# Patient Record
Sex: Male | Born: 1960 | Race: Black or African American | Hispanic: No | Marital: Single
Health system: Midwestern US, Community
[De-identification: ages and names within clinical notes are randomized; demographics above are authoritative.]

## PROBLEM LIST (undated history)

## (undated) DIAGNOSIS — M1990 Unspecified osteoarthritis, unspecified site: Secondary | ICD-10-CM

## (undated) DIAGNOSIS — T7840XA Allergy, unspecified, initial encounter: Secondary | ICD-10-CM

## (undated) DIAGNOSIS — K219 Gastro-esophageal reflux disease without esophagitis: Secondary | ICD-10-CM

## (undated) DIAGNOSIS — I10 Essential (primary) hypertension: Secondary | ICD-10-CM

## (undated) DIAGNOSIS — R1030 Lower abdominal pain, unspecified: Secondary | ICD-10-CM

## (undated) DIAGNOSIS — B1920 Unspecified viral hepatitis C without hepatic coma: Secondary | ICD-10-CM

## (undated) DIAGNOSIS — G8929 Other chronic pain: Secondary | ICD-10-CM

## (undated) DIAGNOSIS — R1011 Right upper quadrant pain: Secondary | ICD-10-CM

## (undated) HISTORY — PX: COLONOSCOPY: SHX174

## (undated) HISTORY — DX: Gastro-esophageal reflux disease without esophagitis: K21.9

## (undated) HISTORY — PX: TESTICLE SURGERY: SHX794

## (undated) HISTORY — DX: Allergy, unspecified, initial encounter: T78.40XA

## (undated) HISTORY — PX: TONSILLECTOMY: SUR1361

## (undated) HISTORY — DX: Essential (primary) hypertension: I10

## (undated) HISTORY — PX: SHOULDER SURGERY: SHX246

## (undated) MED ORDER — CLINDAMYCIN 300 MG CAP
300 mg | ORAL_CAPSULE | Freq: Three times a day (TID) | ORAL | Status: AC
Start: ? — End: 2012-10-28

## (undated) MED ORDER — LISINOPRIL 20 MG TAB: 20 mg | ORAL_TABLET | Freq: Every day | ORAL | Status: AC

## (undated) MED ORDER — DOCUSATE SODIUM 100 MG CAP
100 mg | ORAL_CAPSULE | Freq: Two times a day (BID) | ORAL | Status: AC
Start: ? — End: 2012-08-25

## (undated) MED ORDER — ACETAMINOPHEN 325 MG TABLET: 325 mg | ORAL_TABLET | ORAL | Status: AC | PRN

## (undated) MED ORDER — OXYCODONE 5 MG TAB: 5 mg | ORAL_TABLET | ORAL | Status: AC | PRN

## (undated) MED ORDER — OXYCODONE-ACETAMINOPHEN 5 MG-325 MG TAB: 5-325 mg | ORAL_TABLET | ORAL | Status: AC | PRN

## (undated) MED ORDER — LANSOPRAZOLE 30 MG CAP, DELAYED RELEASE: 30 mg | ORAL_CAPSULE | Freq: Every day | ORAL | Status: AC

## (undated) MED ORDER — LINEZOLID 600 MG TAB
600 mg | ORAL_TABLET | Freq: Two times a day (BID) | ORAL | Status: AC
Start: ? — End: 2012-06-24

---

## 2012-05-26 LAB — CBC WITH AUTOMATED DIFF
ABS. BASOPHILS: 0 10*3/uL (ref 0.0–0.2)
ABS. EOSINOPHILS: 0.1 10*3/uL (ref 0.0–0.7)
ABS. LYMPHOCYTES: 1.7 10*3/uL (ref 1.2–3.4)
ABS. MONOCYTES: 0.7 10*3/uL — ABNORMAL LOW (ref 1.1–3.2)
ABS. NEUTROPHILS: 5.6 10*3/uL (ref 1.4–6.5)
BASOPHILS: 0 % (ref 0–2)
EOSINOPHILS: 1 % (ref 0–5)
HCT: 39.5 % (ref 36.8–45.2)
HGB: 13.3 g/dL (ref 12.8–15.0)
IMMATURE GRANULOCYTES: 0.1 % (ref 0.0–5.0)
LYMPHOCYTES: 21 % (ref 16–40)
MCH: 32.3 PG — ABNORMAL HIGH (ref 27–31)
MCHC: 33.7 g/dL (ref 32–36)
MCV: 95.9 FL (ref 81–99)
MONOCYTES: 8 % (ref 0–12)
MPV: 12.2 FL — ABNORMAL HIGH (ref 7.4–10.4)
NEUTROPHILS: 70 % (ref 40–70)
PLATELET: 122 10*3/uL — ABNORMAL LOW (ref 140–450)
RBC: 4.12 M/uL (ref 4.0–5.2)
RDW: 13.8 % (ref 11.5–14.5)
WBC: 8.1 10*3/uL (ref 4.8–10.8)

## 2012-05-26 LAB — METABOLIC PANEL, COMPREHENSIVE
A-G Ratio: 0.5 — ABNORMAL LOW (ref 1.0–3.1)
ALT (SGPT): 115 U/L — ABNORMAL HIGH (ref 12.0–78.0)
AST (SGOT): 153 U/L — ABNORMAL HIGH (ref 15–37)
Albumin: 2.8 g/dL — ABNORMAL LOW (ref 3.40–5.00)
Alk. phosphatase: 143 U/L — ABNORMAL HIGH (ref 50–136)
Anion gap: 15 mmol/L (ref 10–17)
BUN/Creatinine ratio: 15 (ref 6.0–20.0)
BUN: 17 MG/DL (ref 7–18)
Bilirubin, total: 1.2 MG/DL — ABNORMAL HIGH (ref 0.20–1.00)
CO2: 26 mmol/L (ref 21–32)
Calcium: 9.1 MG/DL (ref 8.5–10.1)
Chloride: 104 mmol/L (ref 98–107)
Creatinine: 1.1 MG/DL (ref 0.6–1.3)
GFR est AA: >60 mL/min/{1.73_m2} (ref >60)
GFR est non-AA: >60 mL/min/{1.73_m2} (ref >60)
Globulin: 5.8 g/dL
Glucose: 123 mg/dL — ABNORMAL HIGH (ref 74–106)
Potassium: 4.6 mmol/L (ref 3.50–5.10)
Protein, total: 8.6 g/dL — ABNORMAL HIGH (ref 6.40–8.20)
Sodium: 140 mmol/L (ref 136–145)

## 2012-05-26 LAB — DRUG SCREEN, URINE
AMPHETAMINES: NEGATIVE
BARBITURATES: NEGATIVE
BENZODIAZEPINES: NEGATIVE
COCAINE: POSITIVE — AB
METHADONE: NEGATIVE
OPIATES: POSITIVE — AB
PCP(PHENCYCLIDINE): NEGATIVE
THC (TH-CANNABINOL): NEGATIVE
TRICYCLICS: NEGATIVE

## 2012-05-26 LAB — SED RATE, AUTOMATED: Sed rate, automated: 50 mm/hr

## 2012-05-26 LAB — GLUCOSE, POC: Glucose (POC): 149 mg/dL — ABNORMAL HIGH (ref 74–106)

## 2012-05-26 MED ADMIN — oxyCODONE IR (ROXICODONE) tablet 5 mg: ORAL | @ 23:00:00 | NDC 00406055223

## 2012-05-26 MED ADMIN — ceFAZolin (ANCEF) 1 g in 0.9% sodium chloride (MBP/ADV) 50 mL MBP: INTRAVENOUS | NDC 63323023710

## 2012-05-26 MED ADMIN — ketorolac (TORADOL) injection 30 mg: INTRAVENOUS | @ 23:00:00 | NDC 00409379501

## 2012-05-26 MED ADMIN — HYDROcodone-acetaminophen (NORCO) 5-325 mg per tablet 1 Tab: ORAL | @ 22:00:00 | NDC 63739053210

## 2012-05-26 MED ADMIN — sodium chloride (NS) flush 5-10 mL: INTRAVENOUS | @ 20:00:00 | NDC 87701099893

## 2012-05-26 NOTE — ED Provider Notes (Signed)
Patient is a 52 y.o. male presenting with back pain. The history is provided by the patient and medical records.   Back Pain   This is a chronic problem. Episode onset: worsened today while running from the police. The pain is associated with no known injury. The pain is present in the lower back. The quality of the pain is described as sharp. The pain radiates to the right thigh. The pain is at a severity of 10/10. The pain is severe. Pertinent negatives include no chest pain, no fever, no headaches, no abdominal pain, no dysuria and no weakness.    Pt was brought in by the police as he complained of back pain after he was running from them after he was allegedly caught stealing. Pt states that he "can't walk" despite multiple witnesses of him running. He ran out of percocet and is using IV heroin. He did not take his meds today    Past Medical History   Diagnosis Date   ??? Hypertension    ??? Diabetes    ??? Other ill-defined conditions      osteomyelitis        History reviewed. No pertinent past surgical history.      Family History   Problem Relation Age of Onset   ??? Diabetes Other    ??? Hypertension Other         History     Social History   ??? Marital Status: SINGLE     Spouse Name: N/A     Number of Children: N/A   ??? Years of Education: N/A     Occupational History   ??? Not on file.     Social History Main Topics   ??? Smoking status: Current Every Day Smoker   ??? Smokeless tobacco: Never Used   ??? Alcohol Use: Yes   ??? Drug Use: Yes     Special: Cocaine, Heroin      Comment: IVDA   ??? Sexually Active: No     Other Topics Concern   ??? Not on file     Social History Narrative   ??? No narrative on file                  ALLERGIES: Review of patient's allergies indicates not on file.      Review of Systems   Constitutional: Negative for fever, diaphoresis and appetite change (good appetite - pt requested food).   HENT: Negative.  Negative for congestion, sore throat and rhinorrhea.    Respiratory: Negative.  Negative for cough,  chest tightness and shortness of breath.    Cardiovascular: Negative.  Negative for chest pain and leg swelling.   Gastrointestinal: Positive for vomiting (once this AM). Negative for nausea, abdominal pain and diarrhea.   Genitourinary: Negative.  Negative for dysuria and difficulty urinating.   Musculoskeletal: Positive for back pain. Negative for joint swelling.   Skin: Negative.  Negative for rash and wound.   Allergic/Immunologic: Positive for immunocompromised state (hep C). Negative for food allergies.   Neurological: Negative.  Negative for weakness and headaches.       Filed Vitals:    05/26/12 1500 05/26/12 1830   BP: 160/87 147/84   Pulse: 67 71   Temp: 98 ??F (36.7 ??C) 98.3 ??F (36.8 ??C)   Resp: 20 20   Height: 6' (1.829 m)    Weight: 90.719 kg (200 lb)    SpO2: 99% 100%  Physical Exam   Nursing note and vitals reviewed.  Constitutional: He is oriented to person, place, and time. He appears well-developed and well-nourished. No distress.   HENT:   Head: Normocephalic and atraumatic.   Mouth/Throat: Oropharynx is clear and moist.   Eyes: Conjunctivae are normal. Pupils are equal, round, and reactive to light.   Neck: Normal range of motion. Neck supple.   Cardiovascular: Normal rate, regular rhythm and normal heart sounds.  Exam reveals no gallop and no friction rub.    No murmur heard.  Pulmonary/Chest: Effort normal and breath sounds normal. No respiratory distress. He has no wheezes. He has no rales. He exhibits no tenderness.   Abdominal: Soft. He exhibits no distension and no mass. There is no tenderness. There is no rebound and no guarding.   + lower back tenderness, diffuse mostly to the Left side of the spine . No swelling   Musculoskeletal: Normal range of motion. He exhibits no edema and no tenderness.   Neurological: He is alert and oriented to person, place, and time. He has normal strength. No cranial nerve deficit.   Power 5/5 lower extremities.  No decreased sensation  Patellar  reflexes +1 bilaterally (symmetrical)  Dorsiflexion of big toe intact bilaterally     Skin: Skin is warm and dry. No rash noted.        MDM     Differential Diagnosis; Clinical Impression; Plan:     Records reviewed from [facility]: Pt has had multiple admissions to [facility] and [facility] for osteomyeilits L4-5 and MSSA bacteremia, who would frequently leave AMA, elope or be  Administratively DCed due to his behaviors. He was finally DCed from [facility] on Zyvox (which they gave him a full 6 week supply of)  Last Bld Cx is 1/30 which shows S Aureus x2, susceptible to cefazolin among other ABx. An MRI shows improving lucency and increased segment over L4-5  (osteo and a possible paraspinal abscess). Last ESR was 26. Last few Bld Cx were negative.  (a copy of all these records is in the paper chart)  Pt states that he doesn't know where the meds are now. He did not follow up with [facility] as he was supposed to last week. The patient needs IV Abx and to have an arrangement for outpatient management. He has no acute neurological deficit that necessitates an emergent MRI  Case was discussed with Dr Dwyane Dee at 18:40 PM and he advises to start cefazolin 1g IV q8h.     19:05 Pm Dr Doneta Public was contacted - pt for admission    Labs Reviewed  CBC WITH AUTOMATED DIFF - Abnormal; Notable for the following:      MCH                           32.3 (*)               PLATELET                      122 (*)                MPV                           12.2 (*)               ABS. MONOCYTES  0.7 (*)             All other components within normal limits  METABOLIC PANEL, COMPREHENSIVE - Abnormal; Notable for the following:      Glucose                       123 (*)                Bilirubin, total              1.2 (*)                ALT                           115 (*)                AST                           153 (*)                Alk. phosphatase              143 (*)                Protein, total                8.6 (*)                 Albumin                       2.8 (*)                A-G Ratio                     0.5 (*)             All other components within normal limits  DRUG SCREEN, URINE - Abnormal; Notable for the following:      COCAINE                       POSITIVE (*)               OPIATES                       POSITIVE (*)            All other components within normal limits  GLUCOSE, POC - Abnormal; Notable for the following:      Glucose (POC)                 149 (*)             All other components within normal limits  CULTURE, BLOOD  CULTURE, BLOOD  CULTURE, BLOOD, PAIRED  SED RATE, AUTOMATED  C REACTIVE PROTEIN, QT  ETHYL ALCOHOL  ETHYL ALCOHOL          Procedures

## 2012-05-26 NOTE — H&P (Addendum)
History & Physical    Primary Care Provider: None  Source of Information:     History of Presenting Illness:     Walter Cox is a 52 y.o. 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 Patient treated at [facility] and at [facility] for the same complaints. He was D/C on Zyvox which was not filled. Today patient was placed under arrest for stealing. During the arrest patient C/O back pain and admitted to not getting his antibiotics filled. Patient also states that he is non-compliant with all his medications.Denies fever and chills, nausea or vomiting. 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     Review of Systems:  Further 10 point review of systems is benign.  A comprehensive review of systems was negative except for that written in the History of Present Illness.     Past Medical History   Diagnosis Date   ??? Hypertension    ??? Diabetes    ??? Other ill-defined conditions      osteomyelitis      History reviewed. No pertinent past surgical history.  Prior to Admission medications    Not on File     Not on File   Family History   Problem Relation Age of Onset   ??? Diabetes Other    ??? Hypertension Other         SOCIAL HISTORY:  History     Social History   ??? Marital Status: SINGLE     Spouse Name: N/A     Number of Children: N/A   ??? Years of Education: N/A     Social History Main Topics   ??? Smoking status: Current Every Day Smoker   ??? Smokeless tobacco: Never Used   ??? Alcohol Use: Yes   ??? Drug Use: Yes     Special: Cocaine, Heroin      Comment: IVDA   ??? Sexually Active: No     Other Topics Concern   ??? Not on file     Social History Narrative   ??? No narrative on file         Objective:       Physical Exam:     BP 147/84   Pulse 71   Temp(Src) 98.3 ??F (36.8 ??C)   Resp 20   Ht 6' (1.829 m)   Wt 90.719 kg (200 lb)   BMI 27.12 kg/m2   SpO2 100%   O2 Device: Room air    General:  Alert, cooperative, no distress, appears stated age.   Head:  Normocephalic, without obvious abnormality,  atraumatic.   Eyes:  Conjunctivae/corneas clear. PERRL, EOMs intact. Fundi benign   Ears:  Normal TMs and external ear canals both ears.   Nose: Nares normal. Septum midline. Mucosa normal. No drainage or sinus tenderness.   Throat: Lips, mucosa, and tongue normal. Teeth and gums normal.   Neck: Supple, symmetrical, trachea midline, no adenopathy, thyroid: no enlargement/tenderness/nodules, no carotid bruit and no JVD.   Back:   Symmetric, no curvature. ROM normal. No CVA tenderness. tenderness along Lumbar spine   Lungs:   Clear to auscultation bilaterally.   Chest wall:  No tenderness or deformity.   Heart:  Regular rate and rhythm, S1, S2 normal, no murmur, click, rub or gallop.   Abdomen:   Soft, non-tender. Bowel sounds normal. No masses,  No organomegaly.   Genitalia:     Rectal:     Extremities: Extremities normal, atraumatic, no  cyanosis or edema. Old skin graft on left forearm   Pulses: 2+ and symmetric all extremities.   Skin: Skin color, texture, turgor normal. No rashes or lesions   Lymph nodes: Cervical, supraclavicular, and axillary nodes normal.   Neurologic: CNII-XII intact. Normal strength, sensation and reflexes throughout.   Data Review:     24 Hour Results:  Recent Results (from the past 24 hour(s))   GLUCOSE, POC    Collection Time     05/26/12  3:46 PM       Result Value Range    Glucose (POC) 149 (*) 74 - 106 mg/dL    Performed by MAYS LEWIS     CBC WITH AUTOMATED DIFF    Collection Time     05/26/12  4:22 PM       Result Value Range    WBC 8.1  4.8 - 10.8 K/uL    RBC 4.12  4.0 - 5.2 M/uL    HGB 13.3  12.8 - 15.0 g/dL    HCT 45.4  09.8 - 11.9 %    MCV 95.9  81 - 99 FL    MCH 32.3 (*) 27 - 31 PG    MCHC 33.7  32 - 36 g/dL    RDW 14.7  82.9 - 56.2 %    PLATELET 122 (*) 140 - 450 K/uL    MPV 12.2 (*) 7.4 - 10.4 FL    NEUTROPHILS 70  40 - 70 %    LYMPHOCYTES 21  16 - 40 %    MONOCYTES 8  0 - 12 %    EOSINOPHILS 1  0 - 5 %    BASOPHILS 0  0 - 2 %    ABS. NEUTROPHILS 5.6  1.4 - 6.5 K/UL    ABS.  LYMPHOCYTES 1.7  1.2 - 3.4 K/UL    ABS. MONOCYTES 0.7 (*) 1.1 - 3.2 K/UL    ABS. EOSINOPHILS 0.1  0.0 - 0.7 K/UL    ABS. BASOPHILS 0.0  0.0 - 0.2 K/UL    DF AUTOMATED      IMMATURE GRANULOCYTES 0.1  0.0 - 5.0 %   METABOLIC PANEL, COMPREHENSIVE    Collection Time     05/26/12  4:22 PM       Result Value Range    Sodium 140  136 - 145 mmol/L    Potassium 4.6  3.50 - 5.10 mmol/L    Chloride 104  98 - 107 mmol/L    CO2 26  21 - 32 mmol/L    Anion gap 15  10 - 17 mmol/L    Glucose 123 (*) 74 - 106 mg/dL    BUN 17  7 - 18 MG/DL    Creatinine 1.30  0.6 - 1.3 MG/DL    BUN/Creatinine ratio 15  6.0 - 20.0      GFR est AA >60  >60 ml/min/1.78m2    GFR est non-AA >60  >60 ml/min/1.42m2    Calcium 9.1  8.5 - 10.1 MG/DL    Bilirubin, total 1.2 (*) 0.20 - 1.00 MG/DL    ALT 865 (*) 78.4 - 69.6 U/L    AST 153 (*) 15 - 37 U/L    Alk. phosphatase 143 (*) 50 - 136 U/L    Protein, total 8.6 (*) 6.40 - 8.20 g/dL    Albumin 2.8 (*) 2.95 - 5.00 g/dL    Globulin 5.8      A-G Ratio 0.5 (*) 1.0 - 3.1  SED RATE, AUTOMATED    Collection Time     05/26/12  4:22 PM       Result Value Range    Sed rate, automated 50     ETHYL ALCOHOL    Collection Time     05/26/12  4:22 PM       Result Value Range    ALCOHOL(ETHYL),SERUM <3  <3.0 MG/DL   DRUG SCREEN, URINE    Collection Time     05/26/12  7:00 PM       Result Value Range    AMPHETAMINE NEGATIVE       BARBITURATES NEGATIVE   NEGATIVE    BENZODIAZEPINE NEGATIVE       COCAINE POSITIVE (*) NEGATIVE    METHADONE NEGATIVE   NEGATIVE    OPIATES POSITIVE (*) NEGATIVE    PCP(PHENCYCLIDINE) NEGATIVE   NEGATIVE    THC (TH-CANNABINOL) NEGATIVE   NEGATIVE    TRICYCLICS NEGATIVE   NEGATIVE    HDSCOM (NOTE)           Chest x-ray: was negative for infiltrate, effusion, pneumothorax, or wide mediastinum       Assessment:     1. Osteomyelitis    2. Back pain    3. Polysubstance abuse           Plan:     1.Plan: Admit Patient to Dr. Jerline Pain:    See orders.       Signed By: Marcha Solders, MD     May 26, 2012

## 2012-05-26 NOTE — ED Notes (Signed)
Pt medicated for pain reports little relief lab results reviewed by physician awaiting further orders vital signs stable no noted distress

## 2012-05-26 NOTE — Other (Signed)
TRANSFER - IN REPORT:    Verbal report received from [facility] RN(name) on [employer]  being received from [facility]  (unit) for routine progression of care      Report consisted of patient???s Situation, Background, Assessment and   Recommendations(SBAR).     Information from the following report(s) SBAR, Kardex, Intake/Output and Recent Results was reviewed with the receiving nurse.    Opportunity for questions and clarification was provided.      Assessment completed upon patient???s arrival to unit and care assumed.

## 2012-05-26 NOTE — ED Notes (Signed)
Pt states having chronic back pain and being out of his percocet's for about a week pt denies any new trama or injury to back pt sleeping no noted distress awaiting physician evaluation

## 2012-05-26 NOTE — Other (Signed)
NOT Recived from ER written in ER

## 2012-05-26 NOTE — Other (Signed)
TRANSFER - OUT REPORT:    Verbal report given to Angela RN(name) on [employer]  being transferred to [facility] (unit) for routine progression of care       Report consisted of patient???s Situation, Background, Assessment and   Recommendations(SBAR).     Information from the following report(s) SBAR, ED Summary, Intake/Output, MAR, Recent Results and Med Rec Status was reviewed with the receiving nurse.    Opportunity for questions and clarification was provided.

## 2012-05-27 LAB — ETHYL ALCOHOL: ALCOHOL(ETHYL),SERUM: <3 MG/DL (ref <3.0)

## 2012-05-27 LAB — METABOLIC PANEL, COMPREHENSIVE
A-G Ratio: 0.4 — ABNORMAL LOW (ref 1.0–3.1)
ALT (SGPT): 97 U/L — ABNORMAL HIGH (ref 12.0–78.0)
AST (SGOT): 120 U/L — ABNORMAL HIGH (ref 15–37)
Albumin: 2.4 g/dL — ABNORMAL LOW (ref 3.40–5.00)
Alk. phosphatase: 153 U/L — ABNORMAL HIGH (ref 50–136)
Anion gap: 14 mmol/L (ref 10–17)
BUN/Creatinine ratio: 14 (ref 6.0–20.0)
BUN: 18 MG/DL (ref 7–18)
Bilirubin, total: 0.8 MG/DL (ref 0.20–1.00)
CO2: 26 mmol/L (ref 21–32)
Calcium: 8.2 MG/DL — ABNORMAL LOW (ref 8.5–10.1)
Chloride: 105 mmol/L (ref 98–107)
Creatinine: 1.3 MG/DL (ref 0.6–1.3)
GFR est AA: >60 mL/min/{1.73_m2} (ref >60)
GFR est non-AA: >60 mL/min/{1.73_m2} (ref >60)
Globulin: 5.4 g/dL
Glucose: 140 mg/dL — ABNORMAL HIGH (ref 74–106)
Potassium: 4 mmol/L (ref 3.50–5.10)
Protein, total: 7.8 g/dL (ref 6.40–8.20)
Sodium: 141 mmol/L (ref 136–145)

## 2012-05-27 LAB — CBC WITH AUTOMATED DIFF
ABS. BASOPHILS: 0 10*3/uL (ref 0.0–0.2)
ABS. EOSINOPHILS: 0.4 10*3/uL (ref 0.0–0.7)
ABS. LYMPHOCYTES: 2.8 10*3/uL (ref 1.2–3.4)
ABS. MONOCYTES: 0.6 10*3/uL — ABNORMAL LOW (ref 1.1–3.2)
ABS. NEUTROPHILS: 2.2 10*3/uL (ref 1.4–6.5)
BASOPHILS: 1 % (ref 0–2)
EOSINOPHILS: 7 % — ABNORMAL HIGH (ref 0–5)
HCT: 37.9 % (ref 36.8–45.2)
HGB: 12.8 g/dL (ref 12.8–15.0)
IMMATURE GRANULOCYTES: 0 % (ref 0.0–5.0)
LYMPHOCYTES: 46 % — ABNORMAL HIGH (ref 16–40)
MCH: 32.4 PG — ABNORMAL HIGH (ref 27–31)
MCHC: 33.8 g/dL (ref 32–36)
MCV: 95.9 FL (ref 81–99)
MONOCYTES: 10 % (ref 0–12)
MPV: 12.9 FL — ABNORMAL HIGH (ref 7.4–10.4)
NEUTROPHILS: 36 % — ABNORMAL LOW (ref 40–70)
PLATELET: 128 10*3/uL — ABNORMAL LOW (ref 140–450)
RBC: 3.95 M/uL — ABNORMAL LOW (ref 4.0–5.2)
RDW: 13.6 % (ref 11.5–14.5)
WBC: 6 10*3/uL (ref 4.8–10.8)

## 2012-05-27 MED ADMIN — sodium chloride (NS) flush 5-10 mL: INTRAVENOUS | @ 02:00:00 | NDC 87701099893

## 2012-05-27 MED ADMIN — heparin (porcine) injection 5,000 Units: SUBCUTANEOUS | @ 09:00:00 | NDC 25021040201

## 2012-05-27 MED ADMIN — oxyCODONE IR (ROXICODONE) tablet 5 mg: ORAL | @ 14:00:00 | NDC 00406055223

## 2012-05-27 MED ADMIN — dicyclomine (BENTYL) capsule 10 mg: ORAL | @ 03:00:00 | NDC 51079011801

## 2012-05-27 MED ADMIN — sodium chloride (NS) flush 5-10 mL: INTRAVENOUS | @ 03:00:00 | NDC 87701099893

## 2012-05-27 MED ADMIN — famotidine (PF) (PEPCID) 20 mg in sodium chloride 0.9 % 10 mL injection: INTRAVENOUS | @ 03:00:00 | NDC 00409488810

## 2012-05-27 MED ADMIN — sodium chloride (NS) flush 5-10 mL: INTRAVENOUS | @ 09:00:00 | NDC 87701099893

## 2012-05-27 MED ADMIN — ceFAZolin (ANCEF) 1,000 mg in sterile water (preservative free) injection: INTRAVENOUS | @ 10:00:00 | NDC 00409488710

## 2012-05-27 MED ADMIN — ceFAZolin (ANCEF) 1,000 mg in sterile water (preservative free) injection: INTRAVENOUS | @ 02:00:00 | NDC 00409488710

## 2012-05-27 MED ADMIN — heparin (porcine) injection 5,000 Units: SUBCUTANEOUS | @ 03:00:00 | NDC 25021040201

## 2012-05-27 MED ADMIN — ceFAZolin (ANCEF) 1,000 mg in sterile water (preservative free) injection: INTRAVENOUS | @ 08:00:00 | NDC 60505074905

## 2012-05-27 NOTE — Discharge Summary (Signed)
ATTENDING PHYSICIAN: Gustavus Messing, MD    BRIEF MEDICAL HISTORY: Apparently the patient is a 52 year old African  American male with medical history significant for osteomyelitis of lumbar  spine, hypertension, degenerative joint disease, diabetes mellitus, chronic  hepatitis C, IV drug abuse, homeless. He was recently treated at [facility] and at [facility] for the same complaints. He  was discharged on Zyvox which he never filled. The patient was placed on  arrest for stealing. During the arrest, the patient complained of back pain  and admitted to not getting his antibiotics filled. He also reported that  he is noncompliant with his medications. He denied any fever, no chills. No  nausea, no vomiting. He is able to ambulate. No urinary or fecal  incontinence. He admits to continued drug use, that is IVDA. He was  admitted for pain management overnight with IV antibiotics.    PAST MEDICAL HISTORY  1. Osteomyelitis, lumbar spine.  2. Chronic continuous IV drug abuse.  3. Chronic hepatitis C.  4. Hypertension.  5. Diabetes mellitus.  6. Medical noncompliance.  7. Back pain.  8. Chronic degenerative disk disease.    SURGICAL HISTORY: He does not recall.    SOCIAL HISTORY: Smokes about 1 pack of cigarettes a day. He is an IV drug  abuser. Single, unemployed and homeless.    MEDICATIONS ON ADMISSION: He is supposed to be on Zyvox 600 mg p.o. b.i.d.,  but he has not been taking the medication.    ALLERGIES: NO KNOWN DRUG ALLERGIES.    REVIEW OF SYSTEMS  HEENT: He denies any dizziness, no lightheadedness, no headaches, no visual  or hearing impairment.  NECK: No neck pain or neck stiffness.  CARDIOPULMONARY: No chest pain, no shortness of breath, no cough, no  hemoptysis. GASTROINTESTINAL: No nausea, vomiting, diarrhea.  GENITOURINARY: No dysuria, no hematuria.  MUSCULOSKELETAL: Significant for lumbar back pain.  NEUROPSYCHIATRIC: No mood swings, no psychosis. No history of CVA or  seizures.   All other systems reviewed and are negative.    PHYSICAL EXAMINATION  GENERAL: Reveals a well-developed, well-nourished African American male. He  is alert and oriented x3.  VITAL SIGNS: Temperature 98.7, heart rate is 72, respirations 20, blood  pressure 115/87, oxygen saturation is 96% on room air.  HEENT: Head normocephalic, atraumatic. Pupils equal, round, and reactive to  light and accommodation. Extraocular movements intact. Sclerae anicteric.  Conjunctivae clear. Nostrils and ears without any discharge. Dentition is  fair. Oropharynx with no lesions.  EXTREMITIES: Supple. No JVD. No carotid bruits. Trachea midline. No  thyromegaly.  CHEST: With no deformities.  LUNGS: Clear to auscultation bilaterally.  HEART: Normal S1, S2.  ABDOMEN: Positive for bowel sounds, soft, nontender, nondistended. No  organomegaly. EXTREMITIES: No clubbing, no cyanosis, no ecchymosis. Pulses  are 2+.  NEUROLOGIC: The patient is awake, alert, oriented x3, with no focal  neurologic deficits.    LABORATORY AND IMAGING DATA: WBC 6.0, hemoglobin 12.8, hematocrit 37.9,  platelet count is 128. Sodium 141, potassium 4.0, chloride 105, CO2 of 26,  anion gap 14, glucose 140, BUN 18, creatinine 1.3. Urine toxicology screen  positive for opiates. Blood cultures: No growth times 12 hours.    HOSPITAL COURSE  1. Lumbar spine osteomyelitis/back pain. The patient presented via the  emergency department with a complaint of back pain. Apparently he was  diagnosed at [facility] with lumbar spine osteomyelitis. Review  of his MRI from [facility] did confirm this diagnosis. He was  discharged home on Zyvox which he never filled. When he was arrested today  by police secondary to stealing, he started complaining of low back pain  for which he was brought to the emergency department and subsequently  admitted. He was started on cefazolin, pain medication, IV hydration. It is  worth noting that he is able to ambulate independently.  No fecal or urinary  incontinence. No fever, no chills, no leukocytosis. His blood cultures for  the last 12 hours have been negative. Since he is being [facility] the  [facility], he can resume his antibiotics while  incarcerated.  2. Hypertension, suboptimally controlled. The patient restarted on his  blood pressure medications.  3. Diabetes mellitus type 2. Controlled on regular insulin sliding scale,  1800-calorie ADA diet. Accu-Cheks were q.a.c. and at bedtime. Follow  hemoglobin A1c outpatient.  4. Polysubstance abuse with heroin and cocaine. The patient with no  withdrawal symptoms. The patient counseled.  5. Ongoing tobacco use. The patient started on Nicoderm 21 mg patch daily  for smoking cessation.  6. CODE STATUS: FULL CODE.  7. GI prophylaxis was with proton pump inhibitor.  8. DVT prophylaxis: Heparin 5000 units subcutaneously q.8 hours.    DISCHARGE DIAGNOSES  1. Lumbar spine osteomyelitis.  2. Chronic lumbar pain.  3. Degenerative disk disease.  4. Diabetes mellitus type 2.  5. Hypertension.  6. Polysubstance abuse with heroin and cocaine.  7. Medical noncompliance.  8. Ongoing tobacco use.  All of the above diagnoses were present on admission.    DISCHARGE MEDICATIONS  1. Zyvox 600 mg p.o. b.i.d. x4 weeks.  2. OxyContin 10 mg p.o. b.i.d.  3. Oxycodone 5 mg p.o. q.6 h. p.r.n. pain.  4. Regular insulin sliding scale.  5. Metformin 500 mg p.o. b.i.d.  6. Lisinopril 10 mg p.o. daily.    DIET: An 1800-calorie ADA diet.    ACTIVITY: As tolerated.    DISPOSITION: The patient discharged to [facility].        Electronically viewed and signed by Marguarite Arbour Kaulana Brindle, PA-C on 05/29/2012  04:24 P.        Dictated by: Adela Glimpse Reia Viernes]  Job#: [[id number]]  DD: [05/27/2012 09:22 A]  DT: [05/27/2012 09:58 A]  Transcribed by: [wmx]    cc:            Yvonne Kendall                 Myrtie Neither, MD

## 2012-05-27 NOTE — Other (Signed)
Bedside and Verbal shift change report given to Alade (oncoming nurse) by Mariea Clonts, RN   (offgoing nurse).  Report given with SBAR, Kardex and MAR.

## 2012-05-27 NOTE — Progress Notes (Signed)
Admitted to rm 254, arrived via stretcher, under police custody, left hand-cuffed. A/Ox3, pt said he is on methadone program @ [facility], 70mg  daily, the last time he took it was last Friday. 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 IV Cefazolin initially given in ED @ 1741. 1 police officer in the room.

## 2012-05-27 NOTE — Progress Notes (Signed)
Received patient at 0700, AOx3. Received pain medication with good effect. On 1:1 constant observation with the police. Discharged to correctional facility at 09:20. Patient ambulated off the floor in hand cuffs with a shuffling gait. Personal effects given to patient. Discharge instructions given.

## 2012-05-27 NOTE — Progress Notes (Signed)
DISCHARGE NOTE: SW aware that patient was escorted off the unit this morning by [facility].         Blanche East, LCSW-C

## 2012-05-27 NOTE — Progress Notes (Signed)
Hospitalist Progress Note               Daily Progress Note: 05/27/2012      Subjective:   Walter Cox is a 52 y.o. 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 Patient treated at [facility] and at [facility] for the same complaints. He was D/C on Zyvox which was not filled. Today patient was placed under arrest for stealing. During the arrest patient C/O back pain and admitted to not getting his antibiotics filled. Patient also states that he is non-compliant with all his medications.Denies fever and chills, nausea or vomiting. 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       Acute complaints: Still with c/o Back pain  No overnight events. Nursing notes reviewed.  Denies any nausea, vomiting, constipation, diarrhea.  Denies any chest pain, palpitations, headache, or SOB.    Review of Systems:   A comprehensive review of systems was negative except for that written in the HPI.    Objective:   Physical Exam:     BP 150/87   Pulse 72   Temp(Src) 98.7 ??F (37.1 ??C)   Resp 20   Ht 6' (1.829 m)   Wt 90.719 kg (200 lb)   BMI 27.12 kg/m2   SpO2 96%   O2 Device: Room air    Temp (24hrs), Avg:98.4 ??F (36.9 ??C), Min:98 ??F (36.7 ??C), Max:98.9 ??F (37.2 ??C)        04/13 1900 - 04/15 0659  In: 700 [P.O.:600; I.V.:100]  Out: -     General:   HEENT:  No acute distress. Appears stated age.  NC/AT. Neck supple. Trachea midline. PEERLA, EOMI, no icterus.   Lungs:    Clear to auscultation bilaterally. No wheezing/rales/rhonchi. No accessory muscle use. No tachypnea.   Chest wall:   No tenderness or deformity.   Heart:  Regular rate and rhythm, S1, S2 normal, no murmurs, clicks, rubs or gallops. No JVD. No carotid bruits.    Abdomen:   Soft, non-tender/non-distended. Bowel sounds normal. No masses,  No organomegaly. No guarding or rigidity   Extremities: Extremities normal, atraumatic, no cyanosis or edema.   Pulses: 2+ and symmetric all extremities.   Skin: Skin  color, texture, turgor normal. No rashes or lesions   Neurologic:    Other: CNII-XII intact. No focal motor or sensory deficits appreciated. AAOx3.       Data Review:       Recent Days:  Recent Labs      05/27/12   0500  05/26/12   1622   WBC  6.0  8.1   HGB  12.8  13.3   HCT  37.9  39.5   PLT  128*  122*     Recent Labs      05/27/12   0500  05/26/12   1622   NA  141  140   K  4.0  4.6   CL  105  104   CO2  26  26   GLU  140*  123*   BUN  18  17   CREA  1.30  1.10   CA  8.2*  9.1   ALB  2.4*  2.8*   TBILI  0.8  1.2*   SGOT  120*  153*   ALT  97*  115*     No results found for this basename: PH, PCO2, PO2, HCO3, FIO2,  in the last 72 hours  24 Hour Results:  Recent Results (from the past 24 hour(s))   GLUCOSE, POC    Collection Time     05/26/12  3:46 PM       Result Value Range    Glucose (POC) 149 (*) 74 - 106 mg/dL    Performed by MAYS LEWIS     CBC WITH AUTOMATED DIFF    Collection Time     05/26/12  4:22 PM       Result Value Range    WBC 8.1  4.8 - 10.8 K/uL    RBC 4.12  4.0 - 5.2 M/uL    HGB 13.3  12.8 - 15.0 g/dL    HCT 96.0  45.4 - 09.8 %    MCV 95.9  81 - 99 FL    MCH 32.3 (*) 27 - 31 PG    MCHC 33.7  32 - 36 g/dL    RDW 11.9  14.7 - 82.9 %    PLATELET 122 (*) 140 - 450 K/uL    MPV 12.2 (*) 7.4 - 10.4 FL    NEUTROPHILS 70  40 - 70 %    LYMPHOCYTES 21  16 - 40 %    MONOCYTES 8  0 - 12 %    EOSINOPHILS 1  0 - 5 %    BASOPHILS 0  0 - 2 %    ABS. NEUTROPHILS 5.6  1.4 - 6.5 K/UL    ABS. LYMPHOCYTES 1.7  1.2 - 3.4 K/UL    ABS. MONOCYTES 0.7 (*) 1.1 - 3.2 K/UL    ABS. EOSINOPHILS 0.1  0.0 - 0.7 K/UL    ABS. BASOPHILS 0.0  0.0 - 0.2 K/UL    DF AUTOMATED      IMMATURE GRANULOCYTES 0.1  0.0 - 5.0 %   METABOLIC PANEL, COMPREHENSIVE    Collection Time     05/26/12  4:22 PM       Result Value Range    Sodium 140  136 - 145 mmol/L    Potassium 4.6  3.50 - 5.10 mmol/L    Chloride 104  98 - 107 mmol/L    CO2 26  21 - 32 mmol/L    Anion gap 15  10 - 17 mmol/L    Glucose 123 (*) 74 - 106 mg/dL    BUN 17  7 - 18 MG/DL     Creatinine 5.62  0.6 - 1.3 MG/DL    BUN/Creatinine ratio 15  6.0 - 20.0      GFR est AA >60  >60 ml/min/1.64m2    GFR est non-AA >60  >60 ml/min/1.17m2    Calcium 9.1  8.5 - 10.1 MG/DL    Bilirubin, total 1.2 (*) 0.20 - 1.00 MG/DL    ALT 130 (*) 86.5 - 78.4 U/L    AST 153 (*) 15 - 37 U/L    Alk. phosphatase 143 (*) 50 - 136 U/L    Protein, total 8.6 (*) 6.40 - 8.20 g/dL    Albumin 2.8 (*) 6.96 - 5.00 g/dL    Globulin 5.8      A-G Ratio 0.5 (*) 1.0 - 3.1     SED RATE, AUTOMATED    Collection Time     05/26/12  4:22 PM       Result Value Range    Sed rate, automated 50     ETHYL ALCOHOL    Collection Time     05/26/12  4:22 PM  Result Value Range    ALCOHOL(ETHYL),SERUM <3  <3.0 MG/DL   DRUG SCREEN, URINE    Collection Time     05/26/12  7:00 PM       Result Value Range    AMPHETAMINE NEGATIVE       BARBITURATES NEGATIVE   NEGATIVE    BENZODIAZEPINE NEGATIVE       COCAINE POSITIVE (*) NEGATIVE    METHADONE NEGATIVE   NEGATIVE    OPIATES POSITIVE (*) NEGATIVE    PCP(PHENCYCLIDINE) NEGATIVE   NEGATIVE    THC (TH-CANNABINOL) NEGATIVE   NEGATIVE    TRICYCLICS NEGATIVE   NEGATIVE    HDSCOM (NOTE)     METABOLIC PANEL, COMPREHENSIVE    Collection Time     05/27/12  5:00 AM       Result Value Range    Sodium 141  136 - 145 mmol/L    Potassium 4.0  3.50 - 5.10 mmol/L    Chloride 105  98 - 107 mmol/L    CO2 26  21 - 32 mmol/L    Anion gap 14  10 - 17 mmol/L    Glucose 140 (*) 74 - 106 mg/dL    BUN 18  7 - 18 MG/DL    Creatinine 1.61  0.6 - 1.3 MG/DL    BUN/Creatinine ratio 14  6.0 - 20.0      GFR est AA >60  >60 ml/min/1.60m2    GFR est non-AA >60  >60 ml/min/1.46m2    Calcium 8.2 (*) 8.5 - 10.1 MG/DL    Bilirubin, total 0.8  0.20 - 1.00 MG/DL    ALT 97 (*) 09.6 - 04.5 U/L    AST 120 (*) 15 - 37 U/L    Alk. phosphatase 153 (*) 50 - 136 U/L    Protein, total 7.8  6.40 - 8.20 g/dL    Albumin 2.4 (*) 4.09 - 5.00 g/dL    Globulin 5.4      A-G Ratio 0.4 (*) 1.0 - 3.1     CBC WITH AUTOMATED DIFF    Collection Time     05/27/12   5:00 AM       Result Value Range    WBC 6.0  4.8 - 10.8 K/uL    RBC 3.95 (*) 4.0 - 5.2 M/uL    HGB 12.8  12.8 - 15.0 g/dL    HCT 81.1  91.4 - 78.2 %    MCV 95.9  81 - 99 FL    MCH 32.4 (*) 27 - 31 PG    MCHC 33.8  32 - 36 g/dL    RDW 95.6  21.3 - 08.6 %    PLATELET 128 (*) 140 - 450 K/uL    MPV 12.9 (*) 7.4 - 10.4 FL    NEUTROPHILS 36 (*) 40 - 70 %    LYMPHOCYTES 46 (*) 16 - 40 %    MONOCYTES 10  0 - 12 %    EOSINOPHILS 7 (*) 0 - 5 %    BASOPHILS 1  0 - 2 %    ABS. NEUTROPHILS 2.2  1.4 - 6.5 K/UL    ABS. LYMPHOCYTES 2.8  1.2 - 3.4 K/UL    ABS. MONOCYTES 0.6 (*) 1.1 - 3.2 K/UL    ABS. EOSINOPHILS 0.4  0.0 - 0.7 K/UL    ABS. BASOPHILS 0.0  0.0 - 0.2 K/UL    DF AUTOMATED      IMMATURE GRANULOCYTES 0.0  0.0 - 5.0 %  Problem List:  Problem List as of 05/27/2012 Date Reviewed: 05/27/2012        ICD-9-CM Class Noted - Resolved    DM (diabetes mellitus) (Chronic) 250.00  05/27/2012 - Present        HTN (hypertension) (Chronic) 401.9  05/27/2012 - Present        *Osteomyelitis 730.20  05/27/2012 - Present        Polysubstance abuse (Chronic) 305.90  05/27/2012 - Present              Medications reviewed  Current Facility-Administered Medications   Medication Dose Route Frequency   ??? sodium chloride (NS) flush 5-10 mL  5-10 mL IntraVENous Q8H   ??? sodium chloride (NS) flush 5-10 mL  5-10 mL IntraVENous PRN   ??? [COMPLETED] HYDROcodone-acetaminophen (NORCO) 5-325 mg per tablet 1 Tab  1 Tab Oral NOW   ??? oxyCODONE IR (ROXICODONE) tablet 5 mg  5 mg Oral Q4H PRN   ??? [COMPLETED] ketorolac (TORADOL) injection 30 mg  30 mg IntraVENous NOW   ??? [COMPLETED] ceFAZolin (ANCEF) 1 g in 0.9% sodium chloride (MBP/ADV) 50 mL MBP  1 g IntraVENous NOW   ??? sodium chloride (NS) flush 5-10 mL  5-10 mL IntraVENous Q8H   ??? sodium chloride (NS) flush 5-10 mL  5-10 mL IntraVENous PRN   ??? acetaminophen (TYLENOL) tablet 650 mg  650 mg Oral Q4H PRN   ??? oxyCODONE-acetaminophen (PERCOCET) 5-325 mg per tablet 1 Tab  1 Tab Oral Q4H PRN   ??? HYDROmorphone (PF)  (DILAUDID) injection 1 mg  1 mg IntraVENous Q4H PRN   ??? dicyclomine (BENTYL) capsule 10 mg  10 mg Oral Q6H PRN   ??? heparin (porcine) injection 5,000 Units  5,000 Units SubCUTAneous Q8H   ??? [COMPLETED] famotidine (PF) (PEPCID) 20 mg in sodium chloride 0.9 % 10 mL injection  20 mg IntraVENous NOW   ??? ceFAZolin (ANCEF) 1,000 mg in sterile water (preservative free) injection  1,000 mg IntraVENous Q8H       Care Plan discussed with: Patient/Family, Nurse and Case Manager in detail. Expressed understanding and is in agreement.    Lifestyle modifications including diet, exercise, medical compliance and importance of outpatient medical followup was discussed.  Patient has also been seen by the Nutrition team.      Assessment/Plan:   1. 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 Findings are likely post infectious in origin. Patient was d/c home on Zyvox which he never filled.Will resume Vancomycin per Pharm D dosing. Continue Abx.    2) HTN: Controlled    3) DM, Controlled    4)Medical Non Complaince, counseled    Dispo: Discharged to [facility]. Will have Abx at [facility].             All of the above diagnoses were present on admission.         Disposition:   Expected length of stay: 1 days.      Total time spent with patient: 30 minutes.    Anabel Lykins A Charmayne Odell, PA @TODAYDATE @ 8:45 AM

## 2012-05-28 LAB — C REACTIVE PROTEIN, QT: C-Reactive Protein, Qt: 0.2 mg/L (ref 0.0–4.9)

## 2012-05-31 LAB — CULTURE, BLOOD
Culture result:: NO GROWTH
Culture result:: NO GROWTH

## 2012-06-18 LAB — METABOLIC PANEL, COMPREHENSIVE
A-G Ratio: 0.6 — ABNORMAL LOW (ref 1.0–3.1)
ALT (SGPT): 95 U/L — ABNORMAL HIGH (ref 12.0–78.0)
AST (SGOT): 72 U/L — ABNORMAL HIGH (ref 15–37)
Albumin: 2.6 g/dL — ABNORMAL LOW (ref 3.40–5.00)
Alk. phosphatase: 131 U/L (ref 50–136)
Anion gap: 17 mmol/L (ref 10–17)
BUN/Creatinine ratio: 17 (ref 6.0–20.0)
BUN: 15 MG/DL (ref 7–18)
Bilirubin, total: 0.7 MG/DL (ref 0.20–1.00)
CO2: 22 mmol/L (ref 21–32)
Calcium: 7.2 MG/DL — ABNORMAL LOW (ref 8.5–10.1)
Chloride: 105 mmol/L (ref 98–107)
Creatinine: 0.9 MG/DL (ref 0.6–1.3)
GFR est AA: >60 mL/min/{1.73_m2} (ref >60)
GFR est non-AA: >60 mL/min/{1.73_m2} (ref >60)
Globulin: 4.4 g/dL
Glucose: 102 mg/dL (ref 74–106)
Potassium: 3.6 mmol/L (ref 3.50–5.10)
Protein, total: 7 g/dL (ref 6.40–8.20)
Sodium: 140 mmol/L (ref 136–145)

## 2012-06-18 LAB — CBC WITH AUTOMATED DIFF
ABS. BASOPHILS: 0.1 10*3/uL (ref 0.0–0.2)
ABS. EOSINOPHILS: 0.5 10*3/uL (ref 0.0–0.7)
ABS. LYMPHOCYTES: 2.7 10*3/uL (ref 1.2–3.4)
ABS. MONOCYTES: 0.7 10*3/uL — ABNORMAL LOW (ref 1.1–3.2)
ABS. NEUTROPHILS: 2.6 10*3/uL (ref 1.4–6.5)
BASOPHILS: 1 % (ref 0–2)
EOSINOPHILS: 8 % — ABNORMAL HIGH (ref 0–5)
HCT: 42.4 % (ref 36.8–45.2)
HGB: 14.5 g/dL (ref 12.8–15.0)
IMMATURE GRANULOCYTES: 0.3 % (ref 0.0–5.0)
LYMPHOCYTES: 40 % (ref 16–40)
MCH: 32.4 PG — ABNORMAL HIGH (ref 27–31)
MCHC: 34.2 g/dL (ref 32–36)
MCV: 94.6 FL (ref 81–99)
MONOCYTES: 11 % (ref 0–12)
MPV: 14.3 FL — ABNORMAL HIGH (ref 7.4–10.4)
NEUTROPHILS: 40 % (ref 40–70)
PLATELET: 110 10*3/uL — ABNORMAL LOW (ref 140–450)
RBC: 4.48 M/uL (ref 4.0–5.2)
RDW: 13.7 % (ref 11.5–14.5)
WBC: 6.5 10*3/uL (ref 4.8–10.8)

## 2012-06-18 LAB — PROTHROMBIN TIME + INR
INR: 1.1
Prothrombin time: 12 s (ref 9.8–12.0)
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
## 2012-10-17 LAB — METABOLIC PANEL, COMPREHENSIVE
A-G Ratio: 0.6 — ABNORMAL LOW (ref 1.0–3.1)
A-G Ratio: 0.7 — ABNORMAL LOW (ref 1.0–3.1)
ALT (SGPT): 148 U/L — ABNORMAL HIGH (ref 12.0–78.0)
ALT (SGPT): 134 U/L — ABNORMAL HIGH (ref 12.0–78.0)
AST (SGOT): 141 U/L — ABNORMAL HIGH (ref 15–37)
AST (SGOT): 129 U/L — ABNORMAL HIGH (ref 15–37)
Albumin: 3.3 g/dL — ABNORMAL LOW (ref 3.40–5.00)
Albumin: 3.1 g/dL — ABNORMAL LOW (ref 3.40–5.00)
Alk. phosphatase: 226 U/L — ABNORMAL HIGH (ref 46–116)
Alk. phosphatase: 216 U/L — ABNORMAL HIGH (ref 46–116)
Anion gap: 17 mmol/L (ref 10–17)
Anion gap: 15 mmol/L (ref 10–17)
BUN/Creatinine ratio: 13 (ref 6.0–20.0)
BUN/Creatinine ratio: 12 (ref 6.0–20.0)
BUN: 13 MG/DL (ref 7–18)
BUN: 12 MG/DL (ref 7–18)
Bilirubin, total: 1.2 MG/DL — ABNORMAL HIGH (ref 0.20–1.00)
Bilirubin, total: 0.7 MG/DL (ref 0.20–1.00)
CO2: 23 mmol/L (ref 21–32)
CO2: 25 mmol/L (ref 21–32)
Calcium: 8.7 MG/DL (ref 8.5–10.1)
Calcium: 8.7 MG/DL (ref 8.5–10.1)
Chloride: 101 mmol/L (ref 98–107)
Chloride: 105 mmol/L (ref 98–107)
Creatinine: 1 MG/DL (ref 0.6–1.3)
Creatinine: 1 MG/DL (ref 0.6–1.3)
GFR est AA: >60 mL/min/{1.73_m2} (ref >60)
GFR est AA: >60 mL/min/{1.73_m2} (ref >60)
GFR est non-AA: >60 mL/min/{1.73_m2} (ref >60)
GFR est non-AA: >60 mL/min/{1.73_m2} (ref >60)
Globulin: 5.1 g/dL
Globulin: 4.7 g/dL
Glucose: 362 mg/dL — ABNORMAL HIGH (ref 74–106)
Glucose: 312 mg/dL — ABNORMAL HIGH (ref 74–106)
Potassium: 4.1 mmol/L (ref 3.50–5.10)
Potassium: 4.2 mmol/L (ref 3.50–5.10)
Protein, total: 8.4 g/dL — ABNORMAL HIGH (ref 6.40–8.20)
Protein, total: 7.8 g/dL (ref 6.40–8.20)
Sodium: 137 mmol/L (ref 136–145)
Sodium: 141 mmol/L (ref 136–145)

## 2012-10-17 LAB — EKG, 12 LEAD, INITIAL
Atrial Rate: 87 {beats}/min
Calculated P Axis: 65 degrees
Calculated R Axis: -14 degrees
Calculated T Axis: 40 degrees
P-R Interval: 136 ms
Q-T Interval: 430 ms
QRS Duration: 142 ms
QTC Calculation (Bezet): 517 ms
Ventricular Rate: 87 {beats}/min

## 2012-10-17 LAB — GLUCOSE, POC
Glucose (POC): 221 mg/dL — ABNORMAL HIGH (ref 74–106)
Glucose (POC): 181 mg/dL — ABNORMAL HIGH (ref 74–106)

## 2012-10-17 LAB — CBC WITH AUTOMATED DIFF
ABS. BASOPHILS: 0.1 10*3/uL (ref 0.0–0.2)
ABS. BASOPHILS: 0 10*3/uL (ref 0.0–0.2)
ABS. EOSINOPHILS: 0.4 10*3/uL (ref 0.0–0.7)
ABS. EOSINOPHILS: 0.4 10*3/uL (ref 0.0–0.7)
ABS. IMM. GRANS.: 0 10*3/uL (ref 0.0–0.5)
ABS. LYMPHOCYTES: 3.8 10*3/uL — ABNORMAL HIGH (ref 1.2–3.4)
ABS. LYMPHOCYTES: 2.7 10*3/uL (ref 1.2–3.4)
ABS. MONOCYTES: 0.9 10*3/uL — ABNORMAL LOW (ref 1.1–3.2)
ABS. MONOCYTES: 1.2 10*3/uL (ref 1.1–3.2)
ABS. NEUTROPHILS: 4.8 10*3/uL (ref 1.4–6.5)
ABS. NEUTROPHILS: 3.9 10*3/uL (ref 1.4–6.5)
BASOPHILS: 1 % (ref 0–2)
BASOPHILS: 1 % (ref 0–2)
EOSINOPHILS: 4 % (ref 0–5)
EOSINOPHILS: 4 % (ref 0–5)
HCT: 45.7 % — ABNORMAL HIGH (ref 36.8–45.2)
HCT: 45.4 % — ABNORMAL HIGH (ref 36.8–45.2)
HGB: 16.4 g/dL — ABNORMAL HIGH (ref 12.8–15.0)
HGB: 15.8 g/dL — ABNORMAL HIGH (ref 12.8–15.0)
IMMATURE GRANULOCYTES: 0.1 % (ref 0.0–5.0)
IMMATURE GRANULOCYTES: 0.2 % (ref 0.0–5.0)
LYMPHOCYTES: 38 % (ref 16–40)
LYMPHOCYTES: 33 % (ref 16–40)
MCH: 32.5 PG — ABNORMAL HIGH (ref 27–31)
MCH: 31.9 PG — ABNORMAL HIGH (ref 27–31)
MCHC: 35.9 g/dL (ref 32–36)
MCHC: 34.8 g/dL (ref 32–36)
MCV: 90.5 FL (ref 81–99)
MCV: 91.5 FL (ref 81–99)
MONOCYTES: 9 % (ref 0–12)
MONOCYTES: 14 % — ABNORMAL HIGH (ref 0–12)
MPV: 13.7 FL — ABNORMAL HIGH (ref 7.4–10.4)
NEUTROPHILS: 48 % (ref 40–70)
NEUTROPHILS: 48 % (ref 40–70)
NRBC: 0 PER 100 WBC
PLATELET: 70 10*3/uL — ABNORMAL LOW (ref 140–450)
PLATELET: 81 10*3/uL — ABNORMAL LOW (ref 140–450)
RBC: 5.05 M/uL (ref 4.0–5.2)
RBC: 4.96 M/uL (ref 4.0–5.2)
RDW: 12.5 % (ref 11.5–14.5)
RDW: 12.5 % (ref 11.5–14.5)
WBC: 10 10*3/uL (ref 4.8–10.8)
WBC: 8.2 10*3/uL (ref 4.8–10.0)

## 2012-10-17 LAB — LIPASE: Lipase: 178 U/L (ref 65–230)

## 2012-10-17 MED ADMIN — lidocaine (XYLOCAINE) 20 mg/mL (2 %) injection: SUBCUTANEOUS | @ 19:00:00 | NDC 00409427701

## 2012-10-17 MED ADMIN — sodium chloride (NS) flush 5-10 mL: INTRAVENOUS | @ 10:00:00 | NDC 87701099893

## 2012-10-17 MED ADMIN — vancomycin (VANCOCIN) 1,250 mg in dextrose 5% 250 mL IVPB: INTRAVENOUS | @ 19:00:00 | NDC 00409792202

## 2012-10-17 MED ADMIN — electrolyte-A infusion 1,000 mL: INTRAVENOUS | @ 07:00:00 | NDC 00338022104

## 2012-10-17 MED ADMIN — lactated ringers infusion: INTRAVENOUS | @ 22:00:00 | NDC 00409795309

## 2012-10-17 MED ADMIN — propofol (DIPRIVAN) 10 mg/mL injection: INTRAVENOUS | @ 19:00:00 | NDC 00409469930

## 2012-10-17 MED ADMIN — hydromorphone (PF) (DILAUDID) injection: INTRAVENOUS | @ 20:00:00 | NDC 00409131230

## 2012-10-17 MED ADMIN — lactated ringers infusion: INTRAVENOUS | @ 17:00:00 | NDC 00409795309

## 2012-10-17 MED ADMIN — fentanyl citrate (PF) injection: INTRAVENOUS | @ 19:00:00 | NDC 00409909332

## 2012-10-17 MED ADMIN — lorazepam (ATIVAN) injection 1 mg: INTRAVENOUS | @ 21:00:00 | NDC 00641604401

## 2012-10-17 MED ADMIN — lactated ringers infusion: INTRAVENOUS | @ 15:00:00 | NDC 00409795309

## 2012-10-17 MED ADMIN — sodium chloride (NS) flush 5-10 mL: INTRAVENOUS | @ 17:00:00 | NDC 87701099893

## 2012-10-17 MED ADMIN — vancomycin (VANCOCIN) 1,000 mg in 0.9% sodium chloride 250 mL IVPB: INTRAVENOUS | @ 07:00:00 | NDC 00409653501

## 2012-10-17 MED ADMIN — fentanyl citrate (PF) injection: INTRAVENOUS | @ 20:00:00 | NDC 00409909332

## 2012-10-17 MED ADMIN — oxycodone-acetaminophen (PERCOCET) 5-325 mg per tablet 1 tablet: ORAL | @ 07:00:00 | NDC 68084035511

## 2012-10-17 MED ADMIN — midazolam (VERSED) injection: INTRAVENOUS | @ 19:00:00 | NDC 63323041112

## 2012-10-17 MED ADMIN — ioversol (OPTIRAY) 320 mg iodine/mL contrast injection 50-100 mL: INTRAVENOUS | @ 06:00:00 | NDC 00019132311

## 2012-10-17 MED ADMIN — morphine injection 4 mg: INTRAVENOUS | @ 05:00:00 | NDC 00409189101

## 2012-10-17 MED ADMIN — insulin aspart (NOVOLOG) injection: SUBCUTANEOUS | @ 22:00:00 | NDC 09999750101

## 2012-10-17 MED ADMIN — ondansetron (ZOFRAN) injection 4 mg: INTRAVENOUS | @ 05:00:00 | NDC 00409475503

## 2012-10-17 MED ADMIN — insulin aspart (NOVOLOG) injection 4 Units: SUBCUTANEOUS | @ 17:00:00 | NDC 09999750101

## 2012-10-17 MED ADMIN — diphenhydrAMINE (BENADRYL) injection 12.5 mg: INTRAVENOUS | @ 20:00:00 | NDC 63323066401

## 2012-10-17 NOTE — Other (Signed)
TRANSFER - OUT REPORT:    Verbal report given to Tawanna Cooler, RN on Gildo Crisco  being transferred to [facility] for routine progression of care       Report consisted of patient???s Situation, Background, Assessment and   Recommendations(SBAR).     Information from the following report(s) SBAR, OR Summary, Intake/Output and MAR was reviewed with the receiving nurse.    Opportunity for questions and clarification was provided.      Patient transported with:   Registered Nurse

## 2012-10-17 NOTE — Progress Notes (Signed)
Patient resting. No c/o pain or SOB. Dressing to groin with scrotal support. Will monitor.
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
## 2012-10-17 NOTE — Progress Notes (Signed)
PT IS A NEW ADMISSION FROM THE ER. HAS BEEN NPO SINCE ARRIVAL TO UNIT. S/W DR. Alena Bills, SURGERY AROUND 1300. OFFICERS ORDERED FOR 12PM.

## 2012-10-17 NOTE — Other (Signed)
TRANSFER - OUT REPORT:    Verbal report given to Janet,RN on Alvia Tory  being transferred to [facility] for routine progression of care       Report consisted of patient???s Situation, Background, Assessment and   Recommendations(SBAR).     Information from the following report(s) SBAR, Kardex, ED Summary and MAR was reviewed with the receiving nurse.    Opportunity for questions and clarification was provided.

## 2012-10-17 NOTE — Anesthesia Post-Procedure Evaluation (Signed)
Post-Anesthesia Evaluation and Assessment    Patient: Walter Cox MRN: [id number]  SSN: [id number]    Date of Birth: 26-Oct-1960  Age: 52 y.o.  Sex: male       Cardiovascular Function/Vital Signs  Visit Vitals   Item Reading   ??? BP 137/79   ??? Pulse 76   ??? Temp 36.5 ??C (97.7 ??F)   ??? Resp 20   ??? Ht 5\' 7"  (1.702 m)   ??? Wt 84.5 kg (186 lb 4.6 oz)   ??? BMI 29.17 kg/m2   ??? SpO2 99%       Patient is status post general anesthesia for Procedure(s):  SCROTAL INCISION AND DRAINAGE ABSCESS.    Nausea/Vomiting: None    Postoperative hydration reviewed and adequate.    Pain:  Pain Scale 1: Numeric (0 - 10) (10/17/12 1136)  Pain Intensity 1: 10 (10/17/12 1136)   Managed    Neurological Status:   Neuro (WDL): Within Defined Limits (10/17/12 1206)   At baseline    Mental Status and Level of Consciousness: Sedated    Pulmonary Status:   O2 Device: Nasal cannula (10/17/12 1559)   Adequate oxygenation and airway patent    Complications related to anesthesia: None    Post-anesthesia assessment completed. No concerns    Signed By: Lajean Manes, MD     October 17, 2012

## 2012-10-17 NOTE — Progress Notes (Signed)
Consult for Vancomycin Dosing by Pharmacy by Dr. Alena Bills  Indication: Abscess  Target Level: 10-15   Day of Therapy: 1   Previous Regimen 1000 mg x 1 dose on 10/17/2012 @ 3:17am   Last Level None   Other Current Antibiotics None   Significant Cultures None   Serum Creatinine Lab Results   Component Value Date/Time    Creatinine 1.00 10/17/2012  5:15 AM       Creatinine Clearance Estimated Creatinine Clearance: 92.9 ml/min (based on Cr of 1).   BUN Lab Results   Component Value Date/Time    BUN 12 10/17/2012  5:15 AM       WBC Lab Results   Component Value Date/Time    WBC 8.2 10/17/2012  5:15 AM       H/H Lab Results   Component Value Date/Time    HGB 15.8 10/17/2012  5:15 AM      Platelets Lab Results   Component Value Date/Time    PLATELET 81 10/17/2012  5:15 AM      Temp Temp: 98 ??F (36.7 ??C) ()     Dose administration notes:   Doses given appropriately as scheduled  Plan for level: Not now  Adjustments: None  Plan:  Continue to monitor

## 2012-10-17 NOTE — Progress Notes (Signed)
TRANSFER - IN REPORT:    Verbal report received from  [facility] (name) on Walter Cox  being received from  ER (unit) for routine progression of care      Report consisted of patient???s Situation, Background, Assessment and   Recommendations(SBAR).     Information from the following report(s) SBAR and ED Summary was reviewed with the receiving nurse.    Opportunity for questions and clarification was provided.      Assessment completed upon patient???s arrival to unit and care assumed.

## 2012-10-17 NOTE — Progress Notes (Signed)
NUTRITION note       Nutrition screening referral was triggered based on results obtained during nursing admission assessment.  The patient's chart was reviewed and nutrition assessment is not indicated at this time.  Plan to see patient for rescreen as indicated.  Thanks.       Leah W Gecheo RD, LDN

## 2012-10-17 NOTE — Anesthesia Pre-Procedure Evaluation (Addendum)
Anesthetic History   No history of anesthetic complications           Review of Systems / Medical History  Patient summary reviewed, nursing notes reviewed and pertinent labs reviewed    Pulmonary  Within defined limits               Neuro/Psych   Within defined limits           Cardiovascular  Within defined limits                   GI/Hepatic/Renal       Hepatitis: type C           Endo/Other    Diabetes         Other Findings   Comments: Scrotal abscess         Physical Exam    Airway             Cardiovascular  Regular rate and rhythm,  S1 and S2 normal,  no murmur, click, rub, or gallop  Rhythm: regular  Rate: normal         Dental         Pulmonary  Breath sounds clear to auscultation               Abdominal  Abdominal exam normal       Other Findings            Anesthetic Plan    ASA: 3  Anesthesia type: general          Induction: Inhalational and Intravenous  Anesthetic plan and risks discussed with: Patient

## 2012-10-17 NOTE — Brief Op Note (Signed)
BRIEF OPERATIVE NOTE    Date of Procedure: 10/17/2012   Preoperative Diagnosis: scrotal abscess  Postoperative Diagnosis: scrotal abscess    Procedure(s):  SCROTAL INCISION AND DRAINAGE ABSCESS  Surgeon(s) and Role:     * Ulyess Blossom, MD - Primary  Anesthesia: General   Estimated Blood Loss: minimal  Specimens: * No specimens in log *   Findingssame  Complicationone  Implants: * No implants in log *

## 2012-10-17 NOTE — Other (Signed)
Patient to OR.

## 2012-10-17 NOTE — Progress Notes (Signed)
Patient resting. NPO for OR. No needs. Patient scheduled for 1100 hrs. Will monitor.

## 2012-10-17 NOTE — Other (Signed)
Patient found scratching scrotal area. Patient reports no itching. Will monitor NC at 2lpm. Will monitor.

## 2012-10-17 NOTE — Other (Signed)
Bedside and Verbal shift change report given to APRIL (oncoming nurse) by TODD (offgoing nurse). Report included the following information SBAR, Kardex, MAR and Recent Results.

## 2012-10-18 LAB — METABOLIC PANEL, COMPREHENSIVE
A-G Ratio: 0.7 — ABNORMAL LOW (ref 1.0–3.1)
ALT (SGPT): 133 U/L — ABNORMAL HIGH (ref 12.0–78.0)
AST (SGOT): 157 U/L — ABNORMAL HIGH (ref 15–37)
Albumin: 2.9 g/dL — ABNORMAL LOW (ref 3.40–5.00)
Alk. phosphatase: 152 U/L — ABNORMAL HIGH (ref 46–116)
Anion gap: 14 mmol/L (ref 10–17)
BUN/Creatinine ratio: 12 (ref 6.0–20.0)
BUN: 12 MG/DL (ref 7–18)
Bilirubin, total: 1.4 MG/DL — ABNORMAL HIGH (ref 0.20–1.00)
CO2: 26 mmol/L (ref 21–32)
Calcium: 8.7 MG/DL (ref 8.5–10.1)
Chloride: 102 mmol/L (ref 98–107)
Creatinine: 1 MG/DL (ref 0.6–1.3)
GFR est AA: >60 mL/min/{1.73_m2} (ref >60)
GFR est non-AA: >60 mL/min/{1.73_m2} (ref >60)
Globulin: 4.3 g/dL
Glucose: 247 mg/dL — ABNORMAL HIGH (ref 74–106)
Potassium: 4.2 mmol/L (ref 3.50–5.10)
Protein, total: 7.2 g/dL (ref 6.40–8.20)
Sodium: 138 mmol/L (ref 136–145)

## 2012-10-18 LAB — GLUCOSE, POC
Glucose (POC): 314 mg/dL — ABNORMAL HIGH (ref 74–106)
Glucose (POC): 179 mg/dL — ABNORMAL HIGH (ref 74–106)
Glucose (POC): 264 mg/dL — ABNORMAL HIGH (ref 74–106)

## 2012-10-18 MED ADMIN — lisinopril (PRINIVIL, ZESTRIL) tablet 20 mg: ORAL | @ 13:00:00 | NDC 68084019711

## 2012-10-18 MED ADMIN — oxycodone-acetaminophen (PERCOCET) 5-325 mg per tablet 1 tablet: ORAL | @ 17:00:00 | NDC 68084035511

## 2012-10-18 MED ADMIN — insulin aspart (NOVOLOG) injection: SUBCUTANEOUS | @ 16:00:00 | NDC 09999750101

## 2012-10-18 MED ADMIN — insulin aspart (NOVOLOG) injection: SUBCUTANEOUS | @ 04:00:00 | NDC 09999750101

## 2012-10-18 MED ADMIN — lansoprazole (PREVACID) capsule 30 mg: ORAL | @ 13:00:00 | NDC 68084046811

## 2012-10-18 MED ADMIN — vancomycin (VANCOCIN) 1,250 mg in dextrose 5% 250 mL IVPB: INTRAVENOUS | @ 04:00:00 | NDC 00409792202

## 2012-10-18 MED ADMIN — diphenhydrAMINE (BENADRYL) capsule 25 mg: ORAL | @ 04:00:00 | NDC 00904530661

## 2012-10-18 MED ADMIN — vancomycin (VANCOCIN) 1,250 mg in dextrose 5% 250 mL IVPB: INTRAVENOUS | @ 13:00:00 | NDC 00409792202

## 2012-10-18 MED ADMIN — diphenhydrAMINE (BENADRYL) injection 12.5 mg: INTRAVENOUS | @ 04:00:00 | NDC 63323066401

## 2012-10-18 NOTE — Op Note (Signed)
.  dictated

## 2012-10-18 NOTE — Progress Notes (Signed)
Consult for Vancomycin Dosing by Pharmacy by Dr. Alena Bills   Indication: Abscess   Target Level: 10-15    Day of Therapy: 2  Previous Regimen  1250 mg IV Q12Hr   Last Level  None   Other Current Antibiotics  None   Significant Cultures  In progress   Serum Creatinine Lab Results   Component Value Date/Time    Creatinine 1.00 10/18/2012  4:00 AM       Creatinine Clearance Estimated Creatinine Clearance: 92.9 ml/min (based on Cr of 1).   BUN Lab Results   Component Value Date/Time    BUN 12 10/18/2012  4:00 AM       WBC Lab Results   Component Value Date/Time    WBC 8.2 10/17/2012  5:15 AM       H/H Lab Results   Component Value Date/Time    HGB 15.8 10/17/2012  5:15 AM      Platelets Lab Results   Component Value Date/Time    PLATELET 81 10/17/2012  5:15 AM      Temp Temp: 98.2 ??F (36.8 ??C) ()     Dose administration notes:   Doses given appropriately as scheduled  Plan for level: Draw Trough Level 30 minutes before 2100 dose tonight on 10/17/12 at 2030  Adjustments: None  Plan:  Continue to monitor

## 2012-10-18 NOTE — Progress Notes (Signed)
Spiritual Care Assessment/Progress Notes    Yaxiel Minnie [id number]  [id number]    04-14-1960  52 y.o.  male    Patient Telephone Number: [phone] (home)   Religious Affiliation: No religion   Language: English   No emergency contact information on file.   There are no active problems to display for this patient.       Date: 10/18/2012       Level of Religious/Spiritual Activity:  []          Involved in faith tradition/spiritual practice    []          Not involved in faith tradition/spiritual practice  [x]          Spiritually oriented    []          Claims no spiritual orientation    []          seeking spiritual identity  []          Feels alienated from religious practice/tradition  []          Feels angry about religious practice/tradition  [x]          Spirituality/religious tradition IS a resource for coping at this time.  []          Not able to assess due to medical condition    Services Provided Today:  []          crisis intervention    []          reading Scriptures  []          spiritual assessment    []          prayer  [x]          empathic listening/emotional support  []          rites and rituals (cite in comments)  []          life review     []          religious support  []          theological development   []          advocacy  []          ethical dialog     [x]          blessing  []          bereavement support    []          support to family  []          anticipatory grief support   []          help with AMD  []          spiritual guidance    []          meditation      Spiritual Care Needs  [x]          Emotional Support  [x]          Spiritual/Religious Care  []          Loss/Adjustment  []          Advocacy/Referral /Ethics  []          No needs expressed at this time  []          Other: (note in comments)  Spiritual Care Plan  []          Follow up visits with pt/family  []          Provide materials  []          Schedule sacraments  []   Contact [facility]  [x]          Follow up as needed  []          Other:  (note in comments)     Comments:  Initial visit to Mr. Dieudonne who had surgery yesterday. He was sitting up in chair with Bible open on table in front of him. He was responsive to conversation and blessing was offered. He was smiling and grateful as I left rhe room.       Visited by: Lia Hopping. Sheryn Bison., [facility]  Pager # [phone]    Office # [phone]

## 2012-10-18 NOTE — Progress Notes (Deleted)
Patient observed  on rounds prior to obtaining bedside report from offgoing nurse.  Found patient unresponsive-questioned nurse as to if this is the patient's baseline and was told the patient was "refusing everything" and "like that all night".  Charge nurse assessed patient and stated the patient was alert during the shift.  A rapid response was called and the patient found to have a sbp in the 70's.  Medical interventions were ordered and carrried out as per rapid response team (see rapid response documentation).

## 2012-10-18 NOTE — Progress Notes (Signed)
Assumed patient care at 1930 hrs. Patient alert & oriented x3; mae. Beginning of shift assessment showed patient with surgical panty and gauze intact to scrotum. Patient observed later in shift scratching arms, legs, and surgical site. Benadryl given, and patient observed sleeping. Upon performing end of shift assessment, surgical underwear and dressing had been removed, along with bleeding on the sheets. It was determined that the patient had been scratching his genitals causing bleeding to that area. Vancomycin given as ordered. Vss; afebrile.

## 2012-10-18 NOTE — Progress Notes (Signed)
Assumed care for this patient admitted with c/o testicular pain and underwent a drainage of abscess. Receiving IV antibiotics.

## 2012-10-18 NOTE — Progress Notes (Signed)
Pt for discharge to [facility].  Discharge report called to Nurse Vicente Serene RN at [facility].  OIC, Sgt Anderson made aware of the discharge and the need for discharge officers and state van.

## 2012-10-18 NOTE — Discharge Summary (Signed)
.  dictated

## 2012-10-19 LAB — CULTURE, WOUND W GRAM STAIN

## 2012-10-19 NOTE — Op Note (Signed)
DATE OF PROCEDURE:    PREOPERATIVE DIAGNOSIS: Left side scrotal abscess.    POSTOPERATIVE DIAGNOSIS: Large scrotal abscess.    OPERATIVE PROCEDURE: Incision and drainage of the abscess and debridement.    SURGEON: Gilma Bessette K. Alena Bills, MD    ASSISTANT: Medical student and surgical tech.    ANESTHESIA: General.    INDICATIONS: He was admitted through the emergency room in the [facility] unit at  [facility] with large scrotal abscess and leukocytosis. He was  also dehydrated. He was on a number of medications and vancomycin was  started, he was hydrated. The next morning he was drained of the abscess.    DESCRIPTION OF PROCEDURE: After the patient was laid in dorsal recumbent  position, general anesthesia was then administered. The scrotum was prepped  with Betadine soap and solution and was properly draped. At the site of the  abscess, an incision was made longitudinally and was converted to a  cruciate incision and a moderate amount of pus was drained. It was cultured  and it was then irrigated and debrided of all the necrotic tissue in the  cavity, and after proper hemostasis was ascertained, the wound was packed  with iodoform gauze and dressings were applied.        Ulyess Blossom MD      Dictated by: Tiana Loft, M.D.]  Job#: [[id number]]  DD: [10/18/2012 01:59 P]  DT: [10/19/2012 07:53 A]  Transcribed by: [[email or web address]]    cc:            Tiana Loft, M.D.

## 2012-10-19 NOTE — Op Note (Signed)
DATE OF PROCEDURE:    PREOPERATIVE DIAGNOSIS: Left side scrotal abscess.    POSTOPERATIVE DIAGNOSIS: Large scrotal abscess.    OPERATIVE PROCEDURE: Incision and drainage of the abscess and debridement.    SURGEON: Shandee Jergens K. Kealan Buchan, MD    ASSISTANT: Medical student and surgical tech.    ANESTHESIA: General.    INDICATIONS: He was admitted through the emergency room in the DOC unit at  [facility] with large scrotal abscess and leukocytosis. He was  also dehydrated. He was on a number of medications and vancomycin was  started, he was hydrated. The next morning he was drained of the abscess.    DESCRIPTION OF PROCEDURE: After the patient was laid in dorsal recumbent  position, general anesthesia was then administered. The scrotum was prepped  with Betadine soap and solution and was properly draped. At the site of the  abscess, an incision was made longitudinally and was converted to a  cruciate incision and a moderate amount of pus was drained. It was cultured  and it was then irrigated and debrided of all the necrotic tissue in the  cavity, and after proper hemostasis was ascertained, the wound was packed  with iodoform gauze and dressings were applied.        Ladarius Seubert K Daesean Lazarz MD      Dictated by: [Elexius Minar, M.D.]  Job#: [[id number]]  DD: [10/18/2012 01:59 P]  DT: [10/19/2012 07:53 A]  Transcribed by: [[email or web address]]    cc:            Shlomo Seres, M.D.

## 2012-10-19 NOTE — Discharge Summary (Signed)
HISTORY OF PRESENT ILLNESS: This gentleman was admitted with a large  scrotal abscess on the left side and he was started on vancomycin, was  hydrated, and I and D of the abscess was done, and then it was packed with  iodoform gauze and today the dressing has been changed and it looks good.  It is cleaned and it is repacked with iodoform gauze and the patient is  going to be discharged back to where he came from and he should be on all  the medications he was on from before and the dressing has to be changed  every day as instructed, and I will see him at [facility] for followup in a  week or 10 days.        Ulyess Blossom MD        Dictated by: Tiana Loft, M.D.]  Job#: [[id number]]  DD: [10/18/2012 01:56 P]  DT: [10/19/2012 05:39 A]  Transcribed by: [[email or web address]]    cc:            Tiana Loft, M.D.

## 2012-10-20 LAB — CULTURE, ANAEROBIC: Culture result:: NO GROWTH
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
## 2013-03-11 ENCOUNTER — Encounter

## 2013-03-24 ENCOUNTER — Encounter

## 2013-03-24 MED ORDER — SODIUM CHLORIDE 0.9 % IJ SYRG
Freq: Once | INTRAMUSCULAR | Status: AC
Start: 2013-03-24 — End: 2013-03-24

## 2013-03-24 MED ORDER — IOVERSOL 320 MG/ML IV SOLN
320 mg iodine/mL | Freq: Once | INTRAVENOUS | Status: AC
Start: 2013-03-24 — End: 2013-03-24
  Administered 2013-03-24: 17:00:00 via INTRAVENOUS

## 2013-03-24 MED ORDER — DIATRIZOATE MEGLUMINE & SODIUM 66 %-10 % ORAL SOLN
66-10 % | Freq: Once | ORAL | Status: AC
Start: 2013-03-24 — End: 2013-03-24
  Administered 2013-03-24: 17:00:00 via ORAL

## 2013-03-24 MED FILL — BD POSIFLUSH NORMAL SALINE 0.9 % INJECTION SYRINGE: INTRAMUSCULAR | Qty: 10

## 2013-03-24 MED FILL — OPTIRAY 320 MG IODINE/ML INTRAVENOUS SOLUTION: 320 mg iodine/mL | INTRAVENOUS | Qty: 100

## 2013-03-24 MED FILL — GASTROGRAFIN 66 %-10 % ORAL SOLUTION: 66-10 % | ORAL | Qty: 30

## 2013-11-30 ENCOUNTER — Ambulatory Visit: Payer: Self-pay | Admitting: Physician Assistant

## 2013-11-30 LAB — RAPID STREP-A WITH REFLX: Micro Text Report: NEGATIVE

## 2013-12-03 LAB — BETA STREP CULTURE(ARMC)

## 2014-04-25 ENCOUNTER — Ambulatory Visit: Payer: Self-pay | Admitting: Physician Assistant

## 2014-05-25 ENCOUNTER — Ambulatory Visit
Admit: 2014-05-25 | Disposition: A | Discharge status: Home / Self Care | Payer: Self-pay | Attending: Family Medicine | Admitting: Family Medicine

## 2014-05-25 LAB — RAPID INFLUENZA A&B ANTIGENS

## 2014-06-03 ENCOUNTER — Encounter: Payer: Self-pay | Admitting: Family Medicine

## 2014-06-03 DIAGNOSIS — I10 Essential (primary) hypertension: Secondary | ICD-10-CM | POA: Insufficient documentation

## 2014-06-03 DIAGNOSIS — Z833 Family history of diabetes mellitus: Secondary | ICD-10-CM | POA: Insufficient documentation

## 2014-06-03 DIAGNOSIS — E66811 Obesity, class 1: Secondary | ICD-10-CM | POA: Insufficient documentation

## 2014-06-03 DIAGNOSIS — Z8249 Family history of ischemic heart disease and other diseases of the circulatory system: Secondary | ICD-10-CM | POA: Insufficient documentation

## 2014-06-03 DIAGNOSIS — K219 Gastro-esophageal reflux disease without esophagitis: Secondary | ICD-10-CM | POA: Insufficient documentation

## 2014-06-03 DIAGNOSIS — J302 Other seasonal allergic rhinitis: Secondary | ICD-10-CM | POA: Insufficient documentation

## 2014-06-03 DIAGNOSIS — E669 Obesity, unspecified: Secondary | ICD-10-CM | POA: Insufficient documentation

## 2014-06-11 ENCOUNTER — Inpatient Hospital Stay: Admit: 2014-06-11 | Primary: Family Medicine

## 2014-06-11 LAB — CBC WITH AUTOMATED DIFF
ABS. BASOPHILS: 0 10*3/uL (ref 0.0–0.2)
ABS. EOSINOPHILS: 0.5 10*3/uL (ref 0.0–0.7)
ABS. LYMPHOCYTES: 2.1 10*3/uL (ref 1.2–3.4)
ABS. MONOCYTES: 0.6 10*3/uL — ABNORMAL LOW (ref 1.1–3.2)
ABS. NEUTROPHILS: 3.1 10*3/uL (ref 1.4–6.5)
BASOPHILS: 1 % (ref 0–2)
EOSINOPHILS: 8 % — ABNORMAL HIGH (ref 0–5)
HCT: 34.7 % — ABNORMAL LOW (ref 36.8–45.2)
HGB: 12.3 g/dL — ABNORMAL LOW (ref 12.8–15.0)
IMMATURE GRANULOCYTES: 0.3 % (ref 0.0–5.0)
LYMPHOCYTES: 33 % (ref 16–40)
MCH: 33.4 PG — ABNORMAL HIGH (ref 27–31)
MCHC: 35.4 g/dL (ref 32–36)
MCV: 94.3 FL (ref 81–99)
MONOCYTES: 10 % (ref 0–12)
NEUTROPHILS: 48 % (ref 40–70)
NRBC: 0 PER 100 WBC
PLATELET: 47 10*3/uL — ABNORMAL LOW (ref 140–450)
RBC: 3.68 M/uL — ABNORMAL LOW (ref 4.0–5.2)
RDW: 15.6 % — ABNORMAL HIGH (ref 11.5–14.5)
WBC: 6.4 10*3/uL (ref 4.8–10.8)

## 2014-06-11 LAB — PTT: aPTT: 37.9 s — ABNORMAL HIGH (ref 24.5–31.6)

## 2014-06-11 LAB — PROTHROMBIN TIME + INR
INR: 1.6
Prothrombin time: 17.1 s — ABNORMAL HIGH (ref 9.8–12.0)

## 2014-06-12 LAB — METABOLIC PANEL, COMPREHENSIVE
A-G Ratio: 0.3 — ABNORMAL LOW (ref 1.0–3.1)
ALT (SGPT): 81 U/L — ABNORMAL HIGH (ref 12.0–78.0)
AST (SGOT): 102 U/L — ABNORMAL HIGH (ref 15–37)
Albumin: 1.8 g/dL — ABNORMAL LOW (ref 3.40–5.00)
Alk. phosphatase: 460 U/L — ABNORMAL HIGH (ref 46–116)
Anion gap: 8 mmol/L — ABNORMAL LOW (ref 10–17)
BUN/Creatinine ratio: 10 (ref 6.0–20.0)
BUN: 10 MG/DL (ref 7–18)
Bilirubin, total: 1.7 MG/DL — ABNORMAL HIGH (ref 0.20–1.00)
CO2: 28 mmol/L (ref 21–32)
Calcium: 7.6 MG/DL — ABNORMAL LOW (ref 8.5–10.1)
Chloride: 106 mmol/L (ref 98–107)
Creatinine: 0.96 MG/DL (ref 0.6–1.3)
GFR est AA: >60 mL/min/{1.73_m2} (ref >60)
GFR est non-AA: >60 mL/min/{1.73_m2} (ref >60)
Globulin: 5.9 g/dL
Glucose: 127 mg/dL — ABNORMAL HIGH (ref 74–106)
Potassium: 4.4 mmol/L (ref 3.50–5.10)
Protein, total: 7.7 g/dL (ref 6.40–8.20)
Sodium: 138 mmol/L (ref 136–145)

## 2014-06-15 ENCOUNTER — Encounter

## 2014-06-15 ENCOUNTER — Inpatient Hospital Stay: Admit: 2014-06-15 | Payer: PRIVATE HEALTH INSURANCE | Attending: Family Medicine | Primary: Family Medicine

## 2014-06-15 DIAGNOSIS — R609 Edema, unspecified: Secondary | ICD-10-CM

## 2014-06-15 MED ORDER — SODIUM CHLORIDE 0.9 % IJ SYRG
Freq: Once | INTRAMUSCULAR | Status: AC
Start: 2014-06-15 — End: 2014-06-15
  Administered 2014-06-15: 18:00:00 via INTRAVENOUS

## 2014-06-15 MED ORDER — DIATRIZOATE MEGLUMINE & SODIUM 66 %-10 % ORAL SOLN
66-10 % | Freq: Once | ORAL | Status: AC
Start: 2014-06-15 — End: 2014-06-15
  Administered 2014-06-15: 18:00:00 via ORAL

## 2014-06-15 MED ORDER — IODIXANOL 320 MG/ML IV SOLN
320 mg iodine/mL | Freq: Once | INTRAVENOUS | Status: AC
Start: 2014-06-15 — End: 2014-06-15
  Administered 2014-06-15: 18:00:00 via INTRAVENOUS

## 2014-06-15 MED ORDER — IODIXANOL 320 MG/ML IV SOLN
320 mg iodine/mL | Freq: Once | INTRAVENOUS | Status: AC
Start: 2014-06-15 — End: 2014-06-16

## 2014-06-15 MED ORDER — IOPAMIDOL 61 % IV SOLN
300 mg iodine /mL (61 %) | Freq: Once | INTRAVENOUS | Status: DC
Start: 2014-06-15 — End: 2014-06-15

## 2014-06-15 MED FILL — BD POSIFLUSH NORMAL SALINE 0.9 % INJECTION SYRINGE: INTRAMUSCULAR | Qty: 10

## 2014-06-15 MED FILL — ISOVUE-300  61 % INTRAVENOUS SOLUTION: 300 mg iodine /mL (61 %) | INTRAVENOUS | Qty: 100

## 2014-06-15 MED FILL — VISIPAQUE 320 MG IODINE/ML INTRAVENOUS SOLUTION: 320 mg iodine/mL | INTRAVENOUS | Qty: 100

## 2014-06-15 MED FILL — GASTROGRAFIN 66 %-10 % ORAL SOLUTION: 66-10 % | ORAL | Qty: 30

## 2014-06-17 ENCOUNTER — Inpatient Hospital Stay: Admit: 2014-06-17 | Primary: Family Medicine

## 2014-06-17 ENCOUNTER — Encounter

## 2014-06-17 LAB — METABOLIC PANEL, COMPREHENSIVE
A-G Ratio: 0.3 — ABNORMAL LOW (ref 1.0–3.1)
ALT (SGPT): 88 U/L — ABNORMAL HIGH (ref 12.0–78.0)
AST (SGOT): 119 U/L — ABNORMAL HIGH (ref 15–37)
Albumin: 1.7 g/dL — ABNORMAL LOW (ref 3.40–5.00)
Alk. phosphatase: 538 U/L — ABNORMAL HIGH (ref 46–116)
Anion gap: 11 mmol/L (ref 10–17)
BUN/Creatinine ratio: 8 (ref 6.0–20.0)
BUN: 8 MG/DL (ref 7–18)
Bilirubin, total: 1.2 MG/DL — ABNORMAL HIGH (ref 0.20–1.00)
CO2: 24 mmol/L (ref 21–32)
Calcium: 7.3 MG/DL — ABNORMAL LOW (ref 8.5–10.1)
Chloride: 105 mmol/L (ref 98–107)
Creatinine: 0.99 MG/DL (ref 0.6–1.3)
GFR est AA: >60 mL/min/{1.73_m2} (ref >60)
GFR est non-AA: >60 mL/min/{1.73_m2} (ref >60)
Globulin: 6.3 g/dL
Glucose: 276 mg/dL — ABNORMAL HIGH (ref 74–106)
Potassium: 4.9 mmol/L (ref 3.50–5.10)
Protein, total: 8 g/dL (ref 6.40–8.20)
Sodium: 135 mmol/L — ABNORMAL LOW (ref 136–145)

## 2014-06-17 LAB — CBC WITH AUTOMATED DIFF
ABS. BASOPHILS: 0 10*3/uL (ref 0.0–0.2)
ABS. EOSINOPHILS: 0.4 10*3/uL (ref 0.0–0.7)
ABS. LYMPHOCYTES: 1.8 10*3/uL (ref 1.2–3.4)
ABS. MONOCYTES: 0.7 10*3/uL — ABNORMAL LOW (ref 1.1–3.2)
ABS. NEUTROPHILS: 2.7 10*3/uL (ref 1.4–6.5)
BASOPHILS: 1 % (ref 0–2)
EOSINOPHILS: 7 % — ABNORMAL HIGH (ref 0–5)
HCT: 37 % (ref 36.8–45.2)
HGB: 12.9 g/dL (ref 12.8–15.0)
IMMATURE GRANULOCYTES: 0.2 % (ref 0.0–5.0)
LYMPHOCYTES: 33 % (ref 16–40)
MCH: 33.3 PG — ABNORMAL HIGH (ref 27–31)
MCHC: 34.9 g/dL (ref 32–36)
MCV: 95.6 FL (ref 81–99)
MONOCYTES: 12 % (ref 0–12)
NEUTROPHILS: 47 % (ref 40–70)
NRBC: 0 PER 100 WBC
PLATELET: 46 10*3/uL — ABNORMAL LOW (ref 140–450)
RBC: 3.87 M/uL — ABNORMAL LOW (ref 4.0–5.2)
RDW: 15.6 % — ABNORMAL HIGH (ref 11.5–14.5)
WBC: 5.6 10*3/uL (ref 4.8–10.8)

## 2014-06-17 LAB — AMYLASE: Amylase: 94 U/L (ref 25–115)

## 2014-06-17 LAB — LIPASE: Lipase: 290 U/L — ABNORMAL HIGH (ref 65–230)

## 2014-06-18 ENCOUNTER — Inpatient Hospital Stay: Payer: PRIVATE HEALTH INSURANCE | Attending: Family Medicine | Primary: Family Medicine

## 2014-06-18 DIAGNOSIS — G8929 Other chronic pain: Secondary | ICD-10-CM

## 2014-06-18 NOTE — Progress Notes (Signed)
06/18/14    1100              [name] was for a MRI   Abdomen with and without contrast and MRCP.  Mr Walter Cox said he is claustrophobic and can't tolerate this exam without sedation.   Amario  Cephus Slatere Luca MR

## 2014-07-01 ENCOUNTER — Inpatient Hospital Stay: Admit: 2014-07-01 | Primary: Family Medicine

## 2014-07-01 LAB — METABOLIC PANEL, BASIC
Anion gap: 15 mmol/L (ref 10–17)
BUN/Creatinine ratio: 10 (ref 6.0–20.0)
BUN: 10 MG/DL (ref 7–18)
CO2: 24 mmol/L (ref 21–32)
Calcium: 7.5 MG/DL — ABNORMAL LOW (ref 8.5–10.1)
Chloride: 104 mmol/L (ref 98–107)
Creatinine: 1 MG/DL (ref 0.6–1.3)
GFR est AA: >60 mL/min/{1.73_m2} (ref >60)
GFR est non-AA: >60 mL/min/{1.73_m2} (ref >60)
Glucose: 288 mg/dL — ABNORMAL HIGH (ref 74–106)
Potassium: 4.7 mmol/L (ref 3.50–5.10)
Sodium: 138 mmol/L (ref 136–145)

## 2014-07-02 ENCOUNTER — Inpatient Hospital Stay: Payer: PRIVATE HEALTH INSURANCE

## 2014-07-02 ENCOUNTER — Observation Stay: Admit: 2014-07-02 | Payer: PRIVATE HEALTH INSURANCE | Primary: Family Medicine

## 2014-07-02 ENCOUNTER — Inpatient Hospital Stay: Payer: PRIVATE HEALTH INSURANCE | Attending: Family Medicine | Primary: Family Medicine

## 2014-07-02 MED ORDER — SODIUM CHLORIDE 0.9 % IJ SYRG
Freq: Once | INTRAMUSCULAR | Status: AC
Start: 2014-07-02 — End: 2014-07-02
  Administered 2014-07-02: 18:00:00 via INTRAVENOUS

## 2014-07-02 MED ORDER — LORAZEPAM 2 MG/ML IJ SOLN
2 mg/mL | Freq: Once | INTRAMUSCULAR | Status: AC
Start: 2014-07-02 — End: 2014-07-02
  Administered 2014-07-02: 16:00:00 via INTRAVENOUS

## 2014-07-02 MED ORDER — SENNOSIDES 8.6 MG TAB
8.6 mg | Freq: Every day | ORAL | Status: DC
Start: 2014-07-02 — End: 2014-07-02

## 2014-07-02 MED ORDER — GADOPENTETATE DIMEGLUMINE 10 MMOL/20 ML (469.01 MG/ML) IV
10 mmol/20 mL (469.01 mg/mL) | Freq: Once | INTRAVENOUS | Status: AC
Start: 2014-07-02 — End: 2014-07-02
  Administered 2014-07-02: 18:00:00 via INTRAVENOUS

## 2014-07-02 MED ORDER — PNEUMOCOCCAL 23-VALPS VACCINE 25 MCG/0.5 ML INJECTION
25 mcg/0.5 mL | INTRAMUSCULAR | Status: DC
Start: 2014-07-02 — End: 2014-07-02

## 2014-07-02 MED FILL — MAGNEVIST 10 MMOL/20 ML (469.01 MG/ML) INTRAVENOUS SOLUTION: 10 mmol/20 mL (469.01 mg/mL) | INTRAVENOUS | Qty: 20

## 2014-07-02 MED FILL — BD POSIFLUSH NORMAL SALINE 0.9 % INJECTION SYRINGE: INTRAMUSCULAR | Qty: 10

## 2014-07-02 MED FILL — LORAZEPAM 2 MG/ML IJ SOLN: 2 mg/mL | INTRAMUSCULAR | Qty: 1

## 2014-07-02 NOTE — Other (Signed)
Alert pending mri . Given prn ativan as ordered.

## 2014-07-02 NOTE — Progress Notes (Signed)
Admitting notified of patient arrival. Patient silent, but appears to have no respiratory distress. Ambulatory with a steady gate. Will monitor.
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
## 2014-07-02 NOTE — Progress Notes (Signed)
Problem: Pain - Acute  Goal: *Control of acute pain  Outcome: Progressing Towards Goal  No report of pain

## 2014-07-02 NOTE — Other (Signed)
Pt discharged back to [facility] via state van in no acute distress.

## 2014-10-31 ENCOUNTER — Inpatient Hospital Stay
Admit: 2014-10-31 | Discharge: 2014-11-02 | Payer: PRIVATE HEALTH INSURANCE | Source: Other Acute Inpatient Hospital | Attending: Nephrology | Admitting: Nephrology

## 2014-10-31 DIAGNOSIS — K861 Other chronic pancreatitis: Secondary | ICD-10-CM

## 2014-10-31 LAB — GLUCOSE, POC: Glucose (POC): 121 mg/dL — ABNORMAL HIGH (ref 74–106)

## 2014-10-31 MED ORDER — HYDROCODONE-ACETAMINOPHEN 5 MG-325 MG TAB
5-325 mg | ORAL | Status: DC | PRN
Start: 2014-10-31 — End: 2014-11-02
  Administered 2014-11-01 – 2014-11-02 (×3): via ORAL

## 2014-10-31 MED ORDER — LISINOPRIL 10 MG TAB
10 mg | Freq: Every day | ORAL | Status: DC
Start: 2014-10-31 — End: 2014-11-02
  Administered 2014-11-01 – 2014-11-02 (×2): via ORAL

## 2014-10-31 MED ORDER — HYDROMORPHONE (PF) 2 MG/ML IJ SOLN
2 mg/mL | Freq: Four times a day (QID) | INTRAMUSCULAR | Status: DC | PRN
Start: 2014-10-31 — End: 2014-10-31
  Administered 2014-10-31 (×2): via INTRAVENOUS

## 2014-10-31 MED ORDER — ENOXAPARIN 40 MG/0.4 ML SUB-Q SYRINGE
40 mg/0.4 mL | SUBCUTANEOUS | Status: DC
Start: 2014-10-31 — End: 2014-11-02
  Administered 2014-11-01 – 2014-11-02 (×2): via SUBCUTANEOUS

## 2014-10-31 MED ORDER — HYDROCHLOROTHIAZIDE 25 MG TAB
25 mg | Freq: Every day | ORAL | Status: DC
Start: 2014-10-31 — End: 2014-11-01

## 2014-10-31 MED ORDER — INSULIN DETEMIR 100 UNIT/ML INJECTION
100 unit/mL | Freq: Every evening | SUBCUTANEOUS | Status: DC
Start: 2014-10-31 — End: 2014-10-31

## 2014-10-31 MED ORDER — DIPHENHYDRAMINE 25 MG CAP
25 mg | ORAL | Status: DC | PRN
Start: 2014-10-31 — End: 2014-11-02

## 2014-10-31 MED ORDER — ACETAMINOPHEN 325 MG TABLET
325 mg | ORAL | Status: DC | PRN
Start: 2014-10-31 — End: 2014-11-02

## 2014-10-31 MED ORDER — HYDROMORPHONE (PF) 1 MG/ML IJ SOLN
1 mg/mL | INTRAMUSCULAR | Status: DC | PRN
Start: 2014-10-31 — End: 2014-11-02
  Administered 2014-11-01 – 2014-11-02 (×3): via INTRAVENOUS

## 2014-10-31 MED ORDER — DOCUSATE SODIUM 100 MG CAP
100 mg | Freq: Two times a day (BID) | ORAL | Status: DC
Start: 2014-10-31 — End: 2014-11-02
  Administered 2014-11-01 – 2014-11-02 (×4): via ORAL

## 2014-10-31 MED ORDER — SODIUM CHLORIDE 0.45 % IV
0.45 % | INTRAVENOUS | Status: DC
Start: 2014-10-31 — End: 2014-11-02
  Administered 2014-11-01 – 2014-11-02 (×4): via INTRAVENOUS

## 2014-10-31 MED ORDER — ONDANSETRON 4 MG TAB, RAPID DISSOLVE
4 mg | ORAL | Status: DC | PRN
Start: 2014-10-31 — End: 2014-11-02

## 2014-10-31 MED ORDER — ENOXAPARIN 40 MG/0.4 ML SUB-Q SYRINGE
40 mg/0.4 mL | SUBCUTANEOUS | Status: DC
Start: 2014-10-31 — End: 2014-10-31

## 2014-10-31 MED ORDER — INSULIN GLARGINE 100 UNIT/ML INJECTION
100 unit/mL | Freq: Every day | SUBCUTANEOUS | Status: DC
Start: 2014-10-31 — End: 2014-11-02
  Administered 2014-11-01 – 2014-11-02 (×2): via SUBCUTANEOUS

## 2014-10-31 MED FILL — HYDROMORPHONE (PF) 2 MG/ML IJ SOLN: 2 mg/mL | INTRAMUSCULAR | Qty: 1

## 2014-10-31 MED FILL — SODIUM CHLORIDE 0.45 % IV: 0.45 % | INTRAVENOUS | Qty: 1000

## 2014-10-31 NOTE — Other (Signed)
Pt was medicated for abd pain as per MAR.  He is tolerating his diet and continues to breath easily on RA.  I was called to the room when pt pointed ot his L foot at his toes which was bleeding. He states he does not know how that happened. The area was cleaned with NS between his toes and a gauze dsg applied.  He has orders for fluids an other meds.

## 2014-10-31 NOTE — Progress Notes (Signed)
Patient admitted to Room #286B at 0430 hrs. Alert & oriented X3; ambulatory without assistance. No c/o pain or discomfort upon admission. Vss; afebrile. No reports of n/v since arrival. Bedside report given to [place], [place].

## 2014-10-31 NOTE — H&P (Addendum)
[facility]  ADMISSION NOTE/H&P  Demetrius Charity      Name:  Walter Cox, Walter Cox  MR#:  [id number]  DOB:  1960/06/10  Account #:  [id number]  Date of Adm:  10/31/2014      DATE OF ADMISSION: 10/31/2014    REASON FOR  ADMISSION: Severe abdominal pain,  diarrhea.    HISTORY OF PRESENT ILLNESS: The patient is a 54-year-  old black male patient who has been known to have a history  of:  1. Liver cirrhosis.  2. Hepatitis C.  3. IV drug abuse.  4. Pancreatitis.  5. Insulin-dependent diabetes mellitus.    The patient has been complaining of abdominal pain on and  off for the last 4 or 5 weeks, associated with passing a large  amount of dark bloody stool. He was them taken to the  emergency room of [facility] where he was  evaluated and transferred to our hospital for further workup.  On specific questioning, he states that the pain has been  chronic, especially following food intake, he has bloating and  abdominal pain as well as diarrhea. He was noted to have a  history of chronic pancreatitis. He also had bloating and  distention of the abdomen. He was noted to have history of  liver cirrhosis for many years. The patient is an ex-drug  abuser at this time, has a history of hepatitis C that he knows  of. The patient also has a history of diabetes mellitus for  which he is on Levemir 58 units q. morning. The patient  denies any fever, sweating or chills. He denies any easy  bruising, bleeding or any hematemesis.    PAST MEDICAL HISTORY  1. History of recent cholecystectomy.  2. History of liver cirrhosis.  3. History of diabetes.  4. History of IV drug abuse.  5. History of hepatitis C with liver cirrhosis.    MEDICATIONS INCLUDE  1. Levemir 58 units q. a.m.  2. Lisinopril 20 mg daily.  3. Colace 100 mg b.i.d. daily.  4. Hydrochlorothiazide 25 mg daily.    ALLERGIES: NOT KNOWN.    SOCIAL HISTORY: Past history of intravenous drug abuse.  Past history of alcoholism.    FAMILY HISTORY: Unremarkable.     REVIEW OF SYSTEMS  HEENT: Unremarkable.  CARDIOVASCULAR: Unremarkable.  PULMONARY: Unremarkable.  GASTROINTESTINAL: See present illness.  GENITOURINARY: Unremarkable.    PHYSICAL EXAMINATION  GENERAL: Shows a middle-aged black male patient who  appears to be in moderate discomfort.  VITAL SIGNS: His blood pressure is 150/96, heart rate 82,  temperature 98.  HEENT: Shows no pallor of the conjunctivae.  NECK: Shows no JVD.  CHEST: Clear to auscultation and percussion.  HEART: Regular.  ABDOMEN: Full, bowel sounds are not increased. Abdomen  feels distended, tight, but there is no rebound tenderness.  EXTREMITIES: Shows 2+ edema.  NEUROLOGIC: Within normal limits.    LABORATORY DATA: So far includes, from [facility]: Urinalysis is completely within normal limits. Glucose  114, BUN 13, creatinine 0.9. Electrolytes were all within  normal limits. Lactic acid was 1.4. Bilirubin 2.7, direct  bilirubin 1.5, albumin is 1.9. Lipase is 60, amylase is 316,  ALT 84. AST is 143. Troponin less than 0.01.    CAT scan of the abdomen shows mild splenomegaly and  small liver consistent with cirrhosis. There is mild edema of  subcutaneous fat, as well as mesenteric fat. Minimal amount  of ascites in the pelvis, no localization, no  extra-  abdominal gas. Normal appendix. Small to moderate amount  of colonic stool and bowels are otherwise grossly  unremarkable.    IMPRESSION INCLUDES  1. Abdominal pain, probably from chronic pancreatitis.  2. No evidence of intestinal obstruction.  3. History of liver cirrhosis.  4. History of hepatitis C.  5. Mild hypertension.    PLAN OF TREATMENT: Medications will be resumed.  Amylase and lipase will be obtained daily. Pain management  be provided. Insulin will be resumed. Regular diet will be  given.        Georgette Shell, MD    MG / JB  D:  10/31/2014   18:33  T:  10/31/2014   20:01  Job #:  [id number]

## 2014-10-31 NOTE — Progress Notes (Signed)
Problem: Patient Education: Go to Patient Education Activity  Goal: Patient/Family Education  Outcome: Not Met  Pt will express knowledge if disease process and treatment plans.

## 2014-11-01 ENCOUNTER — Inpatient Hospital Stay: Admit: 2014-11-01 | Payer: PRIVATE HEALTH INSURANCE | Primary: Family Medicine

## 2014-11-01 LAB — CBC WITH AUTOMATED DIFF
ABS. BASOPHILS: 0 10*3/uL (ref 0.0–0.2)
ABS. EOSINOPHILS: 0.4 10*3/uL (ref 0.0–0.7)
ABS. LYMPHOCYTES: 2.6 10*3/uL (ref 1.2–3.4)
ABS. MONOCYTES: 0.9 10*3/uL — ABNORMAL LOW (ref 1.1–3.2)
ABS. NEUTROPHILS: 3.2 10*3/uL (ref 1.4–6.5)
BASOPHILS: 0 % (ref 0–2)
EOSINOPHILS: 5 % (ref 0–5)
HCT: 31.2 % — ABNORMAL LOW (ref 36.8–45.2)
HGB: 11 g/dL — ABNORMAL LOW (ref 12.8–15.0)
IMMATURE GRANULOCYTES: 0.3 % (ref 0.0–5.0)
LYMPHOCYTES: 36 % (ref 16–40)
MCH: 33.6 PG — ABNORMAL HIGH (ref 27–31)
MCHC: 35.3 g/dL (ref 32–36)
MCV: 95.4 FL (ref 81–99)
MONOCYTES: 13 % — ABNORMAL HIGH (ref 0–12)
NEUTROPHILS: 46 % (ref 40–70)
PLATELET: 57 10*3/uL — ABNORMAL LOW (ref 140–450)
RBC: 3.27 M/uL — ABNORMAL LOW (ref 4.0–5.2)
RDW: 15.7 % — ABNORMAL HIGH (ref 11.5–14.5)
WBC: 7.1 10*3/uL (ref 4.8–10.8)

## 2014-11-01 LAB — PROTHROMBIN TIME + INR
INR: 1.6
Prothrombin time: 17.6 s — ABNORMAL HIGH (ref 9.8–12.0)

## 2014-11-01 LAB — URINALYSIS W/ RFLX MICROSCOPIC
Bilirubin: NEGATIVE
Glucose: NEGATIVE mg/dL
Ketone: NEGATIVE mg/dL
Nitrites: NEGATIVE
Protein: NEGATIVE mg/dL
Specific gravity: 1.02 (ref 1.003–1.030)
Urobilinogen: 2 EU/dL — ABNORMAL HIGH (ref 0.2–1.0)
pH (UA): 5.5 (ref 5.0–7.0)

## 2014-11-01 LAB — METABOLIC PANEL, COMPREHENSIVE
A-G Ratio: 0.3 — ABNORMAL LOW (ref 1.0–3.1)
ALT (SGPT): 74 U/L (ref 12.0–78.0)
AST (SGOT): 95 U/L — ABNORMAL HIGH (ref 15–37)
Albumin: 1.5 g/dL — ABNORMAL LOW (ref 3.40–5.00)
Alk. phosphatase: 243 U/L — ABNORMAL HIGH (ref 46–116)
Anion gap: 9 mmol/L — ABNORMAL LOW (ref 10–17)
BUN/Creatinine ratio: 12 (ref 6.0–20.0)
BUN: 12 MG/DL (ref 7–18)
Bilirubin, total: 1.9 MG/DL — ABNORMAL HIGH (ref 0.20–1.00)
CO2: 28 mmol/L (ref 21–32)
Calcium: 7.4 MG/DL — ABNORMAL LOW (ref 8.5–10.1)
Chloride: 107 mmol/L (ref 98–107)
Creatinine: 1 MG/DL (ref 0.6–1.3)
GFR est AA: >60 mL/min/{1.73_m2} (ref >60)
GFR est non-AA: >60 mL/min/{1.73_m2} (ref >60)
Globulin: 4.6 g/dL
Glucose: 162 mg/dL — ABNORMAL HIGH (ref 74–106)
Potassium: 3.9 mmol/L (ref 3.50–5.10)
Protein, total: 6.1 g/dL — ABNORMAL LOW (ref 6.40–8.20)
Sodium: 140 mmol/L (ref 136–145)

## 2014-11-01 LAB — URINE MICROSCOPIC
Casts: NONE SEEN /lpf
Crystals, urine: NONE SEEN /LPF
Epithelial cells: NONE SEEN /hpf

## 2014-11-01 LAB — IRON PROFILE
Iron % saturation: 113 % — ABNORMAL HIGH (ref 20–50)
Iron: 129 ug/dL (ref 50–175)
TIBC: 114 ug/dL — ABNORMAL LOW (ref 250–450)

## 2014-11-01 LAB — GLUCOSE, POC
Glucose (POC): 159 mg/dL — ABNORMAL HIGH (ref 74–106)
Glucose (POC): 149 mg/dL — ABNORMAL HIGH (ref 74–106)

## 2014-11-01 LAB — PTT: aPTT: 42.6 s — ABNORMAL HIGH (ref 24.5–31.6)

## 2014-11-01 LAB — LIPASE: Lipase: 339 U/L — ABNORMAL HIGH (ref 65–230)

## 2014-11-01 LAB — AMYLASE: Amylase: 85 U/L (ref 25–115)

## 2014-11-01 MED ORDER — INSULIN LISPRO 100 UNIT/ML INJECTION
100 unit/mL | Freq: Three times a day (TID) | SUBCUTANEOUS | Status: DC
Start: 2014-11-01 — End: 2014-11-02
  Administered 2014-11-02 (×3): via SUBCUTANEOUS

## 2014-11-01 MED ORDER — HYDROCHLOROTHIAZIDE 12.5 MG CAP
12.5 mg | Freq: Every day | ORAL | Status: DC
Start: 2014-11-01 — End: 2014-11-02
  Administered 2014-11-01 – 2014-11-02 (×2): via ORAL

## 2014-11-01 MED FILL — INSULIN GLARGINE 100 UNIT/ML INJECTION: 100 unit/mL | SUBCUTANEOUS | Qty: 1

## 2014-11-01 MED FILL — DOCUSATE SODIUM 100 MG CAP: 100 mg | ORAL | Qty: 1

## 2014-11-01 MED FILL — LOVENOX 40 MG/0.4 ML SUBCUTANEOUS SYRINGE: 40 mg/0.4 mL | SUBCUTANEOUS | Qty: 0.4

## 2014-11-01 MED FILL — HYDROCODONE-ACETAMINOPHEN 5 MG-325 MG TAB: 5-325 mg | ORAL | Qty: 1

## 2014-11-01 MED FILL — LISINOPRIL 10 MG TAB: 10 mg | ORAL | Qty: 2

## 2014-11-01 MED FILL — HYDROMORPHONE (PF) 1 MG/ML IJ SOLN: 1 mg/mL | INTRAMUSCULAR | Qty: 2

## 2014-11-01 MED FILL — HYDROCHLOROTHIAZIDE 12.5 MG CAP: 12.5 mg | ORAL | Qty: 2

## 2014-11-01 MED FILL — SODIUM CHLORIDE 0.45 % IV: 0.45 % | INTRAVENOUS | Qty: 1000

## 2014-11-01 NOTE — Progress Notes (Signed)
Assumed patient care at 2100 hrs. Complained of abdominal pain X1 this shift. NORCO given as ordered was effective. No reports of N/V, or diarrhea. IVF's infusing. VSS; Afebrile. [facility] Officers ordered for 10:00am for XRAY.

## 2014-11-01 NOTE — Progress Notes (Signed)
Problem: Pain  Goal: *Control of Pain  Outcome: Progressing Towards Goal  Pain controlled with PRN pain medication.

## 2014-11-01 NOTE — Progress Notes (Signed)
Problem: Falls - Risk of  Goal: *Knowledge of fall prevention  Outcome: Progressing Towards Goal  Patient acknowledges and is able to participate in fall prevention plan.

## 2014-11-01 NOTE — Progress Notes (Signed)
Problem: Pancreatitis  Goal: *Absence of nausea/vomiting  Outcome: Progressing Towards Goal  Patient denies nausea and vomiting at this time.

## 2014-11-01 NOTE — Progress Notes (Signed)
Problem: Falls - Risk of  Goal: *Absence of falls  Outcome: Progressing Towards Goal  Patient will remain without falls during this admission.

## 2014-11-01 NOTE — Other (Signed)
Chart reviewed for medical necessity.  S.Joedy Eickhoff,RN,CRM

## 2014-11-02 LAB — GLUCOSE, POC
Glucose (POC): 168 mg/dL — ABNORMAL HIGH (ref 74–106)
Glucose (POC): 118 mg/dL — ABNORMAL HIGH (ref 74–106)
Glucose (POC): 104 mg/dL (ref 74–106)

## 2014-11-02 LAB — HEMOGLOBIN A1C WITH EAG: Hemoglobin A1c: 5.7 % (ref 4.4–6.4)

## 2014-11-02 LAB — AMYLASE: Amylase: 74 U/L (ref 25–115)

## 2014-11-02 LAB — LIPASE: Lipase: 183 U/L (ref 65–230)

## 2014-11-02 MED FILL — HYDROCHLOROTHIAZIDE 12.5 MG CAP: 12.5 mg | ORAL | Qty: 2

## 2014-11-02 MED FILL — INSULIN LISPRO 100 UNIT/ML INJECTION: 100 unit/mL | SUBCUTANEOUS | Qty: 5

## 2014-11-02 MED FILL — SODIUM CHLORIDE 0.45 % IV: 0.45 % | INTRAVENOUS | Qty: 1000

## 2014-11-02 MED FILL — INSULIN GLARGINE 100 UNIT/ML INJECTION: 100 unit/mL | SUBCUTANEOUS | Qty: 58

## 2014-11-02 MED FILL — DOCUSATE SODIUM 100 MG CAP: 100 mg | ORAL | Qty: 1

## 2014-11-02 MED FILL — HYDROMORPHONE (PF) 1 MG/ML IJ SOLN: 1 mg/mL | INTRAMUSCULAR | Qty: 2

## 2014-11-02 MED FILL — LISINOPRIL 10 MG TAB: 10 mg | ORAL | Qty: 2

## 2014-11-02 MED FILL — HYDROCODONE-ACETAMINOPHEN 5 MG-325 MG TAB: 5-325 mg | ORAL | Qty: 1

## 2014-11-02 MED FILL — LOVENOX 40 MG/0.4 ML SUBCUTANEOUS SYRINGE: 40 mg/0.4 mL | SUBCUTANEOUS | Qty: 0.4

## 2014-11-02 NOTE — Discharge Summary (Signed)
[facility]  TRANSFER SUMMARY    Name:  Walter, Cox  MR#:  [id number]  DOB:  1960/10/22  Account #:  [id number]  Date of Adm:  10/31/2014      DATE OF ADMISSION: 10/31/2014.    DATE OF DISCHARGE: 11/01/2014.    FINAL DIAGNOSES  1. Abdominal pain due to chronic pancreatitis.  2. History of hepatitis C.  3. History of insulin-dependent mellitus.  4. History of liver cirrhosis.  5. History of drug abuse.    CONDITION ON DISCHARGE: Good.    MEDICATIONS ON DISCHARGE INCLUDE  1. Resume Levemir 58 units q.a.m.  2. Lisinopril 20 mg daily.  3. Colace 100 mg daily.  4. HydroDIURIL 25 mg p.o. daily.  5. Regular insulin as per sliding scale.    REASON FOR ADMISSION: [name] is a 54-year-  old black male patient who is known to have the above  diagnoses. He began to complain of abdominal pain on and  off for the 4 or 5 weeks, associated with large amount of  dark stool. He was taken to the emergency room of [facility] for evaluation and then transferred to our  hospital. The patient's pain is nonspecific, it is chronic. It is  exacerbated by fluid intake. He has bloating, abdominal  pain, diarrhea. The patient has been known to have a history  of chronic pancreatitis, history of cholecystectomy in the  past, and history of liver cirrhosis as well as history of IV  drug abuse for many years. The patient is diabetic,  Levemir 58 units q.a.m. The patient denies any fever,  sweating, or chills.    PHYSICAL EXAMINATION  GENERAL: Shows a rather angry black male patient whose  complaint of pain is suspect to me.  VITAL SIGNS: Blood pressure 150/96, heart rate 82,  temperature 98, respirations 18.  HENT: Shows no pallor of the conjunctivae.  NECK: Shows no JVD.  CHEST: Clear to auscultation and percussion.  ABDOMEN: Full and kind of tense, but no direct or rebound  tenderness, and bowel sounds are heard.  EXTREMITIES: Show no edema.    LABORATORY DATA: Showed glucose 114, BUN 13,   creatinine 0.9, lactic acid is 1.4, direct bilirubin is 1.5, total  bilirubin 2.7, albumin 1.9, amylase was 85 on admission,  lipase 339.    Chest x-ray was unremarkable except for cardiomegaly and  left wall thickening. There was no congestion or infiltrate.  Urinalysis was unremarkable. INR 1.6, PTT was 42.    COURSE IN THE HOSPITAL: The patient continued to have  abdominal pain and requesting pain medications and the  pain usually manifests more whenever there is a healthcare  profession. I had a frank discussion with the patient and told  him that he may have genuine abdominal pain, but he  should not complicate it with drug addiction again. The  patient seemed to take this to heart, so I am discharging him  today to be followed on outpatient basis.    CONDITION ON DISCHARGE: Good.    PROGNOSIS: Good.    The patient will benefit from GI telemetry clinic in the future.        Georgette Shell, MD    MG / TB  D:  11/02/2014   11:04  T:  11/02/2014   11:33  Job #:  [id number]

## 2014-11-02 NOTE — Other (Signed)
REPORT GIVEN TO [facility] NURSE KIABEL TOOK REPORT PT. IS ALERT AND ORIENTED AND AMBULATORY X3 HE WILL GO BACK VIA VAN . PT IN STABLE CONDITION.

## 2014-11-02 NOTE — Other (Signed)
Verbal shift change report given to Joyce RN (oncoming nurse) by Anisse RN (offgoing nurse). Report included the following information SBAR and Kardex.

## 2014-11-02 NOTE — Progress Notes (Signed)
Patient slept through the night without incident. Medicated for complaint of pain. IVF infusing. Patient in bed resting quietly. Continue plan of care.

## 2014-11-02 NOTE — Progress Notes (Signed)
Visited the patient and offered spiritual support and assistance. Patient said he was doing OK. Patient expressed gratefulness for the visit.    Rev. Joanie Coddington [facility]  Page: [phone]. Call: [phone]

## 2014-11-02 NOTE — Progress Notes (Signed)
Assumed care from 0715,alert and oriented x 3,no complaint of pain @ this time,stable,showing tv,will monitor.

## 2014-11-02 NOTE — Progress Notes (Signed)
Pt discharged @ this time to [facility] with escorts in stable condition.IV access taken out and armband removed and shredded per protocolReport was called in to receiving facility.Pt left with all personal belongings.

## 2014-11-25 ENCOUNTER — Other Ambulatory Visit: Payer: Self-pay | Admitting: Family Medicine

## 2014-11-25 MED ORDER — OMEPRAZOLE 20 MG PO CPDR: 20.0000 mg | DELAYED_RELEASE_CAPSULE | Freq: Every day | ORAL | Status: DC

## 2014-12-24 ENCOUNTER — Ambulatory Visit (INDEPENDENT_AMBULATORY_CARE_PROVIDER_SITE_OTHER): Payer: 59 | Admitting: Family Medicine

## 2014-12-24 ENCOUNTER — Encounter: Payer: Self-pay | Admitting: Family Medicine

## 2014-12-24 VITALS — BP 130/92 | HR 72 | Ht 71.0 in | Wt 250.4 lb

## 2014-12-24 DIAGNOSIS — R7303 Prediabetes: Secondary | ICD-10-CM | POA: Diagnosis not present

## 2014-12-24 DIAGNOSIS — I10 Essential (primary) hypertension: Secondary | ICD-10-CM | POA: Diagnosis not present

## 2014-12-24 DIAGNOSIS — K219 Gastro-esophageal reflux disease without esophagitis: Secondary | ICD-10-CM | POA: Diagnosis not present

## 2014-12-24 DIAGNOSIS — M7711 Lateral epicondylitis, right elbow: Secondary | ICD-10-CM

## 2014-12-24 DIAGNOSIS — E559 Vitamin D deficiency, unspecified: Secondary | ICD-10-CM | POA: Diagnosis not present

## 2014-12-24 DIAGNOSIS — J302 Other seasonal allergic rhinitis: Secondary | ICD-10-CM

## 2014-12-24 DIAGNOSIS — Z23 Encounter for immunization: Secondary | ICD-10-CM | POA: Diagnosis not present

## 2014-12-24 DIAGNOSIS — E669 Obesity, unspecified: Secondary | ICD-10-CM | POA: Diagnosis not present

## 2014-12-24 MED ORDER — LISINOPRIL 40 MG PO TABS: 40.0000 mg | ORAL_TABLET | Freq: Every day | ORAL | Status: DC

## 2014-12-24 MED ORDER — PANTOPRAZOLE SODIUM 40 MG PO TBEC: 40.0000 mg | DELAYED_RELEASE_TABLET | Freq: Every day | ORAL | Status: DC

## 2014-12-24 MED ORDER — NAPROXEN 500 MG PO TABS: 500.0000 mg | ORAL_TABLET | Freq: Two times a day (BID) | ORAL | Status: DC

## 2014-12-27 ENCOUNTER — Encounter: Payer: Self-pay | Admitting: Family Medicine

## 2014-12-27 NOTE — Progress Notes (Signed)
Date:  12/24/2014   Name:  Tyler Glover   DOB:  02/20/1960   MRN:  [id number]  PCP:  No primary care provider on file.    Chief Complaint: Hypertension and Arm Pain   History of Present Illness:  This is a 54 y.o. male with R elbow pain past two months, worse with reptitive motion using computer mouse at work. Recent blood work showed prediabetes and vit D def, exercising more and taking vit D supplement. Needs refill lisinopril/Protonix, wants to postpone GI eval until after school over.   Review of Systems:  Review of Systems  Constitutional: Negative for fever.  Respiratory: Negative for shortness of breath.   Cardiovascular: Negative for chest pain and leg swelling.  Gastrointestinal: Negative for abdominal pain.  Endocrine: Negative for polyuria.  Genitourinary: Negative for difficulty urinating.    Patient Active Problem List   Diagnosis Date Noted  . Vitamin D deficiency 12/24/2014  . Prediabetes 12/24/2014  . Allergic rhinitis, seasonal 06/03/2014  . Acid reflux 06/03/2014  . Essential (primary) hypertension 06/03/2014  . Family history of diabetes mellitus 06/03/2014  . Family history of cardiac disorder 06/03/2014  . Obesity (BMI 30.0-34.9) 06/03/2014    Prior to Admission medications   Medication Sig Start Date End Date Taking? Authorizing Provider  fluticasone (FLONASE) 50 MCG/ACT nasal spray Place 2 sprays into both nostrils daily. 05/13/14  Yes Historical Provider, MD  lisinopril (PRINIVIL,ZESTRIL) 40 MG tablet Take 1 tablet (40 mg total) by mouth daily. 12/24/14  Yes Adline Potter, MD  pantoprazole (PROTONIX) 40 MG tablet Take 1 tablet (40 mg total) by mouth daily. 12/24/14  Yes Adline Potter, MD  naproxen (NAPROSYN) 500 MG tablet Take 1 tablet (500 mg total) by mouth 2 (two) times daily with a meal. 12/24/14   Adline Potter, MD    Allergies  Allergen Reactions  . Penicillins     Told as child not to do injectable, but has taken Amoxicillin since     Past Surgical History  Procedure Laterality Date  . Shoulder surgery      Social History  Substance Use Topics  . Smoking status: Never Smoker   . Smokeless tobacco: Not on file  . Alcohol Use: 0.0 oz/week    0 Standard drinks or equivalent per week    No family history on file.  Medication list has been reviewed and updated.  Physical Examination: BP 130/92 mmHg  Pulse 72  Ht 5\' 11"  (1.803 m)  Wt 250 lb 6.4 oz (113.581 kg)  BMI 34.94 kg/m2  Physical Exam  Constitutional: He appears well-developed and well-nourished.  Cardiovascular: Normal rate, regular rhythm and normal heart sounds.   Pulmonary/Chest: Effort normal and breath sounds normal.  Musculoskeletal: He exhibits no edema.  Tender over R lateral epicondyle  Neurological: He is alert. Coordination normal.  Skin: Skin is warm and dry.  Psychiatric: He has a normal mood and affect. His behavior is normal.  Nursing note and vitals reviewed.   Assessment and Plan:  1. Right lateral epicondylitis NSAID trial, consider cortisone injection if ineffective, ask work about ergonomic changes - naproxen (NAPROSYN) 500 MG tablet; Take 1 tablet (500 mg total) by mouth 2 (two) times daily with a meal.  Dispense: 30 tablet; Refill: 0  2. Essential (primary) hypertension Well controlled, cont lisinopril - lisinopril (PRINIVIL,ZESTRIL) 40 MG tablet; Take 1 tablet (40 mg total) by mouth daily.  Dispense: 90 tablet; Refill: 3  3. Obesity (BMI 30.0-34.9) Continue exercise/weight loss  4. Prediabetes  Unclear control, check a1c - HgB A1c  5. Vitamin D deficiency On supplementation - Vitamin D (25 hydroxy)  6. Gastroesophageal reflux disease, esophagitis presence not specified Sxs well controlled, has failed d/c in past, for GI eval for EGD/colonoscopy this summer - pantoprazole (PROTONIX) 40 MG tablet; Take 1 tablet (40 mg total) by mouth daily.  Dispense: 90 tablet; Refill: 3  7. Allergic rhinitis, seasonal Cont  Flonase  8. Need for tetanus booster - Tdap vaccine greater than or equal to 7yo IM  9. Flu vaccine need - Flu Vaccine QUAD 36+ mos PF IM (Fluarix & Fluzone Quad PF)  Return in about 6 months (around 06/23/2015).  Satira Anis. [facility]  12/27/2014

## 2014-12-27 NOTE — Addendum Note (Signed)
Addended by: Adline Potter on: 12/27/2014 12:15 PM   Modules accepted: Miquel Dunn

## 2015-07-06 ENCOUNTER — Ambulatory Visit: Payer: 59 | Admitting: Family Medicine

## 2015-07-07 ENCOUNTER — Ambulatory Visit (INDEPENDENT_AMBULATORY_CARE_PROVIDER_SITE_OTHER): Payer: 59 | Admitting: Family Medicine

## 2015-07-07 ENCOUNTER — Encounter: Payer: Self-pay | Admitting: Family Medicine

## 2015-07-07 VITALS — BP 128/78 | HR 75 | Resp 16 | Ht 71.0 in | Wt 233.8 lb

## 2015-07-07 DIAGNOSIS — E559 Vitamin D deficiency, unspecified: Secondary | ICD-10-CM

## 2015-07-07 DIAGNOSIS — Z1211 Encounter for screening for malignant neoplasm of colon: Secondary | ICD-10-CM

## 2015-07-07 DIAGNOSIS — M7711 Lateral epicondylitis, right elbow: Secondary | ICD-10-CM

## 2015-07-07 DIAGNOSIS — I10 Essential (primary) hypertension: Secondary | ICD-10-CM

## 2015-07-07 DIAGNOSIS — R7303 Prediabetes: Secondary | ICD-10-CM

## 2015-07-07 DIAGNOSIS — E669 Obesity, unspecified: Secondary | ICD-10-CM | POA: Diagnosis not present

## 2015-07-07 DIAGNOSIS — K219 Gastro-esophageal reflux disease without esophagitis: Secondary | ICD-10-CM | POA: Diagnosis not present

## 2015-07-07 DIAGNOSIS — J302 Other seasonal allergic rhinitis: Secondary | ICD-10-CM

## 2015-07-07 MED ORDER — FLUTICASONE PROPIONATE 50 MCG/ACT NA SUSP: 2.0000 | Freq: Every day | NASAL | Status: DC

## 2015-07-07 MED ORDER — LISINOPRIL 40 MG PO TABS: 40.0000 mg | ORAL_TABLET | Freq: Every day | ORAL | Status: DC

## 2015-07-07 MED ORDER — PANTOPRAZOLE SODIUM 40 MG PO TBEC: 40.0000 mg | DELAYED_RELEASE_TABLET | Freq: Every day | ORAL | Status: DC

## 2015-07-07 MED ORDER — PSEUDOEPHEDRINE HCL ER 120 MG PO TB12: 120.0000 mg | ORAL_TABLET | Freq: Two times a day (BID) | ORAL | Status: DC | PRN

## 2015-07-07 NOTE — Progress Notes (Signed)
Date:  07/07/2015   Name:  Tyler Glover   DOB:  May 29, 1960   MRN:  [id number]  PCP:  No primary care provider on file.    Chief Complaint: Hypertension   History of Present Illness:  This is a 55 y.o. male seen in six month f/u. R lat epicondylitis improved with Naprosyn but starting to flare again. Has lost 17# with exercise/weight loss. Never got blood work done last visit. Taking vit D supplement. AR sxs up and down, using loratidine and Flonase daily and [employer] brand Sudafed 12h prn. GERD sxs improved, wants to try PPI taper. Needs re-referral to GI for screening colonoscopy and possible EGD.  Review of Systems:  Review of Systems  Constitutional: Negative for fever and fatigue.  Respiratory: Negative for cough and shortness of breath.   Cardiovascular: Negative for chest pain and leg swelling.  Endocrine: Negative for polyuria.  Genitourinary: Negative for difficulty urinating.  Neurological: Negative for syncope and light-headedness.    Patient Active Problem List   Diagnosis Date Noted  . Vitamin D deficiency 12/24/2014  . Prediabetes 12/24/2014  . Allergic rhinitis, seasonal 06/03/2014  . Acid reflux 06/03/2014  . Essential (primary) hypertension 06/03/2014  . Family history of diabetes mellitus 06/03/2014  . Family history of cardiac disorder 06/03/2014  . Obesity (BMI 30.0-34.9) 06/03/2014    Prior to Admission medications   Medication Sig Start Date End Date Taking? Authorizing Provider  fluticasone (FLONASE) 50 MCG/ACT nasal spray Place 2 sprays into both nostrils daily. 07/07/15  Yes Adline Potter, MD  lisinopril (PRINIVIL,ZESTRIL) 40 MG tablet Take 1 tablet (40 mg total) by mouth daily. 07/07/15  Yes Adline Potter, MD  loratadine (ALLERGY) 10 MG tablet Take 10 mg by mouth daily.   Yes Historical Provider, MD  naproxen (NAPROSYN) 500 MG tablet Take 1 tablet (500 mg total) by mouth 2 (two) times daily with a meal. 12/24/14  Yes Adline Potter, MD   pantoprazole (PROTONIX) 40 MG tablet Take 1 tablet (40 mg total) by mouth daily. 07/07/15  Yes Adline Potter, MD  pseudoephedrine (SUDAFED) 120 MG 12 hr tablet Take 1 tablet (120 mg total) by mouth every 12 (twelve) hours as needed for congestion. 07/07/15   Adline Potter, MD    Allergies  Allergen Reactions  . Penicillins     Told as child not to do injectable, but has taken Amoxicillin since    Past Surgical History  Procedure Laterality Date  . Shoulder surgery      Social History  Substance Use Topics  . Smoking status: Never Smoker   . Smokeless tobacco: Never Used  . Alcohol Use: 0.0 oz/week    0 Standard drinks or equivalent per week    Family History  Problem Relation Age of Onset  . Family history unknown: Yes    Medication list has been reviewed and updated.  Physical Examination: BP 128/78 mmHg  Pulse 75  Resp 16  Ht 5\' 11"  (1.803 m)  Wt 233 lb 12.8 oz (106.051 kg)  BMI 32.62 kg/m2  SpO2 100%  Physical Exam  Constitutional: He appears well-developed and well-nourished.  Cardiovascular: Normal rate, regular rhythm and normal heart sounds.   Pulmonary/Chest: Effort normal and breath sounds normal.  Musculoskeletal: He exhibits no edema.  Neurological: He is alert.  Skin: Skin is warm and dry.  Psychiatric: He has a normal mood and affect. His behavior is normal.  Nursing note and vitals reviewed.   Assessment and Plan:  1. Right lateral epicondylitis  Flaring again, recommend another trial Naprosyn but avoid LT use, consider ortho referral if sxs persist  2. Essential (primary) hypertension Well controlled on lisinopril, refill  3. Allergic rhinitis, seasonal Marginal control on loratadine/Flonase with Sudafed 12h prn, advised against LT Sudafed use  4. Gastroesophageal reflux disease, esophagitis presence not specified Discussed tapering off PPI over next month with likely rebound sxs  5. Vitamin D deficiency On supplement - Vitamin D (25  hydroxy)  6. Prediabetes Likely improved with weight loss - HgB A1c  7. Obesity (BMI 30.0-34.9) Encouraged continued exercise/weight loss  8. Colon cancer screening Consider EGD as well given PPI-dependent GERD - Ambulatory referral to Gastroenterology   Return in about 6 months (around 01/07/2016).  Satira Anis. [facility]  07/07/2015

## 2015-07-08 ENCOUNTER — Other Ambulatory Visit: Payer: Self-pay | Admitting: Family Medicine

## 2015-07-08 LAB — VITAMIN D 25 HYDROXY (VIT D DEFICIENCY, FRACTURES): Vit D, 25-Hydroxy: 30.1 ng/mL (ref 30.0–100.0)

## 2015-07-08 LAB — HEMOGLOBIN A1C
ESTIMATED AVERAGE GLUCOSE: 120 mg/dL
HEMOGLOBIN A1C: 5.8 % — AB (ref 4.8–5.6)

## 2015-07-08 MED ORDER — VITAMIN D 50 MCG (2000 UT) PO CAPS: 1.0000 | ORAL_CAPSULE | Freq: Every day | ORAL | Status: AC

## 2015-08-03 ENCOUNTER — Ambulatory Visit: Payer: PRIVATE HEALTH INSURANCE | Primary: Family Medicine

## 2015-08-04 ENCOUNTER — Other Ambulatory Visit
Admission: RE | Admit: 2015-08-04 | Discharge: 2015-08-04 | Disposition: A | Discharge status: Home / Self Care | Payer: 59 | Source: Ambulatory Visit | Attending: Family Medicine | Admitting: Family Medicine

## 2015-08-04 DIAGNOSIS — Z13220 Encounter for screening for lipoid disorders: Secondary | ICD-10-CM | POA: Diagnosis not present

## 2015-08-04 LAB — LIPID PANEL
CHOL/HDL RATIO: 4.7 ratio
CHOLESTEROL: 208 mg/dL — AB (ref 0–200)
HDL: 44 mg/dL (ref >40)
LDL CALC: 134 mg/dL — AB (ref 0–99)
Triglycerides: 148 mg/dL (ref <150)
VLDL: 30 mg/dL (ref 0–40)

## 2015-08-05 ENCOUNTER — Other Ambulatory Visit: Payer: Self-pay

## 2015-08-12 ENCOUNTER — Telehealth: Payer: Self-pay

## 2015-08-12 ENCOUNTER — Other Ambulatory Visit: Payer: Self-pay

## 2015-08-12 NOTE — Telephone Encounter (Signed)
Gastroenterology Pre-Procedure Review  Request Date: 09/09/15 Requesting Physician: Dr. Vicente Masson  PATIENT REVIEW QUESTIONS: The patient responded to the following health history questions as indicated:    1. Are you having any GI issues? no 2. Do you have a personal history of Polyps? no 3. Do you have a family history of Colon Cancer or Polyps? no 4. Diabetes Mellitus? no 5. Joint replacements in the past 12 months?no 6. Major health problems in the past 3 months?no 7. Any artificial heart valves, MVP, or defibrillator?no    MEDICATIONS & ALLERGIES:    Patient reports the following regarding taking any anticoagulation/antiplatelet therapy:   Plavix, Coumadin, Eliquis, Xarelto, Lovenox, Pradaxa, Brilinta, or Effient? no Aspirin? no  Patient confirms/reports the following medications:  Current Outpatient Prescriptions  Medication Sig Dispense Refill  . Cholecalciferol (VITAMIN D) 2000 units CAPS Take 1 capsule (2,000 Units total) by mouth daily. 30 capsule   . fluticasone (FLONASE) 50 MCG/ACT nasal spray Place 2 sprays into both nostrils daily. 16 g 2  . lisinopril (PRINIVIL,ZESTRIL) 40 MG tablet Take 1 tablet (40 mg total) by mouth daily. 90 tablet 3  . loratadine (ALLERGY) 10 MG tablet Take 10 mg by mouth daily.    . naproxen (NAPROSYN) 500 MG tablet Take 1 tablet (500 mg total) by mouth 2 (two) times daily with a meal. 30 tablet 0  . pantoprazole (PROTONIX) 40 MG tablet Take 1 tablet (40 mg total) by mouth daily. 90 tablet 3  . pseudoephedrine (SUDAFED) 120 MG 12 hr tablet Take 1 tablet (120 mg total) by mouth every 12 (twelve) hours as needed for congestion. 30 tablet 2   No current facility-administered medications for this visit.    Patient confirms/reports the following allergies:  Allergies  Allergen Reactions  . Penicillins     Told as child not to do injectable, but has taken Amoxicillin since    No orders of the defined types were placed in this encounter.     AUTHORIZATION INFORMATION Primary Insurance: 1D#: Group #:  Secondary Insurance: 1D#: Group #:  SCHEDULE INFORMATION: Date: 09/09/15 Time: Location: [facility]

## 2015-09-09 ENCOUNTER — Encounter: Admission: RE | Payer: Self-pay | Source: Ambulatory Visit

## 2015-09-09 ENCOUNTER — Ambulatory Visit: Admission: RE | Admit: 2015-09-09 | Payer: 59 | Source: Ambulatory Visit | Admitting: Gastroenterology

## 2015-09-09 SURGERY — COLONOSCOPY WITH PROPOFOL
Anesthesia: Choice

## 2016-01-26 ENCOUNTER — Encounter: Payer: Self-pay | Admitting: Family Medicine

## 2016-01-26 ENCOUNTER — Other Ambulatory Visit: Payer: Self-pay | Admitting: Family Medicine

## 2016-01-26 ENCOUNTER — Ambulatory Visit (INDEPENDENT_AMBULATORY_CARE_PROVIDER_SITE_OTHER): Payer: 59 | Admitting: Family Medicine

## 2016-01-26 VITALS — BP 124/90 | HR 72 | Ht 71.0 in | Wt 217.0 lb

## 2016-01-26 DIAGNOSIS — Z0001 Encounter for general adult medical examination with abnormal findings: Secondary | ICD-10-CM | POA: Diagnosis not present

## 2016-01-26 DIAGNOSIS — K219 Gastro-esophageal reflux disease without esophagitis: Secondary | ICD-10-CM | POA: Diagnosis not present

## 2016-01-26 DIAGNOSIS — Z1159 Encounter for screening for other viral diseases: Secondary | ICD-10-CM

## 2016-01-26 DIAGNOSIS — R351 Nocturia: Secondary | ICD-10-CM | POA: Diagnosis not present

## 2016-01-26 DIAGNOSIS — E7800 Pure hypercholesterolemia, unspecified: Secondary | ICD-10-CM | POA: Diagnosis not present

## 2016-01-26 DIAGNOSIS — Z0000 Encounter for general adult medical examination without abnormal findings: Secondary | ICD-10-CM

## 2016-01-26 DIAGNOSIS — Z114 Encounter for screening for human immunodeficiency virus [HIV]: Secondary | ICD-10-CM

## 2016-01-26 DIAGNOSIS — Z23 Encounter for immunization: Secondary | ICD-10-CM

## 2016-01-26 DIAGNOSIS — Z1211 Encounter for screening for malignant neoplasm of colon: Secondary | ICD-10-CM

## 2016-01-26 DIAGNOSIS — I10 Essential (primary) hypertension: Secondary | ICD-10-CM | POA: Diagnosis not present

## 2016-01-26 LAB — HEMOCCULT GUIAC POC 1CARD (OFFICE): FECAL OCCULT BLD: NEGATIVE

## 2016-01-26 NOTE — Progress Notes (Signed)
Name: Tyler Glover   MRN: [id number]    DOB: 10/10/1960   Date:01/26/2016       Progress Note  Subjective  Chief Complaint  Chief Complaint  Patient presents with  . Annual Exam    flu shot  . Hypertension  . Gastroesophageal Reflux  . Allergic Rhinitis     Hypertension  This is a chronic problem. The current episode started more than 1 year ago. The problem has been waxing and waning since onset. The problem is controlled. Pertinent negatives include no anxiety, blurred vision, chest pain, headaches, malaise/fatigue, neck pain, orthopnea, palpitations, peripheral edema, PND, shortness of breath or sweats. There are no associated agents to hypertension. There are no known risk factors for coronary artery disease. The current treatment provides mild improvement. There are no compliance problems.  There is no history of angina, kidney disease, CAD/MI, CVA, heart failure, left ventricular hypertrophy, PVD, renovascular disease or retinopathy. There is no history of chronic renal disease or a hypertension causing med.  Gastroesophageal Reflux  He reports no abdominal pain, no belching, no chest pain, no choking, no coughing, no dysphagia, no early satiety, no globus sensation, no heartburn, no hoarse voice, no nausea, no sore throat, no stridor, no tooth decay, no water brash or no wheezing. This is a recurrent problem. The current episode started yesterday. The problem has been gradually improving. The symptoms are aggravated by certain foods. Pertinent negatives include no anemia, fatigue, melena, muscle weakness, orthopnea or weight loss. There are no known risk factors. He has tried a PPI for the symptoms.    No problem-specific Assessment & Plan notes found for this encounter.   Past Medical History:  Diagnosis Date  . Allergy   . GERD (gastroesophageal reflux disease)   . Hypertension     Past Surgical History:  Procedure Laterality Date  . SHOULDER SURGERY      Family  History  Problem Relation Age of Onset  . Family history unknown: Yes    Social History   Social History  . Marital status: Married    Spouse name: N/A  . Number of children: N/A  . Years of education: N/A   Occupational History  . Not on file.   Social History Main Topics  . Smoking status: Never Smoker  . Smokeless tobacco: Never Used  . Alcohol use 0.0 oz/week  . Drug use: No  . Sexual activity: Not on file   Other Topics Concern  . Not on file   Social History Narrative  . No narrative on file    Allergies  Allergen Reactions  . Penicillins     Told as child not to do injectable, but has taken Amoxicillin since     Review of Systems  Constitutional: Negative for chills, fatigue, fever, malaise/fatigue and weight loss.  HENT: Negative for ear discharge, ear pain, hoarse voice and sore throat.   Eyes: Negative for blurred vision.  Respiratory: Negative for cough, sputum production, choking, shortness of breath and wheezing.   Cardiovascular: Negative for chest pain, palpitations, orthopnea, leg swelling and PND.  Gastrointestinal: Negative for abdominal pain, blood in stool, constipation, diarrhea, dysphagia, heartburn, melena and nausea.  Genitourinary: Negative for dysuria, flank pain, frequency, hematuria and urgency.       Hesitancy / same stream/ nocturia  Musculoskeletal: Negative for back pain, joint pain, myalgias, muscle weakness and neck pain.  Skin: Negative for rash.  Neurological: Negative for dizziness, tingling, sensory change, focal weakness and headaches.  Endo/Heme/Allergies:  Negative for environmental allergies and polydipsia. Does not bruise/bleed easily.  Psychiatric/Behavioral: Negative for depression and suicidal ideas. The patient is not nervous/anxious and does not have insomnia.      Objective  Vitals:   01/26/16 0937  BP: 124/90  Pulse: 72  Weight: 217 lb (98.4 kg)  Height: 5\' 11"  (1.803 m)    Physical Exam  Constitutional:  He is oriented to person, place, and time and well-developed, well-nourished, and in no distress. Vital signs are normal.  HENT:  Head: Normocephalic and atraumatic.  Right Ear: Hearing, tympanic membrane, external ear and ear canal normal.  Left Ear: Hearing, tympanic membrane, external ear and ear canal normal.  Nose: Nose normal.  Mouth/Throat: Uvula is midline, oropharynx is clear and moist and mucous membranes are normal.  Eyes: Conjunctivae, EOM and lids are normal. Pupils are equal, round, and reactive to light. Right eye exhibits no discharge. Left eye exhibits no discharge. No scleral icterus.  Fundoscopic exam:      The right eye shows no arteriolar narrowing and no AV nicking.       The left eye shows no arteriolar narrowing and no AV nicking.  Neck: Trachea normal and normal range of motion. Neck supple. Normal carotid pulses, no hepatojugular reflux and no JVD present. No spinous process tenderness and no muscular tenderness present. Carotid bruit is not present. No tracheal deviation present. No thyroid mass and no thyromegaly present.  Cardiovascular: Normal rate, regular rhythm, S1 normal, S2 normal, normal heart sounds, intact distal pulses and normal pulses.  PMI is not displaced.  Exam reveals no gallop, no S3, no S4, no distant heart sounds, no friction rub and no decreased pulses.   No murmur heard. Pulmonary/Chest: Effort normal and breath sounds normal. No respiratory distress. He has no wheezes. He has no rales. Right breast exhibits no mass. Left breast exhibits no mass.  Abdominal: Soft. Normal aorta and bowel sounds are normal. He exhibits no mass. There is no hepatosplenomegaly. There is no tenderness. There is no rebound, no guarding and no CVA tenderness.  Genitourinary: Rectum normal, prostate normal and testes/scrotum normal. Rectal exam shows guaiac negative stool.  Musculoskeletal: Normal range of motion. He exhibits no edema or tenderness.  Lymphadenopathy:        Head (right side): No submental and no submandibular adenopathy present.       Head (left side): No submental and no submandibular adenopathy present.    He has no cervical adenopathy.    He has no axillary adenopathy.  Neurological: He is alert and oriented to person, place, and time. He has normal motor skills, normal sensation, normal strength, normal reflexes and intact cranial nerves. No cranial nerve deficit.  Skin: Skin is warm, dry and intact. No rash noted. No pallor.  Psychiatric: Mood and affect normal.  Nursing note and vitals reviewed.     Assessment & Plan  Problem List Items Addressed This Visit      Cardiovascular and Mediastinum   Essential (primary) hypertension   Relevant Orders   Renal Function Panel     Digestive   Acid reflux     Other   Pure hypercholesterolemia   Relevant Orders   Lipid Profile    Other Visit Diagnoses    Annual physical exam    -  Primary   Nocturia       Relevant Orders   PSA   Colon cancer screening       Relevant Orders   POCT Occult  Blood Stool   Ambulatory referral to Gastroenterology   Immunization due       Relevant Orders   Flu Vaccine QUAD 36+ mos PF IM (Fluarix & Fluzone Quad PF) (Completed)   Need for hepatitis C screening test       Screening for HIV (human immunodeficiency virus)            Dr. [facility]  01/26/16

## 2016-01-26 NOTE — Patient Instructions (Signed)

## 2016-01-27 LAB — RENAL FUNCTION PANEL
Albumin: 5 g/dL (ref 3.5–5.5)
BUN / CREAT RATIO: 16 (ref 9–20)
BUN: 16 mg/dL (ref 6–24)
CHLORIDE: 100 mmol/L (ref 96–106)
CO2: 24 mmol/L (ref 18–29)
Calcium: 9.5 mg/dL (ref 8.7–10.2)
Creatinine, Ser: 0.98 mg/dL (ref 0.76–1.27)
GFR, EST AFRICAN AMERICAN: 101 mL/min/{1.73_m2} (ref >59)
GFR, EST NON AFRICAN AMERICAN: 88 mL/min/{1.73_m2} (ref >59)
GLUCOSE: 80 mg/dL (ref 65–99)
POTASSIUM: 4.3 mmol/L (ref 3.5–5.2)
Phosphorus: 2.9 mg/dL (ref 2.5–4.5)
SODIUM: 141 mmol/L (ref 134–144)

## 2016-01-27 LAB — LIPID PANEL
CHOL/HDL RATIO: 3.9 ratio (ref 0.0–5.0)
Cholesterol, Total: 209 mg/dL — ABNORMAL HIGH (ref 100–199)
HDL: 53 mg/dL (ref >39)
LDL Calculated: 133 mg/dL — ABNORMAL HIGH (ref 0–99)
Triglycerides: 113 mg/dL (ref 0–149)
VLDL Cholesterol Cal: 23 mg/dL (ref 5–40)

## 2016-01-27 LAB — PSA: PROSTATE SPECIFIC AG, SERUM: 1 ng/mL (ref 0.0–4.0)

## 2016-05-18 ENCOUNTER — Other Ambulatory Visit: Payer: Self-pay | Admitting: Family Medicine

## 2016-08-03 ENCOUNTER — Other Ambulatory Visit: Payer: Self-pay

## 2016-08-03 ENCOUNTER — Other Ambulatory Visit: Payer: Self-pay | Admitting: Family Medicine

## 2016-08-03 DIAGNOSIS — I10 Essential (primary) hypertension: Secondary | ICD-10-CM

## 2016-08-03 MED ORDER — LISINOPRIL 40 MG PO TABS: 40.0000 mg | ORAL_TABLET | Freq: Every day | ORAL | 0 refills | Status: DC

## 2016-08-13 ENCOUNTER — Ambulatory Visit (INDEPENDENT_AMBULATORY_CARE_PROVIDER_SITE_OTHER): Payer: 59 | Admitting: Family Medicine

## 2016-08-13 ENCOUNTER — Encounter: Payer: Self-pay | Admitting: Family Medicine

## 2016-08-13 VITALS — BP 110/78 | HR 68 | Ht 71.0 in | Wt 216.0 lb

## 2016-08-13 DIAGNOSIS — Z1211 Encounter for screening for malignant neoplasm of colon: Secondary | ICD-10-CM

## 2016-08-13 DIAGNOSIS — K219 Gastro-esophageal reflux disease without esophagitis: Secondary | ICD-10-CM | POA: Diagnosis not present

## 2016-08-13 DIAGNOSIS — I10 Essential (primary) hypertension: Secondary | ICD-10-CM

## 2016-08-13 MED ORDER — PANTOPRAZOLE SODIUM 40 MG PO TBEC: 40.0000 mg | DELAYED_RELEASE_TABLET | Freq: Every day | ORAL | 3 refills | Status: DC

## 2016-08-13 MED ORDER — LORATADINE 10 MG PO TABS: 10.0000 mg | ORAL_TABLET | Freq: Every day | ORAL | 5 refills | Status: DC

## 2016-08-13 MED ORDER — LISINOPRIL 40 MG PO TABS: 40.0000 mg | ORAL_TABLET | Freq: Every day | ORAL | 3 refills | Status: DC

## 2016-08-13 MED ORDER — FLUTICASONE PROPIONATE 50 MCG/ACT NA SUSP: NASAL | 11 refills | Status: DC

## 2016-08-13 NOTE — Progress Notes (Signed)
Name: Tyler Glover   MRN: [id number]    DOB: [birth year]   Date:08/13/2016       Progress Note  Subjective  Chief Complaint  Chief Complaint  Patient presents with  . Gastroesophageal Reflux  . Allergic Rhinitis   . Hypertension    Gastroesophageal Reflux  He complains of heartburn. He reports no abdominal pain, no belching, no chest pain, no choking, no coughing, no dysphagia, no early satiety, no hoarse voice, no nausea, no sore throat, no stridor, no tooth decay or no wheezing. This is a recurrent problem. The problem occurs occasionally. The heartburn duration is less than a minute. The heartburn is located in the substernum. The symptoms are aggravated by certain foods. Pertinent negatives include no anemia, fatigue, melena, muscle weakness, orthopnea or weight loss. He has tried a PPI for the symptoms. The treatment provided mild relief.  Hypertension  This is a chronic problem. The current episode started more than 1 year ago. The problem has been gradually improving since onset. The problem is controlled. Pertinent negatives include no anxiety, blurred vision, chest pain, headaches, malaise/fatigue, neck pain, orthopnea, palpitations, peripheral edema, PND, shortness of breath or sweats. Past treatments include ACE inhibitors. The current treatment provides moderate improvement. There is no history of angina, kidney disease, CAD/MI, CVA, heart failure, left ventricular hypertrophy, PVD or retinopathy. There is no history of chronic renal disease, a hypertension causing med or renovascular disease.    No problem-specific Assessment & Plan notes found for this encounter.   Past Medical History:  Diagnosis Date  . Allergy   . GERD (gastroesophageal reflux disease)   . Hypertension     Past Surgical History:  Procedure Laterality Date  . SHOULDER SURGERY      Family History  Problem Relation Age of Onset  . Family history unknown: Yes    Social History   Social History   . Marital status: Married    Spouse name: N/A  . Number of children: N/A  . Years of education: N/A   Occupational History  . Not on file.   Social History Main Topics  . Smoking status: Never Smoker  . Smokeless tobacco: Never Used  . Alcohol use 0.0 oz/week  . Drug use: No  . Sexual activity: Not on file   Other Topics Concern  . Not on file   Social History Narrative  . No narrative on file    Allergies  Allergen Reactions  . Penicillins     Told as child not to do injectable, but has taken Amoxicillin since    Outpatient Medications Prior to Visit  Medication Sig Dispense Refill  . b complex vitamins tablet Take 1 tablet by mouth daily.    . Cholecalciferol (VITAMIN D) 2000 units CAPS Take 1 capsule (2,000 Units total) by mouth daily. 30 capsule   . naproxen (NAPROSYN) 500 MG tablet Take 1 tablet (500 mg total) by mouth 2 (two) times daily with a meal. (Patient taking differently: Take 500 mg by mouth 2 (two) times daily with a meal. otc) 30 tablet 0  . fluticasone (FLONASE) 50 MCG/ACT nasal spray SPRAY ONCE IN EACH NOSTRIL DAILY 16 g 11  . lisinopril (PRINIVIL,ZESTRIL) 40 MG tablet TAKE 1 TABLET BY MOUTH DAILY 30 tablet 0  . loratadine (ALLERGY) 10 MG tablet Take 10 mg by mouth daily.    . pantoprazole (PROTONIX) 40 MG tablet Take 1 tablet (40 mg total) by mouth daily. 90 tablet 3  . pseudoephedrine (SUDAFED) 120  MG 12 hr tablet Take 1 tablet (120 mg total) by mouth every 12 (twelve) hours as needed for congestion. 30 tablet 2   No facility-administered medications prior to visit.     Review of Systems  Constitutional: Negative for chills, fatigue, fever, malaise/fatigue and weight loss.  HENT: Negative for ear discharge, ear pain, hoarse voice and sore throat.   Eyes: Negative for blurred vision.  Respiratory: Negative for cough, sputum production, choking, shortness of breath and wheezing.   Cardiovascular: Negative for chest pain, palpitations, orthopnea, leg  swelling and PND.  Gastrointestinal: Positive for heartburn. Negative for abdominal pain, blood in stool, constipation, diarrhea, dysphagia, melena and nausea.  Genitourinary: Negative for dysuria, frequency, hematuria and urgency.  Musculoskeletal: Negative for back pain, joint pain, myalgias, muscle weakness and neck pain.  Skin: Negative for rash.  Neurological: Negative for dizziness, tingling, sensory change, focal weakness and headaches.  Endo/Heme/Allergies: Negative for environmental allergies and polydipsia. Does not bruise/bleed easily.  Psychiatric/Behavioral: Negative for depression and suicidal ideas. The patient is not nervous/anxious and does not have insomnia.      Objective  Vitals:   08/13/16 1047  BP: 110/78  Pulse: 68  Weight: 216 lb (98 kg)  Height: 5\' 11"  (1.803 m)    Physical Exam  Constitutional: He is oriented to person, place, and time and well-developed, well-nourished, and in no distress.  HENT:  Head: Normocephalic.  Right Ear: External ear normal.  Left Ear: External ear normal.  Nose: Nose normal.  Mouth/Throat: Oropharynx is clear and moist.  Eyes: Conjunctivae and EOM are normal. Pupils are equal, round, and reactive to light. Right eye exhibits no discharge. Left eye exhibits no discharge. No scleral icterus.  Neck: Normal range of motion. Neck supple. No JVD present. No tracheal deviation present. No thyromegaly present.  Cardiovascular: Normal rate, regular rhythm, normal heart sounds and intact distal pulses.  Exam reveals no gallop and no friction rub.   No murmur heard. Pulmonary/Chest: Breath sounds normal. No respiratory distress. He has no wheezes. He has no rales.  Abdominal: Soft. Bowel sounds are normal. He exhibits no mass. There is no hepatosplenomegaly. There is no tenderness. There is no rebound, no guarding and no CVA tenderness.  Musculoskeletal: Normal range of motion. He exhibits no edema or tenderness.  Lymphadenopathy:    He  has no cervical adenopathy.  Neurological: He is alert and oriented to person, place, and time. He has normal sensation, normal strength, normal reflexes and intact cranial nerves. No cranial nerve deficit.  Skin: Skin is warm. No rash noted.  Psychiatric: Mood and affect normal.  Nursing note and vitals reviewed.     Assessment & Plan  Problem List Items Addressed This Visit      Cardiovascular and Mediastinum   Essential (primary) hypertension   Relevant Medications   lisinopril (PRINIVIL,ZESTRIL) 40 MG tablet   Other Relevant Orders   Lipid Profile   Renal Function Panel     Digestive   Acid reflux   Relevant Medications   pantoprazole (PROTONIX) 40 MG tablet   Other Relevant Orders   Ambulatory referral to Gastroenterology    Other Visit Diagnoses    Colon cancer screening    -  Primary   Relevant Orders   Ambulatory referral to Gastroenterology      Meds ordered this encounter  Medications  . lisinopril (PRINIVIL,ZESTRIL) 40 MG tablet    Sig: Take 1 tablet (40 mg total) by mouth daily.    Dispense:  90  tablet    Refill:  3    Needs to be seen  . pantoprazole (PROTONIX) 40 MG tablet    Sig: Take 1 tablet (40 mg total) by mouth daily.    Dispense:  90 tablet    Refill:  3  . fluticasone (FLONASE) 50 MCG/ACT nasal spray    Sig: SPRAY ONCE IN EACH NOSTRIL DAILY    Dispense:  16 g    Refill:  11  . loratadine (ALLERGY) 10 MG tablet    Sig: Take 1 tablet (10 mg total) by mouth daily.    Dispense:  30 tablet    Refill:  5      Dr. Otilio Miu [facility]  08/13/16

## 2016-08-14 ENCOUNTER — Other Ambulatory Visit: Payer: Self-pay | Admitting: Family Medicine

## 2016-08-14 LAB — LIPID PANEL
CHOL/HDL RATIO: 3.8 ratio (ref 0.0–5.0)
CHOLESTEROL TOTAL: 201 mg/dL — AB (ref 100–199)
HDL: 53 mg/dL (ref >39)
LDL Calculated: 126 mg/dL — ABNORMAL HIGH (ref 0–99)
Triglycerides: 108 mg/dL (ref 0–149)
VLDL Cholesterol Cal: 22 mg/dL (ref 5–40)

## 2016-08-14 LAB — RENAL FUNCTION PANEL
ALBUMIN: 4.7 g/dL (ref 3.5–5.5)
BUN/Creatinine Ratio: 17 (ref 9–20)
BUN: 15 mg/dL (ref 6–24)
CALCIUM: 9.3 mg/dL (ref 8.7–10.2)
CO2: 24 mmol/L (ref 20–29)
Chloride: 103 mmol/L (ref 96–106)
Creatinine, Ser: 0.9 mg/dL (ref 0.76–1.27)
GFR calc Af Amer: 112 mL/min/{1.73_m2} (ref >59)
GFR calc non Af Amer: 97 mL/min/{1.73_m2} (ref >59)
Glucose: 85 mg/dL (ref 65–99)
PHOSPHORUS: 2.8 mg/dL (ref 2.5–4.5)
Potassium: 4.6 mmol/L (ref 3.5–5.2)
SODIUM: 143 mmol/L (ref 134–144)

## 2016-11-26 ENCOUNTER — Ambulatory Visit (INDEPENDENT_AMBULATORY_CARE_PROVIDER_SITE_OTHER): Payer: 59

## 2016-11-26 DIAGNOSIS — Z23 Encounter for immunization: Secondary | ICD-10-CM | POA: Diagnosis not present

## 2017-01-28 ENCOUNTER — Encounter: Payer: 59 | Admitting: Family Medicine

## 2017-02-07 ENCOUNTER — Ambulatory Visit: Payer: 59 | Admitting: Family Medicine

## 2017-05-16 ENCOUNTER — Other Ambulatory Visit: Payer: Self-pay | Admitting: Family Medicine

## 2017-08-19 ENCOUNTER — Other Ambulatory Visit: Payer: Self-pay | Admitting: Family Medicine

## 2017-08-19 DIAGNOSIS — I10 Essential (primary) hypertension: Secondary | ICD-10-CM

## 2017-08-22 ENCOUNTER — Encounter: Payer: Self-pay | Admitting: Family Medicine

## 2017-08-22 ENCOUNTER — Ambulatory Visit (INDEPENDENT_AMBULATORY_CARE_PROVIDER_SITE_OTHER): Payer: 59 | Admitting: Family Medicine

## 2017-08-22 VITALS — BP 119/81 | HR 78 | Resp 16 | Ht 71.0 in | Wt 230.0 lb

## 2017-08-22 DIAGNOSIS — Z1159 Encounter for screening for other viral diseases: Secondary | ICD-10-CM

## 2017-08-22 DIAGNOSIS — J301 Allergic rhinitis due to pollen: Secondary | ICD-10-CM

## 2017-08-22 DIAGNOSIS — I10 Essential (primary) hypertension: Secondary | ICD-10-CM | POA: Diagnosis not present

## 2017-08-22 DIAGNOSIS — K219 Gastro-esophageal reflux disease without esophagitis: Secondary | ICD-10-CM | POA: Diagnosis not present

## 2017-08-22 DIAGNOSIS — E7800 Pure hypercholesterolemia, unspecified: Secondary | ICD-10-CM

## 2017-08-22 DIAGNOSIS — R7303 Prediabetes: Secondary | ICD-10-CM

## 2017-08-22 DIAGNOSIS — Z114 Encounter for screening for human immunodeficiency virus [HIV]: Secondary | ICD-10-CM

## 2017-08-22 DIAGNOSIS — Z1211 Encounter for screening for malignant neoplasm of colon: Secondary | ICD-10-CM

## 2017-08-22 MED ORDER — LORATADINE 10 MG PO TABS: 10.0000 mg | ORAL_TABLET | Freq: Every day | ORAL | 3 refills | Status: DC

## 2017-08-22 MED ORDER — PANTOPRAZOLE SODIUM 40 MG PO TBEC: 40.0000 mg | DELAYED_RELEASE_TABLET | Freq: Every day | ORAL | 3 refills | Status: DC

## 2017-08-22 MED ORDER — LISINOPRIL 40 MG PO TABS: 40.0000 mg | ORAL_TABLET | Freq: Every day | ORAL | 1 refills | Status: DC

## 2017-08-22 MED ORDER — FLUTICASONE PROPIONATE 50 MCG/ACT NA SUSP: NASAL | 11 refills | Status: DC

## 2017-08-22 NOTE — Assessment & Plan Note (Addendum)
Chronic Controlled will decrease Prinivil to 20 MG daily. Check renal panel.

## 2017-08-22 NOTE — Assessment & Plan Note (Signed)
Chronic Stable Continue pantoprozole 40 mg daily.

## 2017-08-22 NOTE — Assessment & Plan Note (Signed)
Controlled. Will continue loratadine 10 mg and fluticasone daily prn seasonal allergies.

## 2017-08-22 NOTE — Progress Notes (Signed)
Name: Tyler Glover   MRN: [id number]    DOB: 19-Jul-1960   Date:08/22/2017       Progress Note  Subjective  Chief Complaint  Chief Complaint  Patient presents with  . Hypertension    Hypertension  This is a chronic problem. The current episode started more than 1 year ago. The problem is unchanged. The problem is controlled. Pertinent negatives include no anxiety, blurred vision, chest pain, headaches, malaise/fatigue, neck pain, orthopnea, palpitations, peripheral edema, PND, shortness of breath or sweats. There are no associated agents to hypertension. There are no known risk factors for coronary artery disease. Past treatments include ACE inhibitors.  Hyperlipidemia  This is a chronic problem. The problem is controlled. Exacerbating diseases include obesity. He has no history of hypothyroidism. Pertinent negatives include no chest pain, focal sensory loss, focal weakness, myalgias or shortness of breath. Current antihyperlipidemic treatment includes diet change.  Diabetes  He presents for his follow-up (prediabetes) diabetic visit. Diabetes type: prediabetes. His disease course has been stable. Pertinent negatives for hypoglycemia include no dizziness, headaches, nervousness/anxiousness or sweats. Pertinent negatives for diabetes include no blurred vision, no chest pain, no polydipsia, no polyphagia, no polyuria and no weight loss. Risk factors for coronary artery disease include dyslipidemia, hypertension and male sex. Current diabetic treatment includes diet.    Essential (primary) hypertension Chronic Controlled will decrease Prinivil to 20 MG daily. Check renal panel.  Acid reflux Chronic Stable Continue pantoprozole 40 mg daily.  Allergic rhinitis, seasonal Controlled. Will continue loratadine 10 mg and fluticasone daily prn seasonal allergies.  Prediabetes Patient notes previous previous hyperglycemia episodes. Will check A1C.  Pure hypercholesterolemia With history  hyperlipidemia/ and prediabetes will check lipid panel.   Past Medical History:  Diagnosis Date  . Allergy   . GERD (gastroesophageal reflux disease)   . Hypertension     Past Surgical History:  Procedure Laterality Date  . SHOULDER SURGERY      Family History  Family history unknown: Yes    Social History   Socioeconomic History  . Marital status: Married    Spouse name: Not on file  . Number of children: Not on file  . Years of education: Not on file  . Highest education level: Not on file  Occupational History  . Not on file  Social Needs  . Financial resource strain: Not on file  . Food insecurity:    Worry: Not on file    Inability: Not on file  . Transportation needs:    Medical: Not on file    Non-medical: Not on file  Tobacco Use  . Smoking status: Never Smoker  . Smokeless tobacco: Never Used  Substance and Sexual Activity  . Alcohol use: Yes    Alcohol/week: 0.0 oz  . Drug use: No  . Sexual activity: Not on file  Lifestyle  . Physical activity:    Days per week: Not on file    Minutes per session: Not on file  . Stress: Not on file  Relationships  . Social connections:    Talks on phone: Not on file    Gets together: Not on file    Attends religious service: Not on file    Active member of club or organization: Not on file    Attends meetings of clubs or organizations: Not on file    Relationship status: Not on file  . Intimate partner violence:    Fear of current or ex partner: Not on file    Emotionally  abused: Not on file    Physically abused: Not on file    Forced sexual activity: Not on file  Other Topics Concern  . Not on file  Social History Narrative  . Not on file    Allergies  Allergen Reactions  . Penicillins     Told as child not to do injectable, but has taken Amoxicillin since    Outpatient Medications Prior to Visit  Medication Sig Dispense Refill  . b complex vitamins tablet Take 1 tablet by mouth daily.    .  Cholecalciferol (VITAMIN D) 2000 units CAPS Take 1 capsule (2,000 Units total) by mouth daily. 30 capsule   . naproxen (NAPROSYN) 500 MG tablet Take 1 tablet (500 mg total) by mouth 2 (two) times daily with a meal. (Patient taking differently: Take 500 mg by mouth 2 (two) times daily with a meal. otc) 30 tablet 0  . fluticasone (FLONASE) 50 MCG/ACT nasal spray SPRAY ONCE IN EACH NOSTRIL DAILY 16 g 11  . lisinopril (PRINIVIL,ZESTRIL) 40 MG tablet TAKE 1 TABLET BY MOUTH DAILY 15 tablet 0  . loratadine (CLARITIN) 10 MG tablet TAKE 1 TABLET BY MOUTH ONCE DAILY 90 tablet 3  . pantoprazole (PROTONIX) 40 MG tablet Take 1 tablet (40 mg total) by mouth daily. 90 tablet 3  . WAL-PHED 12 HOUR 120 MG 12 hr tablet TAKE 1 TABLET BY MOUTH EVERY 12 HOURS AS NEEDED FOR CONGESTION 30 tablet 5   No facility-administered medications prior to visit.     Review of Systems  Constitutional: Negative for chills, fever, malaise/fatigue and weight loss.  HENT: Negative for ear discharge, ear pain and sore throat.   Eyes: Negative for blurred vision.  Respiratory: Negative for cough, sputum production, shortness of breath and wheezing.   Cardiovascular: Negative for chest pain, palpitations, orthopnea, leg swelling and PND.  Gastrointestinal: Negative for abdominal pain, blood in stool, constipation, diarrhea, heartburn, melena and nausea.  Genitourinary: Negative for dysuria, frequency, hematuria and urgency.  Musculoskeletal: Negative for back pain, joint pain, myalgias and neck pain.  Skin: Negative for rash.  Neurological: Negative for dizziness, tingling, sensory change, focal weakness and headaches.  Endo/Heme/Allergies: Negative for environmental allergies, polydipsia and polyphagia. Does not bruise/bleed easily.  Psychiatric/Behavioral: Negative for depression and suicidal ideas. The patient is not nervous/anxious and does not have insomnia.      Objective  Vitals:   08/22/17 0913  BP: 119/81  Pulse: 78   Resp: 16  SpO2: 98%  Weight: 230 lb (104.3 kg)  Height: 5\' 11"  (1.803 m)    Physical Exam  Constitutional: He is oriented to person, place, and time.  HENT:  Head: Normocephalic.  Right Ear: External ear normal.  Left Ear: External ear normal.  Nose: Nose normal.  Mouth/Throat: Oropharynx is clear and moist.  Eyes: Pupils are equal, round, and reactive to light. Conjunctivae and EOM are normal. Right eye exhibits no discharge. Left eye exhibits no discharge. No scleral icterus.  Neck: Normal range of motion. Neck supple. No JVD present. No tracheal deviation present. No thyromegaly present.  Cardiovascular: Normal rate, regular rhythm, normal heart sounds and intact distal pulses. Exam reveals no gallop and no friction rub.  No murmur heard. Pulmonary/Chest: Breath sounds normal. No respiratory distress. He has no wheezes. He has no rales.  Abdominal: Soft. Bowel sounds are normal. He exhibits no mass. There is no hepatosplenomegaly. There is no tenderness. There is no rebound, no guarding and no CVA tenderness.  Genitourinary: Rectum normal and  prostate normal. Rectal exam shows guaiac negative stool.  Musculoskeletal: Normal range of motion. He exhibits no edema or tenderness.  Lymphadenopathy:    He has no cervical adenopathy.  Neurological: He is alert and oriented to person, place, and time. He has normal strength and normal reflexes. No cranial nerve deficit.  Skin: Skin is warm. No rash noted.  Nursing note and vitals reviewed.     Assessment & Plan  Problem List Items Addressed This Visit      Cardiovascular and Mediastinum   Essential (primary) hypertension - Primary    Chronic Controlled will decrease Prinivil to 20 MG daily. Check renal panel.      Relevant Medications   lisinopril (PRINIVIL,ZESTRIL) 40 MG tablet   Other Relevant Orders   Renal Function Panel     Respiratory   Allergic rhinitis, seasonal    Controlled. Will continue loratadine 10 mg and  fluticasone daily prn seasonal allergies.      Relevant Medications   loratadine (CLARITIN) 10 MG tablet   fluticasone (FLONASE) 50 MCG/ACT nasal spray     Digestive   Acid reflux    Chronic Stable Continue pantoprozole 40 mg daily.      Relevant Medications   pantoprazole (PROTONIX) 40 MG tablet     Other   Prediabetes    Patient notes previous previous hyperglycemia episodes. Will check A1C.      Relevant Orders   Hemoglobin A1c   Lipid panel   Pure hypercholesterolemia    With history hyperlipidemia/ and prediabetes will check lipid panel.      Relevant Medications   lisinopril (PRINIVIL,ZESTRIL) 40 MG tablet   Other Relevant Orders   Lipid panel    Other Visit Diagnoses    Need for hepatitis C screening test       Screen as determined by criterea.   Relevant Orders   Hepatitis C antibody   Encounter for screening for HIV       Patient agrees to sceen for HIV   Relevant Orders   HIV antibody   Colon cancer screening       Patient desires screen per criterea. Referral to Dr Allen Norris.   Relevant Orders   Ambulatory referral to Gastroenterology      Meds ordered this encounter  Medications  . lisinopril (PRINIVIL,ZESTRIL) 40 MG tablet    Sig: Take 1 tablet (40 mg total) by mouth daily.    Dispense:  90 tablet    Refill:  1    sched appt  . pantoprazole (PROTONIX) 40 MG tablet    Sig: Take 1 tablet (40 mg total) by mouth daily.    Dispense:  90 tablet    Refill:  3  . loratadine (CLARITIN) 10 MG tablet    Sig: Take 1 tablet (10 mg total) by mouth daily.    Dispense:  90 tablet    Refill:  3  . fluticasone (FLONASE) 50 MCG/ACT nasal spray    Sig: SPRAY ONCE IN EACH NOSTRIL DAILY    Dispense:  16 g    Refill:  11      Dr. Deanna Jones [facility]  08/22/17

## 2017-08-22 NOTE — Assessment & Plan Note (Signed)
Patient notes previous previous hyperglycemia episodes. Will check A1C.

## 2017-08-22 NOTE — Assessment & Plan Note (Signed)
With history hyperlipidemia/ and prediabetes will check lipid panel.

## 2017-08-23 LAB — RENAL FUNCTION PANEL
Albumin: 4.8 g/dL (ref 3.5–5.5)
BUN/Creatinine Ratio: 17 (ref 9–20)
BUN: 18 mg/dL (ref 6–24)
CALCIUM: 9.4 mg/dL (ref 8.7–10.2)
CHLORIDE: 105 mmol/L (ref 96–106)
CO2: 21 mmol/L (ref 20–29)
CREATININE: 1.06 mg/dL (ref 0.76–1.27)
GFR calc Af Amer: 92 mL/min/{1.73_m2} (ref >59)
GFR calc non Af Amer: 79 mL/min/{1.73_m2} (ref >59)
Glucose: 76 mg/dL (ref 65–99)
PHOSPHORUS: 2.9 mg/dL (ref 2.5–4.5)
Potassium: 4.3 mmol/L (ref 3.5–5.2)
Sodium: 145 mmol/L — ABNORMAL HIGH (ref 134–144)

## 2017-08-23 LAB — LIPID PANEL
Chol/HDL Ratio: 4.6 ratio (ref 0.0–5.0)
Cholesterol, Total: 250 mg/dL — ABNORMAL HIGH (ref 100–199)
HDL: 54 mg/dL (ref >39)
LDL Calculated: 171 mg/dL — ABNORMAL HIGH (ref 0–99)
Triglycerides: 123 mg/dL (ref 0–149)
VLDL Cholesterol Cal: 25 mg/dL (ref 5–40)

## 2017-08-23 LAB — HIV ANTIBODY (ROUTINE TESTING W REFLEX): HIV SCREEN 4TH GENERATION: NONREACTIVE

## 2017-08-23 LAB — HEPATITIS C ANTIBODY: Hep C Virus Ab: <0.1 s/co ratio (ref 0.0–0.9)

## 2017-08-23 LAB — HEMOGLOBIN A1C
ESTIMATED AVERAGE GLUCOSE: 117 mg/dL
HEMOGLOBIN A1C: 5.7 % — AB (ref 4.8–5.6)

## 2017-09-09 ENCOUNTER — Encounter: Payer: Self-pay | Admitting: *Deleted

## 2017-10-07 ENCOUNTER — Other Ambulatory Visit: Payer: Self-pay | Admitting: Family Medicine

## 2017-12-12 ENCOUNTER — Other Ambulatory Visit: Payer: Self-pay | Admitting: Family Medicine

## 2018-02-03 ENCOUNTER — Ambulatory Visit (INDEPENDENT_AMBULATORY_CARE_PROVIDER_SITE_OTHER): Payer: 59

## 2018-02-03 DIAGNOSIS — Z23 Encounter for immunization: Secondary | ICD-10-CM

## 2018-02-06 ENCOUNTER — Ambulatory Visit (INDEPENDENT_AMBULATORY_CARE_PROVIDER_SITE_OTHER): Payer: 59 | Admitting: Family Medicine

## 2018-02-06 ENCOUNTER — Encounter: Payer: Self-pay | Admitting: Family Medicine

## 2018-02-06 VITALS — BP 128/82 | HR 102 | Resp 16 | Ht 71.0 in | Wt 245.0 lb

## 2018-02-06 DIAGNOSIS — N529 Male erectile dysfunction, unspecified: Secondary | ICD-10-CM

## 2018-02-06 DIAGNOSIS — J301 Allergic rhinitis due to pollen: Secondary | ICD-10-CM | POA: Diagnosis not present

## 2018-02-06 MED ORDER — PSEUDOEPHEDRINE HCL ER 120 MG PO TB12: ORAL_TABLET | ORAL | 2 refills | Status: DC

## 2018-02-06 MED ORDER — FLUTICASONE PROPIONATE 50 MCG/ACT NA SUSP: NASAL | 11 refills | Status: DC

## 2018-02-06 MED ORDER — OLOPATADINE HCL 0.6 % NA SOLN: 2.0000 | Freq: Two times a day (BID) | NASAL | 1 refills | Status: DC

## 2018-02-06 MED ORDER — TADALAFIL 20 MG PO TABS: 10.0000 mg | ORAL_TABLET | ORAL | 11 refills | Status: DC | PRN

## 2018-02-06 MED ORDER — LORATADINE 10 MG PO TABS: 10.0000 mg | ORAL_TABLET | Freq: Every day | ORAL | 3 refills | Status: DC

## 2018-02-06 NOTE — Patient Instructions (Signed)

## 2018-02-06 NOTE — Progress Notes (Signed)
cialis   Date:  02/06/2018   Name:  Tyler Glover   DOB:  25-Jan-1961   MRN:  [id number]   Chief Complaint: Allergies  URI   This is a chronic problem. The current episode started more than 1 year ago. The problem has been waxing and waning. There has been no fever. Associated symptoms include congestion, rhinorrhea and sneezing. Pertinent negatives include no abdominal pain, chest pain, coughing, diarrhea, dysuria, ear pain, headaches, joint pain, joint swelling, nausea, neck pain, plugged ear sensation, rash, sinus pain, sore throat, swollen glands, vomiting or wheezing. He has tried antihistamine and decongestant (nasal steroid) for the symptoms. The treatment provided mild relief.    Review of Systems  Constitutional: Negative for chills and fever.  HENT: Positive for congestion, rhinorrhea and sneezing. Negative for drooling, ear discharge, ear pain, sinus pain and sore throat.   Respiratory: Negative for cough, shortness of breath and wheezing.   Cardiovascular: Negative for chest pain, palpitations and leg swelling.  Gastrointestinal: Negative for abdominal pain, blood in stool, constipation, diarrhea, nausea and vomiting.  Endocrine: Negative for polydipsia.  Genitourinary: Negative for dysuria, frequency, hematuria and urgency.  Musculoskeletal: Negative for back pain, joint pain, myalgias and neck pain.  Skin: Negative for rash.  Allergic/Immunologic: Negative for environmental allergies.  Neurological: Negative for dizziness and headaches.  Hematological: Does not bruise/bleed easily.  Psychiatric/Behavioral: Negative for suicidal ideas. The patient is not nervous/anxious.     Patient Active Problem List   Diagnosis Date Noted  . Pure hypercholesterolemia 01/26/2016  . Right lateral epicondylitis 07/07/2015  . Vitamin D deficiency 12/24/2014  . Prediabetes 12/24/2014  . Allergic rhinitis, seasonal 06/03/2014  . Acid reflux 06/03/2014  . Essential (primary)  hypertension 06/03/2014  . Family history of diabetes mellitus 06/03/2014  . Family history of cardiac disorder 06/03/2014  . Obesity (BMI 30.0-34.9) 06/03/2014    Allergies  Allergen Reactions  . Penicillins     Told as child not to do injectable, but has taken Amoxicillin since Unknown    Past Surgical History:  Procedure Laterality Date  . SHOULDER SURGERY      Social History   Tobacco Use  . Smoking status: Never Smoker  . Smokeless tobacco: Never Used  Substance Use Topics  . Alcohol use: Yes    Alcohol/week: 0.0 standard drinks  . Drug use: No     Medication list has been reviewed and updated.  Current Meds  Medication Sig  . b complex vitamins tablet Take 1 tablet by mouth daily.  . Cholecalciferol (VITAMIN D) 2000 units CAPS Take 1 capsule (2,000 Units total) by mouth daily.  . fluticasone (FLONASE) 50 MCG/ACT nasal spray SPRAY ONCE IN EACH NOSTRIL DAILY  . lisinopril (PRINIVIL,ZESTRIL) 40 MG tablet Take 1 tablet (40 mg total) by mouth daily.  Marland Kitchen loratadine (CLARITIN) 10 MG tablet Take 1 tablet (10 mg total) by mouth daily.  . naproxen (NAPROSYN) 500 MG tablet Take 1 tablet (500 mg total) by mouth 2 (two) times daily with a meal. (Patient taking differently: Take 500 mg by mouth 2 (two) times daily with a meal. otc)  . pantoprazole (PROTONIX) 40 MG tablet Take 1 tablet (40 mg total) by mouth daily.  . WAL-PHED 12 HOUR 120 MG 12 hr tablet TAKE 1 TABLET BY MOUTH EVERY 12 HOURS AS NEEDED FOR CONGESTION    PHQ 2/9 Scores 08/22/2017 07/07/2015  PHQ - 2 Score 0 0    Physical Exam Vitals signs and nursing note reviewed.  HENT:  Head: Normocephalic.     Right Ear: Tympanic membrane and external ear normal.     Left Ear: Tympanic membrane and external ear normal.     Nose: Nose normal. No nasal tenderness or mucosal edema.     Mouth/Throat:     Mouth: Mucous membranes are moist.     Pharynx: Oropharynx is clear. No pharyngeal swelling.  Eyes:     General: No  scleral icterus.       Right eye: No discharge.        Left eye: No discharge.     Conjunctiva/sclera: Conjunctivae normal.     Pupils: Pupils are equal, round, and reactive to light.  Neck:     Musculoskeletal: Normal range of motion and neck supple.     Thyroid: No thyromegaly.     Vascular: No JVD.     Trachea: No tracheal deviation.  Cardiovascular:     Rate and Rhythm: Normal rate and regular rhythm.     Heart sounds: Normal heart sounds. No murmur. No friction rub. No gallop.   Pulmonary:     Effort: No respiratory distress.     Breath sounds: Normal breath sounds. No decreased breath sounds, wheezing, rhonchi or rales.  Abdominal:     General: Bowel sounds are normal.     Palpations: Abdomen is soft. There is no mass.     Tenderness: There is no abdominal tenderness. There is no guarding or rebound.  Musculoskeletal: Normal range of motion.        General: No tenderness.  Lymphadenopathy:     Cervical: No cervical adenopathy.  Skin:    General: Skin is warm.     Findings: No rash.  Neurological:     Mental Status: He is alert and oriented to person, place, and time.     Cranial Nerves: No cranial nerve deficit.     Deep Tendon Reflexes: Reflexes are normal and symmetric.     BP 128/82   Pulse (!) 102   Resp 16   Ht 5\' 11"  (1.803 m)   Wt 245 lb (111.1 kg)   BMI 34.17 kg/m   Assessment and Plan: 1. Seasonal allergic rhinitis due to pollen Patient complaint of persistence of allergy symptoms despite Claritin and Sudafed and Flonase.  Primary symptom is rhinorrhea and nasal stuffiness therefore we will try some Pataday 1 to 2 sprays in each nostril twice a day. - loratadine (CLARITIN) 10 MG tablet; Take 1 tablet (10 mg total) by mouth daily.  Dispense: 90 tablet; Refill: 3 - fluticasone (FLONASE) 50 MCG/ACT nasal spray; SPRAY ONCE IN EACH NOSTRIL DAILY  Dispense: 16 g; Refill: 11 - pseudoephedrine (WAL-PHED 12 HOUR) 120 MG 12 hr tablet; TAKE 1 TABLET BY MOUTH EVERY  12 HOURS AS NEEDED FOR CONGESTION  Dispense: 20 tablet; Refill: 2 - Olopatadine HCl 0.6 % SOLN; Place 2 sprays into the nose 2 (two) times daily.  Dispense: 1 Bottle; Refill: 1  2. Vasculogenic erectile dysfunction, unspecified vasculogenic erectile dysfunction type Patient also is concerned about some erectile dysfunction symptoms we will check his testosterone free and total and him a trial of Cialis see if this should help. - Testosterone,Free and Total - tadalafil (ADCIRCA/CIALIS) 20 MG tablet; Take 0.5-1 tablets (10-20 mg total) by mouth every other day as needed for erectile dysfunction.  Dispense: 10 tablet; Refill: 11

## 2018-02-07 LAB — TESTOSTERONE,FREE AND TOTAL
Testosterone, Free: 10 pg/mL (ref 7.2–24.0)
Testosterone: 252 ng/dL — ABNORMAL LOW (ref 264–916)

## 2018-02-17 ENCOUNTER — Other Ambulatory Visit: Payer: Self-pay

## 2018-02-17 DIAGNOSIS — R7989 Other specified abnormal findings of blood chemistry: Secondary | ICD-10-CM

## 2018-02-17 MED ORDER — TESTOSTERONE 20.25 MG/ACT (1.62%) TD GEL: 1.0000 | Freq: Every day | TRANSDERMAL | 0 refills | Status: DC

## 2018-03-02 ENCOUNTER — Other Ambulatory Visit: Payer: Self-pay | Admitting: Family Medicine

## 2018-03-02 DIAGNOSIS — I10 Essential (primary) hypertension: Secondary | ICD-10-CM

## 2018-03-03 ENCOUNTER — Ambulatory Visit (INDEPENDENT_AMBULATORY_CARE_PROVIDER_SITE_OTHER): Payer: 59 | Admitting: Family Medicine

## 2018-03-03 ENCOUNTER — Ambulatory Visit
Admission: RE | Admit: 2018-03-03 | Discharge: 2018-03-03 | Disposition: A | Discharge status: Home / Self Care | Payer: 59 | Source: Ambulatory Visit | Attending: Family Medicine | Admitting: Family Medicine

## 2018-03-03 ENCOUNTER — Encounter: Payer: Self-pay | Admitting: Family Medicine

## 2018-03-03 ENCOUNTER — Ambulatory Visit
Admission: RE | Admit: 2018-03-03 | Discharge: 2018-03-03 | Disposition: A | Discharge status: Home / Self Care | Payer: 59 | Attending: Family Medicine | Admitting: Family Medicine

## 2018-03-03 VITALS — BP 130/80 | HR 68 | Ht 71.0 in | Wt 244.0 lb

## 2018-03-03 DIAGNOSIS — M7742 Metatarsalgia, left foot: Secondary | ICD-10-CM | POA: Insufficient documentation

## 2018-03-03 MED ORDER — MELOXICAM 15 MG PO TABS: 15.0000 mg | ORAL_TABLET | Freq: Every day | ORAL | 1 refills | Status: DC

## 2018-03-03 NOTE — Patient Instructions (Signed)
Turf Toe    Turf toe is an injury that affects the joint at the base of the big toe. It occurs when the toe is bent upward by force and extended beyond its normal limits (hyperextension). The joint of the big toe is surrounded by tissues (ligaments and tendons) that help to keep it in place. If any of these tissues are damaged, turf toe may result.  Turf toe is a common sports injury. It can be mild if the tissue was stretched. It may be more severe if the tissue was partially or completely torn. Early treatment usually results in good recovery. In some cases, a person may continue to have some pain, joint stiffness, and reduced ability to push off from the affected foot.  What are the causes?  This injury is caused by extreme upward bending of the big toe joint. It can occur when:  · Your toe is pressed flat to the ground and your heel is raised while you push off forcefully with the front of your foot. For example, this could happen when you begin a sprint.  · You push off on the ball of your foot repeatedly while running or jumping, especially on hard surfaces such as a basketball court.  · You jam your toe from a force pushing into the toe.  What increases the risk?  This injury is more likely to occur in people:  · Who wear flexible shoes that do not offer good support while running or jumping.  · Who participate in activities or sports that involve running and jumping on turf or hard surfaces, such as:  ? Soccer.  ? Football.  ? Basketball.  ? Volleyball.  ? Gymnastics.  ? Dancing.  ? Wrestling.  What are the signs or symptoms?  Symptoms of this condition include:  · Pain at the base of the toe.  · Swelling at the base of the toe.  · Stiffness.  · Limited movement of the toe.  · Bruising.  If turf toe is the result of a direct injury, symptoms may appear suddenly. If the condition is due to repetitive movements, such as running and jumping, the symptoms may develop gradually and worsen over time.  How is this  diagnosed?  This condition may be diagnosed based on your symptoms, your medical history, and a physical exam of your foot. During the exam, the health care provider will check the range of motion of your toe by moving it up and down and from side to side. You may also have tests to confirm the diagnosis, such as:  · X-rays to rule out bone problems, such as a fracture, a chip, or bones that are out of alignment.  · MRI to view the soft tissue and cartilage in your toe. This will help to determine how severe your injury is.  How is this treated?  Treatment for this condition depends on the severity of the injury. Treatment may include:  · Rest, ice, compression, and elevation. This is often called the RICE strategy. It may be recommended if the injury is mild. Your health care provider may also restrict movement of your toe by taping it to the smaller toes.  · Wearing a walking boot or a cast. For more serious turf toe, you may need to wear a walking boot for about one week. This will keep your toe from moving (immobilization). If you have severe turf toe, you may need to wear a cast or a walking boot for   several weeks.  · Over-the-counter medicines to relieve pain.  · Physical therapy. Stretching and strengthening exercises can help to reduce or prevent joint stiffness in your toe.  · Surgery. In rare cases, surgery may be needed for a severe injury if pain does not go away.  Follow these instructions at home:  Managing pain, stiffness, and swelling  · If directed, apply ice to the injured area:  ? Put ice in a plastic bag.  ? Place a towel between your skin and the bag.  ? Leave the ice on for 20 minutes, 2-3 times per day.  · Move your toes often to avoid stiffness and to lessen swelling.  · Wear an elastic compression bandage to help prevent or lessen swelling.  · Raise (elevate) your foot above the level of your heart while you are sitting or lying down.  · If you have a walking boot, wear it as told by your  health care provider.  · Do not use the injured foot to support (bear) your body weight until your health care provider says that you can. Use crutches as told by your health care provider.  Medicines  · Take over-the-counter and prescription medicines only as told by your health care provider.  · Do not drive or operate heavy machinery while taking prescription pain medicine.  If you have a cast:  · Do not put pressure on any part of the cast until it is fully hardened. This may take several hours.  · Do not stick anything inside the cast to scratch your skin. Doing that increases your risk of infection.  · Check the skin around the cast every day. Report any concerns to your health care provider. You may put lotion on dry skin around the edges of the cast. Do not apply lotion to the skin underneath the cast.  · Do not let your cast get wet if it is not waterproof.  · Keep the cast clean.  General instructions  · If your cast is not waterproof, cover it with a watertight plastic bag when you take a bath or a shower.  · Return to your normal activities as told by your health care provider. Ask your health care provider what activities are safe for you.  · Switch to less-flexible, more supportive footwear as told by your health care provider. Rigid shoe inserts (orthotics) can also reduce stress on your toes and improve stability.  · Keep all follow-up visits as told by your health care provider. This is important.  Contact a health care provider if:  · You have new bruising or swelling in your toe.  · The pain in your toe gets worse.  · Your pain medicine is not helping.  Get help right away if:  · Your cast or walking boot becomes loose or damaged.  · Your pain becomes severe.  · Your toe becomes numb or changes color.  · Your toe joint feels unstable or is unable to bear any weight.  This information is not intended to replace advice given to you by your health care provider. Make sure you discuss any questions  you have with your health care provider.  Document Released: 07/21/2001 Document Revised: 10/02/2015 Document Reviewed: 08/11/2014  Elsevier Interactive Patient Education © 2019 [employer]

## 2018-03-03 NOTE — Progress Notes (Signed)
Date:  03/03/2018   Name:  Tyler Glover   DOB:  02/24/1960   MRN:  [id number]   Chief Complaint: Foot Pain (pain on bottom of L) foot- in the "pad"of the foot. Hurts when he steps down/ applies pressure. Is better when wearing tennis shoes. Started about 3 weeks ago.)  Foot Pain  This is a new problem. The current episode started more than 1 month ago. The problem occurs intermittently. The problem has been waxing and waning. Pertinent negatives include no abdominal pain, anorexia, arthralgias, change in bowel habit, chest pain, chills, congestion, coughing, diaphoresis, fatigue, fever, headaches, joint swelling, myalgias, nausea, neck pain, numbness, rash, sore throat, swollen glands, urinary symptoms, vertigo, visual change, vomiting or weakness. The symptoms are aggravated by walking (on feet). He has tried NSAIDs (tennis shoes) for the symptoms. The treatment provided mild relief.    Review of Systems  Constitutional: Negative for chills, diaphoresis, fatigue and fever.  HENT: Negative for congestion, drooling, ear discharge, ear pain and sore throat.   Respiratory: Negative for cough, shortness of breath and wheezing.   Cardiovascular: Negative for chest pain, palpitations and leg swelling.  Gastrointestinal: Negative for abdominal pain, anorexia, blood in stool, change in bowel habit, constipation, diarrhea, nausea and vomiting.  Endocrine: Negative for polydipsia.  Genitourinary: Negative for dysuria, frequency, hematuria and urgency.  Musculoskeletal: Negative for arthralgias, back pain, joint swelling, myalgias and neck pain.  Skin: Negative for rash.  Allergic/Immunologic: Negative for environmental allergies.  Neurological: Negative for dizziness, vertigo, weakness, numbness and headaches.  Hematological: Does not bruise/bleed easily.  Psychiatric/Behavioral: Negative for suicidal ideas. The patient is not nervous/anxious.     Patient Active Problem List   Diagnosis  Date Noted  . Pure hypercholesterolemia 01/26/2016  . Right lateral epicondylitis 07/07/2015  . Vitamin D deficiency 12/24/2014  . Prediabetes 12/24/2014  . Allergic rhinitis, seasonal 06/03/2014  . Acid reflux 06/03/2014  . Essential (primary) hypertension 06/03/2014  . Family history of diabetes mellitus 06/03/2014  . Family history of cardiac disorder 06/03/2014  . Obesity (BMI 30.0-34.9) 06/03/2014    Allergies  Allergen Reactions  . Penicillins     Told as child not to do injectable, but has taken Amoxicillin since Unknown    Past Surgical History:  Procedure Laterality Date  . SHOULDER SURGERY      Social History   Tobacco Use  . Smoking status: Never Smoker  . Smokeless tobacco: Never Used  Substance Use Topics  . Alcohol use: Yes    Alcohol/week: 0.0 standard drinks  . Drug use: No     Medication list has been reviewed and updated.  Current Meds  Medication Sig  . b complex vitamins tablet Take 1 tablet by mouth daily.  . Cholecalciferol (VITAMIN D) 2000 units CAPS Take 1 capsule (2,000 Units total) by mouth daily.  . fluticasone (FLONASE) 50 MCG/ACT nasal spray SPRAY ONCE IN EACH NOSTRIL DAILY  . lisinopril (PRINIVIL,ZESTRIL) 40 MG tablet TAKE 1 TABLET BY MOUTH DAILY  . loratadine (CLARITIN) 10 MG tablet Take 1 tablet (10 mg total) by mouth daily.  . Olopatadine HCl 0.6 % SOLN Place 2 sprays into the nose 2 (two) times daily.  . pantoprazole (PROTONIX) 40 MG tablet Take 1 tablet (40 mg total) by mouth daily.  . pseudoephedrine (WAL-PHED 12 HOUR) 120 MG 12 hr tablet TAKE 1 TABLET BY MOUTH EVERY 12 HOURS AS NEEDED FOR CONGESTION  . tadalafil (ADCIRCA/CIALIS) 20 MG tablet Take 0.5-1 tablets (10-20 mg total)  by mouth every other day as needed for erectile dysfunction.  . Testosterone (ANDROGEL PUMP) 20.25 MG/ACT (1.62%) GEL Place 1 Pump onto the skin daily. Apply 1 pump to the forearm daily    PHQ 2/9 Scores 08/22/2017 07/07/2015  PHQ - 2 Score 0 0     Physical Exam Vitals signs and nursing note reviewed.  Constitutional:      Appearance: He is normal weight.  HENT:     Head: Normocephalic.     Right Ear: Tympanic membrane, ear canal and external ear normal.     Left Ear: Tympanic membrane, ear canal and external ear normal.     Nose: Nose normal.  Eyes:     General: No scleral icterus.       Right eye: No discharge.        Left eye: No discharge.     Conjunctiva/sclera: Conjunctivae normal.     Pupils: Pupils are equal, round, and reactive to light.  Neck:     Musculoskeletal: Normal range of motion and neck supple.     Thyroid: No thyromegaly.     Vascular: No JVD.     Trachea: No tracheal deviation.  Cardiovascular:     Rate and Rhythm: Normal rate and regular rhythm.     Pulses: Normal pulses.          Dorsalis pedis pulses are 2+ on the left side.       Posterior tibial pulses are 2+ on the left side.     Heart sounds: Normal heart sounds. No murmur. No friction rub. No gallop.   Pulmonary:     Effort: Pulmonary effort is normal. No respiratory distress.     Breath sounds: Normal breath sounds. No wheezing or rales.  Abdominal:     General: Bowel sounds are normal.     Palpations: Abdomen is soft. There is no mass.     Tenderness: There is no abdominal tenderness. There is no guarding or rebound.  Musculoskeletal: Normal range of motion.     Left foot: Normal range of motion and normal capillary refill. Tenderness and bony tenderness present. No swelling, crepitus, deformity or laceration.       Feet:  Lymphadenopathy:     Cervical: No cervical adenopathy.  Skin:    General: Skin is warm.     Coloration: Skin is not jaundiced or pale.     Findings: No bruising, erythema, lesion or rash.  Neurological:     Mental Status: He is alert and oriented to person, place, and time.     Cranial Nerves: No cranial nerve deficit.     Deep Tendon Reflexes: Reflexes are normal and symmetric.     BP 130/80   Pulse 68    Ht 5\' 11"  (1.803 m)   Wt 244 lb (110.7 kg)   BMI 34.03 kg/m    Assessment and Plan: 1. Metatarsalgia of left foot Acute.  Persistent.  Is been going on for 6 weeks we will do an x-ray to rule out a stress fracture.  In the meantime we will start on meloxicam 15 mg once a day.  And information given. - DG Foot Complete Left; Future - meloxicam (MOBIC) 15 MG tablet; Take 1 tablet (15 mg total) by mouth daily.  Dispense: 30 tablet; Refill: 1

## 2018-03-21 ENCOUNTER — Telehealth: Payer: Self-pay

## 2018-03-21 ENCOUNTER — Other Ambulatory Visit: Payer: Self-pay

## 2018-03-21 DIAGNOSIS — M7711 Lateral epicondylitis, right elbow: Secondary | ICD-10-CM

## 2018-03-21 MED ORDER — NAPROXEN 500 MG PO TABS: 500.0000 mg | ORAL_TABLET | Freq: Two times a day (BID) | ORAL | 1 refills | Status: DC

## 2018-03-21 NOTE — Progress Notes (Unsigned)
Pt wanted to have naproxen filled instead of meloxicam. Was instructed not to take them together and we will send in naproxen to [facility]

## 2018-03-21 NOTE — Telephone Encounter (Signed)
Seen 03/03/18 and now asking for Hydrocodone or Naproxen for pain that he claims he has these but they are expired. I see Meloxicam was given? Please call him.

## 2018-03-21 NOTE — Telephone Encounter (Signed)
Sent in naproxen for shoulder pain/ was instructed to stop Meloxicam and not take with Naproxen. Sent Naproxen to [facility]. Pt notified.

## 2018-03-25 ENCOUNTER — Other Ambulatory Visit: Payer: Self-pay

## 2018-03-25 DIAGNOSIS — M25519 Pain in unspecified shoulder: Secondary | ICD-10-CM

## 2018-03-25 NOTE — Progress Notes (Unsigned)
Put in ref to ortho

## 2018-04-23 ENCOUNTER — Other Ambulatory Visit: Payer: Self-pay | Admitting: Orthopedic Surgery

## 2018-04-23 DIAGNOSIS — M25311 Other instability, right shoulder: Secondary | ICD-10-CM

## 2018-04-23 DIAGNOSIS — G8929 Other chronic pain: Secondary | ICD-10-CM

## 2018-04-23 DIAGNOSIS — M25511 Pain in right shoulder: Principal | ICD-10-CM

## 2018-04-23 DIAGNOSIS — M7521 Bicipital tendinitis, right shoulder: Secondary | ICD-10-CM

## 2018-05-01 ENCOUNTER — Ambulatory Visit
Admission: RE | Admit: 2018-05-01 | Discharge: 2018-05-01 | Disposition: A | Discharge status: Home / Self Care | Payer: 59 | Source: Ambulatory Visit | Attending: Orthopedic Surgery | Admitting: Orthopedic Surgery

## 2018-05-01 ENCOUNTER — Other Ambulatory Visit: Payer: Self-pay

## 2018-05-01 DIAGNOSIS — G8929 Other chronic pain: Secondary | ICD-10-CM | POA: Diagnosis present

## 2018-05-01 DIAGNOSIS — M7521 Bicipital tendinitis, right shoulder: Secondary | ICD-10-CM

## 2018-05-01 DIAGNOSIS — M25511 Pain in right shoulder: Secondary | ICD-10-CM | POA: Insufficient documentation

## 2018-05-01 DIAGNOSIS — M25311 Other instability, right shoulder: Secondary | ICD-10-CM | POA: Diagnosis present

## 2018-06-03 ENCOUNTER — Ambulatory Visit (INDEPENDENT_AMBULATORY_CARE_PROVIDER_SITE_OTHER): Payer: 59 | Admitting: Family Medicine

## 2018-06-03 ENCOUNTER — Encounter: Payer: Self-pay | Admitting: Family Medicine

## 2018-06-03 VITALS — BP 108/74 | HR 94 | Temp 98.4°F | Ht 71.0 in | Wt 229.0 lb

## 2018-06-03 DIAGNOSIS — M778 Other enthesopathies, not elsewhere classified: Secondary | ICD-10-CM

## 2018-06-03 DIAGNOSIS — K219 Gastro-esophageal reflux disease without esophagitis: Secondary | ICD-10-CM

## 2018-06-03 DIAGNOSIS — R7989 Other specified abnormal findings of blood chemistry: Secondary | ICD-10-CM | POA: Diagnosis not present

## 2018-06-03 DIAGNOSIS — I10 Essential (primary) hypertension: Secondary | ICD-10-CM | POA: Diagnosis not present

## 2018-06-03 DIAGNOSIS — N529 Male erectile dysfunction, unspecified: Secondary | ICD-10-CM

## 2018-06-03 DIAGNOSIS — J301 Allergic rhinitis due to pollen: Secondary | ICD-10-CM

## 2018-06-03 DIAGNOSIS — E7800 Pure hypercholesterolemia, unspecified: Secondary | ICD-10-CM

## 2018-06-03 DIAGNOSIS — M779 Enthesopathy, unspecified: Secondary | ICD-10-CM

## 2018-06-03 MED ORDER — FLUTICASONE PROPIONATE 50 MCG/ACT NA SUSP: NASAL | 11 refills | Status: DC

## 2018-06-03 MED ORDER — TESTOSTERONE 20.25 MG/ACT (1.62%) TD GEL: 1.0000 | Freq: Every day | TRANSDERMAL | 5 refills | Status: DC

## 2018-06-03 MED ORDER — PANTOPRAZOLE SODIUM 40 MG PO TBEC: 40.0000 mg | DELAYED_RELEASE_TABLET | Freq: Every day | ORAL | 3 refills | Status: DC

## 2018-06-03 MED ORDER — LISINOPRIL 40 MG PO TABS: 40.0000 mg | ORAL_TABLET | Freq: Every day | ORAL | 1 refills | Status: DC

## 2018-06-03 MED ORDER — PSEUDOEPHEDRINE HCL ER 120 MG PO TB12: ORAL_TABLET | ORAL | 5 refills | Status: DC

## 2018-06-03 MED ORDER — TADALAFIL 20 MG PO TABS: 10.0000 mg | ORAL_TABLET | ORAL | 11 refills | Status: DC | PRN

## 2018-06-03 NOTE — Progress Notes (Signed)
Date:  06/03/2018   Name:  Tyler Glover   DOB:  1960/11/21   MRN:  [id number]   Chief Complaint: Allergic Rhinitis ; Gastroesophageal Reflux; Hypertension; Erectile Dysfunction; and hypotestosteronism  Gastroesophageal Reflux  He reports no abdominal pain, no belching, no chest pain, no choking, no coughing, no dysphagia, no early satiety, no globus sensation, no heartburn, no hoarse voice, no nausea, no sore throat, no stridor, no tooth decay, no water brash or no wheezing. This is a chronic problem. The current episode started more than 1 year ago. The problem occurs occasionally. The problem has been unchanged. The symptoms are aggravated by certain foods. Pertinent negatives include no anemia, fatigue, melena, muscle weakness, orthopnea or weight loss. There are no known risk factors. He has tried a PPI for the symptoms. The treatment provided moderate relief.  Hypertension  This is a chronic problem. The current episode started more than 1 year ago. The problem has been gradually improving since onset. The problem is controlled. Pertinent negatives include no anxiety, blurred vision, chest pain, headaches, malaise/fatigue, neck pain, orthopnea, palpitations, peripheral edema, PND, shortness of breath or sweats. There are no associated agents to hypertension. Past treatments include ACE inhibitors. The current treatment provides moderate improvement. There are no compliance problems.  There is no history of angina, kidney disease, CAD/MI, CVA, heart failure, left ventricular hypertrophy, PVD or retinopathy. There is no history of chronic renal disease, a hypertension causing med or renovascular disease.  Erectile Dysfunction  This is a chronic problem. The current episode started more than 1 year ago. The problem has been gradually worsening since onset. He reports no anxiety, decreased libido or performance anxiety. Irritative symptoms do not include frequency, nocturia or urgency.  Obstructive symptoms do not include dribbling, incomplete emptying, an intermittent stream, a slower stream, straining or a weak stream. Pertinent negatives include no chills, dysuria or hematuria. Past treatments include tadalafil (testosterone/androgel). The treatment provided mild relief. He has had no adverse reactions caused by medications. There are no known risk factors.  Hand Pain   The incident occurred at home. There was no injury mechanism. Pain location: right thumb. The quality of the pain is described as aching. Pertinent negatives include no chest pain.    Review of Systems  Constitutional: Negative for chills, fatigue, fever, malaise/fatigue and weight loss.  HENT: Negative for drooling, ear discharge, ear pain, hoarse voice and sore throat.   Eyes: Negative for blurred vision and redness.  Respiratory: Negative for cough, choking, shortness of breath and wheezing.   Cardiovascular: Negative for chest pain, palpitations, orthopnea, leg swelling and PND.  Gastrointestinal: Negative for abdominal pain, blood in stool, constipation, diarrhea, dysphagia, heartburn, melena and nausea.  Endocrine: Negative for polydipsia.  Genitourinary: Negative for decreased libido, dysuria, frequency, hematuria, incomplete emptying, nocturia and urgency.  Musculoskeletal: Negative for back pain, myalgias, muscle weakness and neck pain.  Skin: Negative for rash.  Allergic/Immunologic: Negative for environmental allergies.  Neurological: Negative for dizziness and headaches.  Hematological: Does not bruise/bleed easily.  Psychiatric/Behavioral: Negative for suicidal ideas. The patient is not nervous/anxious.     Patient Active Problem List   Diagnosis Date Noted  . Pure hypercholesterolemia 01/26/2016  . Right lateral epicondylitis 07/07/2015  . Vitamin D deficiency 12/24/2014  . Prediabetes 12/24/2014  . Allergic rhinitis, seasonal 06/03/2014  . Acid reflux 06/03/2014  . Essential (primary)  hypertension 06/03/2014  . Family history of diabetes mellitus 06/03/2014  . Family history of cardiac disorder 06/03/2014  .  Obesity (BMI 30.0-34.9) 06/03/2014    Allergies  Allergen Reactions  . Penicillins     Told as child not to do injectable, but has taken Amoxicillin since Unknown    Past Surgical History:  Procedure Laterality Date  . SHOULDER SURGERY      Social History   Tobacco Use  . Smoking status: Never Smoker  . Smokeless tobacco: Never Used  Substance Use Topics  . Alcohol use: Yes    Alcohol/week: 0.0 standard drinks  . Drug use: No     Medication list has been reviewed and updated.  Current Meds  Medication Sig  . b complex vitamins tablet Take 1 tablet by mouth daily.  . Cholecalciferol (VITAMIN D) 2000 units CAPS Take 1 capsule (2,000 Units total) by mouth daily.  . fluticasone (FLONASE) 50 MCG/ACT nasal spray SPRAY ONCE IN EACH NOSTRIL DAILY  . lisinopril (PRINIVIL,ZESTRIL) 40 MG tablet TAKE 1 TABLET BY MOUTH DAILY  . loratadine (CLARITIN) 10 MG tablet Take 1 tablet (10 mg total) by mouth daily.  . meloxicam (MOBIC) 15 MG tablet Take 1 tablet by mouth daily. Reche [place]  . naproxen (NAPROSYN) 500 MG tablet Take 1 tablet (500 mg total) by mouth 2 (two) times daily with a meal.  . pantoprazole (PROTONIX) 40 MG tablet Take 1 tablet (40 mg total) by mouth daily.  . pseudoephedrine (WAL-PHED 12 HOUR) 120 MG 12 hr tablet TAKE 1 TABLET BY MOUTH EVERY 12 HOURS AS NEEDED FOR CONGESTION  . tadalafil (ADCIRCA/CIALIS) 20 MG tablet Take 0.5-1 tablets (10-20 mg total) by mouth every other day as needed for erectile dysfunction.  . Testosterone (ANDROGEL PUMP) 20.25 MG/ACT (1.62%) GEL Place 1 Pump onto the skin daily. Apply 1 pump to the forearm daily  . traMADol (ULTRAM) 50 MG tablet Take by mouth. Reche [place]    [facility] 2/9 Scores 06/03/2018 08/22/2017 07/07/2015  PHQ - 2 Score 0 0 0  PHQ- 9 Score 0 - -    BP Readings from Last 3 Encounters:  06/03/18 108/74   03/03/18 130/80  02/06/18 128/82    Physical Exam Vitals signs and nursing note reviewed.  HENT:     Head: Normocephalic.     Right Ear: Tympanic membrane, ear canal and external ear normal.     Left Ear: Tympanic membrane, ear canal and external ear normal.     Nose: Nose normal. No congestion or rhinorrhea.     Mouth/Throat:     Pharynx: No oropharyngeal exudate or posterior oropharyngeal erythema.  Eyes:     General: No scleral icterus.       Right eye: No discharge.        Left eye: No discharge.     Conjunctiva/sclera: Conjunctivae normal.     Pupils: Pupils are equal, round, and reactive to light.  Neck:     Musculoskeletal: Normal range of motion and neck supple. No neck rigidity or muscular tenderness.     Thyroid: No thyromegaly.     Vascular: No carotid bruit or JVD.     Trachea: No tracheal deviation.  Cardiovascular:     Rate and Rhythm: Normal rate and regular rhythm.     Pulses: Normal pulses.     Heart sounds: Normal heart sounds. No murmur. No friction rub. No gallop.   Pulmonary:     Effort: Pulmonary effort is normal. No respiratory distress.     Breath sounds: Normal breath sounds. No stridor. No wheezing, rhonchi or rales.  Chest:  Chest wall: No tenderness.  Abdominal:     General: Bowel sounds are normal.     Palpations: Abdomen is soft. There is no mass.     Tenderness: There is no abdominal tenderness. There is no guarding or rebound.  Musculoskeletal: Normal range of motion.        General: No tenderness.  Lymphadenopathy:     Cervical: No cervical adenopathy.  Skin:    General: Skin is warm.     Coloration: Skin is not jaundiced or pale.     Findings: No bruising, erythema, lesion or rash.  Neurological:     Mental Status: He is alert and oriented to person, place, and time.     Cranial Nerves: No cranial nerve deficit.     Deep Tendon Reflexes: Reflexes are normal and symmetric.     Wt Readings from Last 3 Encounters:  06/03/18 229  lb (103.9 kg)  03/03/18 244 lb (110.7 kg)  02/06/18 245 lb (111.1 kg)    BP 108/74   Pulse 94   Temp 98.4 F (36.9 C) (Oral)   Ht 5\' 11"  (1.803 m)   Wt 229 lb (103.9 kg)   SpO2 98%   BMI 31.94 kg/m   Assessment and Plan: 1. Pure hypercholesterolemia Chronic.  Controlled.  Will check renal function panel and lipid panel.  Continue low-cholesterol diet. - Renal Function Panel - Lipid Panel With LDL/HDL Ratio  2. Essential (primary) hypertension .  Controlled.  Continue lisinopril 40 mg once a day check renal function panel - lisinopril (ZESTRIL) 40 MG tablet; Take 1 tablet (40 mg total) by mouth daily.  Dispense: 90 tablet; Refill: 1  3. Gastroesophageal reflux disease, esophagitis presence not specified Chronic.  Controlled.  Will continue Protonix 40 mg once a day - pantoprazole (PROTONIX) 40 MG tablet; Take 1 tablet (40 mg total) by mouth daily.  Dispense: 90 tablet; Refill: 3  4. Low testosterone in male And has low total and free testosterone for which it is controlled symptomatically by AndroGel 1 pump daily - Testosterone (ANDROGEL PUMP) 20.25 MG/ACT (1.62%) GEL; Place 1 Pump onto the skin daily. Apply 1 pump to the forearm daily  Dispense: 75 g; Refill: 5  5. Seasonal allergic rhinitis due to pollen Patient has a history of allergic rhinitis.  Does best on Sudafed 120 mg every 12 hours as well as Flonase 1 spray in each nostril a day - pseudoephedrine (WAL-PHED 12 HOUR) 120 MG 12 hr tablet; TAKE 1 TABLET BY MOUTH EVERY 12 HOURS AS NEEDED FOR CONGESTION  Dispense: 30 tablet; Refill: 5 - fluticasone (FLONASE) 50 MCG/ACT nasal spray; SPRAY ONCE IN EACH NOSTRIL DAILY  Dispense: 16 g; Refill: 11  6. Vasculogenic erectile dysfunction, unspecified vasculogenic erectile dysfunction type Adequately controlled on Cialis 20 mg as needed - tadalafil (ADCIRCA/CIALIS) 20 MG tablet; Take 0.5-1 tablets (10-20 mg total) by mouth every other day as needed for erectile dysfunction.   Dispense: 10 tablet; Refill: 11  7. Thumb tendonitis Has new onset of swelling and discomfort in the proximal phalanx of the right thumb.  Patient relates no history of illness or injury patient will be referred to orthopedics which he is currently attending for shoulder arthritis.  In the meantime we will continue what he is taking for shoulder pain. - Ambulatory referral to Orthopedic Surgery

## 2018-06-04 LAB — RENAL FUNCTION PANEL
Albumin: 4.8 g/dL (ref 3.8–4.9)
BUN/Creatinine Ratio: 20 (ref 9–20)
BUN: 18 mg/dL (ref 6–24)
CO2: 24 mmol/L (ref 20–29)
Calcium: 9.8 mg/dL (ref 8.7–10.2)
Chloride: 103 mmol/L (ref 96–106)
Creatinine, Ser: 0.9 mg/dL (ref 0.76–1.27)
GFR calc Af Amer: 111 mL/min/{1.73_m2} (ref >59)
GFR calc non Af Amer: 96 mL/min/{1.73_m2} (ref >59)
Glucose: 80 mg/dL (ref 65–99)
Phosphorus: 3.7 mg/dL (ref 2.8–4.1)
Potassium: 4.4 mmol/L (ref 3.5–5.2)
Sodium: 141 mmol/L (ref 134–144)

## 2018-06-04 LAB — LIPID PANEL WITH LDL/HDL RATIO
Cholesterol, Total: 206 mg/dL — ABNORMAL HIGH (ref 100–199)
HDL: 55 mg/dL (ref >39)
LDL Calculated: 130 mg/dL — ABNORMAL HIGH (ref 0–99)
LDl/HDL Ratio: 2.4 ratio (ref 0.0–3.6)
Triglycerides: 105 mg/dL (ref 0–149)
VLDL Cholesterol Cal: 21 mg/dL (ref 5–40)

## 2018-07-15 ENCOUNTER — Ambulatory Visit: Payer: 59 | Admitting: Family Medicine

## 2018-08-26 ENCOUNTER — Other Ambulatory Visit: Payer: Self-pay | Admitting: Family Medicine

## 2018-08-26 DIAGNOSIS — J301 Allergic rhinitis due to pollen: Secondary | ICD-10-CM

## 2018-11-17 ENCOUNTER — Other Ambulatory Visit: Payer: Self-pay

## 2018-11-17 DIAGNOSIS — I10 Essential (primary) hypertension: Secondary | ICD-10-CM

## 2018-11-17 MED ORDER — LISINOPRIL 40 MG PO TABS: 40.0000 mg | ORAL_TABLET | Freq: Every day | ORAL | 0 refills | Status: DC

## 2018-11-24 ENCOUNTER — Ambulatory Visit (INDEPENDENT_AMBULATORY_CARE_PROVIDER_SITE_OTHER): Payer: 59 | Admitting: Family Medicine

## 2018-11-24 ENCOUNTER — Encounter: Payer: Self-pay | Admitting: Family Medicine

## 2018-11-24 ENCOUNTER — Other Ambulatory Visit: Payer: Self-pay

## 2018-11-24 VITALS — BP 122/80 | HR 80 | Ht 71.0 in | Wt 250.0 lb

## 2018-11-24 DIAGNOSIS — R7989 Other specified abnormal findings of blood chemistry: Secondary | ICD-10-CM

## 2018-11-24 DIAGNOSIS — I10 Essential (primary) hypertension: Secondary | ICD-10-CM

## 2018-11-24 DIAGNOSIS — Z23 Encounter for immunization: Secondary | ICD-10-CM

## 2018-11-24 DIAGNOSIS — N529 Male erectile dysfunction, unspecified: Secondary | ICD-10-CM

## 2018-11-24 DIAGNOSIS — J301 Allergic rhinitis due to pollen: Secondary | ICD-10-CM | POA: Diagnosis not present

## 2018-11-24 DIAGNOSIS — E7801 Familial hypercholesterolemia: Secondary | ICD-10-CM

## 2018-11-24 DIAGNOSIS — K219 Gastro-esophageal reflux disease without esophagitis: Secondary | ICD-10-CM

## 2018-11-24 DIAGNOSIS — R69 Illness, unspecified: Secondary | ICD-10-CM

## 2018-11-24 MED ORDER — PSEUDOEPHEDRINE HCL ER 120 MG PO TB12: ORAL_TABLET | ORAL | 11 refills | Status: DC

## 2018-11-24 MED ORDER — LISINOPRIL 40 MG PO TABS: 40.0000 mg | ORAL_TABLET | Freq: Every day | ORAL | 0 refills | Status: DC

## 2018-11-24 MED ORDER — TESTOSTERONE 20.25 MG/ACT (1.62%) TD GEL: 1.0000 | Freq: Every day | TRANSDERMAL | 5 refills | Status: DC

## 2018-11-24 MED ORDER — LISINOPRIL 40 MG PO TABS: 40.0000 mg | ORAL_TABLET | Freq: Every day | ORAL | 1 refills | Status: DC

## 2018-11-24 MED ORDER — PANTOPRAZOLE SODIUM 40 MG PO TBEC: 40.0000 mg | DELAYED_RELEASE_TABLET | Freq: Every day | ORAL | 1 refills | Status: DC

## 2018-11-24 MED ORDER — LORATADINE 10 MG PO TABS: 10.0000 mg | ORAL_TABLET | Freq: Every day | ORAL | 1 refills | Status: DC

## 2018-11-24 MED ORDER — LORATADINE 10 MG PO TABS: 10.0000 mg | ORAL_TABLET | Freq: Every day | ORAL | 11 refills | Status: DC

## 2018-11-24 MED ORDER — FLUTICASONE PROPIONATE 50 MCG/ACT NA SUSP: NASAL | 11 refills | Status: DC

## 2018-11-24 MED ORDER — TADALAFIL 20 MG PO TABS: 10.0000 mg | ORAL_TABLET | ORAL | 5 refills | Status: DC | PRN

## 2018-11-24 MED ORDER — OLOPATADINE HCL 0.6 % NA SOLN: 2.0000 | Freq: Two times a day (BID) | NASAL | 11 refills | Status: DC

## 2018-11-24 NOTE — Progress Notes (Signed)
Date:  11/24/2018   Name:  Tyler Glover   DOB:  12/21/1960   MRN:  [id number]   Chief Complaint: Allergic Rhinitis , Gastroesophageal Reflux, Hypertension, Erectile Dysfunction, hypotestosterone, and influenza vacc need  Gastroesophageal Reflux He reports no abdominal pain, no chest pain, no choking, no coughing, no dysphagia, no heartburn, no hoarse voice, no nausea, no sore throat, no tooth decay or no wheezing. This is a chronic problem. The current episode started 1 to 4 weeks ago. The problem has been gradually worsening. The symptoms are aggravated by certain foods. Pertinent negatives include no anemia, fatigue, melena, muscle weakness, orthopnea or weight loss. He has tried a PPI for the symptoms. The treatment provided moderate relief. Past procedures do not include an abdominal ultrasound, esophageal pH monitoring or a UGI.  Hypertension This is a chronic problem. The current episode started more than 1 year ago. The problem is controlled. Pertinent negatives include no anxiety, blurred vision, chest pain, headaches, malaise/fatigue, neck pain, orthopnea, palpitations, peripheral edema, PND, shortness of breath or sweats. Risk factors for coronary artery disease include dyslipidemia. Past treatments include ACE inhibitors. The current treatment provides moderate improvement. There are no compliance problems.  There is no history of angina, kidney disease, CAD/MI, CVA, heart failure, left ventricular hypertrophy, PVD or retinopathy. There is no history of chronic renal disease, a hypertension causing med or renovascular disease.  Erectile Dysfunction This is a chronic problem. The current episode started more than 1 year ago. The problem has been waxing and waning since onset. He reports no anxiety, decreased libido or performance anxiety. Irritative symptoms do not include frequency, nocturia or urgency. Obstructive symptoms do not include dribbling, incomplete emptying, an  intermittent stream, a slower stream, straining or a weak stream. Pertinent negatives include no chills, dysuria or hematuria.  URI  This is a chronic (for allergies) problem. The current episode started more than 1 year ago. The problem has been waxing and waning. Pertinent negatives include no abdominal pain, chest pain, coughing, diarrhea, dysuria, ear pain, headaches, nausea, neck pain, rash, sore throat or wheezing. He has tried decongestant and antihistamine (nasal steroids) for the symptoms. The treatment provided moderate relief.  Hyperlipidemia This is a chronic problem. The current episode started more than 1 year ago. The problem is controlled. Recent lipid tests were reviewed and are normal. He has no history of chronic renal disease. Pertinent negatives include no chest pain, focal sensory loss, focal weakness, leg pain, myalgias or shortness of breath. Current antihyperlipidemic treatment includes diet change.    Review of Systems  Constitutional: Negative for chills, fatigue, fever, malaise/fatigue and weight loss.  HENT: Negative for drooling, ear discharge, ear pain, hoarse voice and sore throat.   Eyes: Negative for blurred vision.  Respiratory: Negative for cough, choking, shortness of breath and wheezing.   Cardiovascular: Negative for chest pain, palpitations, orthopnea, leg swelling and PND.  Gastrointestinal: Negative for abdominal pain, blood in stool, constipation, diarrhea, dysphagia, heartburn, melena and nausea.  Endocrine: Negative for polydipsia.  Genitourinary: Negative for decreased libido, dysuria, frequency, hematuria, incomplete emptying, nocturia and urgency.  Musculoskeletal: Negative for back pain, myalgias, muscle weakness and neck pain.  Skin: Negative for rash.  Allergic/Immunologic: Negative for environmental allergies.  Neurological: Negative for dizziness, focal weakness and headaches.  Hematological: Does not bruise/bleed easily.    Psychiatric/Behavioral: Negative for suicidal ideas. The patient is not nervous/anxious.     Patient Active Problem List   Diagnosis Date Noted   Pure  hypercholesterolemia 01/26/2016   Right lateral epicondylitis 07/07/2015   Vitamin D deficiency 12/24/2014   Prediabetes 12/24/2014   Allergic rhinitis, seasonal 06/03/2014   Acid reflux 06/03/2014   Essential (primary) hypertension 06/03/2014   Family history of diabetes mellitus 06/03/2014   Family history of cardiac disorder 06/03/2014   Obesity (BMI 30.0-34.9) 06/03/2014    Allergies  Allergen Reactions   Penicillins     Told as child not to do injectable, but has taken Amoxicillin since Unknown    Past Surgical History:  Procedure Laterality Date   SHOULDER SURGERY      Social History   Tobacco Use   Smoking status: Never Smoker   Smokeless tobacco: Never Used  Substance Use Topics   Alcohol use: Yes    Alcohol/week: 0.0 standard drinks   Drug use: No     Medication list has been reviewed and updated.  Current Meds  Medication Sig   acetaminophen (TYLENOL) 325 MG tablet Take by mouth.   calcium carbonate (OS-CAL) 600 MG tablet Take 600 mg by mouth daily.   Cholecalciferol (VITAMIN D) 2000 units CAPS Take 1 capsule (2,000 Units total) by mouth daily.   Cyanocobalamin 1000 MCG CAPS Take 1 capsule by mouth daily.   diclofenac sodium (VOLTAREN) 1 % GEL Apply topically. otc   fluticasone (FLONASE) 50 MCG/ACT nasal spray USE 1 SPRAY IN EACH NOSTRIL DAILY   folic acid (FOLVITE) 1 MG tablet Take 1 tablet by mouth daily. Bock   hydroxychloroquine (PLAQUENIL) 200 MG tablet Take 1 tablet by mouth 2 (two) times daily. Dr Meda Coffee   ibuprofen (ADVIL) 200 MG tablet Take 1 tablet by mouth as needed.   lisinopril (ZESTRIL) 40 MG tablet Take 1 tablet (40 mg total) by mouth daily.   meloxicam (MOBIC) 15 MG tablet Take 1 tablet by mouth daily. Reche Dixon   methotrexate (RHEUMATREX) 2.5 MG tablet Take  8 tablets by mouth once a week. Bock   Multiple Vitamin (MULTIVITAMIN) capsule Take 1 capsule by mouth daily.   Olopatadine HCl 0.6 % SOLN Place 2 sprays into the nose 2 (two) times daily.   pantoprazole (PROTONIX) 40 MG tablet Take 1 tablet (40 mg total) by mouth daily.   predniSONE (DELTASONE) 5 MG tablet Take 1 tablet by mouth daily. Bock   pseudoephedrine (WAL-PHED 12 HOUR) 120 MG 12 hr tablet TAKE 1 TABLET BY MOUTH EVERY 12 HOURS AS NEEDED FOR CONGESTION   tadalafil (ADCIRCA/CIALIS) 20 MG tablet Take 0.5-1 tablets (10-20 mg total) by mouth every other day as needed for erectile dysfunction.   Testosterone (ANDROGEL PUMP) 20.25 MG/ACT (1.62%) GEL Place 1 Pump onto the skin daily. Apply 1 pump to the forearm daily   traMADol (ULTRAM) 50 MG tablet Take by mouth. Reche Dixon   Turmeric (RA TURMERIC EXTRA STRENGTH) 1053 MG TABS Take by mouth.   vitamin E (VITAMIN E) 400 UNIT capsule Take 400 Units by mouth daily.   [DISCONTINUED] b complex vitamins tablet Take 1 tablet by mouth daily.    PHQ 2/9 Scores 06/03/2018 08/22/2017 07/07/2015  PHQ - 2 Score 0 0 0  PHQ- 9 Score 0 - -    BP Readings from Last 3 Encounters:  11/24/18 122/80  06/03/18 108/74  03/03/18 130/80    Physical Exam Vitals signs and nursing note reviewed.  HENT:     Head: Normocephalic.     Right Ear: Tympanic membrane, ear canal and external ear normal.     Left Ear: Tympanic membrane, ear canal and  external ear normal.     Nose: Nose normal. No congestion or rhinorrhea.  Eyes:     General: No scleral icterus.       Right eye: No discharge.        Left eye: No discharge.     Conjunctiva/sclera: Conjunctivae normal.     Pupils: Pupils are equal, round, and reactive to light.  Neck:     Musculoskeletal: Normal range of motion and neck supple.     Thyroid: No thyromegaly.     Vascular: No JVD.     Trachea: No tracheal deviation.  Cardiovascular:     Rate and Rhythm: Normal rate and regular rhythm.      Heart sounds: Normal heart sounds. No murmur. No friction rub. No gallop.   Pulmonary:     Effort: No respiratory distress.     Breath sounds: Normal breath sounds. No stridor. No wheezing, rhonchi or rales.  Chest:     Chest wall: No tenderness.  Abdominal:     General: Bowel sounds are normal.     Palpations: Abdomen is soft. There is no mass.     Tenderness: There is no abdominal tenderness. There is no guarding or rebound.  Musculoskeletal: Normal range of motion.        General: No tenderness or signs of injury.     Left lower leg: No edema.  Lymphadenopathy:     Cervical: No cervical adenopathy.  Skin:    General: Skin is warm.     Findings: No rash.  Neurological:     Mental Status: He is alert and oriented to person, place, and time.     Cranial Nerves: No cranial nerve deficit.     Deep Tendon Reflexes: Reflexes are normal and symmetric.     Wt Readings from Last 3 Encounters:  11/24/18 250 lb (113.4 kg)  06/03/18 229 lb (103.9 kg)  03/03/18 244 lb (110.7 kg)    BP 122/80    Pulse 80    Ht 5\' 11"  (1.803 m)    Wt 250 lb (113.4 kg)    BMI 34.87 kg/m   Assessment and Plan: 1. Essential (primary) hypertension Chronic.  Controlled.  Stable.  Continue lisinopril 40 mg once a day.  Will check renal function panel.  Will recheck in 6 months. - lisinopril (ZESTRIL) 40 MG tablet; Take 1 tablet (40 mg total) by mouth daily.  Dispense: 30 tablet; Refill: 0 - Renal Function Panel  2. Seasonal allergic rhinitis due to pollen Chronic.  Controlled.  Continue Flonase 1 spray each nostril once a day Sudafed  every 12 hours.  Olopatadine 0.6% 2 sprays each nostril and loratadine 10 mg once a day. - fluticasone (FLONASE) 50 MCG/ACT nasal spray; USE 1 SPRAY IN EACH NOSTRIL DAILY  Dispense: 16 g; Refill: 11 - pseudoephedrine (WAL-PHED 12 HOUR) 120 MG 12 hr tablet; TAKE 1 TABLET BY MOUTH EVERY 12 HOURS AS NEEDED FOR CONGESTION  Dispense: 30 tablet; Refill: 11 - Olopatadine HCl 0.6 %  SOLN; Place 2 sprays into the nose 2 (two) times daily.  Dispense: 30.5 g; Refill: 11 - loratadine (CLARITIN) 10 MG tablet; Take 1 tablet (10 mg total) by mouth daily.  Dispense: 90 tablet; Refill: 11  3. Gastroesophageal reflux disease Chronic.  Partially controlled.  However patient is only taking it sporadically patient has been encouraged to take on a daily basis it will be refilled as pantoprazole 40 mg once a day. - pantoprazole (PROTONIX) 40 MG tablet; Take 1  tablet (40 mg total) by mouth daily.  Dispense: 90 tablet; Refill: 1  4. Vasculogenic erectile dysfunction, unspecified vasculogenic erectile dysfunction type Patient is controlled well on Cialis will continue 20 mg Cialis 1/2 to 1 tablet as needed as needed - tadalafil (CIALIS) 20 MG tablet; Take 0.5-1 tablets (10-20 mg total) by mouth every other day as needed for erectile dysfunction.  Dispense: 10 tablet; Refill: 5  5. Low testosterone in male Patient has low testosterone both total/and free.  Continue AndroGel 1.62% 1 pump on forearm daily - Testosterone (ANDROGEL PUMP) 20.25 MG/ACT (1.62%) GEL; Place 1 Pump onto the skin daily. Apply 1 pump to the forearm daily  Dispense: 75 g; Refill: 5  6. Familial hypercholesterolemia Chronic.  Controlled.  Patient currently with diet control but has noticed that LDL is been slightly elevated in the past however patient has had a recent weight gain due to prednisone and have concerns that this is going to be an issue will check lipid panel and may need to start Zetia - Lipid Panel With LDL/HDL Ratio  7. Taking medication for chronic disease Patient taking medication for chronic disease will check hepatic function panel.  Because of testosterone - Hepatic function panel

## 2018-11-24 NOTE — Patient Instructions (Signed)

## 2018-11-25 LAB — HEPATIC FUNCTION PANEL
ALT: 18 IU/L (ref 0–44)
AST: 17 IU/L (ref 0–40)
Alkaline Phosphatase: 92 IU/L (ref 39–117)
Bilirubin Total: 0.4 mg/dL (ref 0.0–1.2)
Bilirubin, Direct: 0.09 mg/dL (ref 0.00–0.40)
Total Protein: 7.1 g/dL (ref 6.0–8.5)

## 2018-11-25 LAB — LIPID PANEL WITH LDL/HDL RATIO
Cholesterol, Total: 221 mg/dL — ABNORMAL HIGH (ref 100–199)
HDL: 51 mg/dL (ref >39)
LDL Chol Calc (NIH): 138 mg/dL — ABNORMAL HIGH (ref 0–99)
LDL/HDL Ratio: 2.7 ratio (ref 0.0–3.6)
Triglycerides: 178 mg/dL — ABNORMAL HIGH (ref 0–149)
VLDL Cholesterol Cal: 32 mg/dL (ref 5–40)

## 2018-11-25 LAB — RENAL FUNCTION PANEL
Albumin: 4.9 g/dL (ref 3.8–4.9)
BUN/Creatinine Ratio: 9 (ref 9–20)
BUN: 10 mg/dL (ref 6–24)
CO2: 24 mmol/L (ref 20–29)
Calcium: 10.5 mg/dL — ABNORMAL HIGH (ref 8.7–10.2)
Chloride: 106 mmol/L (ref 96–106)
Creatinine, Ser: 1.08 mg/dL (ref 0.76–1.27)
GFR calc Af Amer: 88 mL/min/{1.73_m2} (ref >59)
GFR calc non Af Amer: 76 mL/min/{1.73_m2} (ref >59)
Glucose: 91 mg/dL (ref 65–99)
Phosphorus: 3.1 mg/dL (ref 2.8–4.1)
Potassium: 4.3 mmol/L (ref 3.5–5.2)
Sodium: 144 mmol/L (ref 134–144)

## 2019-01-05 ENCOUNTER — Other Ambulatory Visit: Payer: Self-pay

## 2019-01-05 DIAGNOSIS — J301 Allergic rhinitis due to pollen: Secondary | ICD-10-CM

## 2019-01-05 MED ORDER — PSEUDOEPHEDRINE HCL ER 120 MG PO TB12: ORAL_TABLET | ORAL | 5 refills | Status: DC

## 2019-05-01 ENCOUNTER — Ambulatory Visit: Payer: 59 | Attending: Internal Medicine

## 2019-05-01 DIAGNOSIS — Z23 Encounter for immunization: Secondary | ICD-10-CM

## 2019-05-01 NOTE — Progress Notes (Signed)
   Covid-19 Vaccination Clinic  Name:  Normand Doll    MRN: [id number] DOB: 1960/03/30  05/01/2019  Mr. Aye was observed post Covid-19 immunization for 15 minutes without incident. He was provided with Vaccine Information Sheet and instruction to access the V-Safe system.   Mr. Weida was instructed to call 911 with any severe reactions post vaccine: Marland Kitchen Difficulty breathing  . Swelling of face and throat  . A fast heartbeat  . A bad rash all over body  . Dizziness and weakness   Immunizations Administered    Name Date Dose VIS Date Route   Pfizer COVID-19 Vaccine 05/01/2019  4:29 PM 0.3 mL 01/23/2019 Intramuscular   Manufacturer: [place]   Lot: [id number]   [place]: [id number]

## 2019-05-27 ENCOUNTER — Ambulatory Visit: Payer: 59 | Attending: Internal Medicine

## 2019-05-27 DIAGNOSIS — Z23 Encounter for immunization: Secondary | ICD-10-CM

## 2019-05-27 NOTE — Progress Notes (Signed)
   Covid-19 Vaccination Clinic  Name:  Cato Whitaker    MRN: [id number] DOB: 06-11-1960  05/27/2019  Mr. Saley was observed post Covid-19 immunization for 15 minutes without incident. He was provided with Vaccine Information Sheet and instruction to access the V-Safe system.   Mr. Baughn was instructed to call 911 with any severe reactions post vaccine: Marland Kitchen Difficulty breathing  . Swelling of face and throat  . A fast heartbeat  . A bad rash all over body  . Dizziness and weakness   Immunizations Administered    Name Date Dose VIS Date Route   Pfizer COVID-19 Vaccine 05/27/2019  4:20 PM 0.3 mL 01/23/2019 Intramuscular   Manufacturer: [place]   Lot: [id number]   [place]: [id number]

## 2019-06-08 ENCOUNTER — Encounter: Payer: 59 | Admitting: Family Medicine

## 2019-06-22 ENCOUNTER — Encounter: Payer: 59 | Admitting: Family Medicine

## 2019-06-30 ENCOUNTER — Other Ambulatory Visit: Payer: Self-pay

## 2019-06-30 ENCOUNTER — Ambulatory Visit (INDEPENDENT_AMBULATORY_CARE_PROVIDER_SITE_OTHER): Payer: 59 | Admitting: Family Medicine

## 2019-06-30 ENCOUNTER — Encounter: Payer: Self-pay | Admitting: Family Medicine

## 2019-06-30 VITALS — BP 128/64 | HR 80 | Ht 71.0 in | Wt 247.0 lb

## 2019-06-30 DIAGNOSIS — R7989 Other specified abnormal findings of blood chemistry: Secondary | ICD-10-CM | POA: Diagnosis not present

## 2019-06-30 DIAGNOSIS — M65331 Trigger finger, right middle finger: Secondary | ICD-10-CM

## 2019-06-30 DIAGNOSIS — K219 Gastro-esophageal reflux disease without esophagitis: Secondary | ICD-10-CM

## 2019-06-30 DIAGNOSIS — R7303 Prediabetes: Secondary | ICD-10-CM | POA: Diagnosis not present

## 2019-06-30 DIAGNOSIS — Z0000 Encounter for general adult medical examination without abnormal findings: Secondary | ICD-10-CM

## 2019-06-30 DIAGNOSIS — Z1211 Encounter for screening for malignant neoplasm of colon: Secondary | ICD-10-CM

## 2019-06-30 DIAGNOSIS — E669 Obesity, unspecified: Secondary | ICD-10-CM

## 2019-06-30 DIAGNOSIS — E7800 Pure hypercholesterolemia, unspecified: Secondary | ICD-10-CM

## 2019-06-30 DIAGNOSIS — I10 Essential (primary) hypertension: Secondary | ICD-10-CM

## 2019-06-30 MED ORDER — FAMOTIDINE 40 MG PO TABS: 40.0000 mg | ORAL_TABLET | Freq: Every day | ORAL | 1 refills | Status: DC

## 2019-06-30 MED ORDER — LISINOPRIL 40 MG PO TABS: 40.0000 mg | ORAL_TABLET | Freq: Every day | ORAL | 1 refills | Status: DC

## 2019-06-30 NOTE — Patient Instructions (Signed)
Mediterranean Diet A Mediterranean diet refers to food and lifestyle choices that are based on the traditions of countries located on the [place]. This way of eating has been shown to help prevent certain conditions and improve outcomes for people who have chronic diseases, like kidney disease and heart disease. What are tips for following this plan? Lifestyle  Cook and eat meals together with your family, when possible.  Drink enough fluid to keep your urine clear or pale yellow.  Be physically active every day. This includes: ? Aerobic exercise like running or swimming. ? Leisure activities like gardening, walking, or housework.  Get 7-8 hours of sleep each night.  If recommended by your health care provider, drink red wine in moderation. This means 1 glass a day for nonpregnant women and 2 glasses a day for men. A glass of wine equals 5 oz (150 mL). Reading food labels  Check the serving size of packaged foods. For foods such as rice and pasta, the serving size refers to the amount of cooked product, not dry.  Check the total fat in packaged foods. Avoid foods that have saturated fat or trans fats.  Check the ingredients list for added sugars, such as corn syrup.   Shopping  At the grocery store, buy most of your food from the areas near the walls of the store. This includes: ? Fresh fruits and vegetables (produce). ? Grains, beans, nuts, and seeds. Some of these may be available in unpackaged forms or large amounts (in bulk). ? Fresh seafood. ? Poultry and eggs. ? Low-fat dairy products.  Buy whole ingredients instead of prepackaged foods.  Buy fresh fruits and vegetables in-season from local farmers markets.  Buy frozen fruits and vegetables in resealable bags.  If you do not have access to quality fresh seafood, buy precooked frozen shrimp or canned fish, such as tuna, salmon, or sardines.  Buy small amounts of raw or cooked vegetables, salads, or olives from  the deli or salad bar at your store.  Stock your pantry so you always have certain foods on hand, such as olive oil, canned tuna, canned tomatoes, rice, pasta, and beans. Cooking  Cook foods with extra-virgin olive oil instead of using butter or other vegetable oils.  Have meat as a side dish, and have vegetables or grains as your main dish. This means having meat in small portions or adding small amounts of meat to foods like pasta or stew.  Use beans or vegetables instead of meat in common dishes like chili or lasagna.  Experiment with different cooking methods. Try roasting or broiling vegetables instead of steaming or sauteing them.  Add frozen vegetables to soups, stews, pasta, or rice.  Add nuts or seeds for added healthy fat at each meal. You can add these to yogurt, salads, or vegetable dishes.  Marinate fish or vegetables using olive oil, lemon juice, garlic, and fresh herbs. Meal planning  Plan to eat 1 vegetarian meal one day each week. Try to work up to 2 vegetarian meals, if possible.  Eat seafood 2 or more times a week.  Have healthy snacks readily available, such as: ? Vegetable sticks with hummus. ? Greek yogurt. ? Fruit and nut trail mix.  Eat balanced meals throughout the week. This includes: ? Fruit: 2-3 servings a day ? Vegetables: 4-5 servings a day ? Low-fat dairy: 2 servings a day ? Fish, poultry, or lean meat: 1 serving a day ? Beans and legumes: 2 or more servings a week ?   Nuts and seeds: 1-2 servings a day ? Whole grains: 6-8 servings a day ? Extra-virgin olive oil: 3-4 servings a day  Limit red meat and sweets to only a few servings a month   What are my food choices?  Mediterranean diet ? Recommended  Grains: Whole-grain pasta. Brown rice. Bulgar wheat. Polenta. Couscous. Whole-wheat bread. Oatmeal. Quinoa.  Vegetables: Artichokes. Beets. Broccoli. Cabbage. Carrots. Eggplant. Green beans. Chard. Kale. Spinach. Onions. Leeks. Peas. Squash.  Tomatoes. Peppers. Radishes.  Fruits: Apples. Apricots. Avocado. Berries. Bananas. Cherries. Dates. Figs. Grapes. Lemons. Melon. Oranges. Peaches. Plums. Pomegranate.  Meats and other protein foods: Beans. Almonds. Sunflower seeds. Pine nuts. Peanuts. Cod. Salmon. Scallops. Shrimp. Tuna. Tilapia. Clams. Oysters. Eggs.  Dairy: Low-fat milk. Cheese. Greek yogurt.  Beverages: Water. Red wine. Herbal tea.  Fats and oils: Extra virgin olive oil. Avocado oil. Grape seed oil.  Sweets and desserts: Greek yogurt with honey. Baked apples. Poached pears. Trail mix.  Seasoning and other foods: Basil. Cilantro. Coriander. Cumin. Mint. Parsley. Sage. Rosemary. Tarragon. Garlic. Oregano. Thyme. Pepper. Balsalmic vinegar. Tahini. Hummus. Tomato sauce. Olives. Mushrooms. ? Limit these  Grains: Prepackaged pasta or rice dishes. Prepackaged cereal with added sugar.  Vegetables: Deep fried potatoes (french fries).  Fruits: Fruit canned in syrup.  Meats and other protein foods: Beef. Pork. Lamb. Poultry with skin. Hot dogs. Bacon.  Dairy: Ice cream. Sour cream. Whole milk.  Beverages: Juice. Sugar-sweetened soft drinks. Beer. Liquor and spirits.  Fats and oils: Butter. Canola oil. Vegetable oil. Beef fat (tallow). Lard.  Sweets and desserts: Cookies. Cakes. Pies. Candy.  Seasoning and other foods: Mayonnaise. Premade sauces and marinades. The items listed may not be a complete list. Talk with your dietitian about what dietary choices are right for you. Summary  The Mediterranean diet includes both food and lifestyle choices.  Eat a variety of fresh fruits and vegetables, beans, nuts, seeds, and whole grains.  Limit the amount of red meat and sweets that you eat.  Talk with your health care provider about whether it is safe for you to drink red wine in moderation. This means 1 glass a day for nonpregnant women and 2 glasses a day for men. A glass of wine equals 5 oz (150 mL). This information  is not intended to replace advice given to you by your health care provider. Make sure you discuss any questions you have with your health care provider. Document Revised: 09/29/2015 Document Reviewed: 09/22/2015 Elsevier Patient Education  2020 [employer]  

## 2019-06-30 NOTE — Progress Notes (Signed)
Date:  06/30/2019   Name:  Tyler Glover   DOB:  Jun 17, 1960   MRN:  [id number]   Chief Complaint: Annual Exam (needs referral to gi for colon and GERD)  Patient is a 59 year old male who presents for a comprehensive physical exam. The patient reports the following problems: RA/trigger finger. Health maintenance has been reviewed colonoscopy schedule.   Lab Results  Component Value Date   CREATININE 1.08 11/24/2018   BUN 10 11/24/2018   NA 144 11/24/2018   K 4.3 11/24/2018   CL 106 11/24/2018   CO2 24 11/24/2018   Lab Results  Component Value Date   CHOL 221 (H) 11/24/2018   HDL 51 11/24/2018   LDLCALC 138 (H) 11/24/2018   TRIG 178 (H) 11/24/2018   CHOLHDL 4.6 08/22/2017   No results found for: TSH Lab Results  Component Value Date   HGBA1C 5.7 (H) 08/22/2017   No results found for: WBC, HGB, HCT, MCV, PLT Lab Results  Component Value Date   ALT 18 11/24/2018   AST 17 11/24/2018   ALKPHOS 92 11/24/2018   BILITOT 0.4 11/24/2018     Review of Systems  Patient Active Problem List   Diagnosis Date Noted  . Pure hypercholesterolemia 01/26/2016  . Right lateral epicondylitis 07/07/2015  . Vitamin D deficiency 12/24/2014  . Prediabetes 12/24/2014  . Allergic rhinitis, seasonal 06/03/2014  . Acid reflux 06/03/2014  . Essential (primary) hypertension 06/03/2014  . Family history of diabetes mellitus 06/03/2014  . Family history of cardiac disorder 06/03/2014  . Obesity (BMI 30.0-34.9) 06/03/2014    Allergies  Allergen Reactions  . Penicillins     Told as child not to do injectable, but has taken Amoxicillin since Unknown    Past Surgical History:  Procedure Laterality Date  . SHOULDER SURGERY      Social History   Tobacco Use  . Smoking status: Never Smoker  . Smokeless tobacco: Never Used  Substance Use Topics  . Alcohol use: Yes    Alcohol/week: 0.0 standard drinks  . Drug use: No     Medication list has been reviewed and  updated.  Current Meds  Medication Sig  . acetaminophen (TYLENOL) 325 MG tablet Take by mouth.  . Adalimumab 40 MG/0.4ML PNKT Inject into the skin. Dr Meda Coffee  . calcium carbonate (OS-CAL) 600 MG tablet Take 600 mg by mouth daily.  . Cholecalciferol (VITAMIN D) 2000 units CAPS Take 1 capsule (2,000 Units total) by mouth daily.  . Cyanocobalamin 1000 MCG CAPS Take 1 capsule by mouth daily.  . diclofenac sodium (VOLTAREN) 1 % GEL Apply topically. otc  . fluticasone (FLONASE) 50 MCG/ACT nasal spray USE 1 SPRAY IN EACH NOSTRIL DAILY  . folic acid (FOLVITE) 1 MG tablet Take 1 tablet by mouth daily. Bock  . hydroxychloroquine (PLAQUENIL) 200 MG tablet Take 1 tablet by mouth 2 (two) times daily. Dr Meda Coffee  . ibuprofen (ADVIL) 200 MG tablet Take 1 tablet by mouth as needed.  Marland Kitchen lisinopril (ZESTRIL) 40 MG tablet Take 1 tablet (40 mg total) by mouth daily.  Marland Kitchen loratadine (CLARITIN) 10 MG tablet Take 1 tablet (10 mg total) by mouth daily.  . methotrexate (RHEUMATREX) 2.5 MG tablet Take 8 tablets by mouth once a week. Meda Coffee  . Multiple Vitamin (MULTIVITAMIN) capsule Take 1 capsule by mouth daily.  . naproxen (NAPROSYN) 500 MG tablet Take 1 tablet (500 mg total) by mouth 2 (two) times daily with a meal.  . pantoprazole (PROTONIX)  40 MG tablet Take 1 tablet (40 mg total) by mouth daily.  . pseudoephedrine (WAL-PHED 12 HOUR) 120 MG 12 hr tablet TAKE 1 TABLET BY MOUTH EVERY 12 HOURS AS NEEDED FOR CONGESTION  . tadalafil (CIALIS) 20 MG tablet Take 0.5-1 tablets (10-20 mg total) by mouth every other day as needed for erectile dysfunction.  . Testosterone (ANDROGEL PUMP) 20.25 MG/ACT (1.62%) GEL Place 1 Pump onto the skin daily. Apply 1 pump to the forearm daily  . Turmeric (RA TURMERIC EXTRA STRENGTH) 1053 MG TABS Take by mouth.  . vitamin E (VITAMIN E) 400 UNIT capsule Take 400 Units by mouth daily.    PHQ 2/9 Scores 06/30/2019 06/03/2018 08/22/2017 07/07/2015  PHQ - 2 Score 0 0 0 0  PHQ- 9 Score 0 0 - -    BP  Readings from Last 3 Encounters:  06/30/19 128/64  11/24/18 122/80  06/03/18 108/74    Physical Exam Vitals and nursing note reviewed.  Constitutional:      Appearance: He is overweight.  HENT:     Head: Normocephalic.     Jaw: There is normal jaw occlusion.     Salivary Glands: Right salivary gland is not diffusely enlarged or tender. Left salivary gland is not diffusely enlarged or tender.     Right Ear: Tympanic membrane, ear canal and external ear normal.     Left Ear: Tympanic membrane, ear canal and external ear normal.     Nose: Nose normal.     Right Turbinates: Not enlarged.     Left Turbinates: Not enlarged.     Right Sinus: No maxillary sinus tenderness or frontal sinus tenderness.     Left Sinus: No maxillary sinus tenderness or frontal sinus tenderness.     Mouth/Throat:     Lips: Pink.     Mouth: Mucous membranes are moist.     Dentition: Normal dentition.     Tongue: No lesions.     Palate: No mass.     Pharynx: Oropharynx is clear. Uvula midline.  Eyes:     General: Lids are normal. Vision grossly intact. Gaze aligned appropriately. No scleral icterus.       Right eye: No discharge.        Left eye: No discharge.     Extraocular Movements: Extraocular movements intact.     Conjunctiva/sclera: Conjunctivae normal.     Pupils: Pupils are equal, round, and reactive to light.     Funduscopic exam:    Right eye: Red reflex present.        Left eye: Red reflex present. Neck:     Thyroid: No thyroid mass, thyromegaly or thyroid tenderness.     Vascular: Normal carotid pulses. No carotid bruit, hepatojugular reflux or JVD.     Trachea: Trachea normal. No tracheal deviation.  Cardiovascular:     Rate and Rhythm: Normal rate and regular rhythm.     Chest Wall: PMI is not displaced. No thrill.     Pulses: Normal pulses.          Carotid pulses are 2+ on the right side and 2+ on the left side.      Radial pulses are 2+ on the right side and 2+ on the left side.        Femoral pulses are 2+ on the right side and 2+ on the left side.      Popliteal pulses are 2+ on the right side and 2+ on the left side.  Dorsalis pedis pulses are 2+ on the right side and 2+ on the left side.       Posterior tibial pulses are 2+ on the right side and 2+ on the left side.     Heart sounds: Normal heart sounds, S1 normal and S2 normal. No murmur. No systolic murmur. No diastolic murmur. No friction rub. No gallop. No S3 or S4 sounds.   Pulmonary:     Effort: No respiratory distress.     Breath sounds: Normal breath sounds. No decreased breath sounds, wheezing, rhonchi or rales.  Chest:     Chest wall: No mass.     Breasts: Breasts are symmetrical.        Right: Normal. No swelling or bleeding.        Left: Normal. No swelling or bleeding.  Abdominal:     General: Bowel sounds are normal.     Palpations: Abdomen is soft. There is no hepatomegaly, splenomegaly or mass.     Tenderness: There is no abdominal tenderness. There is no right CVA tenderness, left CVA tenderness, guarding or rebound.     Hernia: There is no hernia in the umbilical area, left inguinal area or right inguinal area.  Genitourinary:    Pubic Area: No rash.      Penis: Normal and circumcised.      Testes: Normal.        Right: Mass or tenderness not present.        Left: Mass or tenderness not present.     Epididymis:     Right: Normal.     Left: Normal.  Musculoskeletal:        General: No tenderness. Normal range of motion.     Cervical back: Normal, full passive range of motion without pain, normal range of motion and neck supple.     Thoracic back: Normal.     Lumbar back: Normal.     Right lower leg: No edema.     Left lower leg: No edema.  Feet:     Right foot:     Protective Sensation: 10 sites tested. 10 sites sensed.     Skin integrity: Skin integrity normal.     Toenail Condition: Right toenails are normal.     Left foot:     Protective Sensation: 10 sites tested. 10 sites  sensed.     Skin integrity: Skin integrity normal.     Toenail Condition: Left toenails are normal.  Lymphadenopathy:     Head:     Right side of head: No submandibular adenopathy.     Left side of head: No submandibular adenopathy.     Cervical: No cervical adenopathy.     Right cervical: No superficial, deep or posterior cervical adenopathy.    Left cervical: No superficial, deep or posterior cervical adenopathy.  Skin:    General: Skin is warm.     Capillary Refill: Capillary refill takes less than 2 seconds.     Findings: No rash.  Neurological:     Mental Status: He is alert and oriented to person, place, and time.     Cranial Nerves: Cranial nerves are intact. No cranial nerve deficit.     Sensory: Sensation is intact. No sensory deficit.     Motor: Motor function is intact. No weakness.     Deep Tendon Reflexes: Reflexes are normal and symmetric.     Reflex Scores:      Tricep reflexes are 2+ on the right side and 2+  on the left side.      Bicep reflexes are 2+ on the right side and 2+ on the left side.      Brachioradialis reflexes are 2+ on the right side and 2+ on the left side.      Patellar reflexes are 2+ on the right side and 2+ on the left side.      Achilles reflexes are 2+ on the right side and 2+ on the left side. Psychiatric:        Attention and Perception: Attention and perception normal.        Mood and Affect: Mood and affect normal.        Speech: Speech normal.        Behavior: Behavior normal.        Thought Content: Thought content normal.        Cognition and Memory: Cognition normal.     Wt Readings from Last 3 Encounters:  06/30/19 247 lb (112 kg)  11/24/18 250 lb (113.4 kg)  06/03/18 229 lb (103.9 kg)    BP 128/64   Pulse 80   Ht 5\' 11"  (1.803 m)   Wt 247 lb (112 kg)   BMI 34.45 kg/m   Assessment and Plan: 1. Annual physical exam No subjective/objective concerns noted during history and physical exam.Sabien Gene Shedden is a 59 y.o.  male who presents today for his Complete Annual Exam patient's previous encounters were reviewed and there is no concerns noted.  Patient also had review of his most recent imaging, most recent labs, and most recent care everywhere.Marland Kitchen He feels well. He reports exercising . He reports he is sleeping well.Immunizations are reviewed and recommendations provided.   Age appropriate screening tests are discussed. Counseling given for risk factor reduction interventions.  CMP and lipid profile were obtained.  2. Pure hypercholesterolemia Chronic.  Persistent.  Primarily elevated LDL.  Will obtain lipid panel for evaluation patient has been given a weight loss diet for weight control and control of lipids.  3. Prediabetes It is noted that the patient has had a past history of prediabetes for which we will obtain an A1c to determine if this is still the same. - Hemoglobin A1c - Comprehensive metabolic panel - Lipid Panel With LDL/HDL Ratio  4. Low testosterone in male Patient has had some fatigue and is questioning whether or not there may be some low testosterone it has been noted that patient had a low total testosterone we will repeat testosterone total and free to see if both around a abnormal range. - Testosterone,Free and Total  5. Gastroesophageal reflux disease, unspecified whether esophagitis present Chronic.  Controlled.  Stable.  Patient is currently on pantoprazole but is concerned about staying on it for an indefinite period of time.  We will initiate famotidine 40 mg once a day and as tolerated will continue since this is not 100% as a PPI would be. - famotidine (PEPCID) 40 MG tablet; Take 1 tablet (40 mg total) by mouth daily.  Dispense: 30 tablet; Refill: 1  6. Trigger middle finger of right hand New onset.  Patient has a middle finger that occasionally when he sleeps will awaken in a triggered position.  This is easily reducible and we will watch this but if this continues to be a problem  we will refer to orthopedics.  7. Essential (primary) hypertension Chronic.  Controlled.  Stable.  Will check a CMP in the meantime we will continue lisinopril 40 mg once a day. -  Comprehensive metabolic panel - lisinopril (ZESTRIL) 40 MG tablet; Take 1 tablet (40 mg total) by mouth daily.  Dispense: 90 tablet; Refill: 1  8. Colon cancer screening Patient has been screened for colon cancer screening Hemoccult is negative and we will refer to gastroenterology for evaluation. - Ambulatory referral to Gastroenterology  9. Obesity (BMI 30.0-34.9) Patient is given a weight loss diet to limit calories and we have suggested starting at 1500-calorie diet.

## 2019-07-03 ENCOUNTER — Telehealth: Payer: Self-pay | Admitting: Family Medicine

## 2019-07-03 NOTE — Telephone Encounter (Signed)
Called [facility]- will get results, hopefully, on Monday

## 2019-07-03 NOTE — Telephone Encounter (Unsigned)
Copied from [place] [id number]. Topic: Quick Communication - Lab Results (Clinic Use ONLY) >> Jul 03, 2019 11:46 AM Scherrie Gerlach wrote: Reason for CRM: pt states he was to call [name] back today in reference to some labs. Please call back

## 2019-07-04 LAB — HEMOGLOBIN A1C
Est. average glucose Bld gHb Est-mCnc: 105 mg/dL
Hgb A1c MFr Bld: 5.3 % (ref 4.8–5.6)

## 2019-07-04 LAB — COMPREHENSIVE METABOLIC PANEL
ALT: 26 IU/L (ref 0–44)
AST: 25 IU/L (ref 0–40)
Albumin/Globulin Ratio: 2.2 (ref 1.2–2.2)
Albumin: 4.7 g/dL (ref 3.8–4.9)
Alkaline Phosphatase: 66 IU/L (ref 48–121)
BUN/Creatinine Ratio: 12 (ref 9–20)
BUN: 13 mg/dL (ref 6–24)
Bilirubin Total: 0.6 mg/dL (ref 0.0–1.2)
CO2: 20 mmol/L (ref 20–29)
Calcium: 9.2 mg/dL (ref 8.7–10.2)
Chloride: 105 mmol/L (ref 96–106)
Creatinine, Ser: 1.12 mg/dL (ref 0.76–1.27)
GFR calc Af Amer: 84 mL/min/{1.73_m2} (ref >59)
GFR calc non Af Amer: 73 mL/min/{1.73_m2} (ref >59)
Globulin, Total: 2.1 g/dL (ref 1.5–4.5)
Glucose: 84 mg/dL (ref 65–99)
Potassium: 4.5 mmol/L (ref 3.5–5.2)
Sodium: 139 mmol/L (ref 134–144)
Total Protein: 6.8 g/dL (ref 6.0–8.5)

## 2019-07-04 LAB — LIPID PANEL WITH LDL/HDL RATIO
Cholesterol, Total: 192 mg/dL (ref 100–199)
HDL: 47 mg/dL (ref >39)
LDL Chol Calc (NIH): 112 mg/dL — ABNORMAL HIGH (ref 0–99)
LDL/HDL Ratio: 2.4 ratio (ref 0.0–3.6)
Triglycerides: 192 mg/dL — ABNORMAL HIGH (ref 0–149)
VLDL Cholesterol Cal: 33 mg/dL (ref 5–40)

## 2019-07-04 LAB — TESTOSTERONE,FREE AND TOTAL
Testosterone, Free: 8.1 pg/mL (ref 7.2–24.0)
Testosterone: 423 ng/dL (ref 264–916)

## 2019-07-06 ENCOUNTER — Telehealth: Payer: 59

## 2019-07-06 ENCOUNTER — Telehealth (INDEPENDENT_AMBULATORY_CARE_PROVIDER_SITE_OTHER): Payer: 59 | Admitting: Gastroenterology

## 2019-07-06 ENCOUNTER — Other Ambulatory Visit: Payer: Self-pay

## 2019-07-06 DIAGNOSIS — K219 Gastro-esophageal reflux disease without esophagitis: Secondary | ICD-10-CM

## 2019-07-06 DIAGNOSIS — Z1211 Encounter for screening for malignant neoplasm of colon: Secondary | ICD-10-CM

## 2019-07-06 NOTE — Progress Notes (Signed)
Gastroenterology Pre-Procedure Review  Request Date: 08/21/19 Requesting Physician: Dr. Allen Norris  PATIENT REVIEW QUESTIONS: The patient responded to the following health history questions as indicated:    1. Are you having any GI issues? yes (GERD Pt states he takes medication for this, however would like an EGD performed.  I will discuss with Dr. Dorothey Baseman nurse.  I've also asked patient to have Dr. Ronnald Ramp to update referral to add an EGD due to GERD) 2. Do you have a personal history of Polyps? no 3. Do you have a family history of Colon Cancer or Polyps? no 4. Diabetes Mellitus? no 5. Joint replacements in the past 12 months?no 6. Major health problems in the past 3 months?no 7. Any artificial heart valves, MVP, or defibrillator?no    MEDICATIONS & ALLERGIES:    Patient reports the following regarding taking any anticoagulation/antiplatelet therapy:   Plavix, Coumadin, Eliquis, Xarelto, Lovenox, Pradaxa, Brilinta, or Effient? no Aspirin? no  Patient confirms/reports the following medications:  Current Outpatient Medications  Medication Sig Dispense Refill  . acetaminophen (TYLENOL) 325 MG tablet Take by mouth.    . Adalimumab 40 MG/0.4ML PNKT Inject into the skin. Dr Meda Coffee    . calcium carbonate (OS-CAL) 600 MG tablet Take 600 mg by mouth daily.    . Cholecalciferol (VITAMIN D) 2000 units CAPS Take 1 capsule (2,000 Units total) by mouth daily. 30 capsule   . Cyanocobalamin 1000 MCG CAPS Take 1 capsule by mouth daily.    . diclofenac sodium (VOLTAREN) 1 % GEL Apply topically. otc    . famotidine (PEPCID) 40 MG tablet Take 1 tablet (40 mg total) by mouth daily. 30 tablet 1  . fluticasone (FLONASE) 50 MCG/ACT nasal spray USE 1 SPRAY IN EACH NOSTRIL DAILY 16 g 11  . folic acid (FOLVITE) 1 MG tablet Take 1 tablet by mouth daily. Bock    . hydroxychloroquine (PLAQUENIL) 200 MG tablet Take 1 tablet by mouth 2 (two) times daily. Dr Meda Coffee    . ibuprofen (ADVIL) 200 MG tablet Take 1 tablet by mouth  as needed.    Marland Kitchen lisinopril (ZESTRIL) 40 MG tablet Take 1 tablet (40 mg total) by mouth daily. 90 tablet 1  . loratadine (CLARITIN) 10 MG tablet Take 1 tablet (10 mg total) by mouth daily. 90 tablet 11  . methotrexate (RHEUMATREX) 2.5 MG tablet Take 8 tablets by mouth once a week. Meda Coffee    . Multiple Vitamin (MULTIVITAMIN) capsule Take 1 capsule by mouth daily.    . naproxen (NAPROSYN) 500 MG tablet Take 1 tablet (500 mg total) by mouth 2 (two) times daily with a meal. 60 tablet 1  . pseudoephedrine (WAL-PHED 12 HOUR) 120 MG 12 hr tablet TAKE 1 TABLET BY MOUTH EVERY 12 HOURS AS NEEDED FOR CONGESTION 30 tablet 5  . tadalafil (CIALIS) 20 MG tablet Take 0.5-1 tablets (10-20 mg total) by mouth every other day as needed for erectile dysfunction. 10 tablet 5  . Turmeric (RA TURMERIC EXTRA STRENGTH) 1053 MG TABS Take by mouth.    . vitamin E (VITAMIN E) 400 UNIT capsule Take 400 Units by mouth daily.    . predniSONE (DELTASONE) 5 MG tablet Take 1 tablet by mouth daily. Meda Coffee    . Testosterone (ANDROGEL PUMP) 20.25 MG/ACT (1.62%) GEL Place 1 Pump onto the skin daily. Apply 1 pump to the forearm daily (Patient not taking: Reported on 07/06/2019) 75 g 5   No current facility-administered medications for this visit.    Patient confirms/reports the following  allergies:  Allergies  Allergen Reactions  . Penicillins     Told as child not to do injectable, but has taken Amoxicillin since Unknown    No orders of the defined types were placed in this encounter.   AUTHORIZATION INFORMATION Primary Insurance: 1D#: Group #:  Secondary Insurance: 1D#: Group #:  SCHEDULE INFORMATION: Date: Friday 08/21/19 Time: Location:[facility]

## 2019-07-07 NOTE — Addendum Note (Signed)
Addended by: Fredderick Severance on: 07/07/2019 07:43 AM   Modules accepted: Orders

## 2019-07-27 ENCOUNTER — Telehealth: Payer: Self-pay | Admitting: Family Medicine

## 2019-07-27 NOTE — Telephone Encounter (Signed)
Copied from [place] [id number]. Topic: General - Other >> Jul 27, 2019  2:50 PM Keene Breath wrote: Reason for CRM: Patient called to speak with [name] regarding seeing a rheumatoid arthritis specialist.  He stated that he had some questions to ask her.  Did not want to give any more information.  Please advise and have [name] give him a call back at [phone]

## 2019-07-27 NOTE — Telephone Encounter (Signed)
Called and spoke with pt. Dr Meda Coffee at [facility] in [facility] is retiring. Pt wants to know who Dr Ronnald Ramp recommend to see personally. He said he received a letter stating there is another physician looking to take his place but wants to know if Dr Ronnald Ramp recommends them. Will await Tara's call tomorrow after she speaks with the office manager at their clinic- [place].  CM

## 2019-07-28 NOTE — Telephone Encounter (Signed)
Spoke to pt- advised to stay with new doctor- Dr Posey Pronto coming to Rheumatology in July

## 2019-07-31 ENCOUNTER — Telehealth: Payer: Self-pay | Admitting: Family Medicine

## 2019-07-31 NOTE — Telephone Encounter (Unsigned)
Copied from [place] [id number]. Topic: General - Call Back - No Documentation >> Jul 31, 2019  2:32 PM Erick Blinks wrote: Reason for CRM: Pt is interested in upper GI examination for acid reflux. Please advise, wants to speak with [name] specifically. Best contact [phone]

## 2019-08-03 NOTE — Telephone Encounter (Signed)
Just left message with number to [facility]- referral was put in back in May

## 2019-08-05 ENCOUNTER — Other Ambulatory Visit: Payer: Self-pay

## 2019-08-11 ENCOUNTER — Telehealth: Payer: Self-pay

## 2019-08-11 NOTE — Telephone Encounter (Signed)
Patient LVM requesting call back to inquire on his medications taking prior to his colonoscopy. Returned patients call LVM advising patient to hold all medications the morning of procedure.  Stop taking Turmeric, Multivitamins, Fish Oil, Vitamin E, Vitamin D any other vitamins and supplements should be stopped 5 days prior to colonoscopy.  Asked him to call the office back if this did not answer his medication question.  Provided my direct phone number for him to call me back at.  Thanks,  [place], [place]

## 2019-08-13 ENCOUNTER — Other Ambulatory Visit: Payer: Self-pay

## 2019-08-13 ENCOUNTER — Encounter: Payer: Self-pay | Admitting: Gastroenterology

## 2019-08-19 ENCOUNTER — Other Ambulatory Visit: Payer: Self-pay

## 2019-08-19 ENCOUNTER — Other Ambulatory Visit
Admission: RE | Admit: 2019-08-19 | Discharge: 2019-08-19 | Disposition: A | Discharge status: Home / Self Care | Payer: 59 | Source: Ambulatory Visit | Attending: Gastroenterology | Admitting: Gastroenterology

## 2019-08-19 DIAGNOSIS — Z20822 Contact with and (suspected) exposure to covid-19: Secondary | ICD-10-CM | POA: Insufficient documentation

## 2019-08-19 DIAGNOSIS — Z01812 Encounter for preprocedural laboratory examination: Secondary | ICD-10-CM | POA: Insufficient documentation

## 2019-08-19 LAB — SARS CORONAVIRUS 2 (TAT 6-24 HRS): SARS Coronavirus 2: NEGATIVE

## 2019-08-20 NOTE — Discharge Instructions (Signed)
General Anesthesia, Adult, Care After This sheet gives you information about how to care for yourself after your procedure. Your health care provider may also give you more specific instructions. If you have problems or questions, contact your health care provider. What can I expect after the procedure? After the procedure, the following side effects are common:  Pain or discomfort at the IV site.  Nausea.  Vomiting.  Sore throat.  Trouble concentrating.  Feeling cold or chills.  Weak or tired.  Sleepiness and fatigue.  Soreness and body aches. These side effects can affect parts of the body that were not involved in surgery. Follow these instructions at home:  For at least 24 hours after the procedure:  Have a responsible adult stay with you. It is important to have someone help care for you until you are awake and alert.  Rest as needed.  Do not: ? Participate in activities in which you could fall or become injured. ? Drive. ? Use heavy machinery. ? Drink alcohol. ? Take sleeping pills or medicines that cause drowsiness. ? Make important decisions or sign legal documents. ? Take care of children on your own. Eating and drinking  Follow any instructions from your health care provider about eating or drinking restrictions.  When you feel hungry, start by eating small amounts of foods that are soft and easy to digest (bland), such as toast. Gradually return to your regular diet.  Drink enough fluid to keep your urine pale yellow.  If you vomit, rehydrate by drinking water, juice, or clear broth. General instructions  If you have sleep apnea, surgery and certain medicines can increase your risk for breathing problems. Follow instructions from your health care provider about wearing your sleep device: ? Anytime you are sleeping, including during daytime naps. ? While taking prescription pain medicines, sleeping medicines, or medicines that make you drowsy.  Return to  your normal activities as told by your health care provider. Ask your health care provider what activities are safe for you.  Take over-the-counter and prescription medicines only as told by your health care provider.  If you smoke, do not smoke without supervision.  Keep all follow-up visits as told by your health care provider. This is important. Contact a health care provider if:  You have nausea or vomiting that does not get better with medicine.  You cannot eat or drink without vomiting.  You have pain that does not get better with medicine.  You are unable to pass urine.  You develop a skin rash.  You have a fever.  You have redness around your IV site that gets worse. Get help right away if:  You have difficulty breathing.  You have chest pain.  You have blood in your urine or stool, or you vomit blood. Summary  After the procedure, it is common to have a sore throat or nausea. It is also common to feel tired.  Have a responsible adult stay with you for the first 24 hours after general anesthesia. It is important to have someone help care for you until you are awake and alert.  When you feel hungry, start by eating small amounts of foods that are soft and easy to digest (bland), such as toast. Gradually return to your regular diet.  Drink enough fluid to keep your urine pale yellow.  Return to your normal activities as told by your health care provider. Ask your health care provider what activities are safe for you. This information is not   intended to replace advice given to you by your health care provider. Make sure you discuss any questions you have with your health care provider. Document Revised: 02/01/2017 Document Reviewed: 09/14/2016 Elsevier Patient Education  2020 [employer]  

## 2019-08-21 ENCOUNTER — Other Ambulatory Visit: Payer: Self-pay | Admitting: Gastroenterology

## 2019-08-21 ENCOUNTER — Ambulatory Visit: Payer: 59 | Admitting: Anesthesiology

## 2019-08-21 ENCOUNTER — Encounter: Payer: Self-pay | Admitting: Gastroenterology

## 2019-08-21 ENCOUNTER — Ambulatory Visit
Admission: RE | Admit: 2019-08-21 | Discharge: 2019-08-21 | Disposition: A | Discharge status: Home / Self Care | Payer: 59 | Attending: Gastroenterology | Admitting: Gastroenterology

## 2019-08-21 ENCOUNTER — Other Ambulatory Visit: Payer: Self-pay

## 2019-08-21 ENCOUNTER — Encounter
Admission: RE | Disposition: A | Discharge status: Home / Self Care | Payer: Self-pay | Source: Home / Self Care | Attending: Gastroenterology

## 2019-08-21 DIAGNOSIS — Z79899 Other long term (current) drug therapy: Secondary | ICD-10-CM | POA: Insufficient documentation

## 2019-08-21 DIAGNOSIS — Z1211 Encounter for screening for malignant neoplasm of colon: Secondary | ICD-10-CM | POA: Diagnosis not present

## 2019-08-21 DIAGNOSIS — K219 Gastro-esophageal reflux disease without esophagitis: Secondary | ICD-10-CM | POA: Diagnosis not present

## 2019-08-21 DIAGNOSIS — Z6834 Body mass index (BMI) 34.0-34.9, adult: Secondary | ICD-10-CM | POA: Insufficient documentation

## 2019-08-21 DIAGNOSIS — Z87891 Personal history of nicotine dependence: Secondary | ICD-10-CM | POA: Diagnosis not present

## 2019-08-21 DIAGNOSIS — E669 Obesity, unspecified: Secondary | ICD-10-CM | POA: Diagnosis not present

## 2019-08-21 DIAGNOSIS — R12 Heartburn: Secondary | ICD-10-CM

## 2019-08-21 DIAGNOSIS — M069 Rheumatoid arthritis, unspecified: Secondary | ICD-10-CM | POA: Insufficient documentation

## 2019-08-21 DIAGNOSIS — D120 Benign neoplasm of cecum: Secondary | ICD-10-CM | POA: Diagnosis not present

## 2019-08-21 DIAGNOSIS — D124 Benign neoplasm of descending colon: Secondary | ICD-10-CM | POA: Diagnosis not present

## 2019-08-21 DIAGNOSIS — K635 Polyp of colon: Secondary | ICD-10-CM

## 2019-08-21 DIAGNOSIS — B3781 Candidal esophagitis: Secondary | ICD-10-CM | POA: Diagnosis not present

## 2019-08-21 DIAGNOSIS — I10 Essential (primary) hypertension: Secondary | ICD-10-CM | POA: Diagnosis not present

## 2019-08-21 HISTORY — PX: POLYPECTOMY: SHX5525

## 2019-08-21 HISTORY — DX: Unspecified osteoarthritis, unspecified site: M19.90

## 2019-08-21 HISTORY — PX: ESOPHAGOGASTRODUODENOSCOPY (EGD) WITH PROPOFOL: SHX5813

## 2019-08-21 HISTORY — PX: COLONOSCOPY WITH PROPOFOL: SHX5780

## 2019-08-21 LAB — HM COLONOSCOPY

## 2019-08-21 SURGERY — COLONOSCOPY WITH PROPOFOL
Anesthesia: General | Site: Rectum

## 2019-08-21 MED ORDER — LACTATED RINGERS IV SOLN: INTRAVENOUS | Status: DC

## 2019-08-21 MED ORDER — PROPOFOL 10 MG/ML IV BOLUS
INTRAVENOUS | Status: DC | PRN
  Administered 2019-08-21: 20 mg via INTRAVENOUS
  Administered 2019-08-21: 30 mg via INTRAVENOUS
  Administered 2019-08-21 (×2): 20 mg via INTRAVENOUS
  Administered 2019-08-21: 30 mg via INTRAVENOUS
  Administered 2019-08-21: 20 mg via INTRAVENOUS
  Administered 2019-08-21: 30 mg via INTRAVENOUS
  Administered 2019-08-21 (×3): 20 mg via INTRAVENOUS
  Administered 2019-08-21 (×3): 30 mg via INTRAVENOUS
  Administered 2019-08-21: 80 mg via INTRAVENOUS
  Administered 2019-08-21 (×2): 20 mg via INTRAVENOUS

## 2019-08-21 MED ORDER — SODIUM CHLORIDE 0.9 % IV SOLN: INTRAVENOUS | Status: DC

## 2019-08-21 MED ORDER — STERILE WATER FOR IRRIGATION IR SOLN
Status: DC | PRN
  Administered 2019-08-21: .05 mL

## 2019-08-21 MED ORDER — LIDOCAINE HCL (CARDIAC) PF 100 MG/5ML IV SOSY
PREFILLED_SYRINGE | INTRAVENOUS | Status: DC | PRN
  Administered 2019-08-21: 40 mg via INTRAVENOUS

## 2019-08-21 MED ORDER — GLYCOPYRROLATE 0.2 MG/ML IJ SOLN
INTRAMUSCULAR | Status: DC | PRN
  Administered 2019-08-21: .1 mg via INTRAVENOUS

## 2019-08-21 SURGICAL SUPPLY — 8 items
BLOCK BITE 60FR ADLT L/F GRN (MISCELLANEOUS) ×4 IMPLANT
BRUSH CYTO GASTROSCOPE 3.0 (MISCELLANEOUS) ×4 IMPLANT
FORCEPS BIOP RAD 4 LRG CAP 4 (CUTTING FORCEPS) ×4 IMPLANT
GOWN CVR UNV OPN BCK APRN NK (MISCELLANEOUS) ×6 IMPLANT
GOWN ISOL THUMB LOOP REG UNIV (MISCELLANEOUS) ×2
KIT ENDO PROCEDURE OLY (KITS) ×4 IMPLANT
MANIFOLD NEPTUNE II (INSTRUMENTS) ×4 IMPLANT
WATER STERILE IRR 250ML POUR (IV SOLUTION) ×4 IMPLANT

## 2019-08-21 NOTE — Op Note (Signed)
[facility]astroenter[facility] Patient Name: Tyler Glover Procedure Date: 08/21/2019 7:23 AM MRN: [id number] Account #: [id number] Date of Birth: 03-29-1960 Admit Type: Outpatient Age: 59 Room: [facility] OR ROOM 01 Gender: Male Note Status: Finalized Procedure:             Upper GI endoscopy Indications:           Heartburn Providers:             Lucilla Lame MD, MD Referring MD:          Juline Patch, MD (Referring MD) Medicines:             Propofol per Anesthesia Complications:         No immediate complications. Procedure:             Pre-Anesthesia Assessment:                        - Prior to the procedure, a History and Physical was                         performed, and patient medications and allergies were                         reviewed. The patient's tolerance of previous                         anesthesia was also reviewed. The risks and benefits                         of the procedure and the sedation options and risks                         were discussed with the patient. All questions were                         answered, and informed consent was obtained. Prior                         Anticoagulants: The patient has taken no previous                         anticoagulant or antiplatelet agents. ASA Grade                         Assessment: II - A patient with mild systemic disease.                         After reviewing the risks and benefits, the patient                         was deemed in satisfactory condition to undergo the                         procedure.                        After obtaining informed consent, the endoscope was  passed under direct vision. Throughout the procedure,                         the patient's blood pressure, pulse, and oxygen                         saturations were monitored continuously. The was                         introduced through the mouth, and advanced to the                          second part of duodenum. The upper GI endoscopy was                         accomplished without difficulty. The patient tolerated                         the procedure well. Findings:      Patchy, white plaques were found in the entire esophagus. Cells for       cytology were obtained by brushing.      The stomach was normal.      The examined duodenum was normal. Impression:            - Esophageal plaques were found, suspicious for                         candidiasis. Cells for cytology obtained.                        - Normal stomach.                        - Normal examined duodenum. Recommendation:        - Discharge patient to home.                        - Resume previous diet.                        - Continue present medications.                        - Perform a colonoscopy today. Procedure Code(s):     --- Professional ---                        [phone], Esophagogastroduodenoscopy, flexible,                         transoral; diagnostic, including collection of                         specimen(s) by brushing or washing, when performed                         (separate procedure) Diagnosis Code(s):     --- Professional ---                        R12, Heartburn CPT copyright 2019 American Medical Association. All rights reserved. The codes  documented in this report are preliminary and upon coder review may  be revised to meet current compliance requirements. Lucilla Lame MD, MD 08/21/2019 8:03:39 AM This report has been signed electronically. Number of Addenda: 0 Note Initiated On: 08/21/2019 7:23 AM Total Procedure Duration: 0 hours 5 minutes 55 seconds  Estimated Blood Loss:  Estimated blood loss: none.      [facility]

## 2019-08-21 NOTE — Transfer of Care (Signed)
Immediate Anesthesia Transfer of Care Note  Patient: Tyler Glover  Procedure(s) Performed: COLONOSCOPY WITH PROPOFOL (N/A Rectum) ESOPHAGOGASTRODUODENOSCOPY (EGD) WITH PROPOFOL (N/A Mouth) POLYPECTOMY (Rectum)  Patient Location: PACU  Anesthesia Type: General  Level of Consciousness: awake, alert  and patient cooperative  Airway and Oxygen Therapy: Patient Spontanous Breathing and Patient connected to supplemental oxygen  Post-op Assessment: Post-op Vital signs reviewed, Patient's Cardiovascular Status Stable, Respiratory Function Stable, Patent Airway and No signs of Nausea or vomiting  Post-op Vital Signs: Reviewed and stable  Complications: No complications documented.

## 2019-08-21 NOTE — H&P (Signed)
Lucilla Lame, MD [address] [place], [place] [address] Phone:[phone] Fax : [phone]  Primary Care Physician:  Juline Patch, MD Primary Gastroenterologist:  Dr. Allen Norris  Pre-Procedure History & Physical: HPI:  Tyler Glover is a 59 y.o. male is here for an endoscopy and colonoscopy.   Past Medical History:  Diagnosis Date  . Allergy   . Arthritis    Rheumatoid  . GERD (gastroesophageal reflux disease)   . Hypertension     Past Surgical History:  Procedure Laterality Date  . COLONOSCOPY    . SHOULDER SURGERY    . TESTICLE SURGERY     age 45 or 44  . TONSILLECTOMY      Prior to Admission medications   Medication Sig Start Date End Date Taking? Authorizing Provider  acetaminophen (TYLENOL) 325 MG tablet Take by mouth.   Yes [provider]  Adalimumab 40 MG/0.4ML PNKT Inject into the skin. Dr Meda Coffee 01/28/19  Yes [provider]  calcium carbonate (OS-CAL) 600 MG tablet Take 600 mg by mouth daily.   Yes [provider]  Cholecalciferol (VITAMIN D) 2000 units CAPS Take 1 capsule (2,000 Units total) by mouth daily. 07/08/15  Yes Plonk, Gwyndolyn Saxon, MD  Cyanocobalamin 1000 MCG CAPS Take 1 capsule by mouth daily.   Yes [provider]  famotidine (PEPCID) 40 MG tablet Take 1 tablet (40 mg total) by mouth daily. 06/30/19  Yes Juline Patch, MD  fluticasone (FLONASE) 50 MCG/ACT nasal spray USE 1 SPRAY IN EACH NOSTRIL DAILY 11/24/18  Yes Juline Patch, MD  folic acid (FOLVITE) 1 MG tablet Take 1 tablet by mouth daily. Meda Coffee 09/19/18  Yes [provider]  hydroxychloroquine (PLAQUENIL) 200 MG tablet Take 1 tablet by mouth 2 (two) times daily. Dr Meda Coffee 11/14/18  Yes [provider]  ibuprofen (ADVIL) 200 MG tablet Take 1 tablet by mouth as needed.   Yes [provider]  lisinopril (ZESTRIL) 40 MG tablet Take 1 tablet (40 mg total) by mouth daily. 06/30/19  Yes Juline Patch, MD  methotrexate  (RHEUMATREX) 2.5 MG tablet Take 8 tablets by mouth once a week. Meda Coffee 11/13/18  Yes [provider]  Multiple Vitamin (MULTIVITAMIN) capsule Take 1 capsule by mouth daily.   Yes [provider]  OVER THE COUNTER MEDICATION daily. Total Brain Dietary Supplement   Yes [provider]  pantoprazole (PROTONIX) 40 MG tablet Take 40 mg by mouth daily as needed.   Yes [provider]  pseudoephedrine (WAL-PHED 12 HOUR) 120 MG 12 hr tablet TAKE 1 TABLET BY MOUTH EVERY 12 HOURS AS NEEDED FOR CONGESTION 01/05/19  Yes Juline Patch, MD  tadalafil (CIALIS) 20 MG tablet Take 0.5-1 tablets (10-20 mg total) by mouth every other day as needed for erectile dysfunction. 11/24/18  Yes Juline Patch, MD  Testosterone (ANDROGEL PUMP) 20.25 MG/ACT (1.62%) GEL Place 1 Pump onto the skin daily. Apply 1 pump to the forearm daily 11/24/18  Yes Juline Patch, MD  Turmeric (RA TURMERIC EXTRA STRENGTH) 1053 MG TABS Take by mouth.   Yes [provider]  vitamin E (VITAMIN E) 400 UNIT capsule Take 400 Units by mouth daily.   Yes [provider]  diclofenac sodium (VOLTAREN) 1 % GEL Apply topically. otc Patient not taking: Reported on 08/13/2019    [provider]  loratadine (CLARITIN) 10 MG tablet Take 1 tablet (10 mg total) by mouth daily. Patient not taking: Reported on 08/13/2019 11/24/18  Juline Patch, MD  predniSONE (DELTASONE) 5 MG tablet Take 1 tablet by mouth daily. [place] Patient not taking: Reported on 08/13/2019 11/10/18   [provider]    Allergies as of 07/06/2019 - Review Complete 07/06/2019  Allergen Reaction Noted  . Penicillins  09/25/2013    Family History  Family history unknown: Yes    Social History   Socioeconomic History  . Marital status: Married    Spouse name: Not on file  . Number of children: Not on file  . Years of education: Not on file  . Highest education level: Not on file  Occupational History  . Not on  file  Tobacco Use  . Smoking status: Former Smoker    Types: Cigarettes    Quit date: 1993    Years since quitting: 28.5  . Smokeless tobacco: Never Used  Vaping Use  . Vaping Use: Never used  Substance and Sexual Activity  . Alcohol use: Not Currently    Alcohol/week: 0.0 standard drinks  . Drug use: No  . Sexual activity: Not on file  Other Topics Concern  . Not on file  Social History Narrative  . Not on file   Social Determinants of Health   Financial Resource Strain:   . Difficulty of Paying Living Expenses:   Food Insecurity:   . Worried About Charity fundraiser in the Last Year:   . Arboriculturist in the Last Year:   Transportation Needs:   . Film/video editor (Medical):   Marland Kitchen Lack of Transportation (Non-Medical):   Physical Activity:   . Days of Exercise per Week:   . Minutes of Exercise per Session:   Stress:   . Feeling of Stress :   Social Connections:   . Frequency of Communication with Friends and Family:   . Frequency of Social Gatherings with Friends and Family:   . Attends Religious Services:   . Active Member of Clubs or Organizations:   . Attends Archivist Meetings:   Marland Kitchen Marital Status:   Intimate Partner Violence:   . Fear of Current or Ex-Partner:   . Emotionally Abused:   Marland Kitchen Physically Abused:   . Sexually Abused:     Review of Systems: See HPI, otherwise negative ROS  Physical Exam: BP 133/87 (BP Location: Right Arm)   Pulse 91   Temp 98.1 F (36.7 C) (Temporal)   Ht 5\' 11"  (1.803 m)   Wt 112.5 kg   SpO2 99%   BMI 34.59 kg/m  General:   Alert,  pleasant and cooperative in NAD Head:  Normocephalic and atraumatic. Neck:  Supple; no masses or thyromegaly. Lungs:  Clear throughout to auscultation.    Heart:  Regular rate and rhythm. Abdomen:  Soft, nontender and nondistended. Normal bowel sounds, without guarding, and without rebound.   Neurologic:  Alert and  oriented x4;  grossly normal  neurologically.  Impression/Plan: Tyler Glover is here for an endoscopy and colonoscopy to be performed for screening and gerd  Risks, benefits, limitations, and alternatives regarding  endoscopy and colonoscopy have been reviewed with the patient.  Questions have been answered.  All parties agreeable.   Lucilla Lame, MD  08/21/2019, 7:33 AM

## 2019-08-21 NOTE — Op Note (Signed)
[facility] Gastroenterology Patient Name: Tyler Glover Procedure Date: 08/21/2019 7:23 AM MRN: [id number] Account #: [id number] Date of Birth: [birth year] Admit Type: Outpatient Age: 59 Room: [facility] OR ROOM 01 Gender: Male Note Status: Finalized Procedure:             Colonoscopy Indications:           Screening for colorectal malignant neoplasm Providers:             Lucilla Lame MD, MD Medicines:             Propofol per Anesthesia Complications:         No immediate complications. Procedure:             Pre-Anesthesia Assessment:                        - Prior to the procedure, a History and Physical was                         performed, and patient medications and allergies were                         reviewed. The patient's tolerance of previous                         anesthesia was also reviewed. The risks and benefits                         of the procedure and the sedation options and risks                         were discussed with the patient. All questions were                         answered, and informed consent was obtained. Prior                         Anticoagulants: The patient has taken no previous                         anticoagulant or antiplatelet agents. ASA Grade                         Assessment: II - A patient with mild systemic disease.                         After reviewing the risks and benefits, the patient                         was deemed in satisfactory condition to undergo the                         procedure.                        After obtaining informed consent, the colonoscope was                         passed under direct vision. Throughout the procedure,  the patient's blood pressure, pulse, and oxygen                         saturations were monitored continuously. The                         Colonoscope was introduced through the anus and                         advanced to the the cecum,  identified by appendiceal                         orifice and ileocecal valve. The colonoscopy was                         performed without difficulty. The patient tolerated                         the procedure well. The quality of the bowel                         preparation was excellent. Findings:      The perianal and digital rectal examinations were normal.      Two sessile polyps were found in the cecum. The polyps were 1 to 2 mm in       size. These polyps were removed with a cold biopsy forceps. Resection       and retrieval were complete.      A 3 mm polyp was found in the descending colon. The polyp was sessile.       The polyp was removed with a cold biopsy forceps. Resection and       retrieval were complete.      A 2 mm polyp was found in the sigmoid colon. The polyp was sessile. The       polyp was removed with a cold biopsy forceps. Resection and retrieval       were complete. Impression:            - Two 1 to 2 mm polyps in the cecum, removed with a                         cold biopsy forceps. Resected and retrieved.                        - One 3 mm polyp in the descending colon, removed with                         a cold biopsy forceps. Resected and retrieved.                        - One 2 mm polyp in the sigmoid colon, removed with a                         cold biopsy forceps. Resected and retrieved. Recommendation:        - Discharge patient to home.                        - Resume previous diet.                        -  Continue present medications.                        - Await pathology results.                        - Repeat colonoscopy in 5 years if polyp adenoma and                         10 years if hyperplastic Procedure Code(s):     --- Professional ---                        [phone], Colonoscopy, flexible; with biopsy, single or                         multiple Diagnosis Code(s):     --- Professional ---                        Z12.11, Encounter for  screening for malignant neoplasm                         of colon                        K63.5, Polyp of colon CPT copyright 2019 American Medical Association. All rights reserved. The codes documented in this report are preliminary and upon coder review may  be revised to meet current compliance requirements. Lucilla Lame MD, MD 08/21/2019 8:20:26 AM This report has been signed electronically. Number of Addenda: 0 Note Initiated On: 08/21/2019 7:23 AM Scope Withdrawal Time: 0 hours 10 minutes 7 seconds  Total Procedure Duration: 0 hours 13 minutes 58 seconds  Estimated Blood Loss:  Estimated blood loss: none.      [facility]

## 2019-08-21 NOTE — Anesthesia Preprocedure Evaluation (Signed)
Anesthesia Evaluation  Patient identified by MRN, date of birth, ID band Patient awake    Reviewed: Allergy & Precautions, NPO status , Patient's Chart, lab work & pertinent test results  Airway Mallampati: II  TM Distance: >3 FB Neck ROM: Full    Dental no notable dental hx.    Pulmonary former smoker,    Pulmonary exam normal        Cardiovascular hypertension, Normal cardiovascular exam     Neuro/Psych negative neurological ROS  negative psych ROS   GI/Hepatic GERD  Controlled,  Endo/Other  Obesity  Renal/GU negative Renal ROS  negative genitourinary   Musculoskeletal  (+) Arthritis ,   Abdominal (+) + obese,   Peds  Hematology negative hematology ROS (+)   Anesthesia Other Findings   Reproductive/Obstetrics                             Anesthesia Physical Anesthesia Plan  ASA: II  Anesthesia Plan: General   Post-op Pain Management:    Induction: Intravenous  PONV Risk Score and Plan: 2 and Propofol infusion, TIVA, Midazolam and Treatment may vary due to age or medical condition  Airway Management Planned: Natural Airway  Additional Equipment:   Intra-op Plan:   Post-operative Plan:   Informed Consent: I have reviewed the patients History and Physical, chart, labs and discussed the procedure including the risks, benefits and alternatives for the proposed anesthesia with the patient or authorized representative who has indicated his/her understanding and acceptance.       Plan Discussed with: CRNA  Anesthesia Plan Comments:         Anesthesia Quick Evaluation

## 2019-08-21 NOTE — Anesthesia Procedure Notes (Signed)
Performed by: Denny Mccree, CRNA Pre-anesthesia Checklist: Patient identified, Emergency Drugs available, Suction available, Timeout performed and Patient being monitored Patient Re-evaluated:Patient Re-evaluated prior to induction Oxygen Delivery Method: Nasal cannula Placement Confirmation: positive ETCO2       

## 2019-08-21 NOTE — Anesthesia Postprocedure Evaluation (Signed)
Anesthesia Post Note  Patient: Tyler Glover  Procedure(s) Performed: COLONOSCOPY WITH PROPOFOL (N/A Rectum) ESOPHAGOGASTRODUODENOSCOPY (EGD) WITH PROPOFOL (N/A Mouth) POLYPECTOMY (Rectum)     Anesthesia Post Evaluation No complications documented.  Jacqulin Brandenburger [employer]

## 2019-08-24 ENCOUNTER — Encounter: Payer: Self-pay | Admitting: Family Medicine

## 2019-08-24 ENCOUNTER — Encounter: Payer: Self-pay | Admitting: Gastroenterology

## 2019-08-24 LAB — SURGICAL PATHOLOGY

## 2019-08-24 LAB — CYTOLOGY - NON PAP

## 2019-08-25 ENCOUNTER — Telehealth: Payer: Self-pay

## 2019-08-25 ENCOUNTER — Other Ambulatory Visit: Payer: Self-pay

## 2019-08-25 LAB — PATHOLOGY

## 2019-08-25 MED ORDER — FLUCONAZOLE 100 MG PO TABS
100.0000 mg | ORAL_TABLET | Freq: Every day | ORAL | 0 refills | Status: DC
Start: 2019-08-25 — End: 2019-09-03

## 2019-08-25 NOTE — Telephone Encounter (Signed)
Pt notified of EGD results. Rx for Diflucan has been sent to pt's pharmacy.

## 2019-08-25 NOTE — Telephone Encounter (Signed)
-----   Message from Lucilla Lame, MD sent at 08/24/2019  1:47 PM EDT ----- Let the patient know that his brushings from the esophagus were compatible with a yeast infection.  He should be started on Diflucan 100 mg a day for 2 weeks if he is not already on it.

## 2019-09-03 ENCOUNTER — Ambulatory Visit (INDEPENDENT_AMBULATORY_CARE_PROVIDER_SITE_OTHER): Payer: 59 | Admitting: Family Medicine

## 2019-09-03 ENCOUNTER — Encounter: Payer: Self-pay | Admitting: Family Medicine

## 2019-09-03 ENCOUNTER — Other Ambulatory Visit: Payer: Self-pay

## 2019-09-03 DIAGNOSIS — I10 Essential (primary) hypertension: Secondary | ICD-10-CM | POA: Diagnosis not present

## 2019-09-03 DIAGNOSIS — J301 Allergic rhinitis due to pollen: Secondary | ICD-10-CM | POA: Diagnosis not present

## 2019-09-03 DIAGNOSIS — R7989 Other specified abnormal findings of blood chemistry: Secondary | ICD-10-CM | POA: Diagnosis not present

## 2019-09-03 DIAGNOSIS — N529 Male erectile dysfunction, unspecified: Secondary | ICD-10-CM

## 2019-09-03 MED ORDER — PSEUDOEPHEDRINE HCL ER 120 MG PO TB12: ORAL_TABLET | ORAL | 5 refills | Status: DC

## 2019-09-03 MED ORDER — TADALAFIL 20 MG PO TABS: 10.0000 mg | ORAL_TABLET | ORAL | 5 refills | Status: DC | PRN

## 2019-09-03 MED ORDER — PANTOPRAZOLE SODIUM 40 MG PO TBEC: 40.0000 mg | DELAYED_RELEASE_TABLET | Freq: Every day | ORAL | 1 refills | Status: DC | PRN

## 2019-09-03 MED ORDER — TESTOSTERONE 20.25 MG/ACT (1.62%) TD GEL: 1.0000 | Freq: Every day | TRANSDERMAL | 5 refills | Status: DC

## 2019-09-03 MED ORDER — LORATADINE 10 MG PO TABS: 10.0000 mg | ORAL_TABLET | Freq: Every day | ORAL | 1 refills | Status: DC

## 2019-09-03 MED ORDER — LISINOPRIL 40 MG PO TABS: 40.0000 mg | ORAL_TABLET | Freq: Every day | ORAL | 1 refills | Status: DC

## 2019-09-03 MED ORDER — FLUTICASONE PROPIONATE 50 MCG/ACT NA SUSP: NASAL | 11 refills | Status: DC

## 2019-09-03 NOTE — Progress Notes (Signed)
Date:  09/03/2019   Name:  Tyler Glover   DOB:  [birth year]   MRN:  [id number]   Chief Complaint: Allergic Rhinitis , Hypertension, Erectile Dysfunction, hypotestosterone, and Gastroesophageal Reflux  Hypertension This is a chronic problem. The current episode started more than 1 year ago. The problem has been gradually improving since onset. The problem is controlled. Pertinent negatives include no anxiety, blurred vision, chest pain, headaches, malaise/fatigue, neck pain, orthopnea, palpitations, peripheral edema, PND, shortness of breath or sweats. There are no associated agents to hypertension. There are no known risk factors for coronary artery disease. Past treatments include ACE inhibitors. The current treatment provides moderate improvement. There are no compliance problems.  There is no history of angina, kidney disease, CAD/MI, CVA, heart failure, left ventricular hypertrophy, PVD or retinopathy. There is no history of chronic renal disease, a hypertension causing med or renovascular disease.  Erectile Dysfunction This is a chronic problem. The problem has been waxing and waning since onset. The nature of his difficulty is achieving erection. He reports no anxiety, decreased libido or performance anxiety. Irritative symptoms do not include frequency or urgency. Pertinent negatives include no chills, dysuria or hematuria. Past treatments include tadalafil. The treatment provided moderate relief. He has been using treatment for 1 to 2 years. He has had no adverse reactions caused by medications.  Gastroesophageal Reflux He complains of heartburn. He reports no abdominal pain, no belching, no chest pain, no choking, no coughing, no dysphagia, no early satiety, no globus sensation, no hoarse voice, no nausea, no sore throat, no stridor or no wheezing. This is a chronic problem. The current episode started more than 1 year ago. The problem has been gradually improving. The heartburn is of  moderate intensity. The symptoms are aggravated by certain foods. Pertinent negatives include no fatigue or weight loss. He has tried a PPI for the symptoms. The treatment provided mild relief.  URI  This is a chronic (for allergic rhinitis) problem. The current episode started more than 1 year ago. The problem has been waxing and waning. There has been no fever. Associated symptoms include congestion and rhinorrhea. Pertinent negatives include no abdominal pain, chest pain, coughing, diarrhea, dysuria, ear pain, headaches, joint pain, joint swelling, nausea, neck pain, rash, sinus pain, sneezing, sore throat, swollen glands or wheezing. He has tried antihistamine for the symptoms.  Diabetes He presents for his follow-up diabetic visit. He has type 2 diabetes mellitus. His disease course has been stable. Pertinent negatives for hypoglycemia include no dizziness, headaches, nervousness/anxiousness or sweats. Pertinent negatives for diabetes include no blurred vision, no chest pain, no fatigue, no foot paresthesias, no foot ulcerations, no polydipsia, no polyphagia, no polyuria, no visual change, no weakness and no weight loss. Symptoms are improving. Pertinent negatives for diabetic complications include no CVA, PVD or retinopathy.    Lab Results  Component Value Date   CREATININE 1.12 06/30/2019   BUN 13 06/30/2019   NA 139 06/30/2019   K 4.5 06/30/2019   CL 105 06/30/2019   CO2 20 06/30/2019   Lab Results  Component Value Date   CHOL 192 06/30/2019   HDL 47 06/30/2019   LDLCALC 112 (H) 06/30/2019   TRIG 192 (H) 06/30/2019   CHOLHDL 4.6 08/22/2017   No results found for: TSH Lab Results  Component Value Date   HGBA1C 5.3 06/30/2019   No results found for: WBC, HGB, HCT, MCV, PLT Lab Results  Component Value Date   ALT 26 06/30/2019  AST 25 06/30/2019   ALKPHOS 66 06/30/2019   BILITOT 0.6 06/30/2019     Review of Systems  Constitutional: Negative for chills, fatigue, fever,  malaise/fatigue and weight loss.  HENT: Positive for congestion and rhinorrhea. Negative for drooling, ear discharge, ear pain, hoarse voice, sinus pain, sneezing and sore throat.   Eyes: Negative for blurred vision.  Respiratory: Negative for cough, choking, shortness of breath and wheezing.   Cardiovascular: Negative for chest pain, palpitations, orthopnea, leg swelling and PND.  Gastrointestinal: Positive for heartburn. Negative for abdominal pain, blood in stool, constipation, diarrhea, dysphagia and nausea.  Endocrine: Negative for polydipsia, polyphagia and polyuria.  Genitourinary: Negative for decreased libido, dysuria, frequency, hematuria and urgency.  Musculoskeletal: Negative for back pain, joint pain, myalgias and neck pain.  Skin: Negative for rash.  Allergic/Immunologic: Negative for environmental allergies.  Neurological: Negative for dizziness, weakness and headaches.  Hematological: Does not bruise/bleed easily.  Psychiatric/Behavioral: Negative for suicidal ideas. The patient is not nervous/anxious.     Patient Active Problem List   Diagnosis Date Noted  . Encounter for screening colonoscopy   . Polyp of transverse colon   . Heartburn   . Pure hypercholesterolemia 01/26/2016  . Right lateral epicondylitis 07/07/2015  . Vitamin D deficiency 12/24/2014  . Prediabetes 12/24/2014  . Allergic rhinitis, seasonal 06/03/2014  . Acid reflux 06/03/2014  . Essential (primary) hypertension 06/03/2014  . Family history of diabetes mellitus 06/03/2014  . Family history of cardiac disorder 06/03/2014  . Obesity (BMI 30.0-34.9) 06/03/2014    Allergies  Allergen Reactions  . Penicillins     Told as child not to do injectable, but has taken Amoxicillin since Unknown    Past Surgical History:  Procedure Laterality Date  . COLONOSCOPY    . COLONOSCOPY WITH PROPOFOL N/A 08/21/2019   Procedure: COLONOSCOPY WITH PROPOFOL;  Surgeon: Lucilla Lame, MD;  Location: [facility];  Service: Endoscopy;  Laterality: N/A;  priority 4  . ESOPHAGOGASTRODUODENOSCOPY (EGD) WITH PROPOFOL N/A 08/21/2019   Procedure: ESOPHAGOGASTRODUODENOSCOPY (EGD) WITH PROPOFOL;  Surgeon: Lucilla Lame, MD;  Location: [facility];  Service: Endoscopy;  Laterality: N/A;  . POLYPECTOMY  08/21/2019   Procedure: POLYPECTOMY;  Surgeon: Lucilla Lame, MD;  Location: [facility];  Service: Endoscopy;;  . SHOULDER SURGERY    . TESTICLE SURGERY     age 13 or 41  . TONSILLECTOMY      Social History   Tobacco Use  . Smoking status: Former Smoker    Types: Cigarettes    Quit date: 1993    Years since quitting: 28.5  . Smokeless tobacco: Never Used  Vaping Use  . Vaping Use: Never used  Substance Use Topics  . Alcohol use: Not Currently    Alcohol/week: 0.0 standard drinks  . Drug use: No     Medication list has been reviewed and updated.  Current Meds  Medication Sig  . acetaminophen (TYLENOL) 325 MG tablet Take by mouth.  . Adalimumab 40 MG/0.4ML PNKT Inject into the skin. Dr Meda Coffee  . calcium carbonate (OS-CAL) 600 MG tablet Take 600 mg by mouth daily.  . Cholecalciferol (VITAMIN D) 2000 units CAPS Take 1 capsule (2,000 Units total) by mouth daily.  . Cyanocobalamin 1000 MCG CAPS Take 1 capsule by mouth daily.  . fluticasone (FLONASE) 50 MCG/ACT nasal spray USE 1 SPRAY IN EACH NOSTRIL DAILY  . folic acid (FOLVITE) 1 MG tablet Take 1 tablet by mouth daily. [place]  . hydroxychloroquine (PLAQUENIL) 200 MG tablet  Take 1 tablet by mouth 2 (two) times daily. Dr Meda Coffee  . ibuprofen (ADVIL) 200 MG tablet Take 1 tablet by mouth as needed.  Marland Kitchen lisinopril (ZESTRIL) 40 MG tablet Take 1 tablet (40 mg total) by mouth daily.  Marland Kitchen loratadine (CLARITIN) 10 MG tablet Take 1 tablet (10 mg total) by mouth daily.  . methotrexate (RHEUMATREX) 2.5 MG tablet Take 8 tablets by mouth once a week. Meda Coffee  . Multiple Vitamin (MULTIVITAMIN) capsule Take 1 capsule by mouth daily.  Marland Kitchen OVER THE COUNTER  MEDICATION daily. Total Brain Dietary Supplement  . pantoprazole (PROTONIX) 40 MG tablet Take 40 mg by mouth daily as needed.  . pseudoephedrine (WAL-PHED 12 HOUR) 120 MG 12 hr tablet TAKE 1 TABLET BY MOUTH EVERY 12 HOURS AS NEEDED FOR CONGESTION  . tadalafil (CIALIS) 20 MG tablet Take 0.5-1 tablets (10-20 mg total) by mouth every other day as needed for erectile dysfunction.  . Testosterone (ANDROGEL PUMP) 20.25 MG/ACT (1.62%) GEL Place 1 Pump onto the skin daily. Apply 1 pump to the forearm daily  . Turmeric (RA TURMERIC EXTRA STRENGTH) 1053 MG TABS Take by mouth.  . vitamin E (VITAMIN E) 400 UNIT capsule Take 400 Units by mouth daily.  . [DISCONTINUED] famotidine (PEPCID) 40 MG tablet Take 1 tablet (40 mg total) by mouth daily.  . [DISCONTINUED] fluconazole (DIFLUCAN) 100 MG tablet Take 1 tablet (100 mg total) by mouth daily for 14 days.    PHQ 2/9 Scores 09/03/2019 06/30/2019 06/03/2018 08/22/2017  PHQ - 2 Score 0 0 0 0  PHQ- 9 Score 0 0 0 -    GAD 7 : Generalized Anxiety Score 06/30/2019  Nervous, Anxious, on Edge 0  Control/stop worrying 0  Worry too much - different things 0  Trouble relaxing 0  Restless 0  Easily annoyed or irritable 0  Afraid - awful might happen 0  Total GAD 7 Score 0    BP Readings from Last 3 Encounters:  09/03/19 130/86  08/21/19 115/74  06/30/19 128/64    Physical Exam Vitals and nursing note reviewed.  HENT:     Head: Normocephalic.     Right Ear: Tympanic membrane, ear canal and external ear normal.     Left Ear: Tympanic membrane, ear canal and external ear normal.     Nose: Nose normal. No congestion or rhinorrhea.     Mouth/Throat:     Mouth: Mucous membranes are moist.  Eyes:     General: No scleral icterus.       Right eye: No discharge.        Left eye: No discharge.     Conjunctiva/sclera: Conjunctivae normal.     Pupils: Pupils are equal, round, and reactive to light.  Neck:     Thyroid: No thyromegaly.     Vascular: No JVD.      Trachea: No tracheal deviation.  Cardiovascular:     Rate and Rhythm: Normal rate and regular rhythm.     Heart sounds: Normal heart sounds. No murmur heard.  No friction rub. No gallop.   Pulmonary:     Effort: No respiratory distress.     Breath sounds: Normal breath sounds. No wheezing or rales.  Abdominal:     General: Bowel sounds are normal.     Palpations: Abdomen is soft. There is no mass.     Tenderness: There is no abdominal tenderness. There is no guarding or rebound.  Musculoskeletal:        General: No tenderness. Normal  range of motion.     Cervical back: Normal range of motion and neck supple.  Lymphadenopathy:     Cervical: No cervical adenopathy.  Skin:    General: Skin is warm.     Findings: No rash.  Neurological:     Mental Status: He is alert and oriented to person, place, and time.     Cranial Nerves: No cranial nerve deficit.     Deep Tendon Reflexes: Reflexes are normal and symmetric.     Wt Readings from Last 3 Encounters:  09/03/19 250 lb (113.4 kg)  08/21/19 248 lb (112.5 kg)  06/30/19 247 lb (112 kg)    BP 130/86   Pulse 64   Ht 5\' 11"  (1.803 m)   Wt 250 lb (113.4 kg)   BMI 34.87 kg/m   Assessment and Plan: 1. Seasonal allergic rhinitis due to pollen Chronic.  Controlled.  Stable.  Continue Claritin 10 mg once a day and fluticasone nasal spray 1 spray each nostril daily. - fluticasone (FLONASE) 50 MCG/ACT nasal spray; USE 1 SPRAY IN EACH NOSTRIL DAILY  Dispense: 16 g; Refill: 11 - loratadine (CLARITIN) 10 MG tablet; Take 1 tablet (10 mg total) by mouth daily.  Dispense: 90 tablet; Refill: 1 - pseudoephedrine (WAL-PHED 12 HOUR) 120 MG 12 hr tablet; TAKE 1 TABLET BY MOUTH EVERY 12 HOURS AS NEEDED FOR CONGESTION  Dispense: 30 tablet; Refill: 5  2. Essential (primary) hypertension Chronic.  Controlled.  Stable.  Continue lisinopril 40 mg once a day.  Review of last renal function panel was unremarkable. - lisinopril (ZESTRIL) 40 MG tablet; Take  1 tablet (40 mg total) by mouth daily.  Dispense: 90 tablet; Refill: 1  3. Vasculogenic erectile dysfunction, unspecified vasculogenic erectile dysfunction type Chronic.  Controlled.  Stable.  Continue Cialis 20 mg 1/2 to 1 tablet as needed as needed. - tadalafil (CIALIS) 20 MG tablet; Take 0.5-1 tablets (10-20 mg total) by mouth every other day as needed for erectile dysfunction.  Dispense: 10 tablet; Refill: 5  4. 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  Controlled.  Stable.  Uncomplicated.  Patient is doing well on current dosing of AndroGel 1.62% 1 pump on arm daily. - Testosterone (ANDROGEL PUMP) 20.25 MG/ACT (1.62%) GEL; Place 1 Pump onto the skin daily. Apply 1 pump to the forearm daily  Dispense: 75 g; Refill: 5

## 2019-12-04 ENCOUNTER — Other Ambulatory Visit: Payer: Self-pay

## 2019-12-04 ENCOUNTER — Telehealth: Payer: Self-pay

## 2019-12-04 DIAGNOSIS — J019 Acute sinusitis, unspecified: Secondary | ICD-10-CM

## 2019-12-04 MED ORDER — AZITHROMYCIN 250 MG PO TABS: ORAL_TABLET | ORAL | 0 refills | Status: DC

## 2019-12-04 NOTE — Telephone Encounter (Signed)
Sent in [facility] 

## 2019-12-04 NOTE — Progress Notes (Unsigned)
Sent in [facility]- will see on Monday

## 2019-12-04 NOTE — Telephone Encounter (Unsigned)
Copied from [place] [id number]. Topic: General - Inquiry >> Dec 04, 2019  9:17 AM Greggory Keen D wrote: Reason for CRM:   Pt called asking [name] to call him back.  He would not say why.  He said about his "stuff".

## 2019-12-07 ENCOUNTER — Other Ambulatory Visit: Payer: Self-pay

## 2019-12-07 ENCOUNTER — Encounter: Payer: Self-pay | Admitting: Family Medicine

## 2019-12-07 ENCOUNTER — Ambulatory Visit (INDEPENDENT_AMBULATORY_CARE_PROVIDER_SITE_OTHER): Payer: 59 | Admitting: Family Medicine

## 2019-12-07 VITALS — BP 124/82 | HR 80 | Ht 71.0 in | Wt 250.0 lb

## 2019-12-07 DIAGNOSIS — J019 Acute sinusitis, unspecified: Secondary | ICD-10-CM

## 2019-12-07 DIAGNOSIS — Z23 Encounter for immunization: Secondary | ICD-10-CM | POA: Diagnosis not present

## 2019-12-07 NOTE — Progress Notes (Signed)
Date:  12/07/2019   Name:  Tyler Glover   DOB:  1960/08/28   MRN:  [id number]   Chief Complaint: Sinusitis  Sinusitis This is a new problem. The current episode started in the past 7 days. The problem has been gradually improving since onset. There has been no fever. The pain is mild. Associated symptoms include congestion, coughing, headaches, sinus pressure and sneezing. Pertinent negatives include no chills, diaphoresis, ear pain, hoarse voice, neck pain, shortness of breath, sore throat or swollen glands. Treatments tried: azithromycin. The treatment provided mild relief.    Lab Results  Component Value Date   CREATININE 1.12 06/30/2019   BUN 13 06/30/2019   NA 139 06/30/2019   K 4.5 06/30/2019   CL 105 06/30/2019   CO2 20 06/30/2019   Lab Results  Component Value Date   CHOL 192 06/30/2019   HDL 47 06/30/2019   LDLCALC 112 (H) 06/30/2019   TRIG 192 (H) 06/30/2019   CHOLHDL 4.6 08/22/2017   No results found for: TSH Lab Results  Component Value Date   HGBA1C 5.3 06/30/2019   No results found for: WBC, HGB, HCT, MCV, PLT Lab Results  Component Value Date   ALT 26 06/30/2019   AST 25 06/30/2019   ALKPHOS 66 06/30/2019   BILITOT 0.6 06/30/2019     Review of Systems  Constitutional: Negative for chills, diaphoresis and fever.  HENT: Positive for congestion, sinus pressure and sneezing. Negative for drooling, ear discharge, ear pain, hoarse voice and sore throat.   Respiratory: Positive for cough. Negative for shortness of breath and wheezing.   Cardiovascular: Negative for chest pain, palpitations and leg swelling.  Gastrointestinal: Negative for abdominal pain, blood in stool, constipation, diarrhea and nausea.  Endocrine: Negative for polydipsia.  Genitourinary: Negative for dysuria, frequency, hematuria and urgency.  Musculoskeletal: Negative for back pain, myalgias and neck pain.  Skin: Negative for rash.  Allergic/Immunologic: Negative for  environmental allergies.  Neurological: Positive for headaches. Negative for dizziness.  Hematological: Does not bruise/bleed easily.  Psychiatric/Behavioral: Negative for suicidal ideas. The patient is not nervous/anxious.     Patient Active Problem List   Diagnosis Date Noted  . Encounter for screening colonoscopy   . Polyp of transverse colon   . Heartburn   . Pure hypercholesterolemia 01/26/2016  . Right lateral epicondylitis 07/07/2015  . Vitamin D deficiency 12/24/2014  . Prediabetes 12/24/2014  . Allergic rhinitis, seasonal 06/03/2014  . Acid reflux 06/03/2014  . Essential (primary) hypertension 06/03/2014  . Family history of diabetes mellitus 06/03/2014  . Family history of cardiac disorder 06/03/2014  . Obesity (BMI 30.0-34.9) 06/03/2014    Allergies  Allergen Reactions  . Penicillins     Told as child not to do injectable, but has taken Amoxicillin since Unknown    Past Surgical History:  Procedure Laterality Date  . COLONOSCOPY    . COLONOSCOPY WITH PROPOFOL N/A 08/21/2019   Procedure: COLONOSCOPY WITH PROPOFOL;  Surgeon: Lucilla Lame, MD;  Location: [facility];  Service: Endoscopy;  Laterality: N/A;  priority 4  . ESOPHAGOGASTRODUODENOSCOPY (EGD) WITH PROPOFOL N/A 08/21/2019   Procedure: ESOPHAGOGASTRODUODENOSCOPY (EGD) WITH PROPOFOL;  Surgeon: Lucilla Lame, MD;  Location: [facility];  Service: Endoscopy;  Laterality: N/A;  . POLYPECTOMY  08/21/2019   Procedure: POLYPECTOMY;  Surgeon: Lucilla Lame, MD;  Location: [facility];  Service: Endoscopy;;  . SHOULDER SURGERY    . TESTICLE SURGERY     age 53 or 41  . TONSILLECTOMY  Social History   Tobacco Use  . Smoking status: Former Smoker    Types: Cigarettes    Quit date: 1993    Years since quitting: 28.8  . Smokeless tobacco: Never Used  Vaping Use  . Vaping Use: Never used  Substance Use Topics  . Alcohol use: Not Currently    Alcohol/week: 0.0 standard drinks  . Drug use:  No     Medication list has been reviewed and updated.  Current Meds  Medication Sig  . acetaminophen (TYLENOL) 325 MG tablet Take by mouth.  . Adalimumab 40 MG/0.4ML PNKT Inject into the skin. Dr patel  . azithromycin (ZITHROMAX) 250 MG tablet Take 2 today and 1 each day for 4 days  . calcium carbonate (OS-CAL) 600 MG tablet Take 600 mg by mouth daily.  . Cholecalciferol (VITAMIN D) 2000 units CAPS Take 1 capsule (2,000 Units total) by mouth daily.  . Cyanocobalamin 1000 MCG CAPS Take 1 capsule by mouth daily.  . diclofenac sodium (VOLTAREN) 1 % GEL Apply topically. otc  . fluticasone (FLONASE) 50 MCG/ACT nasal spray USE 1 SPRAY IN EACH NOSTRIL DAILY  . folic acid (FOLVITE) 1 MG tablet Take 1 tablet by mouth daily. patel  . hydroxychloroquine (PLAQUENIL) 200 MG tablet Take 1 tablet by mouth 2 (two) times daily. Dr patel  . ibuprofen (ADVIL) 200 MG tablet Take 1 tablet by mouth as needed.  Marland Kitchen lisinopril (ZESTRIL) 40 MG tablet Take 1 tablet (40 mg total) by mouth daily.  . methotrexate (RHEUMATREX) 2.5 MG tablet Take 9 tablets by mouth once a week. patel  . Multiple Vitamin (MULTIVITAMIN) capsule Take 1 capsule by mouth daily.  Marland Kitchen OVER THE COUNTER MEDICATION daily. Total Brain Dietary Supplement  . pantoprazole (PROTONIX) 40 MG tablet Take 1 tablet (40 mg total) by mouth daily as needed. (Patient taking differently: Take 40 mg by mouth daily. )  . pseudoephedrine (WAL-PHED 12 HOUR) 120 MG 12 hr tablet TAKE 1 TABLET BY MOUTH EVERY 12 HOURS AS NEEDED FOR CONGESTION  . tadalafil (CIALIS) 20 MG tablet Take 0.5-1 tablets (10-20 mg total) by mouth every other day as needed for erectile dysfunction.  . Testosterone (ANDROGEL PUMP) 20.25 MG/ACT (1.62%) GEL Place 1 Pump onto the skin daily. Apply 1 pump to the forearm daily  . Turmeric (RA TURMERIC EXTRA STRENGTH) 1053 MG TABS Take by mouth.  . vitamin E (VITAMIN E) 400 UNIT capsule Take 400 Units by mouth daily.    PHQ 2/9 Scores 09/03/2019  06/30/2019 06/03/2018 08/22/2017  PHQ - 2 Score 0 0 0 0  PHQ- 9 Score 0 0 0 -    GAD 7 : Generalized Anxiety Score 06/30/2019  Nervous, Anxious, on Edge 0  Control/stop worrying 0  Worry too much - different things 0  Trouble relaxing 0  Restless 0  Easily annoyed or irritable 0  Afraid - awful might happen 0  Total GAD 7 Score 0    BP Readings from Last 3 Encounters:  12/07/19 124/82  09/03/19 130/86  08/21/19 115/74    Physical Exam HENT:     Head: Normocephalic.     Right Ear: Tympanic membrane, ear canal and external ear normal.     Left Ear: Tympanic membrane, ear canal and external ear normal.     Nose: Nose normal. No congestion or rhinorrhea.     Mouth/Throat:     Mouth: Mucous membranes are moist.  Eyes:     General: No scleral icterus.  Right eye: No discharge.        Left eye: No discharge.     Conjunctiva/sclera: Conjunctivae normal.     Pupils: Pupils are equal, round, and reactive to light.  Neck:     Thyroid: No thyromegaly.     Vascular: No carotid bruit or JVD.     Trachea: No tracheal deviation.  Cardiovascular:     Rate and Rhythm: Normal rate and regular rhythm.     Heart sounds: Normal heart sounds. No murmur heard.  No friction rub. No gallop.   Pulmonary:     Effort: No respiratory distress.     Breath sounds: Normal breath sounds. No wheezing, rhonchi or rales.  Abdominal:     General: Bowel sounds are normal.     Palpations: Abdomen is soft. There is no mass.     Tenderness: There is no abdominal tenderness. There is no guarding or rebound.  Musculoskeletal:        General: No tenderness. Normal range of motion.     Cervical back: Normal range of motion and neck supple.  Lymphadenopathy:     Cervical: No cervical adenopathy.  Skin:    General: Skin is warm.     Findings: No rash.  Neurological:     Mental Status: He is alert and oriented to person, place, and time.     Cranial Nerves: No cranial nerve deficit.     Deep Tendon  Reflexes: Reflexes are normal and symmetric.     Wt Readings from Last 3 Encounters:  12/07/19 250 lb (113.4 kg)  09/03/19 250 lb (113.4 kg)  08/21/19 248 lb (112.5 kg)    BP 124/82   Pulse 80   Ht 5\' 11"  (1.803 m)   Wt 250 lb (113.4 kg)   BMI 34.87 kg/m   Assessment and Plan: 1. Acute non-recurrent sinusitis, unspecified location Acute.  Controlled.  Gradually improving.  Patient was started on azithromycin on Friday and returns today for evaluation.  Patient has taken last azithromycin and has been encouraged to use Mucinex DM for cough.  2. Need for immunization against influenza Discussed and administered. - Flu Vaccine QUAD 36+ mos IM

## 2019-12-08 ENCOUNTER — Encounter: Payer: Self-pay | Admitting: Family Medicine

## 2020-01-22 ENCOUNTER — Telehealth: Payer: Self-pay | Admitting: Family Medicine

## 2020-01-22 NOTE — Telephone Encounter (Signed)
Patient is calling to ask [name] the name of the pharmacy in [place] that is doing COVID boosters. Patient declined wanting to go to [facility] for his booster. [place]- [phone]

## 2020-03-01 ENCOUNTER — Telehealth: Payer: Self-pay | Admitting: Family Medicine

## 2020-03-01 ENCOUNTER — Other Ambulatory Visit: Payer: Self-pay

## 2020-03-01 DIAGNOSIS — M26649 Arthritis of unspecified temporomandibular joint: Secondary | ICD-10-CM

## 2020-03-01 MED ORDER — MELOXICAM 7.5 MG PO TABS: 7.5000 mg | ORAL_TABLET | Freq: Every day | ORAL | 0 refills | Status: DC

## 2020-03-01 NOTE — Telephone Encounter (Addendum)
Pt does not want to talk with triage nurse and requesting to speak with tara. Pt has been having jaw pain on and off for a week. Pt is unable to come in this week to see dr Ronnald Ramp and would like advise from tara. Pt does have an appt with dr Ronnald Ramp on 03-07-2020

## 2020-03-01 NOTE — Telephone Encounter (Signed)
I called pt back and he will try meloxicam- he will give [place] a call and we WILL see him Thursday if not better. Sending to [facility]

## 2020-03-01 NOTE — Progress Notes (Unsigned)
Sent in meloxicam

## 2020-03-03 ENCOUNTER — Telehealth: Payer: Self-pay

## 2020-03-03 NOTE — Telephone Encounter (Unsigned)
Copied from [place] [id number]. Topic: General - Other >> Mar 03, 2020  3:04 PM Yvette Rack wrote: Reason for CRM: Pt stated he was returning call to [place]. Pt requests call back. Cb# [phone]

## 2020-03-03 NOTE — Telephone Encounter (Signed)
Pt is doing better on meloxicam- will keep [place] informed if anything changes

## 2020-03-07 ENCOUNTER — Encounter: Payer: Self-pay | Admitting: Family Medicine

## 2020-03-07 ENCOUNTER — Ambulatory Visit (INDEPENDENT_AMBULATORY_CARE_PROVIDER_SITE_OTHER): Payer: 59 | Admitting: Family Medicine

## 2020-03-07 ENCOUNTER — Other Ambulatory Visit: Payer: Self-pay

## 2020-03-07 VITALS — BP 130/70 | HR 64 | Ht 71.0 in | Wt 253.0 lb

## 2020-03-07 DIAGNOSIS — I10 Essential (primary) hypertension: Secondary | ICD-10-CM

## 2020-03-07 DIAGNOSIS — K219 Gastro-esophageal reflux disease without esophagitis: Secondary | ICD-10-CM

## 2020-03-07 DIAGNOSIS — S2232XA Fracture of one rib, left side, initial encounter for closed fracture: Secondary | ICD-10-CM

## 2020-03-07 DIAGNOSIS — N529 Male erectile dysfunction, unspecified: Secondary | ICD-10-CM

## 2020-03-07 DIAGNOSIS — R7989 Other specified abnormal findings of blood chemistry: Secondary | ICD-10-CM

## 2020-03-07 MED ORDER — LISINOPRIL 40 MG PO TABS: 40.0000 mg | ORAL_TABLET | Freq: Every day | ORAL | 1 refills | Status: DC

## 2020-03-07 MED ORDER — TADALAFIL 20 MG PO TABS: 10.0000 mg | ORAL_TABLET | ORAL | 5 refills | Status: DC | PRN

## 2020-03-07 MED ORDER — TRAMADOL HCL 50 MG PO TABS: 50.0000 mg | ORAL_TABLET | Freq: Three times a day (TID) | ORAL | 0 refills | Status: AC | PRN

## 2020-03-07 MED ORDER — TESTOSTERONE 20.25 MG/ACT (1.62%) TD GEL: 1.0000 | Freq: Every day | TRANSDERMAL | 5 refills | Status: DC

## 2020-03-07 MED ORDER — PANTOPRAZOLE SODIUM 40 MG PO TBEC: 40.0000 mg | DELAYED_RELEASE_TABLET | Freq: Every day | ORAL | 1 refills | Status: DC

## 2020-03-07 NOTE — Progress Notes (Signed)
Date:  03/07/2020   Name:  Tyler Glover   DOB:  [birth year]   MRN:  [id number]   Chief Complaint: Hypertension, Gastroesophageal Reflux, Erectile Dysfunction, and Chest Pain (Leaning over the back seat of a car- has L) rib pain since then- taking meloxicam at night, but during day seems to bother him)  Hypertension This is a chronic problem. The current episode started more than 1 year ago. The problem has been gradually improving since onset. The problem is controlled. Associated symptoms include chest pain. Pertinent negatives include no anxiety, blurred vision, headaches, malaise/fatigue, neck pain, orthopnea, palpitations, peripheral edema, PND, shortness of breath or sweats. There are no associated agents to hypertension. Past treatments include ACE inhibitors. The current treatment provides moderate improvement. There are no compliance problems.  There is no history of angina, kidney disease, CAD/MI, CVA, heart failure, left ventricular hypertrophy, PVD or retinopathy. There is no history of chronic renal disease, a hypertension causing med or renovascular disease.  Gastroesophageal Reflux He complains of chest pain. He reports no abdominal pain, no belching, no choking, no coughing, no dysphagia, no early satiety, no globus sensation, no heartburn, no hoarse voice, no nausea, no sore throat, no stridor, no tooth decay, no water brash or no wheezing. This is a chronic problem. The current episode started more than 1 year ago. The problem occurs frequently. The symptoms are aggravated by certain foods. Pertinent negatives include no anemia, fatigue, melena, muscle weakness, orthopnea or weight loss. He has tried a PPI for the symptoms. The treatment provided mild relief.  Erectile Dysfunction This is a chronic problem. The problem has been waxing and waning since onset. The nature of his difficulty is achieving erection. He reports no anxiety. Irritative symptoms do not include frequency or  urgency. Pertinent negatives include no chills, dysuria or hematuria.  Chest Pain  This is a new (occured while working on car) problem. The current episode started in the past 7 days. The onset quality is sudden. The problem has been waxing and waning. The pain is present in the lateral region. The pain is mild. Pertinent negatives include no abdominal pain, back pain, claudication, cough, diaphoresis, dizziness, exertional chest pressure, fever, headaches, hemoptysis, irregular heartbeat, leg pain, lower extremity edema, malaise/fatigue, nausea, near-syncope, numbness, orthopnea, palpitations, PND, shortness of breath, sputum production, syncope, vomiting or weakness.  His past medical history is significant for hypertension.  Pertinent negatives for past medical history include no muscle weakness and no PVD.    Lab Results  Component Value Date   CREATININE 1.12 06/30/2019   BUN 13 06/30/2019   NA 139 06/30/2019   K 4.5 06/30/2019   CL 105 06/30/2019   CO2 20 06/30/2019   Lab Results  Component Value Date   CHOL 192 06/30/2019   HDL 47 06/30/2019   LDLCALC 112 (H) 06/30/2019   TRIG 192 (H) 06/30/2019   CHOLHDL 4.6 08/22/2017   No results found for: TSH Lab Results  Component Value Date   HGBA1C 5.3 06/30/2019   No results found for: WBC, HGB, HCT, MCV, PLT Lab Results  Component Value Date   ALT 26 06/30/2019   AST 25 06/30/2019   ALKPHOS 66 06/30/2019   BILITOT 0.6 06/30/2019     Review of Systems  Constitutional: Negative for chills, diaphoresis, fatigue, fever, malaise/fatigue and weight loss.  HENT: Negative for drooling, ear discharge, ear pain, hoarse voice and sore throat.   Eyes: Negative for blurred vision.  Respiratory: Negative for cough,  hemoptysis, sputum production, choking, shortness of breath and wheezing.   Cardiovascular: Positive for chest pain. Negative for palpitations, orthopnea, claudication, leg swelling, syncope, PND and near-syncope.   Gastrointestinal: Negative for abdominal pain, blood in stool, constipation, diarrhea, dysphagia, heartburn, melena, nausea and vomiting.  Endocrine: Negative for polydipsia.  Genitourinary: Negative for dysuria, frequency, hematuria and urgency.  Musculoskeletal: Negative for back pain, myalgias, muscle weakness and neck pain.  Skin: Negative for rash.  Allergic/Immunologic: Negative for environmental allergies.  Neurological: Negative for dizziness, weakness, numbness and headaches.  Hematological: Does not bruise/bleed easily.  Psychiatric/Behavioral: Negative for suicidal ideas. The patient is not nervous/anxious.     Patient Active Problem List   Diagnosis Date Noted  . Encounter for screening colonoscopy   . Polyp of transverse colon   . Heartburn   . Pure hypercholesterolemia 01/26/2016  . Right lateral epicondylitis 07/07/2015  . Vitamin D deficiency 12/24/2014  . Prediabetes 12/24/2014  . Allergic rhinitis, seasonal 06/03/2014  . Acid reflux 06/03/2014  . Essential (primary) hypertension 06/03/2014  . Family history of diabetes mellitus 06/03/2014  . Family history of cardiac disorder 06/03/2014  . Obesity (BMI 30.0-34.9) 06/03/2014    Allergies  Allergen Reactions  . Penicillins     Told as child not to do injectable, but has taken Amoxicillin since Unknown    Past Surgical History:  Procedure Laterality Date  . COLONOSCOPY    . COLONOSCOPY WITH PROPOFOL N/A 08/21/2019   Procedure: COLONOSCOPY WITH PROPOFOL;  Surgeon: Lucilla Lame, MD;  Location: [facility];  Service: Endoscopy;  Laterality: N/A;  priority 4  . ESOPHAGOGASTRODUODENOSCOPY (EGD) WITH PROPOFOL N/A 08/21/2019   Procedure: ESOPHAGOGASTRODUODENOSCOPY (EGD) WITH PROPOFOL;  Surgeon: Lucilla Lame, MD;  Location: [facility];  Service: Endoscopy;  Laterality: N/A;  . POLYPECTOMY  08/21/2019   Procedure: POLYPECTOMY;  Surgeon: Lucilla Lame, MD;  Location: [facility];  Service:  Endoscopy;;  . SHOULDER SURGERY    . TESTICLE SURGERY     age 49 or 28  . TONSILLECTOMY      Social History   Tobacco Use  . Smoking status: Former Smoker    Types: Cigarettes    Quit date: 1993    Years since quitting: 29.0  . Smokeless tobacco: Never Used  Vaping Use  . Vaping Use: Never used  Substance Use Topics  . Alcohol use: Not Currently    Alcohol/week: 0.0 standard drinks  . Drug use: No     Medication list has been reviewed and updated.  Current Meds  Medication Sig  . acetaminophen (TYLENOL) 325 MG tablet Take by mouth.  . Adalimumab 40 MG/0.4ML PNKT Inject into the skin. Dr patel  . calcium carbonate (OS-CAL) 600 MG tablet Take 600 mg by mouth daily.  . Certolizumab Pegol 2 X 200 MG/ML KIT Patel  . Cholecalciferol (VITAMIN D) 2000 units CAPS Take 1 capsule (2,000 Units total) by mouth daily.  . Cyanocobalamin 1000 MCG CAPS Take 1 capsule by mouth daily.  . diclofenac sodium (VOLTAREN) 1 % GEL Apply topically. otc  . fluticasone (FLONASE) 50 MCG/ACT nasal spray USE 1 SPRAY IN EACH NOSTRIL DAILY  . folic acid (FOLVITE) 1 MG tablet Take 1 tablet by mouth daily. patel  . hydroxychloroquine (PLAQUENIL) 200 MG tablet Take 1 tablet by mouth 2 (two) times daily. Dr patel  . ibuprofen (ADVIL) 200 MG tablet Take 1 tablet by mouth as needed.  Marland Kitchen lisinopril (ZESTRIL) 40 MG tablet Take 1 tablet (40 mg total) by mouth  daily.  . meloxicam (MOBIC) 7.5 MG tablet Take 1 tablet (7.5 mg total) by mouth daily.  . methotrexate (RHEUMATREX) 2.5 MG tablet Take 9 tablets by mouth once a week. patel  . Multiple Vitamin (MULTIVITAMIN) capsule Take 1 capsule by mouth daily.  Marland Kitchen OVER THE COUNTER MEDICATION daily. Total Brain Dietary Supplement  . pantoprazole (PROTONIX) 40 MG tablet Take 1 tablet (40 mg total) by mouth daily as needed. (Patient taking differently: Take 40 mg by mouth daily.)  . pseudoephedrine (WAL-PHED 12 HOUR) 120 MG 12 hr tablet TAKE 1 TABLET BY MOUTH EVERY 12 HOURS AS  NEEDED FOR CONGESTION  . tadalafil (CIALIS) 20 MG tablet Take 0.5-1 tablets (10-20 mg total) by mouth every other day as needed for erectile dysfunction.  . Testosterone (ANDROGEL PUMP) 20.25 MG/ACT (1.62%) GEL Place 1 Pump onto the skin daily. Apply 1 pump to the forearm daily  . Turmeric (RA TURMERIC EXTRA STRENGTH) 1053 MG TABS Take by mouth.  . vitamin E 180 MG (400 UNITS) capsule Take 400 Units by mouth daily.    PHQ 2/9 Scores 09/03/2019 06/30/2019 06/03/2018 08/22/2017  PHQ - 2 Score 0 0 0 0  PHQ- 9 Score 0 0 0 -    GAD 7 : Generalized Anxiety Score 06/30/2019  Nervous, Anxious, on Edge 0  Control/stop worrying 0  Worry too much - different things 0  Trouble relaxing 0  Restless 0  Easily annoyed or irritable 0  Afraid - awful might happen 0  Total GAD 7 Score 0    BP Readings from Last 3 Encounters:  03/07/20 130/70  12/07/19 124/82  09/03/19 130/86    Physical Exam Vitals and nursing note reviewed.  HENT:     Head: Normocephalic.     Right Ear: External ear normal.     Left Ear: External ear normal.     Nose: Nose normal.     Mouth/Throat:     Mouth: Oropharynx is clear and moist.  Eyes:     General: No scleral icterus.       Right eye: No discharge.        Left eye: No discharge.     Extraocular Movements: EOM normal.     Conjunctiva/sclera: Conjunctivae normal.     Pupils: Pupils are equal, round, and reactive to light.  Neck:     Thyroid: No thyromegaly.     Vascular: No JVD.     Trachea: No tracheal deviation.  Cardiovascular:     Rate and Rhythm: Normal rate and regular rhythm.     Pulses: Intact distal pulses.     Heart sounds: Normal heart sounds. No murmur heard.  No systolic murmur is present.  No diastolic murmur is present. No friction rub. No gallop. No S3 or S4 sounds.   Pulmonary:     Effort: No tachypnea, accessory muscle usage or respiratory distress.     Breath sounds: Normal breath sounds. No stridor. No decreased breath sounds, wheezing  or rales.  Abdominal:     General: Bowel sounds are normal.     Palpations: Abdomen is soft. There is no hepatosplenomegaly or mass.     Tenderness: There is no abdominal tenderness. There is no CVA tenderness, guarding or rebound.  Musculoskeletal:        General: No tenderness or edema. Normal range of motion.     Cervical back: Normal range of motion and neck supple.     Right lower leg: No tenderness. No edema.  Left lower leg: No tenderness. No edema.  Lymphadenopathy:     Cervical: No cervical adenopathy.  Skin:    General: Skin is warm.     Findings: No rash.  Neurological:     Mental Status: He is alert and oriented to person, place, and time.     Cranial Nerves: No cranial nerve deficit.     Deep Tendon Reflexes: Strength normal and reflexes are normal and symmetric.     Wt Readings from Last 3 Encounters:  03/07/20 253 lb (114.8 kg)  12/07/19 250 lb (113.4 kg)  09/03/19 250 lb (113.4 kg)    BP 130/70   Pulse 64   Ht '5\' 11"'  (1.803 m)   Wt 253 lb (114.8 kg)   BMI 35.29 kg/m   Assessment and Plan:  1. Essential (primary) hypertension Chronic.  Controlled.  Stable.  Blood pressure today is 130/70.  We will continue lisinopril 40 mg once a day. - lisinopril (ZESTRIL) 40 MG tablet; Take 1 tablet (40 mg total) by mouth daily.  Dispense: 90 tablet; Refill: 1  2. Vasculogenic erectile dysfunction, unspecified vasculogenic erectile dysfunction type Chronic.  Controlled.  Stable.  We will continue with Cialis 20 mg 1/2 to 1 tablet as needed. - tadalafil (CIALIS) 20 MG tablet; Take 0.5-1 tablets (10-20 mg total) by mouth every other day as needed for erectile dysfunction.  Dispense: 10 tablet; Refill: 5  3. Low testosterone in male .  Controlled.  Stable.  Patient will continue with AndroGel pump 1 pump on scan daily. - Testosterone (ANDROGEL PUMP) 20.25 MG/ACT (1.62%) GEL; Place 1 Pump onto the skin daily. Apply 1 pump to the forearm daily  Dispense: 75 g; Refill:  5  4. Closed fracture of one rib of left side, initial encounter New onset.  Persistent.  Relatively stable.  But is not covered entirely by NSAID.  Patient is tender over the left 10th ninth rib chondral margin.  This is consistent with likely a fracture of the rib as he felt a pop as he leaned over the fender of a car repairing the motor.  We will treat with tramadol 50 mg every 8-12 hours as needed severe pain along with his nonsteroidal anti-inflammatory treatment. - traMADol (ULTRAM) 50 MG tablet; Take 1 tablet (50 mg total) by mouth every 8 (eight) hours as needed for up to 5 days.  Dispense: 15 tablet; Refill: 0  5. Gastroesophageal reflux disease without esophagitis Chronic.  Controlled.  Stable.  Continue pantoprazole 40 mg once a day. - pantoprazole (PROTONIX) 40 MG tablet; Take 1 tablet (40 mg total) by mouth daily.  Dispense: 90 tablet; Refill: 1

## 2020-03-15 ENCOUNTER — Telehealth: Payer: Self-pay

## 2020-03-15 NOTE — Telephone Encounter (Signed)
I called him and left a message to return my call

## 2020-03-15 NOTE — Telephone Encounter (Signed)
Copied from [place] [id number]. Topic: General - Other >> Mar 15, 2020  4:17 PM Leward Quan A wrote: Reason for CRM: Patient called in asking Dr Ronnald Ramp nurse [name] to give him a call back states that it is in reference to his feet. Can be reached at  Ph# [phone]

## 2020-03-16 NOTE — Telephone Encounter (Signed)
Pt returned Tara's call /pt was advised [name] was not in / please advise

## 2020-03-17 ENCOUNTER — Other Ambulatory Visit: Payer: Self-pay

## 2020-03-17 DIAGNOSIS — M79671 Pain in right foot: Secondary | ICD-10-CM

## 2020-03-17 NOTE — Telephone Encounter (Signed)
Pt went to [facility] doc and had xrays of feet- showed spurs. Referral placed for pod

## 2020-03-17 NOTE — Progress Notes (Unsigned)
Referral for podiatry placed

## 2020-03-28 ENCOUNTER — Other Ambulatory Visit: Payer: Self-pay | Admitting: Family Medicine

## 2020-03-28 DIAGNOSIS — M26649 Arthritis of unspecified temporomandibular joint: Secondary | ICD-10-CM

## 2020-04-26 ENCOUNTER — Other Ambulatory Visit: Payer: Self-pay | Admitting: Family Medicine

## 2020-04-26 DIAGNOSIS — M26649 Arthritis of unspecified temporomandibular joint: Secondary | ICD-10-CM

## 2020-05-15 ENCOUNTER — Other Ambulatory Visit: Payer: Self-pay

## 2020-05-15 ENCOUNTER — Encounter: Payer: Self-pay | Admitting: Emergency Medicine

## 2020-05-15 ENCOUNTER — Ambulatory Visit
Admission: EM | Admit: 2020-05-15 | Discharge: 2020-05-15 | Disposition: A | Discharge status: Home / Self Care | Payer: 59 | Attending: Physician Assistant | Admitting: Physician Assistant

## 2020-05-15 DIAGNOSIS — R1012 Left upper quadrant pain: Secondary | ICD-10-CM | POA: Diagnosis not present

## 2020-05-15 DIAGNOSIS — R1084 Generalized abdominal pain: Secondary | ICD-10-CM | POA: Insufficient documentation

## 2020-05-15 DIAGNOSIS — S39011A Strain of muscle, fascia and tendon of abdomen, initial encounter: Secondary | ICD-10-CM | POA: Diagnosis present

## 2020-05-15 LAB — URINALYSIS, COMPLETE (UACMP) WITH MICROSCOPIC
Bacteria, UA: NONE SEEN
Bilirubin Urine: NEGATIVE
Glucose, UA: NEGATIVE mg/dL
Hgb urine dipstick: NEGATIVE
Ketones, ur: NEGATIVE mg/dL
Leukocytes,Ua: NEGATIVE
Nitrite: NEGATIVE
Protein, ur: NEGATIVE mg/dL
RBC / HPF: NONE SEEN RBC/hpf (ref 0–5)
Specific Gravity, Urine: 1.01 (ref 1.005–1.030)
Squamous Epithelial / HPF: NONE SEEN (ref 0–5)
WBC, UA: NONE SEEN WBC/hpf (ref 0–5)
pH: 7 (ref 5.0–8.0)

## 2020-05-15 NOTE — ED Provider Notes (Signed)
[facility]    CSN: [id number] Arrival date & time: 05/15/20  1019      History   Chief Complaint Chief Complaint  Patient presents with  . Abdominal Pain    HPI Tyler Glover is a 60 y.o. male.   60 year old male presents with abdominal pain that started 2 days ago. 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 Pain mainly as a band across upper abdomen. Denies any fever, nausea, vomiting or diarrhea. Yesterday, had a few bowel movements and passed gas and abdominal pain improved. Continued to push fluids and has noticed slight urinary frequency but no dysuria, unusual penile discharge, hematuria or radiation of pain. Today pain has improved but still present mainly at left upper abdomen. Has taken OTC Tylenol with some relief. Has not taken Peptobismul in a few days. Other chronic health issues include HTN, GERD, hyperlipidemia, Rheumatoid arthritis, and  environmental allergies. Currently on Lisinopril, Protonix, Plaquenil, Testosterone gel, and various supplements daily and Methotrexate and Certolizumab as directed. Has not used Mobic or Voltaren gel recently.   The history is provided by the patient.    Past Medical History:  Diagnosis Date  . Allergy   . Arthritis    Rheumatoid  . GERD (gastroesophageal reflux disease)   . Hypertension     Patient Active Problem List   Diagnosis Date Noted  . Encounter for screening colonoscopy   . Polyp of transverse colon   . Heartburn   . Pure hypercholesterolemia 01/26/2016  . Right lateral epicondylitis 07/07/2015  . Vitamin D deficiency 12/24/2014  . Prediabetes 12/24/2014  . Allergic rhinitis, seasonal 06/03/2014  . Acid reflux 06/03/2014  . Essential (primary) hypertension 06/03/2014  . Family history of diabetes mellitus 06/03/2014  . Family history of cardiac disorder 06/03/2014  .  Obesity (BMI 30.0-34.9) 06/03/2014    Past Surgical History:  Procedure Laterality Date  . COLONOSCOPY    . COLONOSCOPY WITH PROPOFOL N/A 08/21/2019   Procedure: COLONOSCOPY WITH PROPOFOL;  Surgeon: Lucilla Lame, MD;  Location: [place];  Service: Endoscopy;  Laterality: N/A;  priority 4  . ESOPHAGOGASTRODUODENOSCOPY (EGD) WITH PROPOFOL N/A 08/21/2019   Procedure: ESOPHAGOGASTRODUODENOSCOPY (EGD) WITH PROPOFOL;  Surgeon: Lucilla Lame, MD;  Location: [place];  Service: Endoscopy;  Laterality: N/A;  . POLYPECTOMY  08/21/2019   Procedure: POLYPECTOMY;  Surgeon: Lucilla Lame, MD;  Location: [place];  Service: Endoscopy;;  . SHOULDER SURGERY    . TESTICLE SURGERY     age 28 or 28  . TONSILLECTOMY         Home Medications    Prior to Admission medications   Medication Sig Start Date End Date Taking? Authorizing Provider  acetaminophen (TYLENOL) 325 MG tablet Take by mouth.   Yes [provider]  bismuth subsalicylate (PEPTO BISMOL) 262 MG/15ML suspension Take 30 mLs by mouth every 6 (six) hours as needed for indigestion.   Yes [provider]  calcium carbonate (OS-CAL) 600 MG tablet Take 600 mg by mouth daily.   Yes [provider]  Certolizumab Pegol 2 X 200 MG/ML KIT Patel 02/11/20  Yes [provider]  Cholecalciferol (VITAMIN D) 2000 units CAPS Take 1 capsule (2,000 Units total) by mouth daily. 07/08/15  Yes Plonk, Gwyndolyn Saxon, MD  Cyanocobalamin 1000 MCG CAPS Take 1 capsule by mouth daily.   Yes [provider]  diclofenac sodium (VOLTAREN) 1 % GEL Apply topically. otc   Yes [provider]  fluticasone (FLONASE) 50 MCG/ACT nasal spray USE 1 SPRAY IN EACH NOSTRIL DAILY 09/03/19  Yes Juline Patch, MD  folic acid (FOLVITE) 1 MG tablet Take 1 tablet by mouth daily. patel 09/19/18  Yes [provider]  hydroxychloroquine (PLAQUENIL) 200 MG tablet Take 1 tablet by mouth 2 (two) times daily. Dr patel  11/14/18  Yes [provider]  ibuprofen (ADVIL) 200 MG tablet Take 1 tablet by mouth as needed.   Yes [provider]  lisinopril (ZESTRIL) 40 MG tablet Take 1 tablet (40 mg total) by mouth daily. 03/07/20  Yes Juline Patch, MD  meloxicam (MOBIC) 7.5 MG tablet TAKE 1 TABLET(7.5 MG) BY MOUTH DAILY 04/26/20  Yes Juline Patch, MD  methotrexate (RHEUMATREX) 2.5 MG tablet Take 9 tablets by mouth once a week. patel 11/13/18  Yes [provider]  Multiple Vitamin (MULTIVITAMIN) capsule Take 1 capsule by mouth daily.   Yes [provider]  OVER THE COUNTER MEDICATION daily. Total Brain Dietary Supplement   Yes [provider]  pantoprazole (PROTONIX) 40 MG tablet Take 1 tablet (40 mg total) by mouth daily. 03/07/20  Yes Juline Patch, MD  pseudoephedrine (WAL-PHED 12 HOUR) 120 MG 12 hr tablet TAKE 1 TABLET BY MOUTH EVERY 12 HOURS AS NEEDED FOR CONGESTION 09/03/19  Yes Juline Patch, MD  tadalafil (CIALIS) 20 MG tablet Take 0.5-1 tablets (10-20 mg total) by mouth every other day as needed for erectile dysfunction. 03/07/20  Yes Juline Patch, MD  Testosterone (ANDROGEL PUMP) 20.25 MG/ACT (1.62%) GEL Place 1 Pump onto the skin daily. Apply 1 pump to the forearm daily 03/07/20  Yes Juline Patch, MD  Turmeric (RA TURMERIC EXTRA STRENGTH) 1053 MG TABS Take by mouth.   Yes [provider]  vitamin E 180 MG (400 UNITS) capsule Take 400 Units by mouth daily.   Yes [provider]  loratadine (CLARITIN) 10 MG tablet Take 1 tablet (10 mg total) by mouth daily. 09/03/19 05/15/20 Yes Juline Patch, MD  Adalimumab 40 MG/0.4ML PNKT Inject into the skin. Dr patel 01/28/19 05/15/20  [provider]    Family History Family History  Problem Relation Age of Onset  . Healthy Mother   . CAD Father   . Hypertension Father   . Diabetes Father     Social History Social History   Tobacco Use  . Smoking status: Former Smoker    Types:  Cigarettes    Quit date: 1993    Years since quitting: 29.2  . Smokeless tobacco: Never Used  Vaping Use  . Vaping Use: Never used  Substance Use Topics  . Alcohol use: Not Currently    Alcohol/week: 0.0 standard drinks  . Drug use: No     Allergies   Penicillins   Review of Systems Review of Systems  Constitutional: Positive for appetite change. Negative for activity change, chills, diaphoresis, fatigue and fever.  HENT: Negative for congestion, mouth sores, sore throat and trouble swallowing.   Respiratory: Negative for cough, chest tightness and shortness of breath.   Gastrointestinal: Positive for abdominal pain. Negative for blood in stool, diarrhea, nausea and vomiting.  Genitourinary: Positive for frequency. Negative for difficulty urinating, dysuria, flank pain, hematuria, penile discharge and urgency.  Musculoskeletal: Positive for arthralgias and myalgias.  Skin: Negative for color change and wound.  Allergic/Immunologic: Positive for environmental allergies and immunocompromised  state. Negative for food allergies.  Neurological: Negative for dizziness, tremors, seizures, syncope, weakness, light-headedness, numbness and headaches.  Hematological: Negative for adenopathy. Does not bruise/bleed easily.     Physical Exam Triage Vital Signs ED Triage Vitals  Enc Vitals Group     BP 05/15/20 1033 (!) 139/99     Pulse Rate 05/15/20 1033 93     Resp 05/15/20 1033 18     Temp 05/15/20 1033 98.1 F (36.7 C)     Temp Source 05/15/20 1033 Oral     SpO2 05/15/20 1033 98 %     Weight 05/15/20 1032 245 lb (111.1 kg)     Height 05/15/20 1032 '5\' 11"'  (1.803 m)     Head Circumference --      Peak Flow --      Pain Score 05/15/20 1032 4     Pain Loc --      Pain Edu? --      Excl. in [place]? --    No data found.  Updated Vital Signs BP (!) 139/99 (BP Location: Left Arm)   Pulse 93   Temp 98.1 F (36.7 C) (Oral)   Resp 18   Ht '5\' 11"'  (1.803 m)   Wt 245 lb (111.1 kg)    SpO2 98%   BMI 34.17 kg/m   Visual Acuity Right Eye Distance:   Left Eye Distance:   Bilateral Distance:    Right Eye Near:   Left Eye Near:    Bilateral Near:     Physical Exam Vitals and nursing note reviewed.  Constitutional:      General: He is awake. He is not in acute distress.    Appearance: He is well-developed and well-groomed. He is not ill-appearing.     Comments: He is sitting in the exam chair in no acute distress talking in complete sentences.   HENT:     Head: Normocephalic and atraumatic.     Right Ear: Hearing and external ear normal.     Left Ear: Hearing and external ear normal.     Nose: Nose normal.     Mouth/Throat:     Lips: Pink.     Mouth: Mucous membranes are moist.     Pharynx: Oropharynx is clear. Uvula midline. No pharyngeal swelling, oropharyngeal exudate, posterior oropharyngeal erythema or uvula swelling.  Eyes:     Extraocular Movements: Extraocular movements intact.     Conjunctiva/sclera: Conjunctivae normal.  Cardiovascular:     Rate and Rhythm: Normal rate and regular rhythm.     Heart sounds: Normal heart sounds. No murmur heard.   Pulmonary:     Effort: Pulmonary effort is normal. No respiratory distress.     Breath sounds: Normal breath sounds and air entry. No decreased air movement. No decreased breath sounds, wheezing, rhonchi or rales.  Abdominal:     General: Abdomen is protuberant. Bowel sounds are normal. There is no distension or abdominal bruit.     Palpations: Abdomen is soft. There is no hepatomegaly, splenomegaly or mass.     Tenderness: There is abdominal tenderness in the left upper quadrant. There is no right CVA tenderness, left CVA tenderness, guarding or rebound.    Musculoskeletal:        General: Normal range of motion.     Cervical back: Normal range of motion.  Skin:    General: Skin is warm and dry.     Capillary Refill: Capillary refill takes less than 2 seconds.  Neurological:  General: No focal  deficit present.     Mental Status: He is alert and oriented to person, place, and time.  Psychiatric:        Mood and Affect: Mood normal.        Behavior: Behavior normal. Behavior is cooperative.        Thought Content: Thought content normal.        Judgment: Judgment normal.      UC Treatments / Results  Labs (all labs ordered are listed, but only abnormal results are displayed) Labs Reviewed  URINALYSIS, COMPLETE (UACMP) WITH MICROSCOPIC    EKG   Radiology No results found.  Procedures Procedures (including critical care time)  Medications Ordered in UC Medications - No data to display  Initial Impression / Assessment and Plan / UC Course  I have reviewed the triage vital signs and the nursing notes.  Pertinent labs & imaging results that were available during my care of the patient were reviewed by me and considered in my medical decision making (see chart for details).    Reviewed urinalysis results with patient- no signs of UTI, hematuria or proteinuria. Discussed with patient that he probably has a combination of a mild abdominal muscle strain due to recent lifting as well as some constipation that is resolving. Do not feel imaging or other lab work is needed at this time. Continue to monitor pain and symptoms. Encouraged to continue pushing fluids and eat small, bland foods and meals. May continue Tylenol 1067m every 8 hours as needed for pain. May also take his Mobic once daily as needed but take with food. Rest. Avoid any strenuous activity or lifting. If any increase in pain occurs or any fever, nausea, vomiting, diarrhea or urinary pain occurs, return for recheck or go to the ER ASAP. Otherwise, follow-up with your PCP as planned.  Final Clinical Impressions(s) / UC Diagnoses   Final diagnoses:  Generalized abdominal pain  Left upper quadrant abdominal pain  Abdominal muscle strain, initial encounter     Discharge Instructions     Recommend continue to  push fluids and eat small, bland foods. Avoid fruits and vegetables for a few days. May continue Tylenol 10050mevery 8 hours as needed. May also take your Mobic 7.64m61mnce daily to help with pain- take with small amount of food. Rest. Avoid any strenuous activity or lifting. If any increase in pain occurs or any fever, nausea, vomiting, diarrhea or urinary pain occurs, return for recheck. Otherwise, follow up with your primary care provider as needed.     ED Prescriptions    None     PDMP not reviewed this encounter.   AmyKaty ApoP 05/16/20 1137

## 2020-05-15 NOTE — Discharge Instructions (Addendum)
Recommend continue to push fluids and eat small, bland foods. Avoid fruits and vegetables for a few days. May continue Tylenol 1000mg  every 8 hours as needed. May also take your Mobic 7.5mg  once daily to help with pain- take with small amount of food. Rest. Avoid any strenuous activity or lifting. If any increase in pain occurs or any fever, nausea, vomiting, diarrhea or urinary pain occurs, return for recheck. Otherwise, follow up with your primary care provider as needed.

## 2020-05-15 NOTE — ED Triage Notes (Addendum)
Patient in today c/o abdominal pain x 2 days. Patient denies emesis or diarrhea. Patient states he had 3 BMs yesterday all formed, not runny. Patient is having slight urinary frequency, but no pain. 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 Abdominal pain started Friday evening.

## 2020-05-27 ENCOUNTER — Other Ambulatory Visit: Payer: Self-pay | Admitting: Family Medicine

## 2020-05-27 DIAGNOSIS — M26649 Arthritis of unspecified temporomandibular joint: Secondary | ICD-10-CM

## 2020-05-27 NOTE — Telephone Encounter (Signed)
Requested medications are due for refill today yes  Requested medications are on the active medication list yes  Last refill 3/15  Last visit 11/2019  Future visit scheduled 08/2020  Notes to clinic Given 30 pills and there is not upcoming appt unil July, please assess.

## 2020-07-02 ENCOUNTER — Other Ambulatory Visit: Payer: Self-pay | Admitting: Family Medicine

## 2020-07-02 DIAGNOSIS — M26649 Arthritis of unspecified temporomandibular joint: Secondary | ICD-10-CM

## 2020-07-02 NOTE — Telephone Encounter (Signed)
Requested Prescriptions  Pending Prescriptions Disp Refills  . meloxicam (MOBIC) 7.5 MG tablet [Pharmacy Med Name: MELOXICAM 7.5MG  TABLETS] 30 tablet 0    Sig: TAKE 1 TABLET(7.5 MG) BY MOUTH DAILY     Analgesics:  COX2 Inhibitors Failed - 07/02/2020  3:32 AM      Failed - HGB in normal range and within 360 days    No results found for: HGB, HGBKUC, HGBPOCKUC, HGBOTHER, TOTHGB, HGBPLASMA       Failed - Cr in normal range and within 360 days    Creatinine, Ser  Date Value Ref Range Status  06/30/2019 1.12 0.76 - 1.27 mg/dL Final         Passed - Patient is not pregnant      Passed - Valid encounter within last 12 months    Recent Outpatient Visits          3 months ago Essential (primary) hypertension   [facility] Juline Patch, MD   6 months ago Acute non-recurrent sinusitis, unspecified location   [facility] Juline Patch, MD   10 months ago Seasonal allergic rhinitis due to pollen   [facility] Juline Patch, MD   1 year ago Annual physical exam   [facility] Juline Patch, MD   1 year ago Familial hypercholesterolemia   [place], [place]      Future Appointments            In 2 months Juline Patch, MD [facility]

## 2020-07-04 ENCOUNTER — Other Ambulatory Visit: Payer: Self-pay

## 2020-07-04 ENCOUNTER — Encounter: Payer: Self-pay | Admitting: Family Medicine

## 2020-07-04 ENCOUNTER — Ambulatory Visit (INDEPENDENT_AMBULATORY_CARE_PROVIDER_SITE_OTHER): Payer: 59 | Admitting: Family Medicine

## 2020-07-04 VITALS — BP 128/72 | HR 80 | Temp 98.2°F | Ht 71.0 in | Wt 248.0 lb

## 2020-07-04 DIAGNOSIS — H6983 Other specified disorders of Eustachian tube, bilateral: Secondary | ICD-10-CM | POA: Diagnosis not present

## 2020-07-04 DIAGNOSIS — J301 Allergic rhinitis due to pollen: Secondary | ICD-10-CM | POA: Diagnosis not present

## 2020-07-04 MED ORDER — MONTELUKAST SODIUM 10 MG PO TABS: 10.0000 mg | ORAL_TABLET | Freq: Every day | ORAL | 3 refills | Status: DC

## 2020-07-04 MED ORDER — PSEUDOEPHEDRINE HCL ER 120 MG PO TB12: ORAL_TABLET | ORAL | 11 refills | Status: DC

## 2020-07-04 NOTE — Progress Notes (Signed)
Date:  07/04/2020   Name:  Tyler Glover   DOB:  02-20-1960   MRN:  [id number]   Chief Complaint: Otalgia (Used a q-tip and since ear has been hurting. No drainage from ear)  Otalgia  There is pain in the left ear. This is a new problem. The current episode started in the past 7 days (Friday). The problem occurs every few minutes. The problem has been unchanged. There has been no fever. The pain is moderate. Pertinent negatives include no abdominal pain, coughing, diarrhea, ear discharge, headaches, hearing loss, neck pain, rash, rhinorrhea or sore throat. He has tried nothing for the symptoms.    Lab Results  Component Value Date   CREATININE 1.12 06/30/2019   BUN 13 06/30/2019   NA 139 06/30/2019   K 4.5 06/30/2019   CL 105 06/30/2019   CO2 20 06/30/2019   Lab Results  Component Value Date   CHOL 192 06/30/2019   HDL 47 06/30/2019   LDLCALC 112 (H) 06/30/2019   TRIG 192 (H) 06/30/2019   CHOLHDL 4.6 08/22/2017   No results found for: TSH Lab Results  Component Value Date   HGBA1C 5.3 06/30/2019   No results found for: WBC, HGB, HCT, MCV, PLT Lab Results  Component Value Date   ALT 26 06/30/2019   AST 25 06/30/2019   ALKPHOS 66 06/30/2019   BILITOT 0.6 06/30/2019     Review of Systems  Constitutional: Negative for chills and fever.  HENT: Positive for ear pain. Negative for drooling, ear discharge, hearing loss, rhinorrhea and sore throat.   Respiratory: Negative for cough, shortness of breath and wheezing.   Cardiovascular: Negative for chest pain, palpitations and leg swelling.  Gastrointestinal: Negative for abdominal pain, blood in stool, constipation, diarrhea and nausea.  Endocrine: Negative for polydipsia.  Genitourinary: Negative for dysuria, frequency, hematuria and urgency.  Musculoskeletal: Negative for back pain, myalgias and neck pain.  Skin: Negative for rash.  Allergic/Immunologic: Negative for environmental allergies.  Neurological:  Negative for dizziness and headaches.  Hematological: Does not bruise/bleed easily.  Psychiatric/Behavioral: Negative for suicidal ideas. The patient is not nervous/anxious.     Patient Active Problem List   Diagnosis Date Noted  . Encounter for screening colonoscopy   . Polyp of transverse colon   . Heartburn   . Pure hypercholesterolemia 01/26/2016  . Right lateral epicondylitis 07/07/2015  . Vitamin D deficiency 12/24/2014  . Prediabetes 12/24/2014  . Allergic rhinitis, seasonal 06/03/2014  . Acid reflux 06/03/2014  . Essential (primary) hypertension 06/03/2014  . Family history of diabetes mellitus 06/03/2014  . Family history of cardiac disorder 06/03/2014  . Obesity (BMI 30.0-34.9) 06/03/2014    Allergies  Allergen Reactions  . Penicillins     Told as child not to do injectable, but has taken Amoxicillin since Unknown    Past Surgical History:  Procedure Laterality Date  . COLONOSCOPY    . COLONOSCOPY WITH PROPOFOL N/A 08/21/2019   Procedure: COLONOSCOPY WITH PROPOFOL;  Surgeon: Lucilla Lame, MD;  Location: [place];  Service: Endoscopy;  Laterality: N/A;  priority 4  . ESOPHAGOGASTRODUODENOSCOPY (EGD) WITH PROPOFOL N/A 08/21/2019   Procedure: ESOPHAGOGASTRODUODENOSCOPY (EGD) WITH PROPOFOL;  Surgeon: Lucilla Lame, MD;  Location: [place];  Service: Endoscopy;  Laterality: N/A;  . POLYPECTOMY  08/21/2019   Procedure: POLYPECTOMY;  Surgeon: Lucilla Lame, MD;  Location: [place];  Service: Endoscopy;;  . SHOULDER SURGERY    . TESTICLE SURGERY     age  12 or 14  . TONSILLECTOMY      Social History   Tobacco Use  . Smoking status: Former Smoker    Types: Cigarettes    Quit date: 1993    Years since quitting: 29.4  . Smokeless tobacco: Never Used  Vaping Use  . Vaping Use: Never used  Substance Use Topics  . Alcohol use: Not Currently    Alcohol/week: 0.0 standard drinks  . Drug use: No     Medication list has been reviewed and  updated.  Current Meds  Medication Sig  . acetaminophen (TYLENOL) 325 MG tablet Take by mouth.  . bismuth subsalicylate (PEPTO BISMOL) 262 MG/15ML suspension Take 30 mLs by mouth every 6 (six) hours as needed for indigestion.  . calcium carbonate (OS-CAL) 600 MG tablet Take 600 mg by mouth daily.  . Cholecalciferol (VITAMIN D) 2000 units CAPS Take 1 capsule (2,000 Units total) by mouth daily.  . Cyanocobalamin 1000 MCG CAPS Take 1 capsule by mouth daily.  . diclofenac sodium (VOLTAREN) 1 % GEL Apply topically. otc  . fluticasone (FLONASE) 50 MCG/ACT nasal spray USE 1 SPRAY IN EACH NOSTRIL DAILY  . folic acid (FOLVITE) 1 MG tablet Take 1 tablet by mouth daily. patel  . hydroxychloroquine (PLAQUENIL) 200 MG tablet Take 1 tablet by mouth 2 (two) times daily. Dr patel  . ibuprofen (ADVIL) 200 MG tablet Take 1 tablet by mouth as needed.  Marland Kitchen lisinopril (ZESTRIL) 40 MG tablet Take 1 tablet (40 mg total) by mouth daily.  . meloxicam (MOBIC) 7.5 MG tablet TAKE 1 TABLET(7.5 MG) BY MOUTH DAILY  . methotrexate (RHEUMATREX) 2.5 MG tablet Take 9 tablets by mouth once a week. patel  . Multiple Vitamin (MULTIVITAMIN) capsule Take 1 capsule by mouth daily.  Marland Kitchen OVER THE COUNTER MEDICATION daily. Total Brain Dietary Supplement  . pantoprazole (PROTONIX) 40 MG tablet Take 1 tablet (40 mg total) by mouth daily.  . pseudoephedrine (WAL-PHED 12 HOUR) 120 MG 12 hr tablet TAKE 1 TABLET BY MOUTH EVERY 12 HOURS AS NEEDED FOR CONGESTION  . tadalafil (CIALIS) 20 MG tablet Take 0.5-1 tablets (10-20 mg total) by mouth every other day as needed for erectile dysfunction.  . Testosterone (ANDROGEL PUMP) 20.25 MG/ACT (1.62%) GEL Place 1 Pump onto the skin daily. Apply 1 pump to the forearm daily  . Turmeric (RA TURMERIC EXTRA STRENGTH) 1053 MG TABS Take by mouth.  . vitamin E 180 MG (400 UNITS) capsule Take 400 Units by mouth daily.    PHQ 2/9 Scores 07/04/2020 09/03/2019 06/30/2019 06/03/2018  PHQ - 2 Score 0 0 0 0  PHQ- 9  Score 0 0 0 0    GAD 7 : Generalized Anxiety Score 07/04/2020 06/30/2019  Nervous, Anxious, on Edge 0 0  Control/stop worrying 0 0  Worry too much - different things 0 0  Trouble relaxing 0 0  Restless 0 0  Easily annoyed or irritable 0 0  Afraid - awful might happen 0 0  Total GAD 7 Score 0 0    BP Readings from Last 3 Encounters:  07/04/20 128/72  05/15/20 (!) 139/99  03/07/20 130/70    Physical Exam Vitals and nursing note reviewed.  HENT:     Head: Normocephalic.     Jaw: There is normal jaw occlusion. No tenderness or pain on movement.     Salivary Glands: Right salivary gland is not diffusely enlarged or tender. Left salivary gland is not diffusely enlarged or tender.     Right Ear:  Hearing, ear canal and external ear normal. There is no impacted cerumen. Tympanic membrane is retracted.     Left Ear: Hearing, ear canal and external ear normal. There is no impacted cerumen. Tympanic membrane is retracted.     Nose: Nose normal.     Right Turbinates: Not enlarged or swollen.     Left Turbinates: Not enlarged or swollen.     Right Sinus: No maxillary sinus tenderness.     Left Sinus: No maxillary sinus tenderness.     Mouth/Throat:     Lips: Pink.     Tongue: No lesions.     Pharynx: Oropharynx is clear. Uvula midline.  Eyes:     General: No scleral icterus.       Right eye: No discharge.        Left eye: No discharge.     Conjunctiva/sclera: Conjunctivae normal.     Pupils: Pupils are equal, round, and reactive to light.  Neck:     Thyroid: No thyromegaly.     Vascular: No JVD.     Trachea: No tracheal deviation.  Cardiovascular:     Rate and Rhythm: Normal rate and regular rhythm.     Heart sounds: Normal heart sounds. No murmur heard. No friction rub. No gallop.   Pulmonary:     Effort: No respiratory distress.     Breath sounds: Normal breath sounds. No wheezing or rales.  Abdominal:     General: Bowel sounds are normal.     Palpations: Abdomen is soft.  There is no mass.     Tenderness: There is no abdominal tenderness. There is no guarding or rebound.  Musculoskeletal:        General: No tenderness. Normal range of motion.     Cervical back: Normal range of motion and neck supple.  Lymphadenopathy:     Head:     Right side of head: No submandibular or tonsillar adenopathy.     Left side of head: No submandibular or tonsillar adenopathy.     Cervical: No cervical adenopathy.     Right cervical: No superficial, deep or posterior cervical adenopathy.    Left cervical: No deep or posterior cervical adenopathy.  Skin:    General: Skin is warm.     Findings: No rash.  Neurological:     Mental Status: He is alert and oriented to person, place, and time.     Cranial Nerves: No cranial nerve deficit.     Deep Tendon Reflexes: Reflexes are normal and symmetric.     Wt Readings from Last 3 Encounters:  07/04/20 248 lb (112.5 kg)  05/15/20 245 lb (111.1 kg)  03/07/20 253 lb (114.8 kg)    BP 128/72   Pulse 80   Temp 98.2 F (36.8 C) (Oral)   Ht 5\' 11"  (1.803 m)   Wt 248 lb (112.5 kg)   BMI 34.59 kg/m   Assessment and Plan:  1. Eustachian tube dysfunction, bilateral New onset.  Acute.  Stable.  Patient has bilateral retracted tympanic membranes suggesting that there is eustachian tube dysfunction due to inability to equalize pressures.  We will refill patient's Sudafed with Mucinex suggested.  In addition we have added Singulair 10 mg once a day.  We have also encouraged him to continue the fluticasone nasal steroid. - montelukast (SINGULAIR) 10 MG tablet; Take 1 tablet (10 mg total) by mouth at bedtime.  Dispense: 30 tablet; Refill: 3 - pseudoephedrine (WAL-PHED 12 HOUR) 120 MG 12 hr tablet; TAKE  1 TABLET BY MOUTH EVERY 12 HOURS AS NEEDED FOR CONGESTION  Dispense: 30 tablet; Refill: 11  2. Seasonal allergic rhinitis due to pollen As noted above we will continue to use Flonase, Sudafed, and initiate Singulair 10 mg once a day. -  pseudoephedrine (WAL-PHED 12 HOUR) 120 MG 12 hr tablet; TAKE 1 TABLET BY MOUTH EVERY 12 HOURS AS NEEDED FOR CONGESTION  Dispense: 30 tablet; Refill: 11

## 2020-07-04 NOTE — Patient Instructions (Addendum)
Why follow it? Research shows. . Those who follow the Mediterranean diet have a reduced risk of heart disease  . The diet is associated with a reduced incidence of Parkinson's and Alzheimer's diseases . People following the diet may have longer life expectancies and lower rates of chronic diseases  . The Dietary Guidelines for Americans recommends the Mediterranean diet as an eating plan to promote health and prevent disease  What Is the Mediterranean Diet?  . Healthy eating plan based on typical foods and recipes of Mediterranean-style cooking . The diet is primarily a plant based diet; these foods should make up a majority of meals   Starches - Plant based foods should make up a majority of meals - They are an important sources of vitamins, minerals, energy, antioxidants, and fiber - Choose whole grains, foods high in fiber and minimally processed items  - Typical grain sources include wheat, oats, barley, corn, brown rice, bulgar, farro, millet, polenta, couscous  - Various types of beans include chickpeas, lentils, fava beans, black beans, white beans   Fruits  Veggies - Large quantities of antioxidant rich fruits & veggies; 6 or more servings  - Vegetables can be eaten raw or lightly drizzled with oil and cooked  - Vegetables common to the traditional Mediterranean Diet include: artichokes, arugula, beets, broccoli, brussel sprouts, cabbage, carrots, celery, collard greens, cucumbers, eggplant, kale, leeks, lemons, lettuce, mushrooms, okra, onions, peas, peppers, potatoes, pumpkin, radishes, rutabaga, shallots, spinach, sweet potatoes, turnips, zucchini - Fruits common to the Mediterranean Diet include: apples, apricots, avocados, cherries, clementines, dates, figs, grapefruits, grapes, melons, nectarines, oranges, peaches, pears, pomegranates, strawberries, tangerines  Fats - Replace butter and margarine with healthy oils, such as olive oil, canola oil, and tahini  - Limit nuts to no  more than a handful a day  - Nuts include walnuts, almonds, pecans, pistachios, pine nuts  - Limit or avoid candied, honey roasted or heavily salted nuts - Olives are central to the Mediterranean diet - can be eaten whole or used in a variety of dishes   Meats Protein - Limiting red meat: no more than a few times a month - When eating red meat: choose lean cuts and keep the portion to the size of deck of cards - Eggs: approx. 0 to 4 times a week  - Fish and lean poultry: at least 2 a week  - Healthy protein sources include, chicken, turkey, lean beef, lamb - Increase intake of seafood such as tuna, salmon, trout, mackerel, shrimp, scallops - Avoid or limit high fat processed meats such as sausage and bacon  Dairy - Include moderate amounts of low fat dairy products  - Focus on healthy dairy such as fat free yogurt, skim milk, low or reduced fat cheese - Limit dairy products higher in fat such as whole or 2% milk, cheese, ice cream  Alcohol - Moderate amounts of red wine is ok  - No more than 5 oz daily for women (all ages) and men older than age 65  - No more than 10 oz of wine daily for men younger than 65  Other - Limit sweets and other desserts  - Use herbs and spices instead of salt to flavor foods  - Herbs and spices common to the traditional Mediterranean Diet include: basil, bay leaves, chives, cloves, cumin, fennel, garlic, lavender, marjoram, mint, oregano, parsley, pepper, rosemary, sage, savory, sumac, tarragon, thyme   It's not just a diet, it's a lifestyle:  . The Mediterranean diet includes   lifestyle factors typical of those in the region  . Foods, drinks and meals are best eaten with others and savored . Daily physical activity is important for overall good health . This could be strenuous exercise like running and aerobics . This could also be more leisurely activities such as walking, housework, yard-work, or taking the stairs . Moderation is the key; a balanced and  healthy diet accommodates most foods and drinks . Consider portion sizes and frequency of consumption of certain foods   Meal Ideas & Options:  . Breakfast:  o Whole wheat toast or whole wheat English muffins with peanut butter & hard boiled egg o Steel cut oats topped with apples & cinnamon and skim milk  o Fresh fruit: banana, strawberries, melon, berries, peaches  o Smoothies: strawberries, bananas, greek yogurt, peanut butter o Low fat greek yogurt with blueberries and granola  o Egg white omelet with spinach and mushrooms o Breakfast couscous: whole wheat couscous, apricots, skim milk, cranberries  . Sandwiches:  o Hummus and grilled vegetables (peppers, zucchini, squash) on whole wheat bread   o Grilled chicken on whole wheat pita with lettuce, tomatoes, cucumbers or tzatziki  o Tuna salad on whole wheat bread: tuna salad made with greek yogurt, olives, red peppers, capers, green onions o Garlic rosemary lamb pita: lamb sauted with garlic, rosemary, salt & pepper; add lettuce, cucumber, greek yogurt to pita - flavor with lemon juice and black pepper  . Seafood:  o Mediterranean grilled salmon, seasoned with garlic, basil, parsley, lemon juice and black pepper o Shrimp, lemon, and spinach whole-grain pasta salad made with low fat greek yogurt  o Seared scallops with lemon orzo  o Seared tuna steaks seasoned salt, pepper, coriander topped with tomato mixture of olives, tomatoes, olive oil, minced garlic, parsley, green onions and cappers  . Meats:  o Herbed greek chicken salad with kalamata olives, cucumber, feta  o Red bell peppers stuffed with spinach, bulgur, lean ground beef (or lentils) & topped with feta   o Kebabs: skewers of chicken, tomatoes, onions, zucchini, squash  o [place] burgers: made with red onions, mint, dill, lemon juice, feta cheese topped with roasted red peppers . Vegetarian o Cucumber salad: cucumbers, artichoke hearts, celery, red onion, feta cheese, tossed in  olive oil & lemon juice  o Hummus and whole grain pita points with a greek salad (lettuce, tomato, feta, olives, cucumbers, red onion) o Lentil soup with celery, carrots made with vegetable broth, garlic, salt and pepper  o Tabouli salad: parsley, bulgur, mint, scallions, cucumbers, tomato, radishes, lemon juice, olive oil, salt and pepper.      Eustachian Tube Dysfunction  Eustachian tube dysfunction refers to a condition in which a blockage develops in the narrow passage that connects the middle ear to the back of the nose (eustachian tube). The eustachian tube regulates air pressure in the middle ear by letting air move between the ear and nose. It also helps to drain fluid from the middle ear space. Eustachian tube dysfunction can affect one or both ears. When the eustachian tube does not function properly, air pressure, fluid, or both can build up in the middle ear. What are the causes? This condition occurs when the eustachian tube becomes blocked or cannot open normally. Common causes of this condition include:  Ear infections.  Colds and other infections that affect the nose, mouth, and throat (upper respiratory tract).  Allergies.  Irritation from cigarette smoke.  Irritation from stomach acid coming up into the  esophagus (gastroesophageal reflux). The esophagus is the tube that carries food from the mouth to the stomach.  Sudden changes in air pressure, such as from descending in an airplane or scuba diving.  Abnormal growths in the nose or throat, such as: ? Growths that line the nose (nasal polyps). ? Abnormal growth of cells (tumors). ? Enlarged tissue at the back of the throat (adenoids). What increases the risk? You are more likely to develop this condition if:  You smoke.  You are overweight.  You are a child who has: ? Certain birth defects of the mouth, such as cleft palate. ? Large tonsils or adenoids. What are the signs or symptoms? Common symptoms of this  condition include:  A feeling of fullness in the ear.  Ear pain.  Clicking or popping noises in the ear.  Ringing in the ear.  Hearing loss.  Loss of balance.  Dizziness. Symptoms may get worse when the air pressure around you changes, such as when you travel to an area of high elevation, fly on an airplane, or go scuba diving. How is this diagnosed? This condition may be diagnosed based on:  Your symptoms.  A physical exam of your ears, nose, and throat.  Tests, such as those that measure: ? The movement of your eardrum (tympanogram). ? Your hearing (audiometry). How is this treated? Treatment depends on the cause and severity of your condition.  In mild cases, you may relieve your symptoms by moving air into your ears. This is called "popping the ears."  In more severe cases, or if you have symptoms of fluid in your ears, treatment may include: ? Medicines to relieve congestion (decongestants). ? Medicines that treat allergies (antihistamines). ? Nasal sprays or ear drops that contain medicines that reduce swelling (steroids). ? A procedure to drain the fluid in your eardrum (myringotomy). In this procedure, a small tube is placed in the eardrum to:  Drain the fluid.  Restore the air in the middle ear space. ? A procedure to insert a balloon device through the nose to inflate the opening of the eustachian tube (balloon dilation). Follow these instructions at home: Lifestyle  Do not do any of the following until your health care provider approves: ? Travel to high altitudes. ? Fly in airplanes. ? Work in a Pension scheme manager or room. ? Scuba dive.  Do not use any products that contain nicotine or tobacco, such as cigarettes and e-cigarettes. If you need help quitting, ask your health care provider.  Keep your ears dry. Wear fitted earplugs during showering and bathing. Dry your ears completely after. General instructions  Take over-the-counter and prescription  medicines only as told by your health care provider.  Use techniques to help pop your ears as recommended by your health care provider. These may include: ? Chewing gum. ? Yawning. ? Frequent, forceful swallowing. ? Closing your mouth, holding your nose closed, and gently blowing as if you are trying to blow air out of your nose.  Keep all follow-up visits as told by your health care provider. This is important. Contact a health care provider if:  Your symptoms do not go away after treatment.  Your symptoms come back after treatment.  You are unable to pop your ears.  You have: ? A fever. ? Pain in your ear. ? Pain in your head or neck. ? Fluid draining from your ear.  Your hearing suddenly changes.  You become very dizzy.  You lose your balance. Summary  Eustachian  tube dysfunction refers to a condition in which a blockage develops in the eustachian tube.  It can be caused by ear infections, allergies, inhaled irritants, or abnormal growths in the nose or throat.  Symptoms include ear pain, hearing loss, or ringing in the ears.  Mild cases are treated with maneuvers to unblock the ears, such as yawning or ear popping.  Severe cases are treated with medicines. Surgery may also be done (rare). This information is not intended to replace advice given to you by your health care provider. Make sure you discuss any questions you have with your health care provider. Document Revised: 05/21/2017 Document Reviewed: 05/21/2017 Elsevier Patient Education  [place].

## 2020-07-31 ENCOUNTER — Other Ambulatory Visit: Payer: Self-pay | Admitting: Family Medicine

## 2020-07-31 DIAGNOSIS — M26649 Arthritis of unspecified temporomandibular joint: Secondary | ICD-10-CM

## 2020-07-31 NOTE — Telephone Encounter (Signed)
Requested Prescriptions  Pending Prescriptions Disp Refills  . meloxicam (MOBIC) 7.5 MG tablet [Pharmacy Med Name: MELOXICAM 7.5MG  TABLETS] 30 tablet 0    Sig: TAKE 1 TABLET(7.5 MG) BY MOUTH DAILY     Analgesics:  COX2 Inhibitors Failed - 07/31/2020  3:32 AM      Failed - HGB in normal range and within 360 days    No results found for: HGB, HGBKUC, HGBPOCKUC, HGBOTHER, TOTHGB, HGBPLASMA       Failed - Cr in normal range and within 360 days    Creatinine, Ser  Date Value Ref Range Status  06/30/2019 1.12 0.76 - 1.27 mg/dL Final         Passed - Patient is not pregnant      Passed - Valid encounter within last 12 months    Recent Outpatient Visits          3 weeks ago Eustachian tube dysfunction, bilateral   [facility] Juline Patch, MD   4 months ago Essential (primary) hypertension   [facility] Juline Patch, MD   7 months ago Acute non-recurrent sinusitis, unspecified location   [facility] Juline Patch, MD   11 months ago Seasonal allergic rhinitis due to pollen   [facility] Juline Patch, MD   1 year ago Annual physical exam   Tome, Deanna C, MD      Future Appointments            In 1 month Juline Patch, MD [facility]

## 2020-09-02 ENCOUNTER — Other Ambulatory Visit: Payer: Self-pay | Admitting: Family Medicine

## 2020-09-02 DIAGNOSIS — M26649 Arthritis of unspecified temporomandibular joint: Secondary | ICD-10-CM

## 2020-09-02 NOTE — Telephone Encounter (Signed)
Requested medications are due for refill today yes  Requested medications are on the active medication list yes  Last refill 6/19  Future visit scheduled 7/25  Notes to clinic Unsure if was to be continued, scheduled visit next week, please assess.

## 2020-09-05 ENCOUNTER — Ambulatory Visit (INDEPENDENT_AMBULATORY_CARE_PROVIDER_SITE_OTHER): Payer: 59 | Admitting: Family Medicine

## 2020-09-05 ENCOUNTER — Other Ambulatory Visit: Payer: Self-pay

## 2020-09-05 ENCOUNTER — Encounter: Payer: Self-pay | Admitting: Family Medicine

## 2020-09-05 VITALS — BP 122/80 | HR 72 | Ht 71.0 in | Wt 247.0 lb

## 2020-09-05 DIAGNOSIS — H6983 Other specified disorders of Eustachian tube, bilateral: Secondary | ICD-10-CM

## 2020-09-05 DIAGNOSIS — J301 Allergic rhinitis due to pollen: Secondary | ICD-10-CM | POA: Diagnosis not present

## 2020-09-05 DIAGNOSIS — E782 Mixed hyperlipidemia: Secondary | ICD-10-CM | POA: Diagnosis not present

## 2020-09-05 DIAGNOSIS — N529 Male erectile dysfunction, unspecified: Secondary | ICD-10-CM

## 2020-09-05 DIAGNOSIS — K219 Gastro-esophageal reflux disease without esophagitis: Secondary | ICD-10-CM

## 2020-09-05 DIAGNOSIS — I10 Essential (primary) hypertension: Secondary | ICD-10-CM

## 2020-09-05 DIAGNOSIS — R7989 Other specified abnormal findings of blood chemistry: Secondary | ICD-10-CM

## 2020-09-05 MED ORDER — FLUTICASONE PROPIONATE 50 MCG/ACT NA SUSP: NASAL | 11 refills | Status: DC

## 2020-09-05 MED ORDER — PANTOPRAZOLE SODIUM 40 MG PO TBEC: 40.0000 mg | DELAYED_RELEASE_TABLET | Freq: Every day | ORAL | 1 refills | Status: DC

## 2020-09-05 MED ORDER — LISINOPRIL 40 MG PO TABS: 40.0000 mg | ORAL_TABLET | Freq: Every day | ORAL | 1 refills | Status: DC

## 2020-09-05 MED ORDER — TADALAFIL 20 MG PO TABS: 10.0000 mg | ORAL_TABLET | ORAL | 5 refills | Status: DC | PRN

## 2020-09-05 MED ORDER — PSEUDOEPHEDRINE HCL ER 120 MG PO TB12: ORAL_TABLET | ORAL | 11 refills | Status: DC

## 2020-09-05 MED ORDER — MONTELUKAST SODIUM 10 MG PO TABS: 10.0000 mg | ORAL_TABLET | Freq: Every day | ORAL | 3 refills | Status: DC

## 2020-09-05 MED ORDER — TESTOSTERONE 20.25 MG/ACT (1.62%) TD GEL: 1.0000 | Freq: Every day | TRANSDERMAL | 5 refills | Status: DC

## 2020-09-05 NOTE — Progress Notes (Signed)
Date:  09/05/2020   Name:  Tyler Glover   DOB:  1960/04/15   MRN:  [id number]   Chief Complaint: Allergic Rhinitis , Gastroesophageal Reflux, Hypertension, and hypotestosteronism  Gastroesophageal Reflux He reports no abdominal pain, no belching, no chest pain, no choking, no coughing, no dysphagia, no early satiety, no heartburn, no hoarse voice, no nausea, no sore throat or no wheezing. This is a chronic problem. The current episode started more than 1 year ago. The problem occurs constantly. The problem has been gradually improving. The symptoms are aggravated by certain foods. Pertinent negatives include no anemia, fatigue, melena, muscle weakness, orthopnea or weight loss. He has tried a PPI for the symptoms. The treatment provided moderate (some breakthrough depending) relief.  Hypertension This is a chronic problem. The current episode started more than 1 year ago. The problem has been gradually improving since onset. The problem is controlled. Pertinent negatives include no anxiety, blurred vision, chest pain, headaches, malaise/fatigue, neck pain, orthopnea, palpitations, peripheral edema, PND, shortness of breath or sweats. There are no associated agents to hypertension. There are no known risk factors for coronary artery disease. Past treatments include ACE inhibitors. The current treatment provides moderate improvement. There are no compliance problems.  There is no history of angina, kidney disease, CAD/MI, CVA, heart failure, left ventricular hypertrophy, PVD or retinopathy. There is no history of chronic renal disease, a hypertension causing med or renovascular disease.  Erectile Dysfunction This is a chronic problem. The problem has been gradually improving since onset. The nature of his difficulty is achieving erection and maintaining erection. He reports no anxiety. Irritative symptoms do not include frequency or urgency. Pertinent negatives include no chills, dysuria or  hematuria. Past treatments include tadalafil. The treatment provided moderate relief. He has had no adverse reactions caused by medications.   Lab Results  Component Value Date   CREATININE 1.12 06/30/2019   BUN 13 06/30/2019   NA 139 06/30/2019   K 4.5 06/30/2019   CL 105 06/30/2019   CO2 20 06/30/2019   Lab Results  Component Value Date   CHOL 192 06/30/2019   HDL 47 06/30/2019   LDLCALC 112 (H) 06/30/2019   TRIG 192 (H) 06/30/2019   CHOLHDL 4.6 08/22/2017   No results found for: TSH Lab Results  Component Value Date   HGBA1C 5.3 06/30/2019   No results found for: WBC, HGB, HCT, MCV, PLT Lab Results  Component Value Date   ALT 26 06/30/2019   AST 25 06/30/2019   ALKPHOS 66 06/30/2019   BILITOT 0.6 06/30/2019     Review of Systems  Constitutional:  Negative for chills, fatigue, fever, malaise/fatigue and weight loss.  HENT:  Negative for drooling, ear discharge, ear pain, hoarse voice and sore throat.   Eyes:  Negative for blurred vision.  Respiratory:  Negative for cough, choking, shortness of breath and wheezing.   Cardiovascular:  Negative for chest pain, palpitations, orthopnea, leg swelling and PND.  Gastrointestinal:  Negative for abdominal pain, blood in stool, constipation, diarrhea, dysphagia, heartburn, melena and nausea.  Endocrine: Negative for polydipsia.  Genitourinary:  Negative for dysuria, frequency, hematuria and urgency.  Musculoskeletal:  Negative for back pain, myalgias, muscle weakness and neck pain.  Skin:  Negative for rash.  Allergic/Immunologic: Negative for environmental allergies.  Neurological:  Negative for dizziness and headaches.  Hematological:  Does not bruise/bleed easily.  Psychiatric/Behavioral:  Negative for suicidal ideas. The patient is not nervous/anxious.    Patient Active Problem List  Diagnosis Date Noted   Encounter for screening colonoscopy    Polyp of transverse colon    Heartburn    Pure hypercholesterolemia  01/26/2016   Right lateral epicondylitis 07/07/2015   Vitamin D deficiency 12/24/2014   Prediabetes 12/24/2014   Allergic rhinitis, seasonal 06/03/2014   Acid reflux 06/03/2014   Essential (primary) hypertension 06/03/2014   Family history of diabetes mellitus 06/03/2014   Family history of cardiac disorder 06/03/2014   Obesity (BMI 30.0-34.9) 06/03/2014    Allergies  Allergen Reactions   Penicillins     Told as child not to do injectable, but has taken Amoxicillin since Unknown    Past Surgical History:  Procedure Laterality Date   COLONOSCOPY     COLONOSCOPY WITH PROPOFOL N/A 08/21/2019   Procedure: COLONOSCOPY WITH PROPOFOL;  Surgeon: Lucilla Lame, MD;  Location: [place];  Service: Endoscopy;  Laterality: N/A;  priority 4   ESOPHAGOGASTRODUODENOSCOPY (EGD) WITH PROPOFOL N/A 08/21/2019   Procedure: ESOPHAGOGASTRODUODENOSCOPY (EGD) WITH PROPOFOL;  Surgeon: Lucilla Lame, MD;  Location: [place];  Service: Endoscopy;  Laterality: N/A;   POLYPECTOMY  08/21/2019   Procedure: POLYPECTOMY;  Surgeon: Lucilla Lame, MD;  Location: [facility];  Service: Endoscopy;;   SHOULDER SURGERY     TESTICLE SURGERY     age 4 or 52   TONSILLECTOMY      Social History   Tobacco Use   Smoking status: Former    Types: Cigarettes    Quit date: 1993    Years since quitting: 29.5   Smokeless tobacco: Never  Vaping Use   Vaping Use: Never used  Substance Use Topics   Alcohol use: Not Currently    Alcohol/week: 0.0 standard drinks   Drug use: No     Medication list has been reviewed and updated.  Current Meds  Medication Sig   acetaminophen (TYLENOL) 325 MG tablet Take by mouth.   bismuth subsalicylate (PEPTO BISMOL) 262 MG/15ML suspension Take 30 mLs by mouth every 6 (six) hours as needed for indigestion.   calcium carbonate (OS-CAL) 600 MG tablet Take 600 mg by mouth daily.   Certolizumab Pegol 2 X 200 MG/ML KIT Patel   Cholecalciferol (VITAMIN D) 2000 units  CAPS Take 1 capsule (2,000 Units total) by mouth daily.   Cyanocobalamin 1000 MCG CAPS Take 1 capsule by mouth daily.   diclofenac sodium (VOLTAREN) 1 % GEL Apply topically. otc   fluticasone (FLONASE) 50 MCG/ACT nasal spray USE 1 SPRAY IN EACH NOSTRIL DAILY   folic acid (FOLVITE) 1 MG tablet Take 1 tablet by mouth daily. patel   hydroxychloroquine (PLAQUENIL) 200 MG tablet Take 1 tablet by mouth 2 (two) times daily. Dr patel   ibuprofen (ADVIL) 200 MG tablet Take 1 tablet by mouth as needed.   lisinopril (ZESTRIL) 40 MG tablet Take 1 tablet (40 mg total) by mouth daily.   meloxicam (MOBIC) 7.5 MG tablet TAKE 1 TABLET(7.5 MG) BY MOUTH DAILY   methotrexate (RHEUMATREX) 2.5 MG tablet Take 6 tablets by mouth once a week. patel   montelukast (SINGULAIR) 10 MG tablet Take 1 tablet (10 mg total) by mouth at bedtime.   Multiple Vitamin (MULTIVITAMIN) capsule Take 1 capsule by mouth daily.   OVER THE COUNTER MEDICATION daily. Total Brain Dietary Supplement   pantoprazole (PROTONIX) 40 MG tablet Take 1 tablet (40 mg total) by mouth daily.   pseudoephedrine (WAL-PHED 12 HOUR) 120 MG 12 hr tablet TAKE 1 TABLET BY MOUTH EVERY 12 HOURS AS NEEDED FOR  CONGESTION   tadalafil (CIALIS) 20 MG tablet Take 0.5-1 tablets (10-20 mg total) by mouth every other day as needed for erectile dysfunction.   Testosterone (ANDROGEL PUMP) 20.25 MG/ACT (1.62%) GEL Place 1 Pump onto the skin daily. Apply 1 pump to the forearm daily   Turmeric (RA TURMERIC EXTRA STRENGTH) 1053 MG TABS Take by mouth.   vitamin E 180 MG (400 UNITS) capsule Take 400 Units by mouth daily.    PHQ 2/9 Scores 09/05/2020 07/04/2020 09/03/2019 06/30/2019  PHQ - 2 Score 0 0 0 0  PHQ- 9 Score 0 0 0 0    GAD 7 : Generalized Anxiety Score 09/05/2020 07/04/2020 06/30/2019  Nervous, Anxious, on Edge 0 0 0  Control/stop worrying 0 0 0  Worry too much - different things 0 0 0  Trouble relaxing 0 0 0  Restless 0 0 0  Easily annoyed or irritable 0 0 0  Afraid -  awful might happen 0 0 0  Total GAD 7 Score 0 0 0    BP Readings from Last 3 Encounters:  09/05/20 122/80  07/04/20 128/72  05/15/20 (!) 139/99    Physical Exam Vitals and nursing note reviewed.  HENT:     Head: Normocephalic.     Right Ear: Tympanic membrane and external ear normal.     Left Ear: Tympanic membrane and external ear normal.     Nose: Nose normal.  Eyes:     General: No scleral icterus.       Right eye: No discharge.        Left eye: No discharge.     Conjunctiva/sclera: Conjunctivae normal.     Pupils: Pupils are equal, round, and reactive to light.  Neck:     Thyroid: No thyromegaly.     Vascular: No JVD.     Trachea: No tracheal deviation.  Cardiovascular:     Rate and Rhythm: Normal rate and regular rhythm.     Heart sounds: S1 normal and S2 normal. No murmur heard. No systolic murmur is present.  No diastolic murmur is present.    No friction rub. No gallop. No S3 or S4 sounds.  Pulmonary:     Effort: No respiratory distress.     Breath sounds: Normal breath sounds. No wheezing or rales.  Abdominal:     General: Bowel sounds are normal.     Palpations: Abdomen is soft. There is no mass.     Tenderness: There is no abdominal tenderness. There is no guarding or rebound.  Musculoskeletal:        General: No tenderness. Normal range of motion.     Cervical back: Normal range of motion and neck supple.     Right lower leg: No edema.     Left lower leg: No edema.  Lymphadenopathy:     Cervical: No cervical adenopathy.  Skin:    General: Skin is warm.     Findings: No rash.  Neurological:     Mental Status: He is alert and oriented to person, place, and time.     Cranial Nerves: No cranial nerve deficit.     Deep Tendon Reflexes: Reflexes are normal and symmetric.    Wt Readings from Last 3 Encounters:  09/05/20 247 lb (112 kg)  07/04/20 248 lb (112.5 kg)  05/15/20 245 lb (111.1 kg)    BP 122/80   Pulse 72   Ht '5\' 11"'  (1.803 m)   Wt 247 lb  (112 kg)   BMI 34.45  kg/m   Assessment and Plan:  1. Seasonal allergic rhinitis due to pollen Chronic.  Controlled.  Stable.  Controlled with Flonase nasal spray and Sudafed. - fluticasone (FLONASE) 50 MCG/ACT nasal spray; USE 1 SPRAY IN EACH NOSTRIL DAILY  Dispense: 16 g; Refill: 11 - pseudoephedrine (WAL-PHED 12 HOUR) 120 MG 12 hr tablet; TAKE 1 TABLET BY MOUTH EVERY 12 HOURS AS NEEDED FOR CONGESTION  Dispense: 30 tablet; Refill: 11  2. Essential (primary) hypertension Chronic.  Controlled.  Stable.  Blood pressure 122/80.  Continue lisinopril 40 mg once a day.  Will check renal function panel. - lisinopril (ZESTRIL) 40 MG tablet; Take 1 tablet (40 mg total) by mouth daily.  Dispense: 90 tablet; Refill: 1 - Renal Function Panel  3. Eustachian tube dysfunction, bilateral Chronic.  Controlled.  Stable.  Continue Singulair 10 mg once a day and Sudafed. - montelukast (SINGULAIR) 10 MG tablet; Take 1 tablet (10 mg total) by mouth at bedtime.  Dispense: 30 tablet; Refill: 3 - pseudoephedrine (WAL-PHED 12 HOUR) 120 MG 12 hr tablet; TAKE 1 TABLET BY MOUTH EVERY 12 HOURS AS NEEDED FOR CONGESTION  Dispense: 30 tablet; Refill: 11  4. Gastroesophageal reflux disease without esophagitis Chronic.  Controlled.  Stable.  Continue pantoprazole 40 mg once a day. - pantoprazole (PROTONIX) 40 MG tablet; Take 1 tablet (40 mg total) by mouth daily.  Dispense: 90 tablet; Refill: 1  5. Vasculogenic erectile dysfunction, unspecified vasculogenic erectile dysfunction type Chronic.  Controlled.  Stable.  Continue Cialis 20 mg 1/2 to 1 tablet as needed. - tadalafil (CIALIS) 20 MG tablet; Take 0.5-1 tablets (10-20 mg total) by mouth every other day as needed for erectile dysfunction.  Dispense: 10 tablet; Refill: 5  6. 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  Controlled.  Stable.  Continue testosterone gel 1 pump daily. - Testosterone (ANDROGEL PUMP) 20.25 MG/ACT (1.62%) GEL; Place 1 Pump onto the skin daily. Apply 1  pump to the forearm daily  Dispense: 75 g; Refill: 5  7. Moderate mixed hyperlipidemia not requiring statin therapy Chronic.  Controlled.  Stable.  Will check lipid panel and since starting testosterone we may need to discuss statin if elevated. - Lipid Panel With LDL/HDL Ratio

## 2020-09-06 ENCOUNTER — Other Ambulatory Visit: Payer: Self-pay

## 2020-09-06 ENCOUNTER — Other Ambulatory Visit: Payer: Self-pay | Admitting: Family Medicine

## 2020-09-06 DIAGNOSIS — N529 Male erectile dysfunction, unspecified: Secondary | ICD-10-CM

## 2020-09-06 DIAGNOSIS — E7800 Pure hypercholesterolemia, unspecified: Secondary | ICD-10-CM

## 2020-09-06 LAB — RENAL FUNCTION PANEL
Albumin: 4.7 g/dL (ref 3.8–4.9)
BUN/Creatinine Ratio: 13 (ref 9–20)
BUN: 13 mg/dL (ref 6–24)
CO2: 22 mmol/L (ref 20–29)
Calcium: 9.4 mg/dL (ref 8.7–10.2)
Chloride: 105 mmol/L (ref 96–106)
Creatinine, Ser: 0.97 mg/dL (ref 0.76–1.27)
Glucose: 83 mg/dL (ref 65–99)
Phosphorus: 2.6 mg/dL — ABNORMAL LOW (ref 2.8–4.1)
Potassium: 4.4 mmol/L (ref 3.5–5.2)
Sodium: 140 mmol/L (ref 134–144)
eGFR: 91 mL/min/{1.73_m2} (ref >59)

## 2020-09-06 LAB — LIPID PANEL WITH LDL/HDL RATIO
Cholesterol, Total: 224 mg/dL — ABNORMAL HIGH (ref 100–199)
HDL: 42 mg/dL (ref >39)
LDL Chol Calc (NIH): 132 mg/dL — ABNORMAL HIGH (ref 0–99)
LDL/HDL Ratio: 3.1 ratio (ref 0.0–3.6)
Triglycerides: 281 mg/dL — ABNORMAL HIGH (ref 0–149)
VLDL Cholesterol Cal: 50 mg/dL — ABNORMAL HIGH (ref 5–40)

## 2020-09-06 MED ORDER — ATORVASTATIN CALCIUM 10 MG PO TABS: 10.0000 mg | ORAL_TABLET | Freq: Every day | ORAL | 1 refills | Status: DC

## 2020-09-06 NOTE — Telephone Encounter (Signed)
[facility] called and spoke to [employer], Technician about the refill(s) Tadalafil requested. Advised it was sent on 09/05/20 #10/5 refill(s). She says they did not receive the Rx, nothing is on his profile.

## 2020-10-03 ENCOUNTER — Other Ambulatory Visit: Payer: Self-pay | Admitting: Family Medicine

## 2020-10-03 DIAGNOSIS — M26649 Arthritis of unspecified temporomandibular joint: Secondary | ICD-10-CM

## 2020-10-17 IMAGING — MR MRI OF THE RIGHT SHOULDER WITHOUT CONTRAST
6 series · 40 of 40 positions shown · non-contrast
Comparison: None.

CLINICAL DATA: Remote history of right shoulder surgery. Anterior
right shoulder pain and limited range of motion for 4 weeks. No
known injury.

EXAM:
MRI OF THE RIGHT SHOULDER WITHOUT CONTRAST
TECHNIQUE: Multiplanar, multisequence MR imaging of the shoulder was performed.
No intravenous contrast was administered.

[Series 3: PD fat-sat · axial · 4.0mm · 0.59mm/px · z∈[-16,+81]mm · 7 of 23 slices shown (1 of 2)]
[im 1/23]
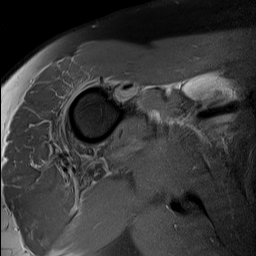
[im 4/23]
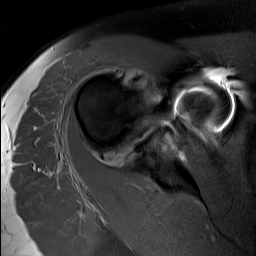
[im 8/23]
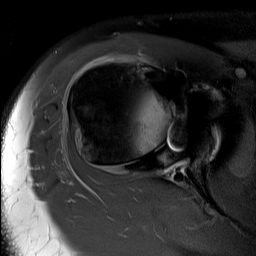
[im 12/23]
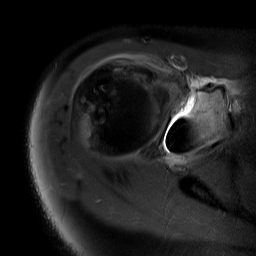
[im 15/23]
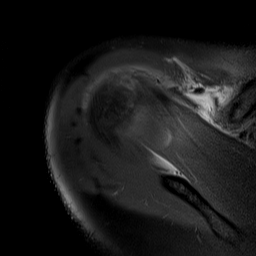
[im 19/23]
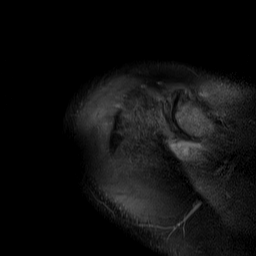
[im 23/23]
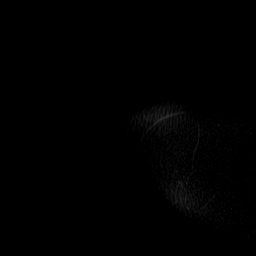

[Series 4: T2 fat-sat · oblique · 4.0mm · 0.59mm/px · 6 of 22 slices shown (1 of 2)]
[im 1/22]
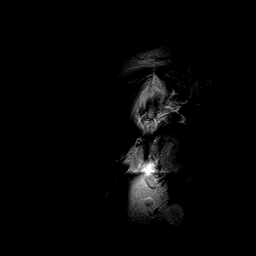
[im 5/22]
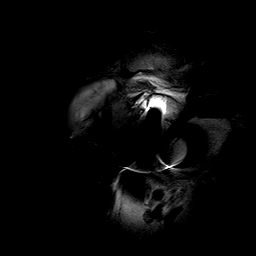
[im 9/22]
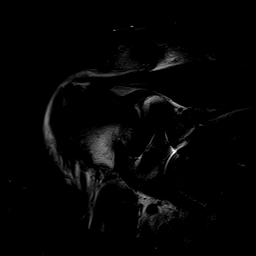
[im 13/22]
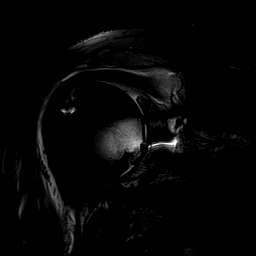
[im 17/22]
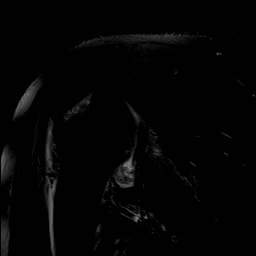
[im 22/22]
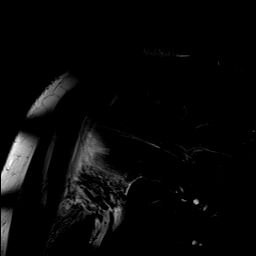

[Series 5: PD fat-sat · oblique · 4.0mm · 0.59mm/px · 6 of 22 slices shown (2 of 2)]
[im 1/22]
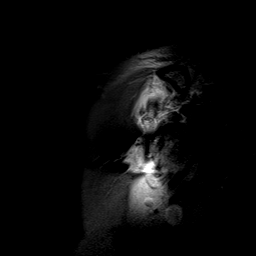
[im 5/22]
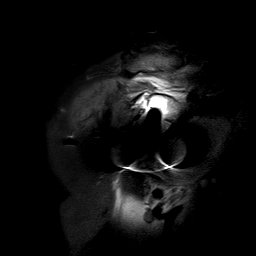
[im 9/22]
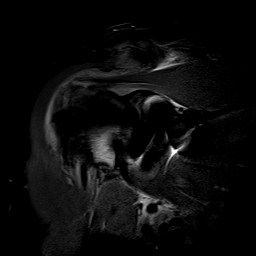
[im 13/22]
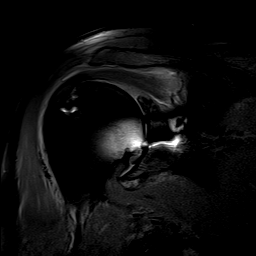
[im 17/22]
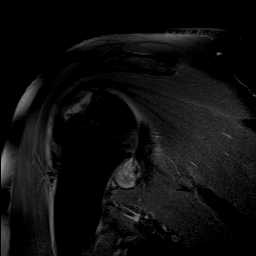
[im 22/22]
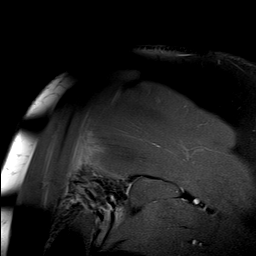

[Series 6: T1 · oblique · 4.0mm · 0.59mm/px · 7 of 23 slices shown]
[im 1/23]
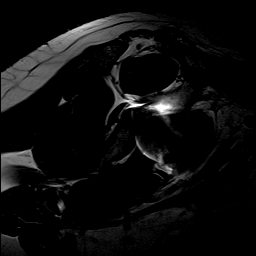
[im 4/23]
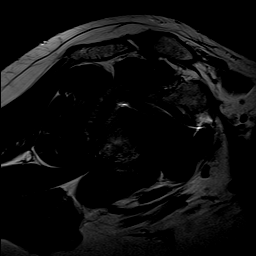
[im 8/23]
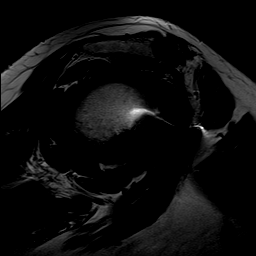
[im 12/23]
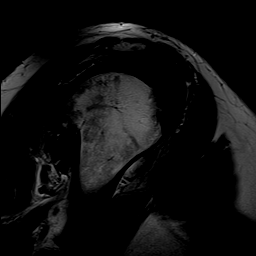
[im 15/23]
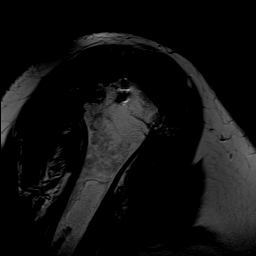
[im 19/23]
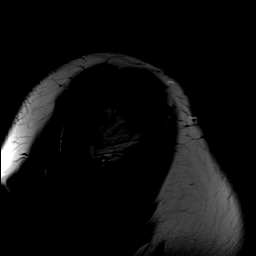
[im 23/23]
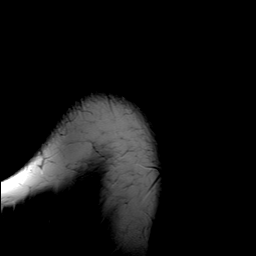

[Series 7: T2 fat-sat · oblique · 4.0mm · 0.59mm/px · 7 of 23 slices shown (2 of 2)]
[im 1/23]
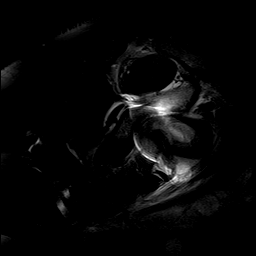
[im 4/23]
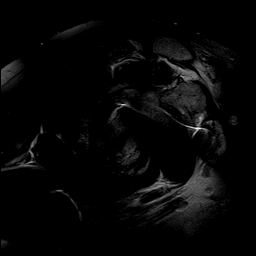
[im 8/23]
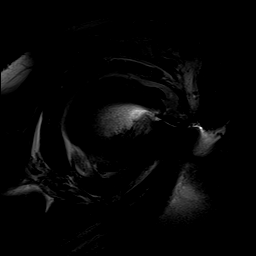
[im 12/23]
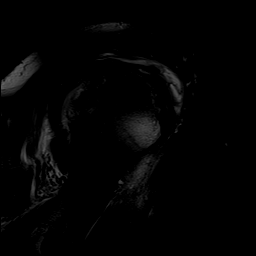
[im 15/23]
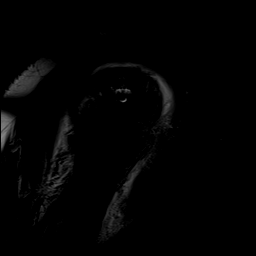
[im 19/23]
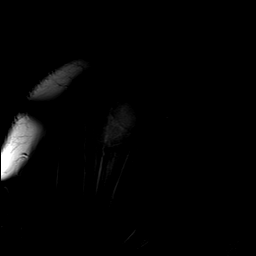
[im 23/23]
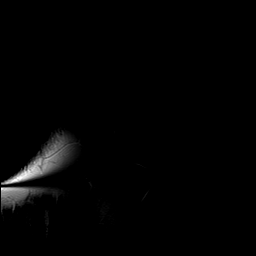

[Series 8: STIR · oblique · 4.0mm · 0.29mm/px · 7 of 23 slices shown]
[im 1/23]
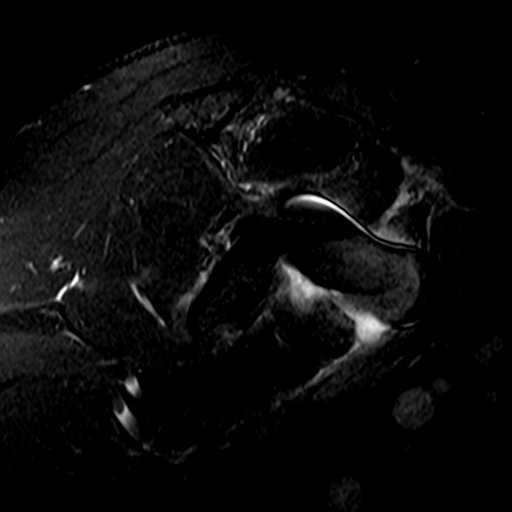
[im 4/23]
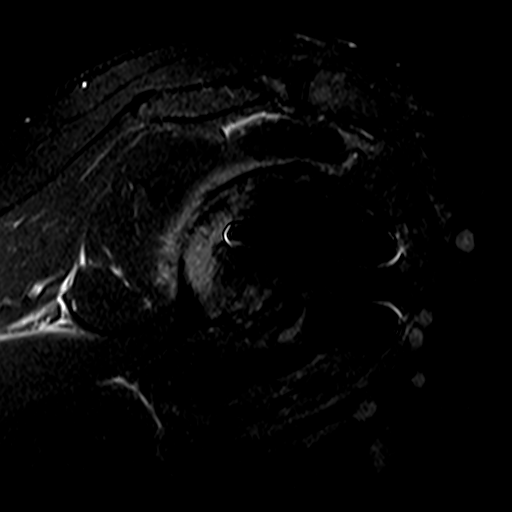
[im 8/23]
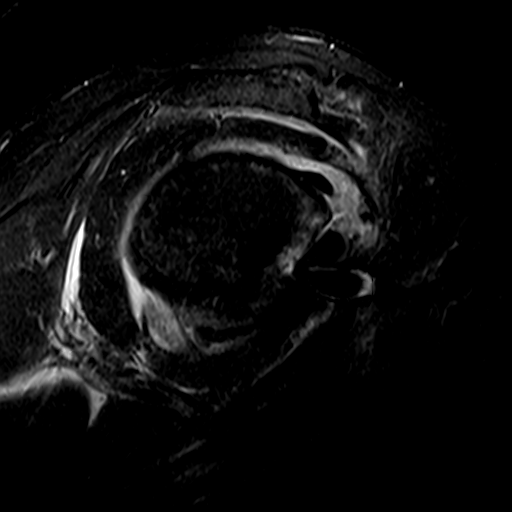
[im 12/23]
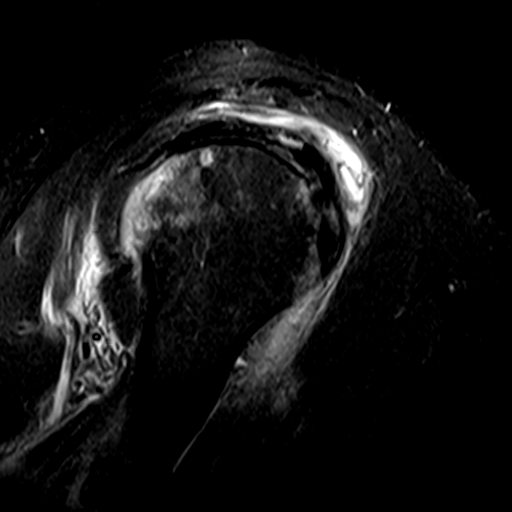
[im 15/23]
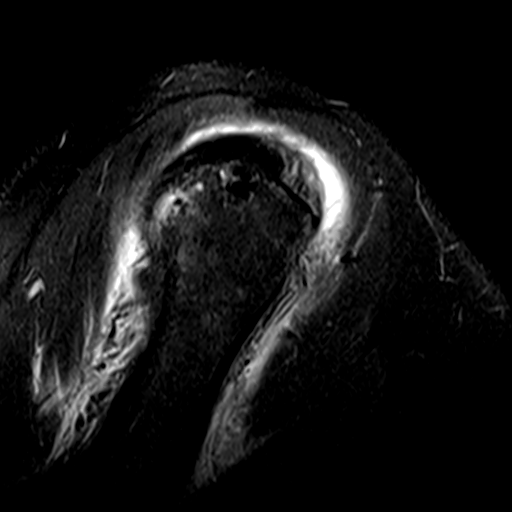
[im 19/23]
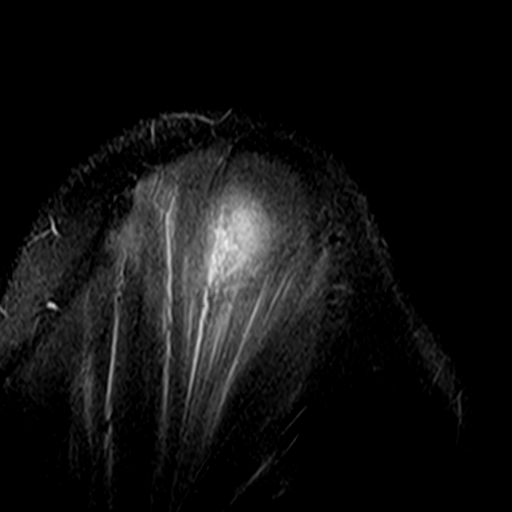
[im 23/23]
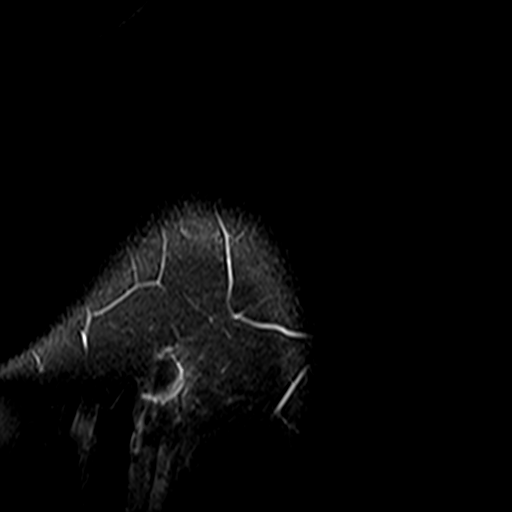

[40 of 40 positions shown; findings below may reference images not displayed]

FINDINGS: Fairly extensive artifact is present on the examination due to the
presence surgical hardware in the anterior aspect of the shoulder.
Exact nature of the hardware can not be determined.

Rotator cuff: Artifact in the greater tuberosity is most consistent
with prior rotator cuff surgery. Subscapularis is obscured. There is
thickening and intermediate increased T2 signal in the supraspinatus
and infraspinatus consistent with tendinopathy, worse in the
supraspinatus. No tear.

Muscles: The subscapularis muscle belly is obscured. Musculature is
otherwise normal appearance.

Biceps long head:  Intact.

Acromioclavicular Joint: Moderate degenerative change is present.
Type 2 acromion. Moderately large volume of fluid and debris are
seen in the subacromial/subdeltoid bursa.

Glenohumeral Joint: The patient has advanced glenohumeral
osteoarthritis with complete denuding of cartilage, remodeling of
the glenoid and an osteophyte off the humeral head.

Labrum: The anterior labrum is obscured. No labral tear is seen. The
superior labrum is degenerated.

Bones:  No fracture or worrisome lesion.

Other: None.
IMPRESSION: Artifact from surgical hardware somewhat limits the study.

Dominant finding is advanced glenohumeral osteoarthritis.

Supraspinatus worse than infraspinatus tendinopathy without tear.
The patient appears to be status post rotator cuff repair.

Mild to moderate acromioclavicular osteoarthritis.

Fluid and debris in the subacromial/subdeltoid bursa consistent with
bursitis.

## 2020-10-27 ENCOUNTER — Ambulatory Visit (INDEPENDENT_AMBULATORY_CARE_PROVIDER_SITE_OTHER): Payer: 59 | Admitting: Family Medicine

## 2020-10-27 ENCOUNTER — Encounter: Payer: Self-pay | Admitting: Family Medicine

## 2020-10-27 ENCOUNTER — Other Ambulatory Visit: Payer: Self-pay

## 2020-10-27 VITALS — BP 136/80 | HR 72 | Ht 71.0 in | Wt 247.0 lb

## 2020-10-27 DIAGNOSIS — H0015 Chalazion left lower eyelid: Secondary | ICD-10-CM

## 2020-10-27 DIAGNOSIS — Z23 Encounter for immunization: Secondary | ICD-10-CM

## 2020-10-27 MED ORDER — SULFACETAMIDE-PREDNISOLONE 10-0.2 % OP OINT: 1.0000 "application " | TOPICAL_OINTMENT | Freq: Four times a day (QID) | OPHTHALMIC | 0 refills | Status: DC

## 2020-10-27 NOTE — Patient Instructions (Signed)
Chalazion A chalazion is a swelling or lump on the eyelid. It can affect the upper eyelid or the lower eyelid. What are the causes? This condition may be caused by: Long-lasting (chronic) inflammation of the eyelid glands. A blocked oil gland in the eyelid. What are the signs or symptoms? Symptoms of this condition include: Swelling of the eyelid that: May spread to areas around the eye. May be painful. A hard lump on the eyelid. Blurry vision. The lump may make it hard to see out of the eye. How is this diagnosed? This condition is diagnosed with an examination of the eye. How is this treated? This condition is treated by applying a warm, moist cloth (warm compress) to the eyelid. If the condition does not improve, it may be treated with: Medicine that is applied to the eye. Oral medicines. Medicine that is injected into the chalazion. Surgery. Follow these instructions at home: Managing pain and swelling Apply a warm compress to the eyelid for 10-15 minutes, 4 to 6 times a day. This will help to open any blocked glands and to reduce redness and swelling. Take and apply over-the-counter and prescription medicines only as told by your health care provider. General instructions Do not touch the chalazion. Do not try to remove the pus. Do not squeeze the chalazion or stick it with a pin or needle. Do not rub your eyes. Wash your hands often with soap and water for at least 20 seconds. Dry your hands with a clean towel. Keep your face, scalp, and eyebrows clean. Avoid wearing eye makeup. Keep all follow-up visits. This is important. Contact a health care provider if: Your eyelid is getting worse. You have a fever. The chalazion does not break open (rupture) or go away on its own and your eyelid has not improved for 4 weeks. Get help right away if: You have pain in your eye. Your vision worsens. The chalazion becomes painful or red. The chalazion gets bigger. Summary A  chalazion is a swelling or lump on the upper or lower eyelid. It may be caused by chronic inflammation or a blocked oil gland. Apply a warm compress to the eyelid for 10-15 minutes, 4 to 6 times a day. Keep your face, scalp, and eyebrows clean. This information is not intended to replace advice given to you by your health care provider. Make sure you discuss any questions you have with your health care provider. Document Revised: 04/06/2020 Document Reviewed: 04/06/2020 Elsevier Patient Education  [place].

## 2020-10-27 NOTE — Progress Notes (Addendum)
Date:  10/27/2020   Name:  Tyler Glover   DOB:  1960-08-14   MRN:  [id number]   Chief Complaint: Eye Problem  Eye Problem  The left eye is affected. This is a new problem. The current episode started in the past 7 days. The problem occurs constantly. The problem has been gradually improving. There was no injury mechanism. The pain is mild. There is No known exposure to pink eye. He Does not wear contacts. Associated symptoms include eye redness. Pertinent negatives include no blurred vision, eye discharge, double vision, fever, foreign body sensation, itching, nausea, photophobia, recent URI or vomiting. He has tried eye drops for the symptoms. The treatment provided mild relief.   Lab Results  Component Value Date   CREATININE 0.97 09/05/2020   BUN 13 09/05/2020   NA 140 09/05/2020   K 4.4 09/05/2020   CL 105 09/05/2020   CO2 22 09/05/2020   Lab Results  Component Value Date   CHOL 224 (H) 09/05/2020   HDL 42 09/05/2020   LDLCALC 132 (H) 09/05/2020   TRIG 281 (H) 09/05/2020   CHOLHDL 4.6 08/22/2017   No results found for: TSH Lab Results  Component Value Date   HGBA1C 5.3 06/30/2019   No results found for: WBC, HGB, HCT, MCV, PLT Lab Results  Component Value Date   ALT 26 06/30/2019   AST 25 06/30/2019   ALKPHOS 66 06/30/2019   BILITOT 0.6 06/30/2019     Review of Systems  Constitutional:  Negative for fever.  Eyes:  Positive for redness. Negative for blurred vision, double vision, photophobia, discharge and itching.  Gastrointestinal:  Negative for nausea and vomiting.   Patient Active Problem List   Diagnosis Date Noted   Encounter for screening colonoscopy    Polyp of transverse colon    Heartburn    Pure hypercholesterolemia 01/26/2016   Right lateral epicondylitis 07/07/2015   Vitamin D deficiency 12/24/2014   Prediabetes 12/24/2014   Allergic rhinitis, seasonal 06/03/2014   Acid reflux 06/03/2014   Essential (primary) hypertension 06/03/2014    Family history of diabetes mellitus 06/03/2014   Family history of cardiac disorder 06/03/2014   Obesity (BMI 30.0-34.9) 06/03/2014    Allergies  Allergen Reactions   Penicillins     Told as child not to do injectable, but has taken Amoxicillin since Unknown    Past Surgical History:  Procedure Laterality Date   COLONOSCOPY     COLONOSCOPY WITH PROPOFOL N/A 08/21/2019   Procedure: COLONOSCOPY WITH PROPOFOL;  Surgeon: Lucilla Lame, MD;  Location: [place];  Service: Endoscopy;  Laterality: N/A;  priority 4   ESOPHAGOGASTRODUODENOSCOPY (EGD) WITH PROPOFOL N/A 08/21/2019   Procedure: ESOPHAGOGASTRODUODENOSCOPY (EGD) WITH PROPOFOL;  Surgeon: Lucilla Lame, MD;  Location: [place];  Service: Endoscopy;  Laterality: N/A;   POLYPECTOMY  08/21/2019   Procedure: POLYPECTOMY;  Surgeon: Lucilla Lame, MD;  Location: [place];  Service: Endoscopy;;   SHOULDER SURGERY     TESTICLE SURGERY     age 46 or 40   TONSILLECTOMY      Social History   Tobacco Use   Smoking status: Former    Types: Cigarettes    Quit date: 1993    Years since quitting: 29.7   Smokeless tobacco: Never  Vaping Use   Vaping Use: Never used  Substance Use Topics   Alcohol use: Not Currently    Alcohol/week: 0.0 standard drinks   Drug use: No  Medication list has been reviewed and updated.  Current Meds  Medication Sig   acetaminophen (TYLENOL) 325 MG tablet Take by mouth.   atorvastatin (LIPITOR) 10 MG tablet Take 1 tablet (10 mg total) by mouth daily.   bismuth subsalicylate (PEPTO BISMOL) 262 MG/15ML suspension Take 30 mLs by mouth every 6 (six) hours as needed for indigestion.   calcium carbonate (OS-CAL) 600 MG tablet Take 600 mg by mouth daily.   Certolizumab Pegol 2 X 200 MG/ML PSKT Inject into the skin.   Cholecalciferol (VITAMIN D) 2000 units CAPS Take 1 capsule (2,000 Units total) by mouth daily.   Cyanocobalamin 1000 MCG CAPS Take 1 capsule by mouth daily.    diclofenac sodium (VOLTAREN) 1 % GEL Apply topically. otc   fluticasone (FLONASE) 50 MCG/ACT nasal spray USE 1 SPRAY IN EACH NOSTRIL DAILY   folic acid (FOLVITE) 1 MG tablet Take 1 tablet by mouth daily. patel   hydroxychloroquine (PLAQUENIL) 200 MG tablet Take 1 tablet by mouth 2 (two) times daily. Dr patel   ibuprofen (ADVIL) 200 MG tablet Take 1 tablet by mouth as needed.   lisinopril (ZESTRIL) 40 MG tablet Take 1 tablet (40 mg total) by mouth daily.   meloxicam (MOBIC) 7.5 MG tablet TAKE 1 TABLET(7.5 MG) BY MOUTH DAILY   methotrexate (RHEUMATREX) 2.5 MG tablet Take 5 tablets by mouth once a week. patel   montelukast (SINGULAIR) 10 MG tablet Take 1 tablet (10 mg total) by mouth at bedtime.   Multiple Vitamin (MULTIVITAMIN) capsule Take 1 capsule by mouth daily.   OVER THE COUNTER MEDICATION daily. Total Brain Dietary Supplement   pantoprazole (PROTONIX) 40 MG tablet Take 1 tablet (40 mg total) by mouth daily.   pseudoephedrine (WAL-PHED 12 HOUR) 120 MG 12 hr tablet TAKE 1 TABLET BY MOUTH EVERY 12 HOURS AS NEEDED FOR CONGESTION   tadalafil (CIALIS) 20 MG tablet TAKE  1/2 TO 1 TABLET BY MOUTH EVERY OTHER DAY AS NEEDED FOR ERECTILE DYSFUNCTION   Testosterone (ANDROGEL PUMP) 20.25 MG/ACT (1.62%) GEL Place 1 Pump onto the skin daily. Apply 1 pump to the forearm daily   Turmeric (RA TURMERIC EXTRA STRENGTH) 1053 MG TABS Take by mouth.   vitamin E 180 MG (400 UNITS) capsule Take 400 Units by mouth daily.   [DISCONTINUED] Certolizumab Pegol 2 X 200 MG/ML KIT Patel    [facility] 2/9 Scores 09/05/2020 07/04/2020 09/03/2019 06/30/2019  PHQ - 2 Score 0 0 0 0  PHQ- 9 Score 0 0 0 0    GAD 7 : Generalized Anxiety Score 09/05/2020 07/04/2020 06/30/2019  Nervous, Anxious, on Edge 0 0 0  Control/stop worrying 0 0 0  Worry too much - different things 0 0 0  Trouble relaxing 0 0 0  Restless 0 0 0  Easily annoyed or irritable 0 0 0  Afraid - awful might happen 0 0 0  Total GAD 7 Score 0 0 0    BP Readings from  Last 3 Encounters:  09/05/20 122/80  07/04/20 128/72  05/15/20 (!) 139/99    Physical Exam  Wt Readings from Last 3 Encounters:  10/27/20 247 lb (112 kg)  09/05/20 247 lb (112 kg)  07/04/20 248 lb (112.5 kg)    Ht _0  (1.803 m)   Wt 247 lb (112 kg)   BMI 34.45 kg/m   Assessment and Plan:  1. Chalazion left lower eyelid Acute. Persistent. Apply Blephmide q 6 hrs - sulfacetaminde-prednisoLONE (BLEPHAMIDE) ophthalmic ointment; Place 1 application into the left eye  4 (four) times daily.  Dispense: 3.5 g; Refill: 0  2. Need for immunization against influenza Discussed and administered - Flu Vaccine QUAD 49moIM (Fluarix, Fluzone & Alfiuria Quad PF)   Dragon not working

## 2020-10-28 ENCOUNTER — Other Ambulatory Visit: Payer: Self-pay

## 2020-10-28 ENCOUNTER — Telehealth: Payer: Self-pay | Admitting: Family Medicine

## 2020-10-28 DIAGNOSIS — H0015 Chalazion left lower eyelid: Secondary | ICD-10-CM

## 2020-10-28 MED ORDER — SULFACETAMIDE-PREDNISOLONE 10-0.23 % OP SOLN: 1.0000 [drp] | OPHTHALMIC | 0 refills | Status: DC

## 2020-10-28 NOTE — Telephone Encounter (Signed)
Pt states the sulfacetaminde-prednisoLONE [facility]) ophthalmic ointment  Is not available. Pt has tried 3 pharmacies and no one had this med.  No one can even order this medication. Please advise  [facility] [place], [address] AT [place]

## 2020-10-29 ENCOUNTER — Other Ambulatory Visit: Payer: Self-pay | Admitting: Family Medicine

## 2020-10-29 DIAGNOSIS — E7800 Pure hypercholesterolemia, unspecified: Secondary | ICD-10-CM

## 2020-10-29 NOTE — Telephone Encounter (Signed)
Requested medication (s) are due for refill today: yes  Requested medication (s) are on the active medication list: yes  Last refill:  09/06/20  Future visit scheduled: yes  Notes to clinic:  pt was due to f/u in 6-8 weeks did not make appt- next appt is in 4 months   Requested Prescriptions  Pending Prescriptions Disp Refills   atorvastatin (LIPITOR) 10 MG tablet [Pharmacy Med Name: ATORVASTATIN '10MG'$  TABLETS] 30 tablet 1    Sig: TAKE 1 TABLET(10 MG) BY MOUTH DAILY     Cardiovascular:  Antilipid - Statins Failed - 10/29/2020  3:33 AM      Failed - Total Cholesterol in normal range and within 360 days    Cholesterol, Total  Date Value Ref Range Status  09/05/2020 224 (H) 100 - 199 mg/dL Final          Failed - LDL in normal range and within 360 days    LDL Chol Calc (NIH)  Date Value Ref Range Status  09/05/2020 132 (H) 0 - 99 mg/dL Final          Failed - Triglycerides in normal range and within 360 days    Triglycerides  Date Value Ref Range Status  09/05/2020 281 (H) 0 - 149 mg/dL Final          Passed - HDL in normal range and within 360 days    HDL  Date Value Ref Range Status  09/05/2020 42 >39 mg/dL Final          Passed - Patient is not pregnant      Passed - Valid encounter within last 12 months    Recent Outpatient Visits           2 days ago Chalazion left lower eyelid   [facility] Juline Patch, MD   1 month ago Moderate mixed hyperlipidemia not requiring statin therapy   [facility] Juline Patch, MD   3 months ago Eustachian tube dysfunction, bilateral   [facility] Juline Patch, MD   7 months ago Essential (primary) hypertension   [facility] Juline Patch, MD   10 months ago Acute non-recurrent sinusitis, unspecified location   [facility] Juline Patch, MD       Future Appointments             In 4 months Juline Patch, MD [facility], [facility]

## 2020-11-08 ENCOUNTER — Other Ambulatory Visit: Payer: Self-pay | Admitting: Family Medicine

## 2020-11-08 DIAGNOSIS — M26649 Arthritis of unspecified temporomandibular joint: Secondary | ICD-10-CM

## 2020-11-09 ENCOUNTER — Other Ambulatory Visit: Payer: Self-pay | Admitting: Family Medicine

## 2020-11-09 DIAGNOSIS — H6983 Other specified disorders of Eustachian tube, bilateral: Secondary | ICD-10-CM

## 2020-12-03 ENCOUNTER — Other Ambulatory Visit: Payer: Self-pay | Admitting: Family Medicine

## 2020-12-03 DIAGNOSIS — E7800 Pure hypercholesterolemia, unspecified: Secondary | ICD-10-CM

## 2020-12-03 NOTE — Telephone Encounter (Signed)
Requested Prescriptions  Pending Prescriptions Disp Refills  . atorvastatin (LIPITOR) 10 MG tablet [Pharmacy Med Name: ATORVASTATIN 10MG  TABLETS] 90 tablet 0    Sig: TAKE 1 TABLET(10 MG) BY MOUTH DAILY     Cardiovascular:  Antilipid - Statins Failed - 12/03/2020  3:34 AM      Failed - Total Cholesterol in normal range and within 360 days    Cholesterol, Total  Date Value Ref Range Status  09/05/2020 224 (H) 100 - 199 mg/dL Final         Failed - LDL in normal range and within 360 days    LDL Chol Calc (NIH)  Date Value Ref Range Status  09/05/2020 132 (H) 0 - 99 mg/dL Final         Failed - Triglycerides in normal range and within 360 days    Triglycerides  Date Value Ref Range Status  09/05/2020 281 (H) 0 - 149 mg/dL Final         Passed - HDL in normal range and within 360 days    HDL  Date Value Ref Range Status  09/05/2020 42 >39 mg/dL Final         Passed - Patient is not pregnant      Passed - Valid encounter within last 12 months    Recent Outpatient Visits          1 month ago Chalazion left lower eyelid   [place], Deanna C, MD   2 months ago Moderate mixed hyperlipidemia not requiring statin therapy   [facility] Juline Patch, MD   5 months ago Eustachian tube dysfunction, bilateral   [facility] Juline Patch, MD   9 months ago Essential (primary) hypertension   [facility] Juline Patch, MD   12 months ago Acute non-recurrent sinusitis, unspecified location   [facility] Juline Patch, MD      Future Appointments            In 2 months Juline Patch, MD [facility], [facility]

## 2020-12-11 ENCOUNTER — Other Ambulatory Visit: Payer: Self-pay | Admitting: Family Medicine

## 2020-12-11 DIAGNOSIS — M26649 Arthritis of unspecified temporomandibular joint: Secondary | ICD-10-CM

## 2020-12-11 NOTE — Telephone Encounter (Signed)
Requested medication (s) are due for refill today: yes  Requested medication (s) are on the active medication list: yes  Last refill:  11/08/20 #30  Future visit scheduled: yes  Notes to clinic:  overdue lab work   Requested Prescriptions  Pending Prescriptions Disp Refills   meloxicam ([place]) 7.5 MG tablet [Pharmacy Med Name: MELOXICAM 7.5MG  TABLETS] 30 tablet 0    Sig: TAKE 1 TABLET(7.5 MG) BY MOUTH DAILY     Analgesics:  COX2 Inhibitors Failed - 12/11/2020  3:34 AM      Failed - HGB in normal range and within 360 days    No results found for: HGB, HGBKUC, HGBPOCKUC, HGBOTHER, TOTHGB, HGBPLASMA        Passed - Cr in normal range and within 360 days    Creatinine, Ser  Date Value Ref Range Status  09/05/2020 0.97 0.76 - 1.27 mg/dL Final          Passed - Patient is not pregnant      Passed - Valid encounter within last 12 months    Recent Outpatient Visits           1 month ago Chalazion left lower eyelid   [facility] Juline Patch, MD   3 months ago Moderate mixed hyperlipidemia not requiring statin therapy   [facility] Juline Patch, MD   5 months ago Eustachian tube dysfunction, bilateral   [facility] Juline Patch, MD   9 months ago Essential (primary) hypertension   [facility] Juline Patch, MD   1 year ago Acute non-recurrent sinusitis, unspecified location   [facility] Juline Patch, MD       Future Appointments             In 2 months Juline Patch, MD [facility], [facility]

## 2021-02-07 ENCOUNTER — Encounter: Payer: Self-pay | Admitting: Family Medicine

## 2021-02-07 ENCOUNTER — Ambulatory Visit (INDEPENDENT_AMBULATORY_CARE_PROVIDER_SITE_OTHER): Payer: 59 | Admitting: Family Medicine

## 2021-02-07 ENCOUNTER — Other Ambulatory Visit: Payer: Self-pay

## 2021-02-07 DIAGNOSIS — R7989 Other specified abnormal findings of blood chemistry: Secondary | ICD-10-CM

## 2021-02-07 DIAGNOSIS — K219 Gastro-esophageal reflux disease without esophagitis: Secondary | ICD-10-CM

## 2021-02-07 DIAGNOSIS — H6983 Other specified disorders of Eustachian tube, bilateral: Secondary | ICD-10-CM | POA: Diagnosis not present

## 2021-02-07 DIAGNOSIS — E7800 Pure hypercholesterolemia, unspecified: Secondary | ICD-10-CM | POA: Diagnosis not present

## 2021-02-07 DIAGNOSIS — I10 Essential (primary) hypertension: Secondary | ICD-10-CM | POA: Diagnosis not present

## 2021-02-07 DIAGNOSIS — J301 Allergic rhinitis due to pollen: Secondary | ICD-10-CM

## 2021-02-07 DIAGNOSIS — M26649 Arthritis of unspecified temporomandibular joint: Secondary | ICD-10-CM

## 2021-02-07 DIAGNOSIS — N529 Male erectile dysfunction, unspecified: Secondary | ICD-10-CM

## 2021-02-07 MED ORDER — PSEUDOEPHEDRINE HCL ER 120 MG PO TB12: ORAL_TABLET | ORAL | 11 refills | Status: DC

## 2021-02-07 MED ORDER — TESTOSTERONE 20.25 MG/ACT (1.62%) TD GEL: 1.0000 | Freq: Every day | TRANSDERMAL | 5 refills | Status: DC

## 2021-02-07 MED ORDER — LISINOPRIL 40 MG PO TABS: 40.0000 mg | ORAL_TABLET | Freq: Every day | ORAL | 1 refills | Status: DC

## 2021-02-07 MED ORDER — TADALAFIL 20 MG PO TABS: ORAL_TABLET | ORAL | 5 refills | Status: DC

## 2021-02-07 MED ORDER — PANTOPRAZOLE SODIUM 40 MG PO TBEC: 40.0000 mg | DELAYED_RELEASE_TABLET | Freq: Every day | ORAL | 1 refills | Status: DC

## 2021-02-07 MED ORDER — ATORVASTATIN CALCIUM 10 MG PO TABS: ORAL_TABLET | ORAL | 1 refills | Status: DC

## 2021-02-07 MED ORDER — MELOXICAM 7.5 MG PO TABS: ORAL_TABLET | ORAL | 5 refills | Status: DC

## 2021-02-07 MED ORDER — FLUTICASONE PROPIONATE 50 MCG/ACT NA SUSP: NASAL | 11 refills | Status: DC

## 2021-02-07 MED ORDER — MONTELUKAST SODIUM 10 MG PO TABS: ORAL_TABLET | ORAL | 3 refills | Status: DC

## 2021-02-07 NOTE — Progress Notes (Signed)
Date:  02/07/2021   Name:  Tyler Glover   DOB:  June 07, 1960   MRN:  [id number]   Chief Complaint: Erectile Dysfunction, Gastroesophageal Reflux, Allergic Rhinitis , Hypertension, and Hyperlipidemia  Erectile Dysfunction This is a chronic problem. The current episode started more than 1 year ago. The problem has been gradually improving since onset. The nature of his difficulty is maintaining erection. He reports no anxiety, decreased libido or performance anxiety. Irritative symptoms do not include frequency, nocturia or urgency. Obstructive symptoms do not include dribbling, incomplete emptying, an intermittent stream, a slower stream, straining or a weak stream. Pertinent negatives include no chills, dysuria, genital pain, hematuria, hesitancy or inability to urinate. Nothing aggravates the symptoms. The treatment provided moderate relief.  Gastroesophageal Reflux He complains of heartburn. He reports no abdominal pain, no belching, no chest pain, no choking, no coughing, no dysphagia, no nausea or no wheezing. This is a chronic problem. The problem has been waxing and waning. The symptoms are aggravated by medications. Pertinent negatives include no anemia, fatigue, melena, muscle weakness, orthopnea or weight loss. He has tried a PPI for the symptoms. The treatment provided mild relief.  Hypertension This is a chronic problem. The current episode started more than 1 year ago. The problem is controlled. Pertinent negatives include no chest pain, orthopnea, PND or shortness of breath. Past treatments include ACE inhibitors. The current treatment provides moderate improvement. There are no compliance problems.  There is no history of angina, kidney disease, CAD/MI, CVA, heart failure, left ventricular hypertrophy, PVD or retinopathy. There is no history of chronic renal disease, a hypertension causing med or renovascular disease.  Hyperlipidemia This is a chronic problem. The problem is  controlled. Recent lipid tests were reviewed and are normal. Exacerbating diseases include obesity. He has no history of chronic renal disease, diabetes or hypothyroidism. Pertinent negatives include no chest pain, focal sensory loss, focal weakness, leg pain, myalgias or shortness of breath. Current antihyperlipidemic treatment includes statins. The current treatment provides moderate improvement of lipids.  URI  This is a recurrent (allergy) problem. The current episode started more than 1 year ago. The problem has been gradually worsening. Associated symptoms include congestion, rhinorrhea and sneezing. Pertinent negatives include no abdominal pain, chest pain, coughing, dysuria, nausea or wheezing.   Lab Results  Component Value Date   NA 140 09/05/2020   K 4.4 09/05/2020   CO2 22 09/05/2020   GLUCOSE 83 09/05/2020   BUN 13 09/05/2020   CREATININE 0.97 09/05/2020   CALCIUM 9.4 09/05/2020   EGFR 91 09/05/2020   GFRNONAA 73 06/30/2019   Lab Results  Component Value Date   CHOL 224 (H) 09/05/2020   HDL 42 09/05/2020   LDLCALC 132 (H) 09/05/2020   TRIG 281 (H) 09/05/2020   CHOLHDL 4.6 08/22/2017   No results found for: TSH Lab Results  Component Value Date   HGBA1C 5.3 06/30/2019   No results found for: WBC, HGB, HCT, MCV, PLT Lab Results  Component Value Date   ALT 26 06/30/2019   AST 25 06/30/2019   ALKPHOS 66 06/30/2019   BILITOT 0.6 06/30/2019   Lab Results  Component Value Date   VD25OH 30.1 07/07/2015     Review of Systems  Constitutional:  Negative for chills, fatigue and weight loss.  HENT:  Positive for congestion, rhinorrhea and sneezing.   Respiratory:  Negative for cough, choking, shortness of breath and wheezing.   Cardiovascular:  Negative for chest pain, orthopnea and PND.  Gastrointestinal:  Positive for heartburn. Negative for abdominal pain, dysphagia, melena and nausea.  Genitourinary:  Negative for decreased libido, dysuria, frequency, hematuria,  hesitancy, incomplete emptying, nocturia and urgency.  Musculoskeletal:  Positive for arthralgias. Negative for back pain, myalgias and muscle weakness.  Neurological:  Positive for weakness. Negative for focal weakness.  Psychiatric/Behavioral:  The patient is not nervous/anxious.    Patient Active Problem List   Diagnosis Date Noted   Encounter for screening colonoscopy    Polyp of transverse colon    Heartburn    Pure hypercholesterolemia 01/26/2016   Right lateral epicondylitis 07/07/2015   Vitamin D deficiency 12/24/2014   Prediabetes 12/24/2014   Allergic rhinitis, seasonal 06/03/2014   Acid reflux 06/03/2014   Essential (primary) hypertension 06/03/2014   Family history of diabetes mellitus 06/03/2014   Family history of cardiac disorder 06/03/2014   Obesity (BMI 30.0-34.9) 06/03/2014    Allergies  Allergen Reactions   Penicillins     Told as child not to do injectable, but has taken Amoxicillin since Unknown    Past Surgical History:  Procedure Laterality Date   COLONOSCOPY     COLONOSCOPY WITH PROPOFOL N/A 08/21/2019   Procedure: COLONOSCOPY WITH PROPOFOL;  Surgeon: Lucilla Lame, MD;  Location: [place];  Service: Endoscopy;  Laterality: N/A;  priority 4   ESOPHAGOGASTRODUODENOSCOPY (EGD) WITH PROPOFOL N/A 08/21/2019   Procedure: ESOPHAGOGASTRODUODENOSCOPY (EGD) WITH PROPOFOL;  Surgeon: Lucilla Lame, MD;  Location: [place];  Service: Endoscopy;  Laterality: N/A;   POLYPECTOMY  08/21/2019   Procedure: POLYPECTOMY;  Surgeon: Lucilla Lame, MD;  [facility];  Service: Endoscopy;;   SHOULDER SURGERY     TESTICLE SURGERY     age 60 or 60   TONSILLECTOMY      Social History   Tobacco Use   Smoking status: Former    Types: Cigarettes    Quit date: 1993    Years since quitting: 30.0   Smokeless tobacco: Never  Vaping Use   Vaping Use: Never used  Substance Use Topics   Alcohol use: Not Currently    Alcohol/week: 0.0 standard  drinks   Drug use: No     Medication list has been reviewed and updated.  Current Meds  Medication Sig   acetaminophen (TYLENOL) 325 MG tablet Take by mouth.   atorvastatin (LIPITOR) 10 MG tablet TAKE 1 TABLET(10 MG) BY MOUTH DAILY   bismuth subsalicylate (PEPTO BISMOL) 262 MG/15ML suspension Take 30 mLs by mouth every 6 (six) hours as needed for indigestion.   calcium carbonate (OS-CAL) 600 MG tablet Take 600 mg by mouth daily.   Certolizumab Pegol 2 X 200 MG/ML PSKT Inject into the skin.   Cholecalciferol (VITAMIN D) 2000 units CAPS Take 1 capsule (2,000 Units total) by mouth daily.   Cyanocobalamin 1000 MCG CAPS Take 1 capsule by mouth daily.   diclofenac sodium (VOLTAREN) 1 % GEL Apply topically. otc   fluticasone (FLONASE) 50 MCG/ACT nasal spray USE 1 SPRAY IN EACH NOSTRIL DAILY   folic acid (FOLVITE) 1 MG tablet Take 1 tablet by mouth daily. patel   hydroxychloroquine (PLAQUENIL) 200 MG tablet Take 1 tablet by mouth 2 (two) times daily. Dr patel   ibuprofen (ADVIL) 200 MG tablet Take 1 tablet by mouth as needed.   lisinopril (ZESTRIL) 40 MG tablet Take 1 tablet (40 mg total) by mouth daily.   meloxicam (MOBIC) 7.5 MG tablet TAKE 1 TABLET(7.5 MG) BY MOUTH DAILY   methotrexate (RHEUMATREX) 2.5 MG tablet  Take 5 tablets by mouth once a week. patel   montelukast (SINGULAIR) 10 MG tablet TAKE 1 TABLET(10 MG) BY MOUTH AT BEDTIME   Multiple Vitamin (MULTIVITAMIN) capsule Take 1 capsule by mouth daily.   OVER THE COUNTER MEDICATION daily. Total Brain Dietary Supplement   pantoprazole (PROTONIX) 40 MG tablet Take 1 tablet (40 mg total) by mouth daily.   pseudoephedrine (WAL-PHED 12 HOUR) 120 MG 12 hr tablet TAKE 1 TABLET BY MOUTH EVERY 12 HOURS AS NEEDED FOR CONGESTION   sulfacetamide-prednisoLONE (VASOCIDIN) 10-0.23 % ophthalmic solution Place 1 drop into the left eye every 3 (three) hours while awake.   tadalafil (CIALIS) 20 MG tablet TAKE  1/2 TO 1 TABLET BY MOUTH EVERY OTHER DAY AS  NEEDED FOR ERECTILE DYSFUNCTION   Testosterone (ANDROGEL PUMP) 20.25 MG/ACT (1.62%) GEL Place 1 Pump onto the skin daily. Apply 1 pump to the forearm daily   Turmeric (RA TURMERIC EXTRA STRENGTH) 1053 MG TABS Take by mouth.   vitamin E 180 MG (400 UNITS) capsule Take 400 Units by mouth daily.    PHQ 2/9 Scores 09/05/2020 07/04/2020 09/03/2019 06/30/2019  PHQ - 2 Score 0 0 0 0  PHQ- 9 Score 0 0 0 0    GAD 7 : Generalized Anxiety Score 09/05/2020 07/04/2020 06/30/2019  Nervous, Anxious, on Edge 0 0 0  Control/stop worrying 0 0 0  Worry too much - different things 0 0 0  Trouble relaxing 0 0 0  Restless 0 0 0  Easily annoyed or irritable 0 0 0  Afraid - awful might happen 0 0 0  Total GAD 7 Score 0 0 0    BP Readings from Last 3 Encounters:  02/07/21 120/80  10/27/20 136/80  09/05/20 122/80    Physical Exam Vitals and nursing note reviewed.  HENT:     Head: Normocephalic.     Right Ear: Tympanic membrane, ear canal and external ear normal. There is no impacted cerumen.     Left Ear: Tympanic membrane, ear canal and external ear normal. There is no impacted cerumen.     Nose: Nose normal. No congestion or rhinorrhea.  Eyes:     General: No scleral icterus.       Right eye: No discharge.        Left eye: No discharge.     Conjunctiva/sclera: Conjunctivae normal.     Pupils: Pupils are equal, round, and reactive to light.  Neck:     Thyroid: No thyromegaly.     Vascular: No JVD.     Trachea: No tracheal deviation.  Cardiovascular:     Rate and Rhythm: Normal rate and regular rhythm.     Heart sounds: Normal heart sounds. No murmur heard.   No friction rub. No gallop.  Pulmonary:     Effort: No respiratory distress.     Breath sounds: Normal breath sounds. No wheezing, rhonchi or rales.  Abdominal:     General: Bowel sounds are normal.     Palpations: Abdomen is soft. There is no mass.     Tenderness: There is no abdominal tenderness. There is no guarding or rebound.   Musculoskeletal:        General: No tenderness. Normal range of motion.     Cervical back: Normal range of motion and neck supple.  Lymphadenopathy:     Cervical: No cervical adenopathy.  Skin:    General: Skin is warm.     Capillary Refill: Capillary refill takes less than 2 seconds.  Coloration: Skin is not jaundiced or pale.     Findings: No bruising, erythema, lesion or rash.  Neurological:     Mental Status: He is alert and oriented to person, place, and time.     Cranial Nerves: No cranial nerve deficit.     Motor: Motor function is intact. No weakness.    Wt Readings from Last 3 Encounters:  02/07/21 242 lb (109.8 kg)  10/27/20 247 lb (112 kg)  09/05/20 247 lb (112 kg)    BP 120/80    Pulse 80    Ht _0  (1.803 m)    Wt 242 lb (109.8 kg)    BMI 33.75 kg/m   Assessment and Plan:

## 2021-02-08 LAB — LIPID PANEL WITH LDL/HDL RATIO
Cholesterol, Total: 144 mg/dL (ref 100–199)
HDL: 47 mg/dL (ref >39)
LDL Chol Calc (NIH): 82 mg/dL (ref 0–99)
LDL/HDL Ratio: 1.7 ratio (ref 0.0–3.6)
Triglycerides: 79 mg/dL (ref 0–149)
VLDL Cholesterol Cal: 15 mg/dL (ref 5–40)

## 2021-02-27 ENCOUNTER — Other Ambulatory Visit: Payer: Self-pay | Admitting: Family Medicine

## 2021-02-27 ENCOUNTER — Ambulatory Visit: Payer: 59 | Admitting: Family Medicine

## 2021-02-27 DIAGNOSIS — I10 Essential (primary) hypertension: Secondary | ICD-10-CM

## 2021-02-27 NOTE — Telephone Encounter (Signed)
Medication was refilled 02/07/2021 #90 with 1 refill at [facility]. Medication should last until 07/2021.  Requested Prescriptions  Pending Prescriptions Disp Refills   lisinopril (ZESTRIL) 40 MG tablet [Pharmacy Med Name: LISINOPRIL 40MG  TABLETS] 90 tablet 1    Sig: TAKE 1 TABLET(40 MG) BY MOUTH DAILY     Cardiovascular:  ACE Inhibitors Passed - 02/27/2021  7:19 PM      Passed - Cr in normal range and within 180 days    Creatinine, Ser  Date Value Ref Range Status  09/05/2020 0.97 0.76 - 1.27 mg/dL Final         Passed - K in normal range and within 180 days    Potassium  Date Value Ref Range Status  09/05/2020 4.4 3.5 - 5.2 mmol/L Final         Passed - Patient is not pregnant      Passed - Last BP in normal range    BP Readings from Last 1 Encounters:  02/07/21 120/80         Passed - Valid encounter within last 6 months    Recent Outpatient Visits          2 weeks ago Hypercholesterolemia   [facility] Juline Patch, MD   4 months ago Chalazion left lower eyelid   [place], [place]   5 months ago Moderate mixed hyperlipidemia not requiring statin therapy   [facility] Juline Patch, MD   7 months ago Eustachian tube dysfunction, bilateral   [facility] Juline Patch, MD   11 months ago Essential (primary) hypertension   [facility] Juline Patch, MD      Future Appointments            In 5 months Juline Patch, MD [facility], [facility]

## 2021-05-19 ENCOUNTER — Encounter: Payer: Self-pay | Admitting: Family Medicine

## 2021-05-19 ENCOUNTER — Ambulatory Visit (INDEPENDENT_AMBULATORY_CARE_PROVIDER_SITE_OTHER): Payer: 59 | Admitting: Family Medicine

## 2021-05-19 VITALS — BP 138/88 | HR 80 | Ht 71.0 in | Wt 214.0 lb

## 2021-05-19 DIAGNOSIS — N529 Male erectile dysfunction, unspecified: Secondary | ICD-10-CM

## 2021-05-19 DIAGNOSIS — Z0000 Encounter for general adult medical examination without abnormal findings: Secondary | ICD-10-CM | POA: Diagnosis not present

## 2021-05-19 DIAGNOSIS — I10 Essential (primary) hypertension: Secondary | ICD-10-CM | POA: Diagnosis not present

## 2021-05-19 DIAGNOSIS — H6983 Other specified disorders of Eustachian tube, bilateral: Secondary | ICD-10-CM

## 2021-05-19 DIAGNOSIS — K219 Gastro-esophageal reflux disease without esophagitis: Secondary | ICD-10-CM | POA: Diagnosis not present

## 2021-05-19 DIAGNOSIS — E7800 Pure hypercholesterolemia, unspecified: Secondary | ICD-10-CM | POA: Diagnosis not present

## 2021-05-19 DIAGNOSIS — R351 Nocturia: Secondary | ICD-10-CM

## 2021-05-19 DIAGNOSIS — Z23 Encounter for immunization: Secondary | ICD-10-CM

## 2021-05-19 DIAGNOSIS — J301 Allergic rhinitis due to pollen: Secondary | ICD-10-CM

## 2021-05-19 DIAGNOSIS — R7303 Prediabetes: Secondary | ICD-10-CM

## 2021-05-19 MED ORDER — TADALAFIL 20 MG PO TABS: ORAL_TABLET | ORAL | 5 refills | Status: DC

## 2021-05-19 NOTE — Progress Notes (Signed)
? ? ?Date:  05/19/2021  ? ?Name:  Tyler Glover   DOB:  Dec 24, 1960   MRN:  [id number] ? ? ?Chief Complaint: Erectile Dysfunction and Annual Exam ? ?Patient is a 61 year old male who presents for a comprehensive physical exam. The patient reports the following problems: none. Health maintenance has been reviewed up to date. ?  ? ?Erectile Dysfunction ?This is a chronic problem. The problem has been gradually improving since onset. The nature of his difficulty is achieving erection. He reports no anxiety. Irritative symptoms do not include frequency or urgency. Pertinent negatives include no chills, dysuria or hematuria. The treatment provided moderate relief.  ? ?Lab Results  ?Component Value Date  ? NA 140 09/05/2020  ? K 4.4 09/05/2020  ? CO2 22 09/05/2020  ? GLUCOSE 83 09/05/2020  ? BUN 13 09/05/2020  ? CREATININE 0.97 09/05/2020  ? CALCIUM 9.4 09/05/2020  ? EGFR 91 09/05/2020  ? GFRNONAA 73 06/30/2019  ? ?Lab Results  ?Component Value Date  ? CHOL 144 02/07/2021  ? HDL 47 02/07/2021  ? Rosine 82 02/07/2021  ? TRIG 79 02/07/2021  ? CHOLHDL 4.6 08/22/2017  ? ?No results found for: TSH ?Lab Results  ?Component Value Date  ? HGBA1C 5.3 06/30/2019  ? ?No results found for: WBC, HGB, HCT, MCV, PLT ?Lab Results  ?Component Value Date  ? ALT 26 06/30/2019  ? AST 25 06/30/2019  ? ALKPHOS 66 06/30/2019  ? BILITOT 0.6 06/30/2019  ? ?Lab Results  ?Component Value Date  ? VD25OH 30.1 07/07/2015  ?  ? ?Review of Systems  ?Constitutional:  Negative for chills and fever.  ?HENT:  Negative for drooling, ear discharge, ear pain and sore throat.   ?Respiratory:  Negative for cough, shortness of breath and wheezing.   ?Cardiovascular:  Negative for chest pain, palpitations and leg swelling.  ?Gastrointestinal:  Negative for abdominal pain, blood in stool, constipation, diarrhea and nausea.  ?Endocrine: Negative for polydipsia.  ?Genitourinary:  Negative for dysuria, frequency, hematuria and urgency.  ?Musculoskeletal:  Negative  for back pain, myalgias and neck pain.  ?Skin:  Negative for rash.  ?Allergic/Immunologic: Negative for environmental allergies.  ?Neurological:  Negative for dizziness and headaches.  ?Hematological:  Does not bruise/bleed easily.  ?Psychiatric/Behavioral:  Negative for suicidal ideas. The patient is not nervous/anxious.   ? ?Patient Active Problem List  ? Diagnosis Date Noted  ? Encounter for screening colonoscopy   ? Polyp of transverse colon   ? Heartburn   ? Pure hypercholesterolemia 01/26/2016  ? Right lateral epicondylitis 07/07/2015  ? Vitamin D deficiency 12/24/2014  ? Prediabetes 12/24/2014  ? Allergic rhinitis, seasonal 06/03/2014  ? Acid reflux 06/03/2014  ? Essential (primary) hypertension 06/03/2014  ? Family history of diabetes mellitus 06/03/2014  ? Family history of cardiac disorder 06/03/2014  ? Obesity (BMI 30.0-34.9) 06/03/2014  ? ? ?Allergies  ?Allergen Reactions  ? Penicillins   ?  Told as child not to do injectable, but has taken Amoxicillin since ?Unknown  ? ? ?Past Surgical History:  ?Procedure Laterality Date  ? COLONOSCOPY    ? COLONOSCOPY WITH PROPOFOL N/A 08/21/2019  ? Procedure: COLONOSCOPY WITH PROPOFOL;  Surgeon: Lucilla Lame, MD;  Location: [facility];  Service: Endoscopy;  Laterality: N/A;  priority 4  ? ESOPHAGOGASTRODUODENOSCOPY (EGD) WITH PROPOFOL N/A 08/21/2019  ? Procedure: ESOPHAGOGASTRODUODENOSCOPY (EGD) WITH PROPOFOL;  Surgeon: Lucilla Lame, MD;  Location: [facility];  Service: Endoscopy;  Laterality: N/A;  ? POLYPECTOMY  08/21/2019  ?  Procedure: POLYPECTOMY;  Surgeon: Lucilla Lame, MD;  Location: [facility];  Service: Endoscopy;;  ? SHOULDER SURGERY    ? TESTICLE SURGERY    ? age 82 or 52  ? TONSILLECTOMY    ? ? ?Social History  ? ?Tobacco Use  ? Smoking status: Former  ?  Types: Cigarettes  ?  Quit date: 54  ?  Years since quitting: 30.2  ? Smokeless tobacco: Never  ?Vaping Use  ? Vaping Use: Never used  ?Substance Use Topics  ? Alcohol use: Not  Currently  ?  Alcohol/week: 0.0 standard drinks  ? Drug use: No  ? ? ? ?Medication list has been reviewed and updated. ? ?Current Meds  ?Medication Sig  ? acetaminophen (TYLENOL) 325 MG tablet Take by mouth.  ? atorvastatin (LIPITOR) 10 MG tablet TAKE 1 TABLET(10 MG) BY MOUTH DAILY  ? bismuth subsalicylate (PEPTO BISMOL) 262 MG/15ML suspension Take 30 mLs by mouth every 6 (six) hours as needed for indigestion.  ? calcium carbonate (OS-CAL) 600 MG tablet Take 600 mg by mouth daily.  ? Certolizumab Pegol 2 X 200 MG/ML PSKT Inject into the skin.  ? Cholecalciferol (VITAMIN D) 2000 units CAPS Take 1 capsule (2,000 Units total) by mouth daily.  ? Cyanocobalamin 1000 MCG CAPS Take 1 capsule by mouth daily.  ? fluticasone (FLONASE) 50 MCG/ACT nasal spray USE 1 SPRAY IN EACH NOSTRIL DAILY  ? folic acid (FOLVITE) 1 MG tablet Take 1 tablet by mouth daily. patel  ? hydroxychloroquine (PLAQUENIL) 200 MG tablet Take 1 tablet by mouth 2 (two) times daily. Dr patel  ? ibuprofen (ADVIL) 200 MG tablet Take 1 tablet by mouth as needed.  ? lisinopril (ZESTRIL) 40 MG tablet TAKE 1 TABLET(40 MG) BY MOUTH DAILY  ? meloxicam (MOBIC) 7.5 MG tablet TAKE 1 TABLET(7.5 MG) BY MOUTH DAILY  ? methotrexate (RHEUMATREX) 2.5 MG tablet Take 7 tablets by mouth once a week. patel  ? montelukast (SINGULAIR) 10 MG tablet TAKE 1 TABLET(10 MG) BY MOUTH AT BEDTIME  ? Multiple Vitamin (MULTIVITAMIN) capsule Take 1 capsule by mouth daily.  ? OVER THE COUNTER MEDICATION daily. Total Brain Dietary Supplement  ? pantoprazole (PROTONIX) 40 MG tablet Take 1 tablet (40 mg total) by mouth daily.  ? pseudoephedrine (WAL-PHED 12 HOUR) 120 MG 12 hr tablet TAKE 1 TABLET BY MOUTH EVERY 12 HOURS AS NEEDED FOR CONGESTION  ? sulfacetamide-prednisoLONE (VASOCIDIN) 10-0.23 % ophthalmic solution Place 1 drop into the left eye every 3 (three) hours while awake.  ? tadalafil (CIALIS) 20 MG tablet TAKE  1/2 TO 1 TABLET BY MOUTH EVERY OTHER DAY AS NEEDED FOR ERECTILE DYSFUNCTION   ? Testosterone (ANDROGEL PUMP) 20.25 MG/ACT (1.62%) GEL Place 1 Pump onto the skin daily. Apply 1 pump to the forearm daily  ? Turmeric (RA TURMERIC EXTRA STRENGTH) 1053 MG TABS Take by mouth.  ? vitamin E 180 MG (400 UNITS) capsule Take 400 Units by mouth daily.  ? ? ? ?  09/05/2020  ?  8:36 AM 07/04/2020  ?  9:24 AM 06/30/2019  ?  8:51 AM  ?GAD 7 : Generalized Anxiety Score  ?Nervous, Anxious, on Edge 0 0 0  ?Control/stop worrying 0 0 0  ?Worry too much - different things 0 0 0  ?Trouble relaxing 0 0 0  ?Restless 0 0 0  ?Easily annoyed or irritable 0 0 0  ?Afraid - awful might happen 0 0 0  ?Total GAD 7 Score 0 0 0  ? ? ? ?  09/05/2020  ?  8:35 AM  ?Depression screen PHQ 2/9  ?Decreased Interest 0  ?Down, Depressed, Hopeless 0  ?PHQ - 2 Score 0  ?Altered sleeping 0  ?Tired, decreased energy 0  ?Change in appetite 0  ?Feeling bad or failure about yourself  0  ?Trouble concentrating 0  ?Moving slowly or fidgety/restless 0  ?Suicidal thoughts 0  ?PHQ-9 Score 0  ? ? ?BP Readings from Last 3 Encounters:  ?05/19/21 138/88  ?02/07/21 120/80  ?10/27/20 136/80  ? ? ?Physical Exam ?Vitals and nursing note reviewed.  ?HENT:  ?   Head: Normocephalic.  ?   Right Ear: Hearing, tympanic membrane, ear canal and external ear normal.  ?   Left Ear: Hearing, tympanic membrane, ear canal and external ear normal.  ?   Nose: Nose normal. No congestion or rhinorrhea.  ?   Mouth/Throat:  ?   Lips: Pink.  ?   Mouth: Mucous membranes are moist.  ?   Dentition: Normal dentition.  ?   Tongue: No lesions.  ?   Palate: No mass.  ?   Pharynx: Oropharynx is clear. Uvula midline.  ?Eyes:  ?   General: Lids are normal. Vision grossly intact. Gaze aligned appropriately. No scleral icterus.    ?   Right eye: No discharge.     ?   Left eye: No discharge.  ?   Extraocular Movements: Extraocular movements intact.  ?   Conjunctiva/sclera: Conjunctivae normal.  ?   Pupils: Pupils are equal, round, and reactive to light.  ?Neck:  ?   Thyroid: No thyroid  mass, thyromegaly or thyroid tenderness.  ?   Vascular: No JVD.  ?   Trachea: Trachea and phonation normal. No tracheal deviation.  ?   Meningeal: Brudzinski's sign and Kernig's sign absent.  ?Cardiovascular:

## 2021-05-20 LAB — LIPID PANEL WITH LDL/HDL RATIO
Cholesterol, Total: 127 mg/dL (ref 100–199)
HDL: 51 mg/dL (ref >39)
LDL Chol Calc (NIH): 61 mg/dL (ref 0–99)
LDL/HDL Ratio: 1.2 ratio (ref 0.0–3.6)
Triglycerides: 73 mg/dL (ref 0–149)
VLDL Cholesterol Cal: 15 mg/dL (ref 5–40)

## 2021-05-20 LAB — COMPREHENSIVE METABOLIC PANEL
ALT: 16 IU/L (ref 0–44)
AST: 21 IU/L (ref 0–40)
Albumin/Globulin Ratio: 2.7 — ABNORMAL HIGH (ref 1.2–2.2)
Albumin: 4.8 g/dL (ref 3.8–4.9)
Alkaline Phosphatase: 83 IU/L (ref 44–121)
BUN/Creatinine Ratio: 19 (ref 9–20)
BUN: 16 mg/dL (ref 6–24)
Bilirubin Total: 0.5 mg/dL (ref 0.0–1.2)
CO2: 21 mmol/L (ref 20–29)
Calcium: 9.7 mg/dL (ref 8.7–10.2)
Chloride: 106 mmol/L (ref 96–106)
Creatinine, Ser: 0.86 mg/dL (ref 0.76–1.27)
Globulin, Total: 1.8 g/dL (ref 1.5–4.5)
Glucose: 88 mg/dL (ref 70–99)
Potassium: 4.4 mmol/L (ref 3.5–5.2)
Sodium: 144 mmol/L (ref 134–144)
Total Protein: 6.6 g/dL (ref 6.0–8.5)
eGFR: 100 mL/min/{1.73_m2} (ref >59)

## 2021-05-20 LAB — HEMOGLOBIN A1C
Est. average glucose Bld gHb Est-mCnc: 111 mg/dL
Hgb A1c MFr Bld: 5.5 % (ref 4.8–5.6)

## 2021-05-20 LAB — PSA: Prostate Specific Ag, Serum: 0.6 ng/mL (ref 0.0–4.0)

## 2021-06-20 ENCOUNTER — Telehealth: Payer: Self-pay

## 2021-06-20 NOTE — Telephone Encounter (Signed)
Copied from [place]. Topic: General - Other ?>> Jun 20, 2021  3:58 PM Alanda Slim E wrote: ?Reason for CRM: Dr. Sharon Mt was reccommended to the pt for a Physiatrist / pt would lie to speak with [name] to get her opinion / please advise ?

## 2021-07-03 ENCOUNTER — Other Ambulatory Visit: Payer: Self-pay

## 2021-07-03 DIAGNOSIS — H6983 Other specified disorders of Eustachian tube, bilateral: Secondary | ICD-10-CM

## 2021-07-03 MED ORDER — MONTELUKAST SODIUM 10 MG PO TABS: ORAL_TABLET | ORAL | 2 refills | Status: DC

## 2021-07-14 ENCOUNTER — Other Ambulatory Visit: Payer: Self-pay | Admitting: Physical Medicine & Rehabilitation

## 2021-07-14 DIAGNOSIS — G8929 Other chronic pain: Secondary | ICD-10-CM

## 2021-07-18 ENCOUNTER — Ambulatory Visit
Admission: RE | Admit: 2021-07-18 | Discharge: 2021-07-18 | Disposition: A | Discharge status: Home / Self Care | Payer: 59 | Source: Ambulatory Visit | Attending: Physical Medicine & Rehabilitation | Admitting: Physical Medicine & Rehabilitation

## 2021-07-18 DIAGNOSIS — G8929 Other chronic pain: Secondary | ICD-10-CM | POA: Insufficient documentation

## 2021-07-18 DIAGNOSIS — M5442 Lumbago with sciatica, left side: Secondary | ICD-10-CM | POA: Diagnosis not present

## 2021-07-24 ENCOUNTER — Other Ambulatory Visit: Payer: Self-pay | Admitting: Physical Medicine & Rehabilitation

## 2021-07-24 DIAGNOSIS — G8929 Other chronic pain: Secondary | ICD-10-CM

## 2021-08-01 ENCOUNTER — Other Ambulatory Visit: Payer: 59

## 2021-08-07 ENCOUNTER — Other Ambulatory Visit: Payer: Self-pay | Admitting: Family Medicine

## 2021-08-07 ENCOUNTER — Other Ambulatory Visit: Payer: 59

## 2021-08-07 ENCOUNTER — Ambulatory Visit
Admission: RE | Admit: 2021-08-07 | Discharge: 2021-08-07 | Disposition: A | Discharge status: Home / Self Care | Payer: 59 | Source: Ambulatory Visit | Attending: Physical Medicine & Rehabilitation | Admitting: Physical Medicine & Rehabilitation

## 2021-08-07 DIAGNOSIS — M546 Pain in thoracic spine: Secondary | ICD-10-CM | POA: Insufficient documentation

## 2021-08-07 DIAGNOSIS — G8929 Other chronic pain: Secondary | ICD-10-CM | POA: Insufficient documentation

## 2021-08-07 DIAGNOSIS — K219 Gastro-esophageal reflux disease without esophagitis: Secondary | ICD-10-CM

## 2021-08-07 DIAGNOSIS — M26649 Arthritis of unspecified temporomandibular joint: Secondary | ICD-10-CM

## 2021-08-08 ENCOUNTER — Ambulatory Visit: Payer: 59 | Admitting: Family Medicine

## 2021-08-16 ENCOUNTER — Telehealth: Payer: Self-pay | Admitting: Family Medicine

## 2021-08-16 NOTE — Telephone Encounter (Signed)
Copied from [place] [id number]. Topic: General - Other >> Aug 16, 2021  2:08 PM Chapman Fitch wrote: Reason for CRM: Pt just had rheumatoid arthritis infusion this week and he was advised to wait 2 weeks before getting his 2 second shingles shot / due to medication having a possible effect with vaccine / pt asked  to let [name] know and he asked if this will mess up his vaccine cycle / please advise

## 2021-08-18 ENCOUNTER — Ambulatory Visit: Payer: 59

## 2021-08-25 ENCOUNTER — Other Ambulatory Visit: Payer: Self-pay

## 2021-08-25 DIAGNOSIS — I10 Essential (primary) hypertension: Secondary | ICD-10-CM

## 2021-08-25 MED ORDER — LISINOPRIL 40 MG PO TABS: ORAL_TABLET | ORAL | 0 refills | Status: DC

## 2021-08-28 ENCOUNTER — Ambulatory Visit (INDEPENDENT_AMBULATORY_CARE_PROVIDER_SITE_OTHER): Payer: 59 | Admitting: Family Medicine

## 2021-08-28 ENCOUNTER — Encounter: Payer: Self-pay | Admitting: Family Medicine

## 2021-08-28 VITALS — BP 130/80 | HR 80 | Ht 71.0 in | Wt 212.0 lb

## 2021-08-28 DIAGNOSIS — J019 Acute sinusitis, unspecified: Secondary | ICD-10-CM

## 2021-08-28 MED ORDER — AZITHROMYCIN 250 MG PO TABS: ORAL_TABLET | ORAL | 0 refills | Status: AC

## 2021-08-28 NOTE — Progress Notes (Signed)
Date:  08/28/2021   Name:  Tyler Glover   DOB:  Jan 17, 1961   MRN:  [id number]   Chief Complaint: Sinusitis (Cong, earache, body aches, cough- some production/ clear )  Sinusitis This is a chronic problem. The current episode started more than 1 year ago. The problem has been gradually improving since onset. There has been no fever. The pain is mild. Associated symptoms include chills, congestion, coughing, a hoarse voice, sinus pressure and a sore throat. Pertinent negatives include no diaphoresis, ear pain, headaches, neck pain, shortness of breath, sneezing or swollen glands. Past treatments include acetaminophen. The treatment provided mild relief.    Lab Results  Component Value Date   NA 144 05/19/2021   K 4.4 05/19/2021   CO2 21 05/19/2021   GLUCOSE 88 05/19/2021   BUN 16 05/19/2021   CREATININE 0.86 05/19/2021   CALCIUM 9.7 05/19/2021   EGFR 100 05/19/2021   GFRNONAA 73 06/30/2019   Lab Results  Component Value Date   CHOL 127 05/19/2021   HDL 51 05/19/2021   LDLCALC 61 05/19/2021   TRIG 73 05/19/2021   CHOLHDL 4.6 08/22/2017   No results found for: "TSH" Lab Results  Component Value Date   HGBA1C 5.5 05/19/2021   No results found for: "WBC", "HGB", "HCT", "MCV", "PLT" Lab Results  Component Value Date   ALT 16 05/19/2021   AST 21 05/19/2021   ALKPHOS 83 05/19/2021   BILITOT 0.5 05/19/2021   Lab Results  Component Value Date   VD25OH 30.1 07/07/2015     Review of Systems  Constitutional:  Positive for chills. Negative for diaphoresis.  HENT:  Positive for congestion, hoarse voice, postnasal drip, rhinorrhea, sinus pressure, sinus pain and sore throat. Negative for ear pain, sneezing and trouble swallowing.   Eyes:  Negative for discharge and itching.  Respiratory:  Positive for cough. Negative for chest tightness, shortness of breath and wheezing.   Musculoskeletal:  Negative for neck pain.  Skin:  Negative for rash.  Neurological:  Negative for  headaches.    Patient Active Problem List   Diagnosis Date Noted   Encounter for screening colonoscopy    Polyp of transverse colon    Heartburn    Pure hypercholesterolemia 01/26/2016   Right lateral epicondylitis 07/07/2015   Vitamin D deficiency 12/24/2014   Prediabetes 12/24/2014   Allergic rhinitis, seasonal 06/03/2014   Acid reflux 06/03/2014   Essential (primary) hypertension 06/03/2014   Family history of diabetes mellitus 06/03/2014   Family history of cardiac disorder 06/03/2014   Obesity (BMI 30.0-34.9) 06/03/2014    Allergies  Allergen Reactions   Penicillins     Told as child not to do injectable, but has taken Amoxicillin since Unknown    Past Surgical History:  Procedure Laterality Date   COLONOSCOPY     COLONOSCOPY WITH PROPOFOL N/A 08/21/2019   Procedure: COLONOSCOPY WITH PROPOFOL;  Surgeon: Lucilla Lame, MD;  Location: [facility];  Service: Endoscopy;  Laterality: N/A;  priority 4   ESOPHAGOGASTRODUODENOSCOPY (EGD) WITH PROPOFOL N/A 08/21/2019   Procedure: ESOPHAGOGASTRODUODENOSCOPY (EGD) WITH PROPOFOL;  Surgeon: Lucilla Lame, MD;  Location: [facility];  Service: Endoscopy;  Laterality: N/A;   POLYPECTOMY  08/21/2019   Procedure: POLYPECTOMY;  Surgeon: Lucilla Lame, MD;  Location: [facility];  Service: Endoscopy;;   SHOULDER SURGERY     TESTICLE SURGERY     age 37 or 29   TONSILLECTOMY      Social History   Tobacco  Use   Smoking status: Former    Types: Cigarettes    Quit date: 1993    Years since quitting: 30.5   Smokeless tobacco: Never  Vaping Use   Vaping Use: Never used  Substance Use Topics   Alcohol use: Not Currently    Alcohol/week: 0.0 standard drinks of alcohol   Drug use: No     Medication list has been reviewed and updated.  Current Meds  Medication Sig   acetaminophen (TYLENOL) 325 MG tablet Take by mouth.   atorvastatin (LIPITOR) 10 MG tablet TAKE 1 TABLET(10 MG) BY MOUTH DAILY   bismuth  subsalicylate (PEPTO BISMOL) 262 MG/15ML suspension Take 30 mLs by mouth every 6 (six) hours as needed for indigestion.   calcium carbonate (OS-CAL) 600 MG tablet Take 600 mg by mouth daily.   Cholecalciferol (VITAMIN D) 2000 units CAPS Take 1 capsule (2,000 Units total) by mouth daily.   Cyanocobalamin 1000 MCG CAPS Take 1 capsule by mouth daily.   fluticasone (FLONASE) 50 MCG/ACT nasal spray USE 1 SPRAY IN EACH NOSTRIL DAILY   folic acid (FOLVITE) 1 MG tablet Take 1 tablet by mouth daily. patel   hydroxychloroquine (PLAQUENIL) 200 MG tablet Take 1 tablet by mouth 2 (two) times daily. Dr patel   ibuprofen (ADVIL) 200 MG tablet Take 1 tablet by mouth as needed.   lisinopril (ZESTRIL) 40 MG tablet TAKE 1 TABLET(40 MG) BY MOUTH DAILY   meloxicam (MOBIC) 7.5 MG tablet TAKE 1 TABLET(7.5 MG) BY MOUTH DAILY   methotrexate (RHEUMATREX) 2.5 MG tablet Take 7 tablets by mouth once a week. patel   montelukast (SINGULAIR) 10 MG tablet TAKE 1 TABLET(10 MG) BY MOUTH AT BEDTIME   Multiple Vitamin (MULTIVITAMIN) capsule Take 1 capsule by mouth daily.   OVER THE COUNTER MEDICATION daily. Total Brain Dietary Supplement   pantoprazole (PROTONIX) 40 MG tablet TAKE 1 TABLET(40 MG) BY MOUTH DAILY   pseudoephedrine (WAL-PHED 12 HOUR) 120 MG 12 hr tablet TAKE 1 TABLET BY MOUTH EVERY 12 HOURS AS NEEDED FOR CONGESTION   tadalafil (CIALIS) 20 MG tablet TAKE  1/2 TO 1 TABLET BY MOUTH EVERY OTHER DAY AS NEEDED FOR ERECTILE DYSFUNCTION   Testosterone (ANDROGEL PUMP) 20.25 MG/ACT (1.62%) GEL Place 1 Pump onto the skin daily. Apply 1 pump to the forearm daily   Turmeric (RA TURMERIC EXTRA STRENGTH) 1053 MG TABS Take by mouth.   vitamin E 180 MG (400 UNITS) capsule Take 400 Units by mouth daily.   [DISCONTINUED] Certolizumab Pegol 2 X 200 MG/ML PSKT Inject into the skin.       08/28/2021   11:42 AM 09/05/2020    8:36 AM 07/04/2020    9:24 AM 06/30/2019    8:51 AM  GAD 7 : Generalized Anxiety Score  Nervous, Anxious, on  Edge 0 0 0 0  Control/stop worrying 0 0 0 0  Worry too much - different things 0 0 0 0  Trouble relaxing 0 0 0 0  Restless 0 0 0 0  Easily annoyed or irritable 0 0 0 0  Afraid - awful might happen 0 0 0 0  Total GAD 7 Score 0 0 0 0  Anxiety Difficulty Not difficult at all          08/28/2021   11:42 AM 09/05/2020    8:35 AM 07/04/2020    9:24 AM  Depression screen PHQ 2/9  Decreased Interest 0 0 0  Down, Depressed, Hopeless 0 0 0  PHQ - 2 Score 0  0 0  Altered sleeping 0 0 0  Tired, decreased energy 0 0 0  Change in appetite 0 0 0  Feeling bad or failure about yourself  0 0 0  Trouble concentrating 0 0 0  Moving slowly or fidgety/restless 0 0 0  Suicidal thoughts 0 0 0  PHQ-9 Score 0 0 0  Difficult doing work/chores Not difficult at all      BP Readings from Last 3 Encounters:  08/28/21 130/80  05/19/21 138/88  02/07/21 120/80    Physical Exam Vitals and nursing note reviewed.  HENT:     Head: Normocephalic.     Right Ear: Tympanic membrane and external ear normal.     Left Ear: Tympanic membrane and external ear normal.     Nose: Congestion present. No rhinorrhea.     Right Sinus: No maxillary sinus tenderness or frontal sinus tenderness.     Left Sinus: No maxillary sinus tenderness or frontal sinus tenderness.     Mouth/Throat:     Mouth: Mucous membranes are moist.     Pharynx: Oropharynx is clear. Uvula midline. No pharyngeal swelling, oropharyngeal exudate, posterior oropharyngeal erythema or uvula swelling.  Eyes:     General: No scleral icterus.       Right eye: No discharge.        Left eye: No discharge.     Conjunctiva/sclera: Conjunctivae normal.     Pupils: Pupils are equal, round, and reactive to light.  Neck:     Thyroid: No thyromegaly.     Vascular: No JVD.     Trachea: No tracheal deviation.  Cardiovascular:     Rate and Rhythm: Normal rate and regular rhythm.     Heart sounds: Normal heart sounds. No murmur heard.    No friction rub. No  gallop.  Pulmonary:     Effort: No respiratory distress.     Breath sounds: Normal breath sounds. No wheezing, rhonchi or rales.  Abdominal:     General: Bowel sounds are normal.     Palpations: Abdomen is soft. There is no mass.     Tenderness: There is no abdominal tenderness. There is no guarding or rebound.  Musculoskeletal:        General: No tenderness. Normal range of motion.     Cervical back: Normal range of motion and neck supple.  Lymphadenopathy:     Cervical: No cervical adenopathy.  Skin:    General: Skin is warm.     Findings: No rash.  Neurological:     Mental Status: He is alert and oriented to person, place, and time.     Cranial Nerves: No cranial nerve deficit.     Deep Tendon Reflexes: Reflexes are normal and symmetric.     Wt Readings from Last 3 Encounters:  08/28/21 212 lb (96.2 kg)  05/19/21 214 lb (97.1 kg)  02/07/21 242 lb (109.8 kg)    BP 130/80   Pulse 80   Ht _0  (1.803 m)   Wt 212 lb (96.2 kg)   BMI 29.57 kg/m   Assessment and Plan:  1. Acute non-recurrent sinusitis, unspecified location Acute.  Persistent.  Stable.  History and examination as suggestive of acute sinusitis.  We will treat with azithromycin to 50 mg 2 today followed by 1 a day for 4 days. - azithromycin (ZITHROMAX) 250 MG tablet; Take 2 tablets on day 1, then 1 tablet daily on days 2 through 5  Dispense: 6 tablet; Refill: 0

## 2021-08-30 ENCOUNTER — Encounter: Payer: Self-pay | Admitting: Family Medicine

## 2021-08-30 ENCOUNTER — Ambulatory Visit (INDEPENDENT_AMBULATORY_CARE_PROVIDER_SITE_OTHER): Payer: 59 | Admitting: Family Medicine

## 2021-08-30 VITALS — BP 122/84 | HR 79 | Temp 98.9°F | Resp 12 | Ht 70.0 in | Wt 212.0 lb

## 2021-08-30 DIAGNOSIS — U071 COVID-19: Secondary | ICD-10-CM | POA: Diagnosis not present

## 2021-08-30 MED ORDER — MOLNUPIRAVIR EUA 200MG CAPSULE: 4.0000 | ORAL_CAPSULE | Freq: Two times a day (BID) | ORAL | 0 refills | Status: AC

## 2021-08-30 NOTE — Progress Notes (Signed)
Date:  08/30/2021   Name:  Tyler Glover   DOB:  13-Feb-1960   MRN:  [id number]   Chief Complaint: Cough  Cough This is a new problem. The current episode started in the past 7 days. The problem has been gradually improving. The cough is Non-productive. Associated symptoms include chills, myalgias, nasal congestion and a sore throat. Pertinent negatives include no fever, shortness of breath or wheezing.    Lab Results  Component Value Date   NA 144 05/19/2021   K 4.4 05/19/2021   CO2 21 05/19/2021   GLUCOSE 88 05/19/2021   BUN 16 05/19/2021   CREATININE 0.86 05/19/2021   CALCIUM 9.7 05/19/2021   EGFR 100 05/19/2021   GFRNONAA 73 06/30/2019   Lab Results  Component Value Date   CHOL 127 05/19/2021   HDL 51 05/19/2021   LDLCALC 61 05/19/2021   TRIG 73 05/19/2021   CHOLHDL 4.6 08/22/2017   No results found for: "TSH" Lab Results  Component Value Date   HGBA1C 5.5 05/19/2021   No results found for: "WBC", "HGB", "HCT", "MCV", "PLT" Lab Results  Component Value Date   ALT 16 05/19/2021   AST 21 05/19/2021   ALKPHOS 83 05/19/2021   BILITOT 0.5 05/19/2021   Lab Results  Component Value Date   VD25OH 30.1 07/07/2015     Review of Systems  Constitutional:  Positive for chills. Negative for fever.  HENT:  Positive for sore throat.   Respiratory:  Positive for cough. Negative for shortness of breath and wheezing.   Musculoskeletal:  Positive for myalgias.    Patient Active Problem List   Diagnosis Date Noted   Encounter for screening colonoscopy    Polyp of transverse colon    Heartburn    Pure hypercholesterolemia 01/26/2016   Right lateral epicondylitis 07/07/2015   Vitamin D deficiency 12/24/2014   Prediabetes 12/24/2014   Allergic rhinitis, seasonal 06/03/2014   Acid reflux 06/03/2014   Essential (primary) hypertension 06/03/2014   Family history of diabetes mellitus 06/03/2014   Family history of cardiac disorder 06/03/2014   Obesity (BMI  30.0-34.9) 06/03/2014    Allergies  Allergen Reactions   Penicillins     Told as child not to do injectable, but has taken Amoxicillin since Unknown    Past Surgical History:  Procedure Laterality Date   COLONOSCOPY     COLONOSCOPY WITH PROPOFOL N/A 08/21/2019   Procedure: COLONOSCOPY WITH PROPOFOL;  Surgeon: Lucilla Lame, MD;  Location: [facility];  Service: Endoscopy;  Laterality: N/A;  priority 4   ESOPHAGOGASTRODUODENOSCOPY (EGD) WITH PROPOFOL N/A 08/21/2019   Procedure: ESOPHAGOGASTRODUODENOSCOPY (EGD) WITH PROPOFOL;  Surgeon: Lucilla Lame, MD;  Location: [facility];  Service: Endoscopy;  Laterality: N/A;   POLYPECTOMY  08/21/2019   Procedure: POLYPECTOMY;  Surgeon: Lucilla Lame, MD;  Location: [facility];  Service: Endoscopy;;   SHOULDER SURGERY     TESTICLE SURGERY     age 65 or 59   TONSILLECTOMY      Social History   Tobacco Use   Smoking status: Former    Types: Cigarettes    Quit date: 1993    Years since quitting: 30.5   Smokeless tobacco: Never  Vaping Use   Vaping Use: Never used  Substance Use Topics   Alcohol use: Not Currently    Alcohol/week: 0.0 standard drinks of alcohol   Drug use: No     Medication list has been reviewed and updated.  No outpatient medications have  been marked as taking for the 08/30/21 encounter (Office Visit) with Juline Patch, MD.       08/28/2021   11:42 AM 09/05/2020    8:36 AM 07/04/2020    9:24 AM 06/30/2019    8:51 AM  GAD 7 : Generalized Anxiety Score  Nervous, Anxious, on Edge 0 0 0 0  Control/stop worrying 0 0 0 0  Worry too much - different things 0 0 0 0  Trouble relaxing 0 0 0 0  Restless 0 0 0 0  Easily annoyed or irritable 0 0 0 0  Afraid - awful might happen 0 0 0 0  Total GAD 7 Score 0 0 0 0  Anxiety Difficulty Not difficult at all          08/28/2021   11:42 AM 09/05/2020    8:35 AM 07/04/2020    9:24 AM  Depression screen PHQ 2/9  Decreased Interest 0 0 0  Down,  Depressed, Hopeless 0 0 0  PHQ - 2 Score 0 0 0  Altered sleeping 0 0 0  Tired, decreased energy 0 0 0  Change in appetite 0 0 0  Feeling bad or failure about yourself  0 0 0  Trouble concentrating 0 0 0  Moving slowly or fidgety/restless 0 0 0  Suicidal thoughts 0 0 0  PHQ-9 Score 0 0 0  Difficult doing work/chores Not difficult at all      BP Readings from Last 3 Encounters:  08/28/21 130/80  05/19/21 138/88  02/07/21 120/80    Physical Exam HENT:     Right Ear: Tympanic membrane normal.     Left Ear: Tympanic membrane normal.     Nose: Nose normal.  Cardiovascular:     Rate and Rhythm: Normal rate and regular rhythm.     Heart sounds: No murmur heard.    No friction rub. No gallop.  Pulmonary:     Breath sounds: No wheezing, rhonchi or rales.     Wt Readings from Last 3 Encounters:  08/28/21 212 lb (96.2 kg)  05/19/21 214 lb (97.1 kg)  02/07/21 242 lb (109.8 kg)    There were no vitals taken for this visit.  Assessment and Plan:   1. COVID New onset.  Persistent.  Patient was seen earlier with a sinus infection but continued to have malaise with decreasing cough and no fever.  Patient's wife tested positive for COVID patient returns today for recheck.  COVID test today is positive.  Patient is on immunosuppressant for rheumatoid arthritis and we will treat with molnupiravir 200 mg 4 tablets twice a day for 5 days.  Patient will use Mucinex DM as needed for cough and isolation has been discussed with patient and spouse. - molnupiravir EUA (LAGEVRIO) 200 mg CAPS capsule; Take 4 capsules (800 mg total) by mouth 2 (two) times daily for 5 days.  Dispense: 40 capsule; Refill: 0

## 2021-09-01 ENCOUNTER — Ambulatory Visit: Payer: 59

## 2021-09-04 ENCOUNTER — Other Ambulatory Visit: Payer: Self-pay | Admitting: Family Medicine

## 2021-09-04 ENCOUNTER — Other Ambulatory Visit: Payer: Self-pay

## 2021-09-04 DIAGNOSIS — M26649 Arthritis of unspecified temporomandibular joint: Secondary | ICD-10-CM

## 2021-09-04 DIAGNOSIS — E7800 Pure hypercholesterolemia, unspecified: Secondary | ICD-10-CM

## 2021-09-04 MED ORDER — ATORVASTATIN CALCIUM 10 MG PO TABS: ORAL_TABLET | ORAL | 0 refills | Status: DC

## 2021-09-07 ENCOUNTER — Ambulatory Visit (INDEPENDENT_AMBULATORY_CARE_PROVIDER_SITE_OTHER): Payer: 59

## 2021-09-07 DIAGNOSIS — Z23 Encounter for immunization: Secondary | ICD-10-CM | POA: Diagnosis not present

## 2021-11-07 ENCOUNTER — Other Ambulatory Visit: Payer: Self-pay | Admitting: Family Medicine

## 2021-11-07 DIAGNOSIS — N529 Male erectile dysfunction, unspecified: Secondary | ICD-10-CM

## 2021-11-07 NOTE — Telephone Encounter (Signed)
Requested Prescriptions  Pending Prescriptions Disp Refills  . tadalafil (CIALIS) 20 MG tablet [Pharmacy Med Name: TADALAFIL 20 MG TABLET] 20 tablet     Sig: TAKE  1/2-1 TABLET BY MOUTH EVERY OTHER DAY AS NEEDED FOR ERECTILE DYSFUNCTION     Urology: Erectile Dysfunction Agents Passed - 11/07/2021  5:44 PM      Passed - AST in normal range and within 360 days    AST  Date Value Ref Range Status  05/19/2021 21 0 - 40 IU/L Final         Passed - ALT in normal range and within 360 days    ALT  Date Value Ref Range Status  05/19/2021 16 0 - 44 IU/L Final         Passed - Last BP in normal range    BP Readings from Last 1 Encounters:  08/30/21 122/84         Passed - Valid encounter within last 12 months    Recent Outpatient Visits          2 months ago [facility] [place], Deanna C, MD   2 months ago Acute non-recurrent sinusitis, unspecified location   [facility] [place], [place], MD   5 months ago Annual physical exam   [facility] at [place], Deanna C, MD   9 months ago Hypercholesterolemia   [facility] [place], Deanna C, MD   1 year ago Chalazion left lower eyelid   [facility] [place], [place], MD      Future Appointments            In 2 weeks Juline Patch, MD [facility], [facility]

## 2021-11-10 ENCOUNTER — Other Ambulatory Visit: Payer: Self-pay | Admitting: Family Medicine

## 2021-11-10 DIAGNOSIS — M26649 Arthritis of unspecified temporomandibular joint: Secondary | ICD-10-CM

## 2021-11-24 ENCOUNTER — Ambulatory Visit: Payer: 59 | Admitting: Family Medicine

## 2021-11-25 ENCOUNTER — Other Ambulatory Visit: Payer: Self-pay | Admitting: Family Medicine

## 2021-11-25 DIAGNOSIS — I10 Essential (primary) hypertension: Secondary | ICD-10-CM

## 2021-12-04 ENCOUNTER — Other Ambulatory Visit: Payer: Self-pay | Admitting: Neurology

## 2021-12-04 DIAGNOSIS — G20C Parkinsonism, unspecified: Secondary | ICD-10-CM

## 2021-12-13 ENCOUNTER — Other Ambulatory Visit: Payer: Self-pay | Admitting: Family Medicine

## 2021-12-13 DIAGNOSIS — E7800 Pure hypercholesterolemia, unspecified: Secondary | ICD-10-CM

## 2021-12-13 NOTE — Telephone Encounter (Signed)
Requested Prescriptions  Pending Prescriptions Disp Refills  . atorvastatin (LIPITOR) 10 MG tablet [Pharmacy Med Name: ATORVASTATIN '10MG'$  TABLETS] 90 tablet 0    Sig: TAKE 1 TABLET(10 MG) BY MOUTH DAILY     Cardiovascular:  Antilipid - Statins Failed - 12/13/2021  3:32 AM      Failed - Lipid Panel in normal range within the last 12 months    Cholesterol, Total  Date Value Ref Range Status  05/19/2021 127 100 - 199 mg/dL Final   LDL Chol Calc (NIH)  Date Value Ref Range Status  05/19/2021 61 0 - 99 mg/dL Final   HDL  Date Value Ref Range Status  05/19/2021 51 >39 mg/dL Final   Triglycerides  Date Value Ref Range Status  05/19/2021 73 0 - 149 mg/dL Final         Passed - Patient is not pregnant      Passed - Valid encounter within last 12 months    Recent Outpatient Visits          3 months ago [facility] [place], [place], MD   3 months ago Acute non-recurrent sinusitis, unspecified location   [facility] at [place], Deanna C, MD   6 months ago Annual physical exam   [facility] [place], Deanna C, MD   10 months ago Hypercholesterolemia   [facility] at [place], Deanna C, MD   1 year ago Chalazion left lower eyelid   [facility] [place], [place], MD      Future Appointments            In 3 weeks Juline Patch, MD [facility] at [facility], [facility]

## 2021-12-29 ENCOUNTER — Ambulatory Visit: Payer: 59

## 2021-12-29 ENCOUNTER — Ambulatory Visit (HOSPITAL_COMMUNITY)
Admission: RE | Admit: 2021-12-29 | Discharge: 2021-12-29 | Disposition: A | Discharge status: Home / Self Care | Payer: 59 | Source: Ambulatory Visit | Attending: Neurology | Admitting: Neurology

## 2021-12-29 DIAGNOSIS — G20C Parkinsonism, unspecified: Secondary | ICD-10-CM | POA: Diagnosis present

## 2022-01-03 ENCOUNTER — Ambulatory Visit (INDEPENDENT_AMBULATORY_CARE_PROVIDER_SITE_OTHER): Payer: 59 | Admitting: Family Medicine

## 2022-01-03 ENCOUNTER — Encounter: Payer: Self-pay | Admitting: Family Medicine

## 2022-01-03 VITALS — BP 126/78 | HR 83 | Ht 70.0 in | Wt 207.0 lb

## 2022-01-03 DIAGNOSIS — Z23 Encounter for immunization: Secondary | ICD-10-CM

## 2022-01-03 DIAGNOSIS — E7800 Pure hypercholesterolemia, unspecified: Secondary | ICD-10-CM

## 2022-01-03 DIAGNOSIS — M26649 Arthritis of unspecified temporomandibular joint: Secondary | ICD-10-CM

## 2022-01-03 DIAGNOSIS — H6993 Unspecified Eustachian tube disorder, bilateral: Secondary | ICD-10-CM | POA: Diagnosis not present

## 2022-01-03 DIAGNOSIS — N529 Male erectile dysfunction, unspecified: Secondary | ICD-10-CM

## 2022-01-03 DIAGNOSIS — K219 Gastro-esophageal reflux disease without esophagitis: Secondary | ICD-10-CM

## 2022-01-03 DIAGNOSIS — I10 Essential (primary) hypertension: Secondary | ICD-10-CM

## 2022-01-03 DIAGNOSIS — J301 Allergic rhinitis due to pollen: Secondary | ICD-10-CM

## 2022-01-03 MED ORDER — PSEUDOEPHEDRINE HCL ER 120 MG PO TB12: ORAL_TABLET | ORAL | 11 refills | Status: DC

## 2022-01-03 MED ORDER — PANTOPRAZOLE SODIUM 40 MG PO TBEC: DELAYED_RELEASE_TABLET | ORAL | 5 refills | Status: DC

## 2022-01-03 MED ORDER — MELOXICAM 7.5 MG PO TABS: ORAL_TABLET | ORAL | 5 refills | Status: DC

## 2022-01-03 MED ORDER — MONTELUKAST SODIUM 10 MG PO TABS: ORAL_TABLET | ORAL | 5 refills | Status: DC

## 2022-01-03 MED ORDER — ATORVASTATIN CALCIUM 10 MG PO TABS: ORAL_TABLET | ORAL | 5 refills | Status: DC

## 2022-01-03 MED ORDER — TADALAFIL 20 MG PO TABS: ORAL_TABLET | ORAL | 5 refills | Status: DC

## 2022-01-03 MED ORDER — LISINOPRIL 40 MG PO TABS: ORAL_TABLET | ORAL | 5 refills | Status: DC

## 2022-01-03 NOTE — Progress Notes (Signed)
Date:  01/03/2022   Name:  Tyler Glover   DOB:  11/04/1960   MRN:  [id number]   Chief Complaint: Hypertension, Hyperlipidemia, Allergic Rhinitis , Gastroesophageal Reflux, Flu Vaccine, and Erectile Dysfunction  Hypertension This is a chronic problem. The current episode started more than 1 year ago. The problem has been gradually improving since onset. The problem is controlled. Pertinent negatives include no chest pain, headaches, neck pain, palpitations, PND or shortness of breath. Past treatments include ACE inhibitors. The current treatment provides moderate improvement. There are no compliance problems.  There is no history of angina, kidney disease, CAD/MI, CVA, heart failure, left ventricular hypertrophy, PVD or retinopathy. There is no history of chronic renal disease, a hypertension causing med or renovascular disease.  Hyperlipidemia This is a chronic problem. The current episode started more than 1 year ago. The problem is controlled. Recent lipid tests were reviewed and are normal. He has no history of chronic renal disease. Pertinent negatives include no chest pain, myalgias or shortness of breath. Current antihyperlipidemic treatment includes diet change and statins. The current treatment provides moderate improvement of lipids. There are no compliance problems.   Gastroesophageal Reflux He reports no abdominal pain, no chest pain, no coughing, no dysphagia, no heartburn, no nausea, no sore throat or no wheezing. This is a chronic problem. The symptoms are aggravated by certain foods. He has tried a PPI for the symptoms. The treatment provided moderate relief.  Erectile Dysfunction This is a chronic problem. The problem has been gradually improving since onset. He reports no anxiety. Irritative symptoms do not include frequency or urgency. Pertinent negatives include no chills, dysuria or hematuria. Past treatments include tadalafil.    Lab Results  Component Value Date    NA 144 05/19/2021   K 4.4 05/19/2021   CO2 21 05/19/2021   GLUCOSE 88 05/19/2021   BUN 16 05/19/2021   CREATININE 0.86 05/19/2021   CALCIUM 9.7 05/19/2021   EGFR 100 05/19/2021   GFRNONAA 73 06/30/2019   Lab Results  Component Value Date   CHOL 127 05/19/2021   HDL 51 05/19/2021   LDLCALC 61 05/19/2021   TRIG 73 05/19/2021   CHOLHDL 4.6 08/22/2017   No results found for: "TSH" Lab Results  Component Value Date   HGBA1C 5.5 05/19/2021   No results found for: "WBC", "HGB", "HCT", "MCV", "PLT" Lab Results  Component Value Date   ALT 16 05/19/2021   AST 21 05/19/2021   ALKPHOS 83 05/19/2021   BILITOT 0.5 05/19/2021   Lab Results  Component Value Date   VD25OH 30.1 07/07/2015     Review of Systems  Constitutional:  Negative for chills and fever.  HENT:  Negative for drooling, ear discharge, ear pain and sore throat.   Respiratory:  Negative for cough, shortness of breath and wheezing.   Cardiovascular:  Negative for chest pain, palpitations, leg swelling and PND.  Gastrointestinal:  Negative for abdominal pain, blood in stool, constipation, diarrhea, dysphagia, heartburn and nausea.  Endocrine: Negative for polydipsia.  Genitourinary:  Negative for dysuria, frequency, hematuria and urgency.  Musculoskeletal:  Negative for back pain, myalgias and neck pain.  Skin:  Negative for rash.  Allergic/Immunologic: Negative for environmental allergies.  Neurological:  Negative for dizziness and headaches.  Hematological:  Does not bruise/bleed easily.  Psychiatric/Behavioral:  Negative for suicidal ideas. The patient is not nervous/anxious.     Patient Active Problem List   Diagnosis Date Noted   Encounter for screening colonoscopy  Polyp of transverse colon    Heartburn    Pure hypercholesterolemia 01/26/2016   Right lateral epicondylitis 07/07/2015   Vitamin D deficiency 12/24/2014   Prediabetes 12/24/2014   Allergic rhinitis, seasonal 06/03/2014   Acid reflux  06/03/2014   Essential (primary) hypertension 06/03/2014   Family history of diabetes mellitus 06/03/2014   Family history of cardiac disorder 06/03/2014   Obesity (BMI 30.0-34.9) 06/03/2014    Allergies  Allergen Reactions   Penicillins     Told as child not to do injectable, but has taken Amoxicillin since Unknown    Past Surgical History:  Procedure Laterality Date   COLONOSCOPY     COLONOSCOPY WITH PROPOFOL N/A 08/21/2019   Procedure: COLONOSCOPY WITH PROPOFOL;  Surgeon: Lucilla Lame, MD;  Location: [place];  Service: Endoscopy;  Laterality: N/A;  priority 4   ESOPHAGOGASTRODUODENOSCOPY (EGD) WITH PROPOFOL N/A 08/21/2019   Procedure: ESOPHAGOGASTRODUODENOSCOPY (EGD) WITH PROPOFOL;  Surgeon: Lucilla Lame, MD;  Location: [place];  Service: Endoscopy;  Laterality: N/A;   POLYPECTOMY  08/21/2019   Procedure: POLYPECTOMY;  Surgeon: Lucilla Lame, MD;  Location: [facility];  Service: Endoscopy;;   SHOULDER SURGERY     TESTICLE SURGERY     age 14 or 60   TONSILLECTOMY      Social History   Tobacco Use   Smoking status: Former    Types: Cigarettes    Quit date: 1993    Years since quitting: 30.9   Smokeless tobacco: Never  Vaping Use   Vaping Use: Never used  Substance Use Topics   Alcohol use: Not Currently    Alcohol/week: 0.0 standard drinks of alcohol   Drug use: No     Medication list has been reviewed and updated.  Current Meds  Medication Sig   acetaminophen (TYLENOL) 325 MG tablet Take by mouth.   atorvastatin (LIPITOR) 10 MG tablet TAKE 1 TABLET(10 MG) BY MOUTH DAILY   bismuth subsalicylate (PEPTO BISMOL) 262 MG/15ML suspension Take 30 mLs by mouth every 6 (six) hours as needed for indigestion.   calcium carbonate (OS-CAL) 600 MG tablet Take 600 mg by mouth daily.   Cholecalciferol (VITAMIN D) 2000 units CAPS Take 1 capsule (2,000 Units total) by mouth daily.   Cyanocobalamin 1000 MCG CAPS Take 1 capsule by mouth daily.    fluticasone (FLONASE) 50 MCG/ACT nasal spray USE 1 SPRAY IN EACH NOSTRIL DAILY   folic acid (FOLVITE) 1 MG tablet Take 1 tablet by mouth daily. patel   hydroxychloroquine (PLAQUENIL) 200 MG tablet Take 1 tablet by mouth 2 (two) times daily. Dr patel   ibuprofen (ADVIL) 200 MG tablet Take 1 tablet by mouth as needed.   lisinopril (ZESTRIL) 40 MG tablet TAKE 1 TABLET(40 MG) BY MOUTH DAILY   meloxicam (MOBIC) 7.5 MG tablet TAKE 1 TABLET(7.5 MG) BY MOUTH DAILY   methotrexate (RHEUMATREX) 2.5 MG tablet Take 7 tablets by mouth once a week. patel   montelukast (SINGULAIR) 10 MG tablet TAKE 1 TABLET(10 MG) BY MOUTH AT BEDTIME   Multiple Vitamin (MULTIVITAMIN) capsule Take 1 capsule by mouth daily.   OVER THE COUNTER MEDICATION daily. Total Brain Dietary Supplement   pantoprazole (PROTONIX) 40 MG tablet TAKE 1 TABLET(40 MG) BY MOUTH DAILY   pseudoephedrine (WAL-PHED 12 HOUR) 120 MG 12 hr tablet TAKE 1 TABLET BY MOUTH EVERY 12 HOURS AS NEEDED FOR CONGESTION   tadalafil (CIALIS) 20 MG tablet TAKE  1/2-1 TABLET BY MOUTH EVERY OTHER DAY AS NEEDED FOR ERECTILE DYSFUNCTION  Testosterone (ANDROGEL PUMP) 20.25 MG/ACT (1.62%) GEL Place 1 Pump onto the skin daily. Apply 1 pump to the forearm daily   Turmeric (RA TURMERIC EXTRA STRENGTH) 1053 MG TABS Take by mouth.   vitamin E 180 MG (400 UNITS) capsule Take 400 Units by mouth daily.       01/03/2022   11:25 AM 08/28/2021   11:42 AM 09/05/2020    8:36 AM 07/04/2020    9:24 AM  GAD 7 : Generalized Anxiety Score  Nervous, Anxious, on Edge 0 0 0 0  Control/stop worrying 0 0 0 0  Worry too much - different things 0 0 0 0  Trouble relaxing 0 0 0 0  Restless 0 0 0 0  Easily annoyed or irritable 0 0 0 0  Afraid - awful might happen 0 0 0 0  Total GAD 7 Score 0 0 0 0  Anxiety Difficulty Not difficult at all Not difficult at all         01/03/2022   11:24 AM 08/28/2021   11:42 AM 09/05/2020    8:35 AM  Depression screen PHQ 2/9  Decreased Interest 0 0 0   Down, Depressed, Hopeless 0 0 0  PHQ - 2 Score 0 0 0  Altered sleeping 0 0 0  Tired, decreased energy 0 0 0  Change in appetite 0 0 0  Feeling bad or failure about yourself  0 0 0  Trouble concentrating 0 0 0  Moving slowly or fidgety/restless 0 0 0  Suicidal thoughts 0 0 0  PHQ-9 Score 0 0 0  Difficult doing work/chores Not difficult at all Not difficult at all     BP Readings from Last 3 Encounters:  01/03/22 126/78  08/30/21 122/84  08/28/21 130/80    Physical Exam Vitals and nursing note reviewed.  HENT:     Head: Normocephalic.     Right Ear: External ear normal.     Left Ear: External ear normal.     Nose: Nose normal.     Mouth/Throat:     Mouth: Mucous membranes are moist.  Eyes:     General: No scleral icterus.       Right eye: No discharge.        Left eye: No discharge.     Conjunctiva/sclera: Conjunctivae normal.     Pupils: Pupils are equal, round, and reactive to light.  Neck:     Thyroid: No thyromegaly.     Vascular: No JVD.     Trachea: No tracheal deviation.  Cardiovascular:     Rate and Rhythm: Normal rate and regular rhythm.     Heart sounds: Normal heart sounds. No murmur heard.    No friction rub. No gallop.  Pulmonary:     Effort: No respiratory distress.     Breath sounds: Normal breath sounds. No wheezing, rhonchi or rales.  Abdominal:     General: Bowel sounds are normal.     Palpations: Abdomen is soft. There is no mass.     Tenderness: There is no abdominal tenderness. There is no guarding or rebound.  Musculoskeletal:        General: No tenderness. Normal range of motion.     Cervical back: Normal range of motion and neck supple.  Lymphadenopathy:     Cervical: No cervical adenopathy.  Skin:    General: Skin is warm.     Findings: No rash.  Neurological:     Mental Status: He is alert.  Wt Readings from Last 3 Encounters:  01/03/22 207 lb (93.9 kg)  08/30/21 212 lb (96.2 kg)  08/28/21 212 lb (96.2 kg)    BP 126/78    Pulse 83   Ht 5' 10" (1.778 m)   Wt 207 lb (93.9 kg)   SpO2 98%   BMI 29.70 kg/m   Assessment and Plan: 1. Hypercholesterolemia Chronic.  Controlled.  Stable.  Continue atorvastatin 10 mg once a day. - atorvastatin (LIPITOR) 10 MG tablet; TAKE 1 TABLET(10 MG) BY MOUTH DAILY  Dispense: 30 tablet; Refill: 5  2. Essential (primary) hypertension Chronic.  Controlled.  Stable.  Blood pressure today is 126/78.  Continue lisinopril 40 mg once a day. - atorvastatin (LIPITOR) 10 MG tablet; TAKE 1 TABLET(10 MG) BY MOUTH DAILY  Dispense: 30 tablet; Refill: 5 - lisinopril (ZESTRIL) 40 MG tablet; TAKE 1 TABLET(40 MG) BY MOUTH DAILY  Dispense: 30 tablet; Refill: 5  3. Arthritis of temporomandibular joint, unspecified laterality .  Controlled.  Stable.  Continue meloxicam 7.5 mg daily. - meloxicam (MOBIC) 7.5 MG tablet; TAKE 1 TABLET(7.5 MG) BY MOUTH DAILY  Dispense: 30 tablet; Refill: 5  4. Eustachian tube dysfunction, bilateral Chronic.  Controlled.  Stable.  Controlled symptomatology of eustachian tube dysfunction with Singulair 10 mg once a day and Sudafed on a as needed basis.  Tympanic membranes were not examined and there was no retraction of TMs. - montelukast (SINGULAIR) 10 MG tablet; TAKE 1 TABLET(10 MG) BY MOUTH AT BEDTIME  Dispense: 30 tablet; Refill: 5 - pseudoephedrine (WAL-PHED 12 HOUR) 120 MG 12 hr tablet; TAKE 1 TABLET BY MOUTH EVERY 12 HOURS AS NEEDED FOR CONGESTION  Dispense: 30 tablet; Refill: 11  5. Gastroesophageal reflux disease without esophagitis .  Controlled.  Stable.  Continue pantoprazole 40 mg once a day. - pantoprazole (PROTONIX) 40 MG tablet; TAKE 1 TABLET(40 MG) BY MOUTH DAILY  Dispense: 30 tablet; Refill: 5  6. Seasonal allergic rhinitis due to pollen As noted above - pseudoephedrine (WAL-PHED 12 HOUR) 120 MG 12 hr tablet; TAKE 1 TABLET BY MOUTH EVERY 12 HOURS AS NEEDED FOR CONGESTION  Dispense: 30 tablet; Refill: 11  7. Vasculogenic erectile dysfunction,  unspecified vasculogenic erectile dysfunction type .  Controlled.  Stable.  Continue Cialis 20 mg 1/2 to 1 tablets as needed. - tadalafil (CIALIS) 20 MG tablet; TAKE  1/2-1 TABLET BY MOUTH EVERY OTHER DAY AS NEEDED FOR ERECTILE DYSFUNCTION  Dispense: 20 tablet; Refill: 5  8. Need for immunization against influenza Discussed and administered - Flu Vaccine QUAD 29moIM (Fluarix, Fluzone & Alfiuria Quad PF)     DOtilio Miu MD

## 2022-03-27 ENCOUNTER — Other Ambulatory Visit: Payer: Self-pay

## 2022-03-27 DIAGNOSIS — I10 Essential (primary) hypertension: Secondary | ICD-10-CM

## 2022-03-27 MED ORDER — LISINOPRIL 40 MG PO TABS: ORAL_TABLET | ORAL | 1 refills | Status: DC

## 2022-05-02 ENCOUNTER — Ambulatory Visit: Payer: 59 | Attending: Neurology

## 2022-05-02 DIAGNOSIS — R262 Difficulty in walking, not elsewhere classified: Secondary | ICD-10-CM | POA: Diagnosis present

## 2022-05-02 DIAGNOSIS — M6281 Muscle weakness (generalized): Secondary | ICD-10-CM | POA: Diagnosis present

## 2022-05-02 DIAGNOSIS — R278 Other lack of coordination: Secondary | ICD-10-CM | POA: Insufficient documentation

## 2022-05-02 DIAGNOSIS — R2681 Unsteadiness on feet: Secondary | ICD-10-CM | POA: Diagnosis present

## 2022-05-02 NOTE — Therapy (Signed)
OUTPATIENT PHYSICAL THERAPY LSVT EVALUATION   Patient Name: Tyler Glover MRN: [id number] DOB:15-Feb-1960, 62 y.o., male Today's Date: 05/03/2022  PCP: Juline Patch, MD REFERRING PROVIDER: Vladimir Crofts, MD  END OF SESSION:  PT End of Session - 05/03/22 1437     Visit Number 1    Number of Visits 17    Date for PT Re-Evaluation 06/14/22   additional 2 weeks in case of need to reschedule   PT Start Time 1646    PT Stop Time 1742    PT Time Calculation (min) 56 min    Equipment Utilized During Treatment Gait belt    Activity Tolerance Patient tolerated treatment well    Behavior During Therapy WFL for tasks assessed/performed             Past Medical History:  Diagnosis Date   Allergy    Arthritis    Rheumatoid   GERD (gastroesophageal reflux disease)    Hypertension    Past Surgical History:  Procedure Laterality Date   COLONOSCOPY     COLONOSCOPY WITH PROPOFOL N/A 08/21/2019   Procedure: COLONOSCOPY WITH PROPOFOL;  Surgeon: Lucilla Lame, MD;  Location: [place];  Service: Endoscopy;  Laterality: N/A;  priority 4   ESOPHAGOGASTRODUODENOSCOPY (EGD) WITH PROPOFOL N/A 08/21/2019   Procedure: ESOPHAGOGASTRODUODENOSCOPY (EGD) WITH PROPOFOL;  Surgeon: Lucilla Lame, MD;  Location: [facility];  Service: Endoscopy;  Laterality: N/A;   POLYPECTOMY  08/21/2019   Procedure: POLYPECTOMY;  Surgeon: Lucilla Lame, MD;  Location: [place];  Service: Endoscopy;;   SHOULDER SURGERY     TESTICLE SURGERY     age 61 or 45   TONSILLECTOMY     Patient Active Problem List   Diagnosis Date Noted   Encounter for screening colonoscopy    Polyp of transverse colon    Heartburn    Pure hypercholesterolemia 01/26/2016   Right lateral epicondylitis 07/07/2015   Vitamin D deficiency 12/24/2014   Prediabetes 12/24/2014   Allergic rhinitis, seasonal 06/03/2014   Acid reflux 06/03/2014   Essential (primary) hypertension 06/03/2014   Family history of  diabetes mellitus 06/03/2014   Family history of cardiac disorder 06/03/2014   Obesity (BMI 30.0-34.9) 06/03/2014    ONSET DATE: 2 years ago  REFERRING DIAG: G20.C (ICD-10-CM) - Parkinsonism, unspecified   THERAPY DIAG:  Other lack of coordination  Muscle weakness (generalized)  Unsteadiness on feet  Difficulty in walking, not elsewhere classified  Rationale for Evaluation and Treatment: Rehabilitation  SUBJECTIVE:  SUBJECTIVE STATEMENT: Pt presents to PT for LSVT BIG evaluation.   Pt accompanied by: self  PERTINENT HISTORY:   Pt is a pleasant 62 yo male presenting for LSVT BIG evaluation. He is ambulating without an AD. 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 He initially noticed difficulty with LLE and LUE, with LUE shaking. Pt was taking sinemet but reports this was discontinued after recent doctor appointment due to unwanted symptoms while on it. He has since changed to a new medication that he feels is helping.  He reports no falls within the past year.  He has noticed his gait has slowed down. He reports he has a L side limp. He takes more time to complete ADLs. 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 One of his goals is to improve his ability to work on cars as well as to improve his walking. PMH significant for rheumatoid arthritis, HTN, hx of shoulder surgery (date?)  PAIN:  Are you having pain? Yes: NPRS scale: 3-4/10 Pain location: low back and mid-upper back, bilateral hands (chronic) Pain description:   Aggravating factors:   Relieving factors:    PRECAUTIONS: Fall  WEIGHT BEARING RESTRICTIONS: No  FALLS: Has patient fallen in last 6 months? No  LIVING  ENVIRONMENT: Lives with:  Lives in:  Stairs:  Has following equipment at home:   PLOF: Independent  PATIENT GOALS: He would like to improve his mobility of L side to improve his ability to work on cars with students, improve gait   Imaging: via chart MR brain 12/29/21: IMPRESSION: 1. Normal brain MRI. 2. NeuroQuant volumetric analysis of the brain, see details on [employer].    OBJECTIVE:   LSVT BIG EVALUATION & EXAMINATION   Neurological and Other Medical Information:   What were your initial symptoms of Parkinson's disease? Initially thought symptoms were due to RA (which pt does have), but noticed weakness in LLE. He tried therapy for LLE but problems progressed.    Do you have tremors?  Yes:   L hand   Do you have any pain? Yes: low and mid-upper back, bilat hands    Medical Information:    Medication for Parkinson's disease: he reports he discontinued sinemet, started new medication (pt is unsure what new medication is called)   In what ways are your medication(s) for Parkinson's helpful?  Pt reports medication has made him feel less cloudy when thinking. Feels he can walk a little better.   Do you experience on/off symptoms? Yes: At the end off the day he is more symptomatic      Motor Symptoms:    When did you first start to notice changes in your movement you associate with Parkinson's disease? 2 years ago   What are your current symptoms? Pt reports difficulty lifting L foot, reports L limp, shaking of LUE, he reports his walking speed has slowed down   What do you do when you want to move the best you possibly can? "I tell my leg to get going."    Has Parkinson's disease caused you to move less or be less active? Yes: I take more breaks  Have you noticed if your movement is slower than it used to be? For example, walking, getting dressed, doing household chores, bathing, etc. Yes: walking has slowed, speed at which he bathes, dresses and does household  chores has slowed down a bit  Have you or others noticed any changes in your posture? Yes: He thinks his posture has changed but unsure how   How many (if any) falls have you had in the last six months? No    Have you noticed any freezing with your movement? No   Are there some activities you now need help with because of your Parkinson's disease? For example, getting socks or shoes on, buttoning, getting up from low chairs, walking on uneven ground, etc.: Reports every once in a while requires help getting out of a low couch/chair, does report difficulty with buttoning, taking off/putting on shoes, walking on uneven ground requires increased concentration   Have you noticed any changes in the functioning of your hands? Yes: I don't grab as quickly as I used to   Has Parkinson's disease caused you to use your more affected hand less? Yes:   affects non dominant hand  Have you noticed any changes in your ability to: Yes Button Manipulate money Type/use computer  Have you noticed if your hands feel any weaker than they used to? No     Movement Situations:    If you had one situation in which you wanted to move well, what would it be?  Working on cars, gait   Assessment:     MMT:  LE strength grossly 4+/5 BLE    Coordination/Cerebellar: Finger to Nose: WFL Heel to Shin: Impaired:  slight decrease in speed LLE Rapid alternating movements: Impaired: LUE   Sensation: Impaired: reports decreased sensation LLE and LUE compared to RLE and RUE      Sit to stand assessment:  5x STS: 14.22 sec hands-free   Gait assessments:  10 MWT:  1.01 m/s  L foot scuff/decreased heel strike, absent l arm swing, lack of trunk rotation, slight crouched posture    Functional testing: Doffing shoes (bilateral): 27.5 seconds (must use UE to assist with crossing LLE ), performed seated  Donning shoes (bilateral): 68 seconds, performed seated. Notable decrease in speed with tying shoe laces, pt  reports mostly because of LUE  Handwriting assessment: Pt R handed. WNL. Clear, big writing. Pt reports this has improved since medication change.  Coin stacking (6 coins): with RUE 12.5 seconds, LUE 30.5 seconds  Getting in and out of bed: in (using mat table) 30.5 seconds, out of bed 9 seconds. Uses forward approach to mat table>quadruped>roll>supine (significant decreased speed with roll). Pt reports he must use his R leg mostly to assist with bed mobility   Balance Assessments: SLB:  5 seconds LLE, 10 seconds RLE Able to clear obstacle (orange hurdle)   Ascending/descending steps: reciprocal pattern ascend/descend UUE support   Functional Component movements:   Sit to stand (required) Rolling side-lying<>supine each direction SLB LLE (30 sec reps) Stack 6 coins LUE Donning shoes   Hierarchies:  Getting in and out of bed 2. Ambulating over uneven surfaces/obstacles with no AD or LRD     PATIENT SURVEYS:  FOTO 81   PATIENT EDUCATION:  Education details: exam findings, goals, plan, program, reviewed what HEP will entail (will go over exercises next visit for first treatment) Person educated: Patient Education method: Explanation Education comprehension: verbalized understanding   TODAY'S TREATMENT:  DATE: eval only  HOME EXERCISE PROGRAM:  LSVT BIG maximal daily exercises, functional component, hierarchy tasks and carryover task to be initiated next visit  GOALS: Goals reviewed with patient? Yes   SHORT TERM GOALS: Target date: 05/23/2022    Patient will be independent in home exercise program to improve strength/mobility for better functional independence with ADLs. Baseline: to be initiated next visit Goal status: INITIAL   LONG TERM GOALS: Target date: 06/13/2022    Patient will increase FOTO score to equal to or greater  than  86   to demonstrate improvement in mobility and quality of life.  Baseline: 81 Goal status: INITIAL  2.  Patient (< 48 years old) will complete five times sit to stand test in < 10 seconds indicating an increased LE strength and improved balance. Baseline: 14.22 sec Goal status: INITIAL  3.  Patient will increase LLE SLB to at least 10 seconds to indicate improved balance/ability to safely clear obstacles and ambulate Baseline: LLE 5 seconds Goal status: INITIAL  4.  Patient will increase 10 meter walk test to >1.1 m/s as to improve gait speed for better community ambulation and to reduce fall risk. Baseline:  1.01 m/s Goal status: INITIAL   6.  Pt will improve ability to don both shoes while seated by at least 10 seconds in order to demonstrate improved functional mobility of BUE and increased ease in dressing. Baseline: 68 seconds Goal status: INITIAL   ASSESSMENT:  CLINICAL IMPRESSION: Patient is a pleasant 62 y.o. male with recent diagnosis of suspected PD who was seen today for LSVT BIG PT evaluation. Examination reveals impairments in balance, strength, gait mechanics/speed and general decrease in mobility. Pt with bradykinetic and hypokinetic movement throughout L side. These impairments are decreasing pt's ability to perform ADLs and affecting his ability to carry out his job duties. They also place pt at an increased risk for a future fall. The pt will benefit from LSVT BIG PT to address and improve these impairments to improve strength, gait, balance and mobility.   OBJECTIVE IMPAIRMENTS: Abnormal gait, decreased balance, decreased coordination, decreased knowledge of use of DME, decreased mobility, difficulty walking, decreased ROM, decreased strength, impaired sensation, impaired UE functional use, improper body mechanics, postural dysfunction, and pain.   ACTIVITY LIMITATIONS: carrying, squatting, stairs, transfers, bed mobility, dressing, and locomotion  level  PARTICIPATION LIMITATIONS: meal prep, cleaning, laundry, shopping, community activity, occupation, and yard work  PERSONAL FACTORS: Age, Time since onset of injury/illness/exacerbation, and 1-2 comorbidities: rheumatoid arthritis, HTN, hx of shoulder surgery (date?)  are also affecting patient's functional outcome.   REHAB POTENTIAL: Good  CLINICAL DECISION MAKING: Evolving/moderate complexity  EVALUATION COMPLEXITY: Moderate  PLAN:  PT FREQUENCY: 4x/week  PT DURATION: 6 weeks  PLANNED INTERVENTIONS: Therapeutic exercises, Therapeutic activity, Neuromuscular re-education, Balance training, Gait training, Patient/Family education, Self Care, Joint mobilization, Stair training, Vestibular training, Canalith repositioning, Orthotic/Fit training, DME instructions, Electrical stimulation, Wheelchair mobility training, Spinal mobilization, Cryotherapy, Moist heat, Splintting, Taping, Traction, Manual therapy, and Re-evaluation  PLAN FOR NEXT SESSION: initiate LSVT BIG protocol/exercises and HEP   Zollie Pee, PT 05/03/2022, 3:07 PM

## 2022-05-07 ENCOUNTER — Ambulatory Visit: Payer: 59

## 2022-05-07 DIAGNOSIS — R278 Other lack of coordination: Secondary | ICD-10-CM

## 2022-05-07 DIAGNOSIS — R2681 Unsteadiness on feet: Secondary | ICD-10-CM

## 2022-05-07 DIAGNOSIS — R262 Difficulty in walking, not elsewhere classified: Secondary | ICD-10-CM

## 2022-05-07 NOTE — Therapy (Signed)
OUTPATIENT PHYSICAL THERAPY LSVT TREATMENT NOTE   Patient Name: Tyler Glover MRN: [id number] DOB:Mar 31, 1960, 62 y.o., male Today's Date: 05/08/2022  PCP: Juline Patch, MD REFERRING PROVIDER: Vladimir Crofts, MD  END OF SESSION:  PT End of Session - 05/08/22 0854     Visit Number 2    Number of Visits 17    Date for PT Re-Evaluation 06/14/22   additional 2 weeks in case of need to reschedule   PT Start Time 1635    PT Stop Time 1731    PT Time Calculation (min) 56 min    Equipment Utilized During Treatment Gait belt    Activity Tolerance Patient tolerated treatment well    Behavior During Therapy WFL for tasks assessed/performed              Past Medical History:  Diagnosis Date   Allergy    Arthritis    Rheumatoid   GERD (gastroesophageal reflux disease)    Hypertension    Past Surgical History:  Procedure Laterality Date   COLONOSCOPY     COLONOSCOPY WITH PROPOFOL N/A 08/21/2019   Procedure: COLONOSCOPY WITH PROPOFOL;  Surgeon: Lucilla Lame, MD;  Location: [facility];  Service: Endoscopy;  Laterality: N/A;  priority 4   ESOPHAGOGASTRODUODENOSCOPY (EGD) WITH PROPOFOL N/A 08/21/2019   Procedure: ESOPHAGOGASTRODUODENOSCOPY (EGD) WITH PROPOFOL;  Surgeon: Lucilla Lame, MD;  Location: [facility];  Service: Endoscopy;  Laterality: N/A;   POLYPECTOMY  08/21/2019   Procedure: POLYPECTOMY;  Surgeon: Lucilla Lame, MD;  Location: [facility];  Service: Endoscopy;;   SHOULDER SURGERY     TESTICLE SURGERY     age 47 or 78   TONSILLECTOMY     Patient Active Problem List   Diagnosis Date Noted   Encounter for screening colonoscopy    Polyp of transverse colon    Heartburn    Pure hypercholesterolemia 01/26/2016   Right lateral epicondylitis 07/07/2015   Vitamin D deficiency 12/24/2014   Prediabetes 12/24/2014   Allergic rhinitis, seasonal 06/03/2014   Acid reflux 06/03/2014   Essential (primary) hypertension 06/03/2014   Family history of  diabetes mellitus 06/03/2014   Family history of cardiac disorder 06/03/2014   Obesity (BMI 30.0-34.9) 06/03/2014    ONSET DATE: 2 years ago  REFERRING DIAG: G20.C (ICD-10-CM) - Parkinsonism, unspecified   THERAPY DIAG:  Other lack of coordination  Difficulty in walking, not elsewhere classified  Unsteadiness on feet  Rationale for Evaluation and Treatment: Rehabilitation  SUBJECTIVE:  SUBJECTIVE STATEMENT: Pt reports no recent stumbles/LOB. No other updates reported.   Pt accompanied by: self  PERTINENT HISTORY:   Pt is a pleasant 62 yo male presenting for LSVT BIG evaluation. He is ambulating without an AD. 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 He initially noticed difficulty with LLE and LUE, with LUE shaking. Pt was taking sinemet but reports this was discontinued after recent doctor appointment due to unwanted symptoms while on it. He has since changed to a new medication that he feels is helping.  He reports no falls within the past year.  He has noticed his gait has slowed down. He reports he has a L side limp. He takes more time to complete ADLs. 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 One of his goals is to improve his ability to work on cars as well as to improve his walking. PMH significant for rheumatoid arthritis, HTN, hx of shoulder surgery (date?)  PAIN:  Are you having pain? Yes: NPRS scale: 3-4/10 Pain location: low back and mid-upper back, bilateral hands (chronic) Pain description:   Aggravating factors:   Relieving factors:    PRECAUTIONS: Fall  WEIGHT BEARING RESTRICTIONS: No  FALLS: Has patient fallen in last 6 months? No  LIVING ENVIRONMENT: Lives with:   Lives in:  Stairs:  Has following equipment at home:   PLOF: Independent  PATIENT GOALS: He would like to improve his mobility of L side to improve his ability to work on cars with students, improve gait   Imaging: via chart MR brain 12/29/21: IMPRESSION: 1. Normal brain MRI. 2. NeuroQuant volumetric analysis of the brain, see details on [employer].    OBJECTIVE:   LSVT BIG EVALUATION & EXAMINATION   Neurological and Other Medical Information:   What were your initial symptoms of Parkinson's disease? Initially thought symptoms were due to RA (which pt does have), but noticed weakness in LLE. He tried therapy for LLE but problems progressed.    Do you have tremors?  Yes:   L hand   Do you have any pain? Yes: low and mid-upper back, bilat hands    Medical Information:    Medication for Parkinson's disease: he reports he discontinued sinemet, started new medication (pt is unsure what new medication is called)   In what ways are your medication(s) for Parkinson's helpful?  Pt reports medication has made him feel less cloudy when thinking. Feels he can walk a little better.   Do you experience on/off symptoms? Yes: At the end off the day he is more symptomatic      Motor Symptoms:    When did you first start to notice changes in your movement you associate with Parkinson's disease? 2 years ago   What are your current symptoms? Pt reports difficulty lifting L foot, reports L limp, shaking of LUE, he reports his walking speed has slowed down   What do you do when you want to move the best you possibly can? "I tell my leg to get going."    Has Parkinson's disease caused you to move less or be less active? Yes: I take more breaks  Have you noticed if your movement is slower than it used to be? For example, walking, getting dressed, doing household chores, bathing, etc. Yes: walking has slowed, speed at which he bathes, dresses and does household chores has slowed down a  bit  Have you or others noticed any changes in your posture? Yes: He thinks his posture has changed but unsure how   How many (if any) falls have you had in the last six months? No    Have you noticed any freezing with your movement? No   Are there some activities you now need help with because of your Parkinson's disease? For example, getting socks or shoes on, buttoning, getting up from low chairs, walking on uneven ground, etc.: Reports every once in a while requires help getting out of a low couch/chair, does report difficulty with buttoning, taking off/putting on shoes, walking on uneven ground requires increased concentration   Have you noticed any changes in the functioning of your hands? Yes: I don't grab as quickly as I used to   Has Parkinson's disease caused you to use your more affected hand less? Yes:   affects non dominant hand  Have you noticed any changes in your ability to: Yes Button Manipulate money Type/use computer  Have you noticed if your hands feel any weaker than they used to? No     Movement Situations:    If you had one situation in which you wanted to move well, what would it be?  Working on cars, gait   Assessment:     MMT:  LE strength grossly 4+/5 BLE    Coordination/Cerebellar: Finger to Nose: WFL Heel to Shin: Impaired:  slight decrease in speed LLE Rapid alternating movements: Impaired: LUE   Sensation: Impaired: reports decreased sensation LLE and LUE compared to RLE and RUE      Sit to stand assessment:  5x STS: 14.22 sec hands-free   Gait assessments:  10 MWT:  1.01 m/s  L foot scuff/decreased heel strike, absent l arm swing, lack of trunk rotation, slight crouched posture    Functional testing: Doffing shoes (bilateral): 27.5 seconds (must use UE to assist with crossing LLE ), performed seated  Donning shoes (bilateral): 68 seconds, performed seated. Notable decrease in speed with tying shoe laces, pt reports mostly because of  LUE  Handwriting assessment: Pt R handed. WNL. Clear, big writing. Pt reports this has improved since medication change.  Coin stacking (6 coins): with RUE 12.5 seconds, LUE 30.5 seconds  Getting in and out of bed: in (using mat table) 30.5 seconds, out of bed 9 seconds. Uses forward approach to mat table>quadruped>roll>supine (significant decreased speed with roll). Pt reports he must use his R leg mostly to assist with bed mobility   Balance Assessments: SLB:  5 seconds LLE, 10 seconds RLE Able to clear obstacle (orange hurdle)   Ascending/descending steps: reciprocal pattern ascend/descend UUE support   Functional Component movements:   Sit to stand (required) Rolling side-lying<>supine each direction SLB LLE (30 sec reps) Stack 6 coins LUE Donning shoes   Hierarchies:  Getting in and out of bed 2. Ambulating over uneven surfaces/obstacles with no AD or LRD     PATIENT SURVEYS:  FOTO 81   PATIENT EDUCATION:  Education details:  HEP, exercise technique, details of LSVT BIG protocol Person educated: Patient Education method: Explanation, Demonstration, Tactile cues, Verbal cues, and Handouts Education comprehension: verbalized understanding, returned demonstration, verbal cues required, tactile cues required, and needs further education   TODAY'S TREATMENT:  DATE: 05/07/2022   LSVT BIG Treatment:  Patient seen for LSVT Daily Session Maximal Daily Exercises for facilitation/coordination of movement Sustained movements are designed to rescale the amplitude of movement output for generalization to daily functional activities.  Maximal Daily Exercises: Floor to Ceiling 8 with 10 second hold Side to Side 8 Bilateral with 10 second hold Forward Step and Reach 10 Bilateral Sideways Step and Reach 10 Bilateral Backwards Step and Reach 10  Bilateral Forwards Rock and Reach 10 Bilateral Sideways Rock and Reach 10 Bilateral  Comments: Rates interventions as challenging, somewhat fatiguing. Requires 100% cuing/modeling today for technique.    Functional Component movements:  5 reps of each of the following Sit to stand (required) Rolling side-lying<>supine each direction SLB LLE (30 sec reps) Stack 6 coins LUE Donning shoes Comments: Requires cuing/modeling 100% of the time, additional cues for increased speed/amplitude of movement   Hierarchies:  Getting in and out of bed 1x through 2. Ambulating over uneven surfaces/obstacles with no AD or LRD - incorporated into Gait (big walking), use of obstacle to promote obstacle clearance on this date  Gait - performance and progress: 3 laps of 148 ft with stepping over shoe box, hands-on assist for L shoulder ext with arm swing, cuing for upright posture, opening L hand, sequencing. Cuing provided to override hypokinesia/bradykinesia. PT provides SBA throughout.  Carryover Assignment: Pt instructed to use big effort with finger flicks, emphasis LUE  Homework: Pt to perform all 7 LSVT BIG exercises, 5 Functional Component Tasks, carryover task and big walking.    HOME EXERCISE PROGRAM:  LSVT BIG maximal daily exercises, functional component, hierarchy tasks and carryover task to be initiated next visit  GOALS: Goals reviewed with patient? Yes   SHORT TERM GOALS: Target date: 05/23/2022    Patient will be independent in home exercise program to improve strength/mobility for better functional independence with ADLs. Baseline: to be initiated next visit Goal status: INITIAL   LONG TERM GOALS: Target date: 06/13/2022    Patient will increase FOTO score to equal to or greater than  86   to demonstrate improvement in mobility and quality of life.  Baseline: 81 Goal status: INITIAL  2.  Patient (< 58 years old) will complete five times sit to stand test in < 10 seconds  indicating an increased LE strength and improved balance. Baseline: 14.22 sec Goal status: INITIAL  3.  Patient will increase LLE SLB to at least 10 seconds to indicate improved balance/ability to safely clear obstacles and ambulate Baseline: LLE 5 seconds Goal status: INITIAL  4.  Patient will increase 10 meter walk test to >1.1 m/s as to improve gait speed for better community ambulation and to reduce fall risk. Baseline:  1.01 m/s Goal status: INITIAL   6.  Pt will improve ability to don both shoes while seated by at least 10 seconds in order to demonstrate improved functional mobility of BUE and increased ease in dressing. Baseline: 68 seconds Goal status: INITIAL   ASSESSMENT:  CLINICAL IMPRESSION: Pt generally fatigued with LSVT BIG interventions with hypokinetic/bradykinetic movement of L side, particularly LUE, throughout. PT provided hands-on assist in addition to other cues to improve LUE finger abduction, speed of movement and shoulder ext with all activities and gait. Pt also with noticeable difficulty employing large amplitude movement with LLE. The pt will benefit from LSVT BIG PT to address and improve these impairments to improve strength, gait, balance and mobility.   OBJECTIVE IMPAIRMENTS: Abnormal gait, decreased balance, decreased coordination,  decreased knowledge of use of DME, decreased mobility, difficulty walking, decreased ROM, decreased strength, impaired sensation, impaired UE functional use, improper body mechanics, postural dysfunction, and pain.   ACTIVITY LIMITATIONS: carrying, squatting, stairs, transfers, bed mobility, dressing, and locomotion level  PARTICIPATION LIMITATIONS: meal prep, cleaning, laundry, shopping, community activity, occupation, and yard work  PERSONAL FACTORS: Age, Time since onset of injury/illness/exacerbation, and 1-2 comorbidities: rheumatoid arthritis, HTN, hx of shoulder surgery (date?)  are also affecting patient's functional  outcome.   REHAB POTENTIAL: Good  CLINICAL DECISION MAKING: Evolving/moderate complexity  EVALUATION COMPLEXITY: Moderate  PLAN:  PT FREQUENCY: 4x/week  PT DURATION: 6 weeks  PLANNED INTERVENTIONS: Therapeutic exercises, Therapeutic activity, Neuromuscular re-education, Balance training, Gait training, Patient/Family education, Self Care, Joint mobilization, Stair training, Vestibular training, Canalith repositioning, Orthotic/Fit training, DME instructions, Electrical stimulation, Wheelchair mobility training, Spinal mobilization, Cryotherapy, Moist heat, Splintting, Taping, Traction, Manual therapy, and Re-evaluation  PLAN FOR NEXT SESSION: initiate LSVT BIG protocol/exercises and HEP   Zollie Pee, PT 05/08/2022, 9:02 AM

## 2022-05-08 ENCOUNTER — Ambulatory Visit: Payer: 59

## 2022-05-08 DIAGNOSIS — R278 Other lack of coordination: Secondary | ICD-10-CM | POA: Diagnosis not present

## 2022-05-08 DIAGNOSIS — R262 Difficulty in walking, not elsewhere classified: Secondary | ICD-10-CM

## 2022-05-08 DIAGNOSIS — R2681 Unsteadiness on feet: Secondary | ICD-10-CM

## 2022-05-08 NOTE — Therapy (Signed)
OUTPATIENT PHYSICAL THERAPY LSVT TREATMENT NOTE   Patient Name: Tyler Glover MRN: [id number] DOB:[birth year], 62 y.o., male Today's Date: 05/08/2022  PCP: Juline Patch, MD REFERRING PROVIDER: Juline Patch, MD  END OF SESSION:  PT End of Session - 05/08/22 1730     Visit Number 3    Number of Visits 17    Date for PT Re-Evaluation 06/14/22   additional 2 weeks in case of need to reschedule   PT Start Time 1630    PT Stop Time 1725    PT Time Calculation (min) 55 min    Equipment Utilized During Treatment Gait belt    Activity Tolerance Patient tolerated treatment well    Behavior During Therapy WFL for tasks assessed/performed              Past Medical History:  Diagnosis Date   Allergy    Arthritis    Rheumatoid   GERD (gastroesophageal reflux disease)    Hypertension    Past Surgical History:  Procedure Laterality Date   COLONOSCOPY     COLONOSCOPY WITH PROPOFOL N/A 08/21/2019   Procedure: COLONOSCOPY WITH PROPOFOL;  Surgeon: Lucilla Lame, MD;  Location: [place];  Service: Endoscopy;  Laterality: N/A;  priority 4   ESOPHAGOGASTRODUODENOSCOPY (EGD) WITH PROPOFOL N/A 08/21/2019   Procedure: ESOPHAGOGASTRODUODENOSCOPY (EGD) WITH PROPOFOL;  Surgeon: Lucilla Lame, MD;  Location: [place];  Service: Endoscopy;  Laterality: N/A;   POLYPECTOMY  08/21/2019   Procedure: POLYPECTOMY;  Surgeon: Lucilla Lame, MD;  Location: [place];  Service: Endoscopy;;   SHOULDER SURGERY     TESTICLE SURGERY     age 89 or 66   TONSILLECTOMY     Patient Active Problem List   Diagnosis Date Noted   Encounter for screening colonoscopy    Polyp of transverse colon    Heartburn    Pure hypercholesterolemia 01/26/2016   Right lateral epicondylitis 07/07/2015   Vitamin D deficiency 12/24/2014   Prediabetes 12/24/2014   Allergic rhinitis, seasonal 06/03/2014   Acid reflux 06/03/2014   Essential (primary) hypertension 06/03/2014   Family history of  diabetes mellitus 06/03/2014   Family history of cardiac disorder 06/03/2014   Obesity (BMI 30.0-34.9) 06/03/2014    ONSET DATE: 2 years ago  REFERRING DIAG: G20.C (ICD-10-CM) - Parkinsonism, unspecified   THERAPY DIAG:  Other lack of coordination  Difficulty in walking, not elsewhere classified  Unsteadiness on feet  Rationale for Evaluation and Treatment: Rehabilitation  SUBJECTIVE:  SUBJECTIVE STATEMENT: Pt reports no recent stumbles/LOB. Performed HEP. Reports he is a bit sore following last session. No other concerns/updates.   Pt accompanied by: self  PERTINENT HISTORY:   Pt is a pleasant 62 yo male presenting for LSVT BIG evaluation. He is ambulating without an AD. 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 He initially noticed difficulty with LLE and LUE, with LUE shaking. Pt was taking sinemet but reports this was discontinued after recent doctor appointment due to unwanted symptoms while on it. He has since changed to a new medication that he feels is helping.  He reports no falls within the past year.  He has noticed his gait has slowed down. He reports he has a L side limp. He takes more time to complete ADLs. 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 One of his goals is to improve his ability to work on cars as well as to improve his walking. PMH significant for rheumatoid arthritis, HTN, hx of shoulder surgery (date?)  PAIN:  Are you having pain? Yes: NPRS scale: 3-4/10 Pain location: low back and mid-upper back, bilateral hands (chronic) Pain description:   Aggravating factors:   Relieving factors:    PRECAUTIONS: Fall  WEIGHT BEARING RESTRICTIONS: No  FALLS: Has patient  fallen in last 6 months? No  LIVING ENVIRONMENT: Lives with:  Lives in:  Stairs:  Has following equipment at home:   PLOF: Independent  PATIENT GOALS: He would like to improve his mobility of L side to improve his ability to work on cars with students, improve gait   Imaging: via chart MR brain 12/29/21: IMPRESSION: 1. Normal brain MRI. 2. NeuroQuant volumetric analysis of the brain, see details on [employer].    OBJECTIVE:    Assessment:     MMT:  LE strength grossly 4+/5 BLE    Coordination/Cerebellar: Finger to Nose: WFL Heel to Shin: Impaired:  slight decrease in speed LLE Rapid alternating movements: Impaired: LUE   Sensation: Impaired: reports decreased sensation LLE and LUE compared to RLE and RUE      Sit to stand assessment:  5x STS: 14.22 sec hands-free   Gait assessments:  10 MWT:  1.01 m/s  L foot scuff/decreased heel strike, absent l arm swing, lack of trunk rotation, slight crouched posture    Functional testing: Doffing shoes (bilateral): 27.5 seconds (must use UE to assist with crossing LLE ), performed seated  Donning shoes (bilateral): 68 seconds, performed seated. Notable decrease in speed with tying shoe laces, pt reports mostly because of LUE  Handwriting assessment: Pt R handed. WNL. Clear, big writing. Pt reports this has improved since medication change.  Coin stacking (6 coins): with RUE 12.5 seconds, LUE 30.5 seconds  Getting in and out of bed: in (using mat table) 30.5 seconds, out of bed 9 seconds. Uses forward approach to mat table>quadruped>roll>supine (significant decreased speed with roll). Pt reports he must use his R leg mostly to assist with bed mobility   Balance Assessments: SLB:  5 seconds LLE, 10 seconds RLE Able to clear obstacle (orange hurdle)   Ascending/descending steps: reciprocal pattern ascend/descend UUE support     PATIENT SURVEYS:  FOTO 81   PATIENT EDUCATION:  Education details:  HEP, exercise  technique, details of LSVT BIG protocol Person educated: Patient Education method: Explanation, Demonstration, Tactile cues, Verbal cues, and Handouts Education comprehension: verbalized understanding, returned demonstration, verbal  cues required, tactile cues required, and needs further education   TODAY'S TREATMENT:                                                                                                                                         DATE: 05/07/2022   LSVT BIG Treatment:  Patient seen for LSVT Daily Session Maximal Daily Exercises for facilitation/coordination of movement Sustained movements are designed to rescale the amplitude of movement output for generalization to daily functional activities.  Maximal Daily Exercises: Floor to Ceiling 8 with 10 second hold Side to Side 8 Bilateral with 10 second hold * able to increase speed of movement with cuing to LUE Forward Step and Reach 10 Bilateral Sideways Step and Reach 10 Bilateral Backwards Step and Reach 10 Bilateral Forwards Rock and Reach 10 Bilateral Sideways Rock and Reach 10 Bilateral  Comments: Rates interventions as challenging, rates 5 and 6 most challenging of the maximal daily exercises. Still fatiguing. Requires 95% cuing/modeling today.    Functional Component movements:  5 reps of each of the following Sit to stand (required) Rolling side-lying<>supine each direction SLB LLE (30 sec reps) Stack 6 coins LUE Donning shoes Comments: Improved ability to don shoes/increased speed of moving, indicating carryover between sessions. Able to also improve speed with coin stacking but intermittently   Hierarchies:  Getting in and out of bed 1x through; including instruction on scooting/bridging today 2. Ambulating over uneven surfaces/obstacles with no AD- incorporated into Gait (big walking), use of obstacle to promote obstacle clearance and mat for variation in surface/uneven surface 5 1x148 ft see below for  more details  Gait - performance and progress: 5 laps of 148 ft with stepping over obstacle, onto and off of mat, hands-on assist for L shoulder ext with arm swing, cuing for upright posture, opening L hand, sequencing, breathing technique Cuing provided to override hypokinesia/bradykinesia. PT provides SBA throughout.  Carryover Assignment: Pt instructed to use big effort with STS from cushioned surface such as sofa or armchair   Homework: Pt to perform all 7 LSVT BIG exercises, 5 Functional Component Tasks, carryover task and big walking.    HOME EXERCISE PROGRAM:  LSVT BIG maximal daily exercises, functional component, hierarchy tasks and carryover task to be initiated next visit  GOALS: Goals reviewed with patient? Yes   SHORT TERM GOALS: Target date: 05/23/2022    Patient will be independent in home exercise program to improve strength/mobility for better functional independence with ADLs. Baseline: to be initiated next visit Goal status: INITIAL   LONG TERM GOALS: Target date: 06/13/2022    Patient will increase FOTO score to equal to or greater than  86   to demonstrate improvement in mobility and quality of life.  Baseline: 81 Goal status: INITIAL  2.  Patient (< 38 years old) will complete five times sit to stand test in < 10 seconds indicating an increased  LE strength and improved balance. Baseline: 14.22 sec Goal status: INITIAL  3.  Patient will increase LLE SLB to at least 10 seconds to indicate improved balance/ability to safely clear obstacles and ambulate Baseline: LLE 5 seconds Goal status: INITIAL  4.  Patient will increase 10 meter walk test to >1.1 m/s as to improve gait speed for better community ambulation and to reduce fall risk. Baseline:  1.01 m/s Goal status: INITIAL   6.  Pt will improve ability to don both shoes while seated by at least 10 seconds in order to demonstrate improved functional mobility of BUE and increased ease in  dressing. Baseline: 68 seconds Goal status: INITIAL   ASSESSMENT:  CLINICAL IMPRESSION: Pt already shows some carryover between sessions with improved speed of donning shoes and intermittently with coin stacking. Instructed in scooting/bridging today to promote ease with bed mobility. Continued use of frequent cues for LUE shoulder ext and finger abduction with interventions. The pt will benefit from LSVT BIG PT to address and improve these impairments to improve strength, gait, balance and mobility.   OBJECTIVE IMPAIRMENTS: Abnormal gait, decreased balance, decreased coordination, decreased knowledge of use of DME, decreased mobility, difficulty walking, decreased ROM, decreased strength, impaired sensation, impaired UE functional use, improper body mechanics, postural dysfunction, and pain.   ACTIVITY LIMITATIONS: carrying, squatting, stairs, transfers, bed mobility, dressing, and locomotion level  PARTICIPATION LIMITATIONS: meal prep, cleaning, laundry, shopping, community activity, occupation, and yard work  PERSONAL FACTORS: Age, Time since onset of injury/illness/exacerbation, and 1-2 comorbidities: rheumatoid arthritis, HTN, hx of shoulder surgery (date?)  are also affecting patient's functional outcome.   REHAB POTENTIAL: Good  CLINICAL DECISION MAKING: Evolving/moderate complexity  EVALUATION COMPLEXITY: Moderate  PLAN:  PT FREQUENCY: 4x/week  PT DURATION: 6 weeks  PLANNED INTERVENTIONS: Therapeutic exercises, Therapeutic activity, Neuromuscular re-education, Balance training, Gait training, Patient/Family education, Self Care, Joint mobilization, Stair training, Vestibular training, Canalith repositioning, Orthotic/Fit training, DME instructions, Electrical stimulation, Wheelchair mobility training, Spinal mobilization, Cryotherapy, Moist heat, Splintting, Taping, Traction, Manual therapy, and Re-evaluation  PLAN FOR NEXT SESSION: LVST BIG interventions, progress as pt  able   Zollie Pee, PT 05/08/2022, 5:35 PM

## 2022-05-09 ENCOUNTER — Ambulatory Visit: Payer: 59

## 2022-05-09 DIAGNOSIS — R278 Other lack of coordination: Secondary | ICD-10-CM | POA: Diagnosis not present

## 2022-05-09 DIAGNOSIS — R262 Difficulty in walking, not elsewhere classified: Secondary | ICD-10-CM

## 2022-05-09 DIAGNOSIS — R2681 Unsteadiness on feet: Secondary | ICD-10-CM

## 2022-05-09 NOTE — Therapy (Signed)
OUTPATIENT PHYSICAL THERAPY LSVT TREATMENT NOTE   Patient Name: Tyler Glover MRN: [id number] DOB:1960/06/27, 62 y.o., male Today's Date: 05/09/2022  PCP: Juline Patch, MD REFERRING PROVIDER: Vladimir Crofts, MD  END OF SESSION:  PT End of Session - 05/09/22 1745     Visit Number 4    Number of Visits 17    Date for PT Re-Evaluation 06/14/22   additional 2 weeks in case of need to reschedule   PT Start Time 1642    PT Stop Time 1740    PT Time Calculation (min) 58 min    Equipment Utilized During Treatment Gait belt    Activity Tolerance Patient tolerated treatment well    Behavior During Therapy WFL for tasks assessed/performed              Past Medical History:  Diagnosis Date   Allergy    Arthritis    Rheumatoid   GERD (gastroesophageal reflux disease)    Hypertension    Past Surgical History:  Procedure Laterality Date   COLONOSCOPY     COLONOSCOPY WITH PROPOFOL N/A 08/21/2019   Procedure: COLONOSCOPY WITH PROPOFOL;  Surgeon: Lucilla Lame, MD;  Location: [place];  Service: Endoscopy;  Laterality: N/A;  priority 4   ESOPHAGOGASTRODUODENOSCOPY (EGD) WITH PROPOFOL N/A 08/21/2019   Procedure: ESOPHAGOGASTRODUODENOSCOPY (EGD) WITH PROPOFOL;  Surgeon: Lucilla Lame, MD;  Location: [place];  Service: Endoscopy;  Laterality: N/A;   POLYPECTOMY  08/21/2019   Procedure: POLYPECTOMY;  Surgeon: Lucilla Lame, MD;  Location: [place];  Service: Endoscopy;;   SHOULDER SURGERY     TESTICLE SURGERY     age 62 or 62   TONSILLECTOMY     Patient Active Problem List   Diagnosis Date Noted   Encounter for screening colonoscopy    Polyp of transverse colon    Heartburn    Pure hypercholesterolemia 01/26/2016   Right lateral epicondylitis 07/07/2015   Vitamin D deficiency 12/24/2014   Prediabetes 12/24/2014   Allergic rhinitis, seasonal 06/03/2014   Acid reflux 06/03/2014   Essential (primary) hypertension 06/03/2014   Family history of  diabetes mellitus 06/03/2014   Family history of cardiac disorder 06/03/2014   Obesity (BMI 30.0-34.9) 06/03/2014    ONSET DATE: 2 years ago  REFERRING DIAG: G20.C (ICD-10-CM) - Parkinsonism, unspecified   THERAPY DIAG:  Other lack of coordination  Difficulty in walking, not elsewhere classified  Unsteadiness on feet  Rationale for Evaluation and Treatment: Rehabilitation  SUBJECTIVE:  SUBJECTIVE STATEMENT: Pt reports no recent stumbles/LOB. Performed HEP, went well. No new pains.   Pt accompanied by: self  PERTINENT HISTORY:   Pt is a pleasant 62 yo male presenting for LSVT BIG evaluation. He is ambulating without an AD. 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 He initially noticed difficulty with LLE and LUE, with LUE shaking. Pt was taking sinemet but reports this was discontinued after recent doctor appointment due to unwanted symptoms while on it. He has since changed to a new medication that he feels is helping.  He reports no falls within the past year.  He has noticed his gait has slowed down. He reports he has a L side limp. He takes more time to complete ADLs. 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 One of his goals is to improve his ability to work on cars as well as to improve his walking. PMH significant for rheumatoid arthritis, HTN, hx of shoulder surgery (date?)  PAIN:  Are you having pain? Yes: NPRS scale: 3-4/10 Pain location: low back and mid-upper back, bilateral hands (chronic) Pain description:   Aggravating factors:   Relieving factors:    PRECAUTIONS: Fall  WEIGHT BEARING RESTRICTIONS: No  FALLS: Has patient fallen in last 6 months? No  LIVING  ENVIRONMENT: Lives with:  Lives in:  Stairs:  Has following equipment at home:   PLOF: Independent  PATIENT GOALS: He would like to improve his mobility of L side to improve his ability to work on cars with students, improve gait   Imaging: via chart MR brain 12/29/21: IMPRESSION: 1. Normal brain MRI. 2. NeuroQuant volumetric analysis of the brain, see details on [employer].    OBJECTIVE:    Assessment:     MMT:  LE strength grossly 4+/5 BLE    Coordination/Cerebellar: Finger to Nose: WFL Heel to Shin: Impaired:  slight decrease in speed LLE Rapid alternating movements: Impaired: LUE   Sensation: Impaired: reports decreased sensation LLE and LUE compared to RLE and RUE      Sit to stand assessment:  5x STS: 14.22 sec hands-free   Gait assessments:  10 MWT:  1.01 m/s  L foot scuff/decreased heel strike, absent l arm swing, lack of trunk rotation, slight crouched posture    Functional testing: Doffing shoes (bilateral): 27.5 seconds (must use UE to assist with crossing LLE ), performed seated  Donning shoes (bilateral): 68 seconds, performed seated. Notable decrease in speed with tying shoe laces, pt reports mostly because of LUE  Handwriting assessment: Pt R handed. WNL. Clear, big writing. Pt reports this has improved since medication change.  Coin stacking (6 coins): with RUE 12.5 seconds, LUE 30.5 seconds  Getting in and out of bed: in (using mat table) 30.5 seconds, out of bed 9 seconds. Uses forward approach to mat table>quadruped>roll>supine (significant decreased speed with roll). Pt reports he must use his R leg mostly to assist with bed mobility   Balance Assessments: SLB:  5 seconds LLE, 10 seconds RLE Able to clear obstacle (orange hurdle)   Ascending/descending steps: reciprocal pattern ascend/descend UUE support     PATIENT SURVEYS:  FOTO 81   PATIENT EDUCATION:  Education details:  HEP, exercise technique, details of LSVT BIG  protocol Person educated: Patient Education method: Explanation, Demonstration, Tactile cues, Verbal cues, and Handouts Education comprehension: verbalized understanding, returned demonstration, verbal cues required, tactile cues required, and needs  further education   TODAY'S TREATMENT:                                                                                                                                         DATE: 05/07/2022   LSVT BIG Treatment:  Patient seen for LSVT Daily Session Maximal Daily Exercises for facilitation/coordination of movement Sustained movements are designed to rescale the amplitude of movement output for generalization to daily functional activities.  Maximal Daily Exercises: Floor to Ceiling 8 with 10 second hold Side to Side 8 Bilateral with 10 second hold * rates L side medium to medium-hard Forward Step and Reach 10 Bilateral Sideways Step and Reach 10 Bilateral Backwards Step and Reach 10 Bilateral Forwards Rock and Reach 10 Bilateral Sideways Rock and Reach 10 Bilateral  Comments: Rates interventions as challenging with L side. Requires 75% cuing/modeling today.    Functional Component movements:  5 reps of each of the following Sit to stand (required)  Rolling side-lying<>supine each direction SLB LLE (30 sec reps) Stack 6 coins LUE Donning shoes Comments: Pt exhibits independence with STS technique, good speed and amplitude of movement.   Hierarchies:  Getting in and out of bed 1x through with additional focus on rolling, big STS from bed  2. Ambulating over uneven surfaces/obstacles with no AD- incorporated into Gait (big walking) see gait below for full details  Gait -  3 laps over change in surface (floor to mat) then around 2 cones and step over shoe-box. Pt then performed additional 3 laps holding PVC in each hand with PT holding PVC assisting pt into increased arm swing, focus L shoulder ext (shoe box and cones removed).  3  remaining laps performed with focus on step-length, upright posture  1 lap =148 ft  Cuing provided to override hypokinesia/bradykinesia, focus on L side. PT provides SBA throughout.  Carryover Assignment: Big open L hand to open door  Homework: Pt to perform all 7 LSVT BIG exercises, 5 Functional Component Tasks, carryover task and big walking.    HOME EXERCISE PROGRAM:  LSVT BIG maximal daily exercises, functional component, hierarchy tasks and carryover task to be initiated next visit  GOALS: Goals reviewed with patient? Yes   SHORT TERM GOALS: Target date: 05/23/2022    Patient will be independent in home exercise program to improve strength/mobility for better functional independence with ADLs. Baseline: to be initiated next visit Goal status: INITIAL   LONG TERM GOALS: Target date: 06/13/2022    Patient will increase FOTO score to equal to or greater than  86   to demonstrate improvement in mobility and quality of life.  Baseline: 81 Goal status: INITIAL  2.  Patient (< 66 years old) will complete five times sit to stand test in < 10 seconds indicating an increased LE strength and improved balance. Baseline: 14.22 sec Goal status: INITIAL  3.  Patient will increase LLE  SLB to at least 10 seconds to indicate improved balance/ability to safely clear obstacles and ambulate Baseline: LLE 5 seconds Goal status: INITIAL  4.  Patient will increase 10 meter walk test to >1.1 m/s as to improve gait speed for better community ambulation and to reduce fall risk. Baseline:  1.01 m/s Goal status: INITIAL   6.  Pt will improve ability to don both shoes while seated by at least 10 seconds in order to demonstrate improved functional mobility of BUE and increased ease in dressing. Baseline: 68 seconds Goal status: INITIAL   ASSESSMENT:  CLINICAL IMPRESSION: Pt exhibits carryover between session requiring less cuing for technique with maximal daily exercises and functional  component tasks. Pt with improved ability to sustain increased speed of movement, although he does continue to require frequent VC/TC to LUE to make such adjustments. Plan to progress maximal daily exercises next visit by increasing reps. The pt will benefit from LSVT BIG PT to address and improve these impairments to improve strength, gait, balance and mobility.   OBJECTIVE IMPAIRMENTS: Abnormal gait, decreased balance, decreased coordination, decreased knowledge of use of DME, decreased mobility, difficulty walking, decreased ROM, decreased strength, impaired sensation, impaired UE functional use, improper body mechanics, postural dysfunction, and pain.   ACTIVITY LIMITATIONS: carrying, squatting, stairs, transfers, bed mobility, dressing, and locomotion level  PARTICIPATION LIMITATIONS: meal prep, cleaning, laundry, shopping, community activity, occupation, and yard work  PERSONAL FACTORS: Age, Time since onset of injury/illness/exacerbation, and 1-2 comorbidities: rheumatoid arthritis, HTN, hx of shoulder surgery (date?)  are also affecting patient's functional outcome.   REHAB POTENTIAL: Good  CLINICAL DECISION MAKING: Evolving/moderate complexity  EVALUATION COMPLEXITY: Moderate  PLAN:  PT FREQUENCY: 4x/week  PT DURATION: 6 weeks  PLANNED INTERVENTIONS: Therapeutic exercises, Therapeutic activity, Neuromuscular re-education, Balance training, Gait training, Patient/Family education, Self Care, Joint mobilization, Stair training, Vestibular training, Canalith repositioning, Orthotic/Fit training, DME instructions, Electrical stimulation, Wheelchair mobility training, Spinal mobilization, Cryotherapy, Moist heat, Splintting, Taping, Traction, Manual therapy, and Re-evaluation  PLAN FOR NEXT SESSION: LVST BIG interventions, progress as pt able   Zollie Pee, PT 05/09/2022, 5:47 PM

## 2022-05-10 ENCOUNTER — Ambulatory Visit: Payer: 59

## 2022-05-10 DIAGNOSIS — R262 Difficulty in walking, not elsewhere classified: Secondary | ICD-10-CM

## 2022-05-10 DIAGNOSIS — R278 Other lack of coordination: Secondary | ICD-10-CM | POA: Diagnosis not present

## 2022-05-10 NOTE — Therapy (Signed)
OUTPATIENT PHYSICAL THERAPY LSVT TREATMENT NOTE   Patient Name: Ryin Alston MRN: [id number] DOB:07/12/1960, 62 y.o., male Today's Date: 05/10/2022  PCP: Juline Patch, MD REFERRING PROVIDER: Vladimir Crofts, MD  END OF SESSION:  PT End of Session - 05/10/22 1731     Visit Number 5    Number of Visits 17    Date for PT Re-Evaluation 06/14/22   additional 2 weeks in case of need to reschedule   PT Start Time 1630    PT Stop Time 1726    PT Time Calculation (min) 56 min    Equipment Utilized During Treatment Gait belt    Activity Tolerance Patient tolerated treatment well    Behavior During Therapy WFL for tasks assessed/performed              Past Medical History:  Diagnosis Date   Allergy    Arthritis    Rheumatoid   GERD (gastroesophageal reflux disease)    Hypertension    Past Surgical History:  Procedure Laterality Date   COLONOSCOPY     COLONOSCOPY WITH PROPOFOL N/A 08/21/2019   Procedure: COLONOSCOPY WITH PROPOFOL;  Surgeon: Lucilla Lame, MD;  Location: [place];  Service: Endoscopy;  Laterality: N/A;  priority 4   ESOPHAGOGASTRODUODENOSCOPY (EGD) WITH PROPOFOL N/A 08/21/2019   Procedure: ESOPHAGOGASTRODUODENOSCOPY (EGD) WITH PROPOFOL;  Surgeon: Lucilla Lame, MD;  Location: [place];  Service: Endoscopy;  Laterality: N/A;   POLYPECTOMY  08/21/2019   Procedure: POLYPECTOMY;  Surgeon: Lucilla Lame, MD;  Location: [place];  Service: Endoscopy;;   SHOULDER SURGERY     TESTICLE SURGERY     age 66 or 47   TONSILLECTOMY     Patient Active Problem List   Diagnosis Date Noted   Encounter for screening colonoscopy    Polyp of transverse colon    Heartburn    Pure hypercholesterolemia 01/26/2016   Right lateral epicondylitis 07/07/2015   Vitamin D deficiency 12/24/2014   Prediabetes 12/24/2014   Allergic rhinitis, seasonal 06/03/2014   Acid reflux 06/03/2014   Essential (primary) hypertension 06/03/2014   Family history of  diabetes mellitus 06/03/2014   Family history of cardiac disorder 06/03/2014   Obesity (BMI 30.0-34.9) 06/03/2014    ONSET DATE: 2 years ago  REFERRING DIAG: G20.C (ICD-10-CM) - Parkinsonism, unspecified   THERAPY DIAG:  Other lack of coordination  Difficulty in walking, not elsewhere classified  Rationale for Evaluation and Treatment: Rehabilitation  SUBJECTIVE:  SUBJECTIVE STATEMENT: Pt reports no recent stumbles/LOB.  Has been performing HEP, unsure of his STS technique. No other updates.    Pt accompanied by: self  PERTINENT HISTORY:   Pt is a pleasant 62 yo male presenting for LSVT BIG evaluation. He is ambulating without an AD. 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 He initially noticed difficulty with LLE and LUE, with LUE shaking. Pt was taking sinemet but reports this was discontinued after recent doctor appointment due to unwanted symptoms while on it. He has since changed to a new medication that he feels is helping.  He reports no falls within the past year.  He has noticed his gait has slowed down. He reports he has a L side limp. He takes more time to complete ADLs. 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 One of his goals is to improve his ability to work on cars as well as to improve his walking. PMH significant for rheumatoid arthritis, HTN, hx of shoulder surgery (date?)  PAIN:  Are you having pain? Yes: NPRS scale: 3-4/10 Pain location: low back and mid-upper back, bilateral hands (chronic) Pain description:   Aggravating factors:   Relieving factors:    PRECAUTIONS: Fall  WEIGHT BEARING RESTRICTIONS: No  FALLS: Has patient fallen in last 6 months? No  LIVING  ENVIRONMENT: Lives with:  Lives in:  Stairs:  Has following equipment at home:   PLOF: Independent  PATIENT GOALS: He would like to improve his mobility of L side to improve his ability to work on cars with students, improve gait   Imaging: via chart MR brain 12/29/21: IMPRESSION: 1. Normal brain MRI. 2. NeuroQuant volumetric analysis of the brain, see details on [employer].    OBJECTIVE:    Assessment:     MMT:  LE strength grossly 4+/5 BLE    Coordination/Cerebellar: Finger to Nose: WFL Heel to Shin: Impaired:  slight decrease in speed LLE Rapid alternating movements: Impaired: LUE   Sensation: Impaired: reports decreased sensation LLE and LUE compared to RLE and RUE      Sit to stand assessment:  5x STS: 14.22 sec hands-free   Gait assessments:  10 MWT:  1.01 m/s  L foot scuff/decreased heel strike, absent l arm swing, lack of trunk rotation, slight crouched posture    Functional testing: Doffing shoes (bilateral): 27.5 seconds (must use UE to assist with crossing LLE ), performed seated  Donning shoes (bilateral): 68 seconds, performed seated. Notable decrease in speed with tying shoe laces, pt reports mostly because of LUE  Handwriting assessment: Pt R handed. WNL. Clear, big writing. Pt reports this has improved since medication change.  Coin stacking (6 coins): with RUE 12.5 seconds, LUE 30.5 seconds  Getting in and out of bed: in (using mat table) 30.5 seconds, out of bed 9 seconds. Uses forward approach to mat table>quadruped>roll>supine (significant decreased speed with roll). Pt reports he must use his R leg mostly to assist with bed mobility   Balance Assessments: SLB:  5 seconds LLE, 10 seconds RLE Able to clear obstacle (orange hurdle)   Ascending/descending steps: reciprocal pattern ascend/descend UUE support     PATIENT SURVEYS:  FOTO 81   PATIENT EDUCATION:  Education details:  HEP, exercise technique, details of LSVT BIG  protocol Person educated: Patient Education method: Explanation, Demonstration, Tactile cues, Verbal cues, and Handouts Education comprehension: verbalized understanding, returned demonstration, verbal  cues required, tactile cues required, and needs further education   TODAY'S TREATMENT:                                                                                                                                         DATE: 05/10/2022   LSVT BIG Treatment:  Patient seen for LSVT Daily Session Maximal Daily Exercises for facilitation/coordination of movement Sustained movements are designed to rescale the amplitude of movement output for generalization to daily functional activities.  Maximal Daily Exercises: Floor to Ceiling 10 with 10 second holds. Rates medium-hard Side to Side 10 Bilateral with 10 second hold. Rates medium-hard  Forward Step and Reach 10 Bilateral Sideways Step and Reach 10 Bilateral Backwards Step and Reach 10 Bilateral Forwards Rock and Reach 10 Bilateral Sideways Rock and Reach 10 Bilateral  Comments: Able to progress first 2 maximal daily exercises with increased reps. Requires 75% cuing/modeling today, focus L hand/UE    Functional Component movements:  5 reps of each of the following Sit to stand (required)  Rolling side-lying<>supine each direction SLB LLE (30 sec reps) Stack 6 coins LUE - emphasis on use of finger flicks  Donning shoes - cuing for use of finger flicks prior to tying shoes  Comments: Emphasis on use of finger flicks to improve L hand function   Hierarchies:  Getting in and out of bed 2x through with additional focus on rolling, big STS from bed, scooting/bridging 2. Ambulating over uneven surfaces/obstacles with no AD- incorporated into Gait (big walking) see gait below for full details, continued emphasis on arm-swing, providing use of PVC to assist L shoulder ext  Gait -  3 laps over change in surface (floor to mat) then around 3  cones x 444 ft holding PVC in each hand with PT holding PVC assisting pt into increased arm swing, focus L shoulder ext.  4 remaining laps performed with focus on step-length, upright posture, with 2 obstacles added for pt to step over. 1 lap =148 ft  Cuing provided to override hypokinesia/bradykinesia, focus on L side. PT provides SBA throughout. Pt with some difficulty navigating cones, knocks over cone 2x  Carryover Assignment: For the weekend: Big technique folding towels, big twist of screw driver when putting together chair, stand with big posture when entering your home, big step over curb   Homework: Pt to perform all 7 LSVT BIG exercises, 5 Functional Component Tasks, carryover task and big walking.    HOME EXERCISE PROGRAM:  LSVT BIG maximal daily exercises, functional component, hierarchy tasks and carryover task to be initiated next visit  GOALS: Goals reviewed with patient? Yes   SHORT TERM GOALS: Target date: 05/23/2022    Patient will be independent in home exercise program to improve strength/mobility for better functional independence with ADLs. Baseline: to be initiated next visit Goal status: INITIAL   LONG TERM GOALS: Target date: 06/13/2022  Patient will increase FOTO score to equal to or greater than  86   to demonstrate improvement in mobility and quality of life.  Baseline: 81 Goal status: INITIAL  2.  Patient (< 55 years old) will complete five times sit to stand test in < 10 seconds indicating an increased LE strength and improved balance. Baseline: 14.22 sec Goal status: INITIAL  3.  Patient will increase LLE SLB to at least 10 seconds to indicate improved balance/ability to safely clear obstacles and ambulate Baseline: LLE 5 seconds Goal status: INITIAL  4.  Patient will increase 10 meter walk test to >1.1 m/s as to improve gait speed for better community ambulation and to reduce fall risk. Baseline:  1.01 m/s Goal status: INITIAL   6.  Pt  will improve ability to don both shoes while seated by at least 10 seconds in order to demonstrate improved functional mobility of BUE and increased ease in dressing. Baseline: 68 seconds Goal status: INITIAL   ASSESSMENT:  CLINICAL IMPRESSION: Pt shows further improvement with activity tolerance and is able to progress maximal daily exercises by completing 2 additional reps for MDE 1 and 2. PT still providing increased cuing for function of LUE and LLE to improve amplitude of movement, LUE digit abduction and L shoulder extension, as these are the first areas to exhibit impairment when pt fatigues or must focus on multiple tasks. The pt will benefit from LSVT BIG PT to address and improve these impairments to improve strength, gait, balance and mobility.   OBJECTIVE IMPAIRMENTS: Abnormal gait, decreased balance, decreased coordination, decreased knowledge of use of DME, decreased mobility, difficulty walking, decreased ROM, decreased strength, impaired sensation, impaired UE functional use, improper body mechanics, postural dysfunction, and pain.   ACTIVITY LIMITATIONS: carrying, squatting, stairs, transfers, bed mobility, dressing, and locomotion level  PARTICIPATION LIMITATIONS: meal prep, cleaning, laundry, shopping, community activity, occupation, and yard work  PERSONAL FACTORS: Age, Time since onset of injury/illness/exacerbation, and 1-2 comorbidities: rheumatoid arthritis, HTN, hx of shoulder surgery (date?)  are also affecting patient's functional outcome.   REHAB POTENTIAL: Good  CLINICAL DECISION MAKING: Evolving/moderate complexity  EVALUATION COMPLEXITY: Moderate  PLAN:  PT FREQUENCY: 4x/week  PT DURATION: 6 weeks  PLANNED INTERVENTIONS: Therapeutic exercises, Therapeutic activity, Neuromuscular re-education, Balance training, Gait training, Patient/Family education, Self Care, Joint mobilization, Stair training, Vestibular training, Canalith repositioning, Orthotic/Fit  training, DME instructions, Electrical stimulation, Wheelchair mobility training, Spinal mobilization, Cryotherapy, Moist heat, Splintting, Taping, Traction, Manual therapy, and Re-evaluation  PLAN FOR NEXT SESSION: LVST BIG interventions, progress as pt able   Zollie Pee, PT 05/10/2022, 5:37 PM

## 2022-05-14 ENCOUNTER — Ambulatory Visit: Payer: 59 | Attending: Neurology

## 2022-05-14 DIAGNOSIS — R2681 Unsteadiness on feet: Secondary | ICD-10-CM | POA: Insufficient documentation

## 2022-05-14 DIAGNOSIS — M6281 Muscle weakness (generalized): Secondary | ICD-10-CM | POA: Insufficient documentation

## 2022-05-14 DIAGNOSIS — R278 Other lack of coordination: Secondary | ICD-10-CM | POA: Insufficient documentation

## 2022-05-14 DIAGNOSIS — R262 Difficulty in walking, not elsewhere classified: Secondary | ICD-10-CM | POA: Diagnosis present

## 2022-05-14 NOTE — Therapy (Signed)
OUTPATIENT PHYSICAL THERAPY LSVT TREATMENT NOTE   Patient Name: Tyler Glover MRN: [id number] DOB:1960/10/30, 62 y.o., male Today's Date: 05/14/2022  PCP: Juline Patch, MD REFERRING PROVIDER: Vladimir Crofts, MD  END OF SESSION:  PT End of Session - 05/14/22 1731     Visit Number 6    Number of Visits 17    Date for PT Re-Evaluation 06/14/22   additional 2 weeks in case of need to reschedule   PT Start Time 1635    PT Stop Time 1730    PT Time Calculation (min) 55 min    Equipment Utilized During Treatment Gait belt    Activity Tolerance Patient tolerated treatment well    Behavior During Therapy WFL for tasks assessed/performed              Past Medical History:  Diagnosis Date   Allergy    Arthritis    Rheumatoid   GERD (gastroesophageal reflux disease)    Hypertension    Past Surgical History:  Procedure Laterality Date   COLONOSCOPY     COLONOSCOPY WITH PROPOFOL N/A 08/21/2019   Procedure: COLONOSCOPY WITH PROPOFOL;  Surgeon: Lucilla Lame, MD;  Location: [place];  Service: Endoscopy;  Laterality: N/A;  priority 4   ESOPHAGOGASTRODUODENOSCOPY (EGD) WITH PROPOFOL N/A 08/21/2019   Procedure: ESOPHAGOGASTRODUODENOSCOPY (EGD) WITH PROPOFOL;  Surgeon: Lucilla Lame, MD;  Location: [place];  Service: Endoscopy;  Laterality: N/A;   POLYPECTOMY  08/21/2019   Procedure: POLYPECTOMY;  Surgeon: Lucilla Lame, MD;  Location: [place];  Service: Endoscopy;;   SHOULDER SURGERY     TESTICLE SURGERY     age 35 or 80   TONSILLECTOMY     Patient Active Problem List   Diagnosis Date Noted   Encounter for screening colonoscopy    Polyp of transverse colon    Heartburn    Pure hypercholesterolemia 01/26/2016   Right lateral epicondylitis 07/07/2015   Vitamin D deficiency 12/24/2014   Prediabetes 12/24/2014   Allergic rhinitis, seasonal 06/03/2014   Acid reflux 06/03/2014   Essential (primary) hypertension 06/03/2014   Family history of  diabetes mellitus 06/03/2014   Family history of cardiac disorder 06/03/2014   Obesity (BMI 30.0-34.9) 06/03/2014    ONSET DATE: 2 years ago  REFERRING DIAG: G20.C (ICD-10-CM) - Parkinsonism, unspecified   THERAPY DIAG:  Other lack of coordination  Difficulty in walking, not elsewhere classified  Unsteadiness on feet  Rationale for Evaluation and Treatment: Rehabilitation  SUBJECTIVE:  SUBJECTIVE STATEMENT: Pt reports no recent stumbles/LOB.  No pain currently. Feels it is easier now to get out of his chair. Has been performing HEP but not consistently over the weekend   Pt accompanied by: self  PERTINENT HISTORY:   Pt is a pleasant 62 yo male presenting for LSVT BIG evaluation. He is ambulating without an AD. 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 He initially noticed difficulty with LLE and LUE, with LUE shaking. Pt was taking sinemet but reports this was discontinued after recent doctor appointment due to unwanted symptoms while on it. He has since changed to a new medication that he feels is helping.  He reports no falls within the past year.  He has noticed his gait has slowed down. He reports he has a L side limp. He takes more time to complete ADLs. 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 One of his goals is to improve his ability to work on cars as well as to improve his walking. PMH significant for rheumatoid arthritis, HTN, hx of shoulder surgery (date?)  PAIN:  Are you having pain? Yes: NPRS scale: 3-4/10 Pain location: low back and mid-upper back, bilateral hands (chronic) Pain description:   Aggravating factors:   Relieving factors:    PRECAUTIONS: Fall  WEIGHT BEARING  RESTRICTIONS: No  FALLS: Has patient fallen in last 6 months? No  LIVING ENVIRONMENT: Lives with:  Lives in:  Stairs:  Has following equipment at home:   PLOF: Independent  PATIENT GOALS: He would like to improve his mobility of L side to improve his ability to work on cars with students, improve gait   Imaging: via chart MR brain 12/29/21: IMPRESSION: 1. Normal brain MRI. 2. NeuroQuant volumetric analysis of the brain, see details on [employer].    OBJECTIVE:    Assessment:     MMT:  LE strength grossly 4+/5 BLE    Coordination/Cerebellar: Finger to Nose: WFL Heel to Shin: Impaired:  slight decrease in speed LLE Rapid alternating movements: Impaired: LUE   Sensation: Impaired: reports decreased sensation LLE and LUE compared to RLE and RUE      Sit to stand assessment:  5x STS: 14.22 sec hands-free   Gait assessments:  10 MWT:  1.01 m/s  L foot scuff/decreased heel strike, absent l arm swing, lack of trunk rotation, slight crouched posture    Functional testing: Doffing shoes (bilateral): 27.5 seconds (must use UE to assist with crossing LLE ), performed seated  Donning shoes (bilateral): 68 seconds, performed seated. Notable decrease in speed with tying shoe laces, pt reports mostly because of LUE  Handwriting assessment: Pt R handed. WNL. Clear, big writing. Pt reports this has improved since medication change.  Coin stacking (6 coins): with RUE 12.5 seconds, LUE 30.5 seconds  Getting in and out of bed: in (using mat table) 30.5 seconds, out of bed 9 seconds. Uses forward approach to mat table>quadruped>roll>supine (significant decreased speed with roll). Pt reports he must use his R leg mostly to assist with bed mobility   Balance Assessments: SLB:  5 seconds LLE, 10 seconds RLE Able to clear obstacle (orange hurdle)   Ascending/descending steps: reciprocal pattern ascend/descend UUE support     PATIENT SURVEYS:  FOTO 81   PATIENT EDUCATION:   Education details:  HEP, exercise technique, details of LSVT BIG protocol Person educated: Patient Education method: Explanation, Demonstration, Tactile cues,  Verbal cues, and Handouts Education comprehension: verbalized understanding, returned demonstration, verbal cues required, tactile cues required, and needs further education   TODAY'S TREATMENT:                                                                                                                                         DATE: 05/10/2022   LSVT BIG Treatment:  Patient seen for LSVT Daily Session Maximal Daily Exercises for facilitation/coordination of movement Sustained movements are designed to rescale the amplitude of movement output for generalization to daily functional activities.  Maximal Daily Exercises: Floor to Ceiling 10 with 10 second holds.  Side to Side 10 Bilateral with 10 second hold.  Forward Step and Reach 12 Bilateral Sideways Step and Reach 12 Bilateral Backwards Step and Reach 10 Bilateral Forwards Rock and Reach 10 Bilateral Sideways Rock and Reach 10 Bilateral  Comments: Able to progress exercises 3 and 4 of maximal daily exercises with increased reps. Requires 60% cuing/modeling today, focus L hand/UE. Rates medium to medium-hard     Functional Component movements:  5 reps of each of the following Sit to stand (required)  Rolling side-lying<>supine each direction SLB LLE (30 sec reps) [place] 6 coins LUE - emphasis on use of finger flicks  Donning shoes - cuing for use of finger flicks prior to tying shoes  Comments: Indep with STS technique, exhibits increased speed. Pt reports his ability to get up from his chair at home has improved. Exhibits increased speed with donning shoes. Rolling still requires more hands-on assist/demo/vc to overcome bradykinetic movement   Hierarchies:  Getting in and out of bed 2x through with additional focus on rolling, big STS from bed, scooting/bridging 2.  Ambulating over uneven surfaces/obstacles with no AD- incorporated into Gait (big walking) see gait below for full details, continued emphasis on arm-swing, providing use of PVC to assist L shoulder ext, and additional focus on improving heel strike of LLE, uprigh posture  Gait -  3 laps over change in surface (floor to large red mat) and over 2 orange hrudles x approx  444 ft. Additional 5 laps with pt holding PVC in each hand with PT holding PVC assisting pt into increased arm swing, focus L shoulder ext, cuing ofr upright posture and focus on improving L heel strike. 1 lap =148 ft  Continued cuing provided to override hypokinesia/bradykinesia, focus on L side. PT provides SBA throughout.  Carryover Assignment: Big amplitude movement with DF for heel strike with gait   Homework: Pt to perform all 7 LSVT BIG exercises, 5 Functional Component Tasks, carryover task and big walking.    HOME EXERCISE PROGRAM:  LSVT BIG maximal daily exercises, functional component, hierarchy tasks and carryover task to be initiated next visit  GOALS: Goals reviewed with patient? Yes   SHORT TERM GOALS: Target date: 05/23/2022    Patient will be independent in home exercise program to  improve strength/mobility for better functional independence with ADLs. Baseline: to be initiated next visit Goal status: INITIAL   LONG TERM GOALS: Target date: 06/13/2022    Patient will increase FOTO score to equal to or greater than  86   to demonstrate improvement in mobility and quality of life.  Baseline: 81 Goal status: INITIAL  2.  Patient (< 32 years old) will complete five times sit to stand test in < 10 seconds indicating an increased LE strength and improved balance. Baseline: 14.22 sec Goal status: INITIAL  3.  Patient will increase LLE SLB to at least 10 seconds to indicate improved balance/ability to safely clear obstacles and ambulate Baseline: LLE 5 seconds Goal status: INITIAL  4.  Patient will  increase 10 meter walk test to >1.1 m/s as to improve gait speed for better community ambulation and to reduce fall risk. Baseline:  1.01 m/s Goal status: INITIAL   6.  Pt will improve ability to don both shoes while seated by at least 10 seconds in order to demonstrate improved functional mobility of BUE and increased ease in dressing. Baseline: 68 seconds Goal status: INITIAL   ASSESSMENT:  CLINICAL IMPRESSION: Pt continues to exhibit improvement, requiring decreased cuing with maximal daily exercises and further advancing reps. While pt shows progress, he reports difficulty ambulating over compliant surface and requires multi-modal cuing to improve L heel strike to avoid foot flat.The pt will benefit from LSVT BIG PT to address and improve these impairments to improve strength, gait, balance and mobility.   OBJECTIVE IMPAIRMENTS: Abnormal gait, decreased balance, decreased coordination, decreased knowledge of use of DME, decreased mobility, difficulty walking, decreased ROM, decreased strength, impaired sensation, impaired UE functional use, improper body mechanics, postural dysfunction, and pain.   ACTIVITY LIMITATIONS: carrying, squatting, stairs, transfers, bed mobility, dressing, and locomotion level  PARTICIPATION LIMITATIONS: meal prep, cleaning, laundry, shopping, community activity, occupation, and yard work  PERSONAL FACTORS: Age, Time since onset of injury/illness/exacerbation, and 1-2 comorbidities: rheumatoid arthritis, HTN, hx of shoulder surgery (date?)  are also affecting patient's functional outcome.   REHAB POTENTIAL: Good  CLINICAL DECISION MAKING: Evolving/moderate complexity  EVALUATION COMPLEXITY: Moderate  PLAN:  PT FREQUENCY: 4x/week  PT DURATION: 6 weeks  PLANNED INTERVENTIONS: Therapeutic exercises, Therapeutic activity, Neuromuscular re-education, Balance training, Gait training, Patient/Family education, Self Care, Joint mobilization, Stair training,  Vestibular training, Canalith repositioning, Orthotic/Fit training, DME instructions, Electrical stimulation, Wheelchair mobility training, Spinal mobilization, Cryotherapy, Moist heat, Splintting, Taping, Traction, Manual therapy, and Re-evaluation  PLAN FOR NEXT SESSION: LVST BIG interventions, progress as pt able   Zollie Pee, PT 05/14/2022, 5:36 PM

## 2022-05-15 ENCOUNTER — Ambulatory Visit: Payer: 59

## 2022-05-15 DIAGNOSIS — R278 Other lack of coordination: Secondary | ICD-10-CM | POA: Diagnosis not present

## 2022-05-15 DIAGNOSIS — R2681 Unsteadiness on feet: Secondary | ICD-10-CM

## 2022-05-15 DIAGNOSIS — R262 Difficulty in walking, not elsewhere classified: Secondary | ICD-10-CM

## 2022-05-15 NOTE — Therapy (Signed)
OUTPATIENT PHYSICAL THERAPY LSVT TREATMENT NOTE   Patient Name: Tyler Glover MRN: [id number] DOB:January 09, 1961, 62 y.o., male Today's Date: 05/15/2022  PCP: Juline Patch, MD REFERRING PROVIDER: Vladimir Crofts, MD  END OF SESSION:  PT End of Session - 05/15/22 1712     Visit Number 7    Number of Visits 17    Date for PT Re-Evaluation 06/14/22   additional 2 weeks in case of need to reschedule   PT Start Time 1522    PT Stop Time 1615    PT Time Calculation (min) 53 min    Equipment Utilized During Treatment Gait belt    Activity Tolerance Patient tolerated treatment well    Behavior During Therapy WFL for tasks assessed/performed              Past Medical History:  Diagnosis Date   Allergy    Arthritis    Rheumatoid   GERD (gastroesophageal reflux disease)    Hypertension    Past Surgical History:  Procedure Laterality Date   COLONOSCOPY     COLONOSCOPY WITH PROPOFOL N/A 08/21/2019   Procedure: COLONOSCOPY WITH PROPOFOL;  Surgeon: Lucilla Lame, MD;  Location: [facility];  Service: Endoscopy;  Laterality: N/A;  priority 4   ESOPHAGOGASTRODUODENOSCOPY (EGD) WITH PROPOFOL N/A 08/21/2019   Procedure: ESOPHAGOGASTRODUODENOSCOPY (EGD) WITH PROPOFOL;  Surgeon: Lucilla Lame, MD;  Location: [facility];  Service: Endoscopy;  Laterality: N/A;   POLYPECTOMY  08/21/2019   Procedure: POLYPECTOMY;  Surgeon: Lucilla Lame, MD;  Location: [facility];  Service: Endoscopy;;   SHOULDER SURGERY     TESTICLE SURGERY     age 65 or 34   TONSILLECTOMY     Patient Active Problem List   Diagnosis Date Noted   Encounter for screening colonoscopy    Polyp of transverse colon    Heartburn    Pure hypercholesterolemia 01/26/2016   Right lateral epicondylitis 07/07/2015   Vitamin D deficiency 12/24/2014   Prediabetes 12/24/2014   Allergic rhinitis, seasonal 06/03/2014   Acid reflux 06/03/2014   Essential (primary) hypertension 06/03/2014   Family history of  diabetes mellitus 06/03/2014   Family history of cardiac disorder 06/03/2014   Obesity (BMI 30.0-34.9) 06/03/2014    ONSET DATE: 2 years ago  REFERRING DIAG: G20.C (ICD-10-CM) - Parkinsonism, unspecified   THERAPY DIAG:  Other lack of coordination  Difficulty in walking, not elsewhere classified  Unsteadiness on feet  Rationale for Evaluation and Treatment: Rehabilitation  SUBJECTIVE:  SUBJECTIVE STATEMENT: Pt reports work activities are easier than before - crawling underneath car and getting back out is easier. Pt is sore today. He practiced his HEP   Pt accompanied by: self  PERTINENT HISTORY:   Pt is a pleasant 62 yo male presenting for LSVT BIG evaluation. He is ambulating without an AD. 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 He initially noticed difficulty with LLE and LUE, with LUE shaking. Pt was taking sinemet but reports this was discontinued after recent doctor appointment due to unwanted symptoms while on it. He has since changed to a new medication that he feels is helping.  He reports no falls within the past year.  He has noticed his gait has slowed down. He reports he has a L side limp. He takes more time to complete ADLs. 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 One of his goals is to improve his ability to work on cars as well as to improve his walking. PMH significant for rheumatoid arthritis, HTN, hx of shoulder surgery (date?)  PAIN:  Are you having pain? Yes: NPRS scale: 3-4/10 Pain location: low back and mid-upper back, bilateral hands (chronic) Pain description:   Aggravating factors:   Relieving factors:    PRECAUTIONS: Fall  WEIGHT BEARING RESTRICTIONS:  No  FALLS: Has patient fallen in last 6 months? No  LIVING ENVIRONMENT: Lives with:  Lives in:  Stairs:  Has following equipment at home:   PLOF: Independent  PATIENT GOALS: He would like to improve his mobility of L side to improve his ability to work on cars with students, improve gait   Imaging: via chart MR brain 12/29/21: IMPRESSION: 1. Normal brain MRI. 2. NeuroQuant volumetric analysis of the brain, see details on [employer].    OBJECTIVE:    Assessment:     MMT:  LE strength grossly 4+/5 BLE    Coordination/Cerebellar: Finger to Nose: WFL Heel to Shin: Impaired:  slight decrease in speed LLE Rapid alternating movements: Impaired: LUE   Sensation: Impaired: reports decreased sensation LLE and LUE compared to RLE and RUE      Sit to stand assessment:  5x STS: 14.22 sec hands-free   Gait assessments:  10 MWT:  1.01 m/s  L foot scuff/decreased heel strike, absent l arm swing, lack of trunk rotation, slight crouched posture    Functional testing: Doffing shoes (bilateral): 27.5 seconds (must use UE to assist with crossing LLE ), performed seated  Donning shoes (bilateral): 68 seconds, performed seated. Notable decrease in speed with tying shoe laces, pt reports mostly because of LUE  Handwriting assessment: Pt R handed. WNL. Clear, big writing. Pt reports this has improved since medication change.  Coin stacking (6 coins): with RUE 12.5 seconds, LUE 30.5 seconds  Getting in and out of bed: in (using mat table) 30.5 seconds, out of bed 9 seconds. Uses forward approach to mat table>quadruped>roll>supine (significant decreased speed with roll). Pt reports he must use his R leg mostly to assist with bed mobility   Balance Assessments: SLB:  5 seconds LLE, 10 seconds RLE Able to clear obstacle (orange hurdle)   Ascending/descending steps: reciprocal pattern ascend/descend UUE support     PATIENT SURVEYS:  FOTO 81   PATIENT EDUCATION:  Education  details:  HEP, exercise technique, details of LSVT BIG protocol Person educated: Patient Education method: Explanation, Demonstration, Tactile cues, Verbal cues, and Handouts  Education comprehension: verbalized understanding, returned demonstration, verbal cues required, tactile cues required, and needs further education   TODAY'S TREATMENT:                                                                                                                                         DATE: 05/10/2022   LSVT BIG Treatment:  Patient seen for LSVT Daily Session Maximal Daily Exercises for facilitation/coordination of movement Sustained movements are designed to rescale the amplitude of movement output for generalization to daily functional activities.  Maximal Daily Exercises: Floor to Ceiling 10 with 10 second holds.  Side to Side 10 Bilateral with 10 second hold.  Forward Step and Reach 12 Bilateral Sideways Step and Reach 12 Bilateral Backwards Step and Reach 10 Bilateral Forwards Rock and Reach 10 Bilateral Sideways Rock and Reach 10 Bilateral  Comments: Continues to require around 60% cuing, continued focus on increasing amplitude of movement and speed of movement. Pt with mild difficulty with coordination fo #5 and #6.    Functional Component movements:  5 reps of each of the following Sit to stand (required)  Rolling side-lying<>supine each direction SLB LLE (30 sec reps) [place] 6 coins LUE - emphasis on use of finger flicks  Donning shoes - cuing for use of finger flicks prior to tying shoes  Comments: Pt exhibits fewer errors with stacking coins intervention with improved speed of movement.   Hierarchies:  Getting in and out of bed 1x through with additional focus on rolling, big STS from bed,  2. Ambulating over uneven surfaces/obstacles with no AD- incorporated into Gait (big walking) see gait below for full details, continued emphasis on L arm-swing, upright posture, heel-toe pattern  LLE  Gait -  Pt completed three laps (1 lap=148 ft) without AD around 3 cones and over 1 obstacle. Continued multi-modal cuing for heel-strike, arm-swing, upright posture emphasis on L side.  PT provides SBA throughout.  Carryover Assignment: Big posture when walking into work   Homework: Pt to perform all 7 LSVT BIG exercises, 5 Functional Component Tasks, carryover task and big walking.    HOME EXERCISE PROGRAM:  LSVT BIG maximal daily exercises, functional component, hierarchy tasks and carryover task to be initiated next visit  GOALS: Goals reviewed with patient? Yes   SHORT TERM GOALS: Target date: 05/23/2022    Patient will be independent in home exercise program to improve strength/mobility for better functional independence with ADLs. Baseline: to be initiated next visit Goal status: INITIAL   LONG TERM GOALS: Target date: 06/13/2022    Patient will increase FOTO score to equal to or greater than  86   to demonstrate improvement in mobility and quality of life.  Baseline: 81 Goal status: INITIAL  2.  Patient (< 10 years old) will complete five times sit to stand test in < 10 seconds indicating an increased LE strength and improved balance. Baseline:  14.22 sec Goal status: INITIAL  3.  Patient will increase LLE SLB to at least 10 seconds to indicate improved balance/ability to safely clear obstacles and ambulate Baseline: LLE 5 seconds Goal status: INITIAL  4.  Patient will increase 10 meter walk test to >1.1 m/s as to improve gait speed for better community ambulation and to reduce fall risk. Baseline:  1.01 m/s Goal status: INITIAL   6.  Pt will improve ability to don both shoes while seated by at least 10 seconds in order to demonstrate improved functional mobility of BUE and increased ease in dressing. Baseline: 68 seconds Goal status: INITIAL   ASSESSMENT:  CLINICAL IMPRESSION: Pt reports improvement outside of PT with ability to complete movements  for work more easily, indicating functional carryover between sessions. Pt currently requires about 60% cuing for maximal daily exercises. Area of emphasis is improving LUE arm-swing and finger abduction for grasping objects. The pt will benefit from LSVT BIG PT to address and improve these impairments to improve strength, gait, balance and mobility.   OBJECTIVE IMPAIRMENTS: Abnormal gait, decreased balance, decreased coordination, decreased knowledge of use of DME, decreased mobility, difficulty walking, decreased ROM, decreased strength, impaired sensation, impaired UE functional use, improper body mechanics, postural dysfunction, and pain.   ACTIVITY LIMITATIONS: carrying, squatting, stairs, transfers, bed mobility, dressing, and locomotion level  PARTICIPATION LIMITATIONS: meal prep, cleaning, laundry, shopping, community activity, occupation, and yard work  PERSONAL FACTORS: Age, Time since onset of injury/illness/exacerbation, and 1-2 comorbidities: rheumatoid arthritis, HTN, hx of shoulder surgery (date?)  are also affecting patient's functional outcome.   REHAB POTENTIAL: Good  CLINICAL DECISION MAKING: Evolving/moderate complexity  EVALUATION COMPLEXITY: Moderate  PLAN:  PT FREQUENCY: 4x/week  PT DURATION: 6 weeks  PLANNED INTERVENTIONS: Therapeutic exercises, Therapeutic activity, Neuromuscular re-education, Balance training, Gait training, Patient/Family education, Self Care, Joint mobilization, Stair training, Vestibular training, Canalith repositioning, Orthotic/Fit training, DME instructions, Electrical stimulation, Wheelchair mobility training, Spinal mobilization, Cryotherapy, Moist heat, Splintting, Taping, Traction, Manual therapy, and Re-evaluation  PLAN FOR NEXT SESSION: LVST BIG interventions, progress as pt able   Zollie Pee, PT 05/15/2022, 5:16 PM

## 2022-05-16 ENCOUNTER — Ambulatory Visit: Payer: 59

## 2022-05-16 DIAGNOSIS — R278 Other lack of coordination: Secondary | ICD-10-CM

## 2022-05-16 DIAGNOSIS — R2681 Unsteadiness on feet: Secondary | ICD-10-CM

## 2022-05-16 DIAGNOSIS — R262 Difficulty in walking, not elsewhere classified: Secondary | ICD-10-CM

## 2022-05-16 NOTE — Therapy (Unsigned)
OUTPATIENT PHYSICAL THERAPY LSVT TREATMENT NOTE   Patient Name: Renaldo Worthman MRN: [id number] DOB:11/22/1960, 62 y.o., male Today's Date: 05/17/2022  PCP: Juline Patch, MD REFERRING PROVIDER: Vladimir Crofts, MD  END OF SESSION:  PT End of Session - 05/17/22 0942     Visit Number 8    Number of Visits 17    Date for PT Re-Evaluation 06/14/22   additional 2 weeks in case of need to reschedule   PT Start Time 1648    PT Stop Time 1745    PT Time Calculation (min) 57 min    Equipment Utilized During Treatment Gait belt    Activity Tolerance Patient tolerated treatment well    Behavior During Therapy WFL for tasks assessed/performed              Past Medical History:  Diagnosis Date   Allergy    Arthritis    Rheumatoid   GERD (gastroesophageal reflux disease)    Hypertension    Past Surgical History:  Procedure Laterality Date   COLONOSCOPY     COLONOSCOPY WITH PROPOFOL N/A 08/21/2019   Procedure: COLONOSCOPY WITH PROPOFOL;  Surgeon: Lucilla Lame, MD;  Location: [facility];  Service: Endoscopy;  Laterality: N/A;  priority 4   ESOPHAGOGASTRODUODENOSCOPY (EGD) WITH PROPOFOL N/A 08/21/2019   Procedure: ESOPHAGOGASTRODUODENOSCOPY (EGD) WITH PROPOFOL;  Surgeon: Lucilla Lame, MD;  Location: [facility];  Service: Endoscopy;  Laterality: N/A;   POLYPECTOMY  08/21/2019   Procedure: POLYPECTOMY;  Surgeon: Lucilla Lame, MD;  Location: [facility];  Service: Endoscopy;;   SHOULDER SURGERY     TESTICLE SURGERY     age 51 or 33   TONSILLECTOMY     Patient Active Problem List   Diagnosis Date Noted   Encounter for screening colonoscopy    Polyp of transverse colon    Heartburn    Pure hypercholesterolemia 01/26/2016   Right lateral epicondylitis 07/07/2015   Vitamin D deficiency 12/24/2014   Prediabetes 12/24/2014   Allergic rhinitis, seasonal 06/03/2014   Acid reflux 06/03/2014   Essential (primary) hypertension 06/03/2014   Family history of  diabetes mellitus 06/03/2014   Family history of cardiac disorder 06/03/2014   Obesity (BMI 30.0-34.9) 06/03/2014    ONSET DATE: 2 years ago  REFERRING DIAG: G20.C (ICD-10-CM) - Parkinsonism, unspecified   THERAPY DIAG:  Other lack of coordination  Difficulty in walking, not elsewhere classified  Unsteadiness on feet  Rationale for Evaluation and Treatment: Rehabilitation  SUBJECTIVE:  SUBJECTIVE STATEMENT: Pt reports he feels he is benefiting from program. Pt reports increased awareness of his movement, has noticed his L arm shakes when he tries to lift things over head. Pt reports no other updates/concerns.   Pt accompanied by: self  PERTINENT HISTORY:   Pt is a pleasant 62 yo male presenting for LSVT BIG evaluation. He is ambulating without an AD. 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 He initially noticed difficulty with LLE and LUE, with LUE shaking. Pt was taking sinemet but reports this was discontinued after recent doctor appointment due to unwanted symptoms while on it. He has since changed to a new medication that he feels is helping.  He reports no falls within the past year.  He has noticed his gait has slowed down. He reports he has a L side limp. He takes more time to complete ADLs. 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 One of his goals is to improve his ability to work on cars as well as to improve his walking. PMH significant for rheumatoid arthritis, HTN, hx of shoulder surgery (date?)  PAIN:  Are you having pain? Yes: NPRS scale: 3-4/10 Pain location: low back and mid-upper back, bilateral hands (chronic) Pain description:   Aggravating factors:   Relieving factors:     PRECAUTIONS: Fall  WEIGHT BEARING RESTRICTIONS: No  FALLS: Has patient fallen in last 6 months? No  LIVING ENVIRONMENT: Lives with:  Lives in:  Stairs:  Has following equipment at home:   PLOF: Independent  PATIENT GOALS: He would like to improve his mobility of L side to improve his ability to work on cars with students, improve gait   Imaging: via chart MR brain 12/29/21: IMPRESSION: 1. Normal brain MRI. 2. NeuroQuant volumetric analysis of the brain, see details on [employer].    OBJECTIVE:    Assessment:     MMT:  LE strength grossly 4+/5 BLE    Coordination/Cerebellar: Finger to Nose: WFL Heel to Shin: Impaired:  slight decrease in speed LLE Rapid alternating movements: Impaired: LUE   Sensation: Impaired: reports decreased sensation LLE and LUE compared to RLE and RUE      Sit to stand assessment:  5x STS: 14.22 sec hands-free   Gait assessments:  10 MWT:  1.01 m/s  L foot scuff/decreased heel strike, absent l arm swing, lack of trunk rotation, slight crouched posture    Functional testing: Doffing shoes (bilateral): 27.5 seconds (must use UE to assist with crossing LLE ), performed seated  Donning shoes (bilateral): 68 seconds, performed seated. Notable decrease in speed with tying shoe laces, pt reports mostly because of LUE  Handwriting assessment: Pt R handed. WNL. Clear, big writing. Pt reports this has improved since medication change.  Coin stacking (6 coins): with RUE 12.5 seconds, LUE 30.5 seconds  Getting in and out of bed: in (using mat table) 30.5 seconds, out of bed 9 seconds. Uses forward approach to mat table>quadruped>roll>supine (significant decreased speed with roll). Pt reports he must use his R leg mostly to assist with bed mobility   Balance Assessments: SLB:  5 seconds LLE, 10 seconds RLE Able to clear obstacle (orange hurdle)   Ascending/descending steps: reciprocal pattern ascend/descend UUE support     PATIENT SURVEYS:   FOTO 81   PATIENT EDUCATION:  Education details:  HEP, exercise technique, details of LSVT BIG protocol Person educated: Patient Education  method: Explanation, Demonstration, Tactile cues, Verbal cues, and Handouts Education comprehension: verbalized understanding, returned demonstration, verbal cues required, tactile cues required, and needs further education   TODAY'S TREATMENT:                                                                                                                                         DATE: 05/16/22    LSVT BIG Treatment:  Patient seen for LSVT Daily Session Maximal Daily Exercises for facilitation/coordination of movement Sustained movements are designed to rescale the amplitude of movement output for generalization to daily functional activities.  Maximal Daily Exercises: Floor to Ceiling 10 with 10 second holds.  Side to Side 10 Bilateral with 10 second hold.  Forward Step and Reach 12 Bilateral Sideways Step and Reach 12 Bilateral Backwards Step and Reach 12 Bilateral Forwards Rock and Reach 12 Bilateral Sideways Rock and Reach 12 Bilateral  Comments: Continues to require around 60% cuing, continued focus on increasing amplitude of movement and speed of movement. Rates exercises medium. Consistent increased amplitude with L shoulder extension, progresses all MDEs with increased reps    Functional Component movements:  5 reps of each of the following Sit to stand (required)  Rolling side-lying<>supine each direction SLB LLE (30 sec reps) Stack 6 coins LUE - emphasis on use of finger flicks  Donning shoes - cuing for use of finger flicks prior to tying shoes  Comments: Pt with ability to sustain increased amplitude/speed of movements for majority of tasks. Pt does report some difficulty with coin stacking, although this appears error free with pt able to override hypokinetic and bradykinetic movement for majority of task   Hierarchies:  Getting  in and out of bed 3x through with additional focus on rolling, big STS from bed, and focus on big walking to mat table 2. Ambulating over uneven surfaces/obstacles with no AD- incorporated into Gait (big walking) see gait below for full details, continued emphasis on L arm-swing, upright posture, heel-toe pattern LLE  Gait -  Pt completed 7 laps (1 lap=148 ft) without AD around multiple cones and over 1 obstacle, large red mat. Continued multi-modal cuing for heel-strike, arm-swing, upright posture emphasis on L side. 4/7 laps PT holding PVC LUE with PT assisting pt into increased L arm swing. Pt then completed 3/7 laps with focus on ambulating over large red mat and with focusing on L heel strike with gait. Further gait training over 10 meters focus only on navigating around cones as this is difficult for pt.  PT provides SBA throughout.  Carryover Assignment: Big technique to open hand and grab garbage bag, big lift of bag (if not too heavy!)  Homework: Pt to perform all 7 LSVT BIG exercises, 5 Functional Component Tasks, carryover task and big walking.    HOME EXERCISE PROGRAM:  LSVT BIG maximal daily exercises, functional component, hierarchy tasks and carryover task to be  initiated next visit  GOALS: Goals reviewed with patient? Yes   SHORT TERM GOALS: Target date: 05/23/2022    Patient will be independent in home exercise program to improve strength/mobility for better functional independence with ADLs. Baseline: to be initiated next visit Goal status: INITIAL   LONG TERM GOALS: Target date: 06/13/2022    Patient will increase FOTO score to equal to or greater than  86   to demonstrate improvement in mobility and quality of life.  Baseline: 81 Goal status: INITIAL  2.  Patient (< 41 years old) will complete five times sit to stand test in < 10 seconds indicating an increased LE strength and improved balance. Baseline: 14.22 sec Goal status: INITIAL  3.  Patient will  increase LLE SLB to at least 10 seconds to indicate improved balance/ability to safely clear obstacles and ambulate Baseline: LLE 5 seconds Goal status: INITIAL  4.  Patient will increase 10 meter walk test to >1.1 m/s as to improve gait speed for better community ambulation and to reduce fall risk. Baseline:  1.01 m/s Goal status: INITIAL   6.  Pt will improve ability to don both shoes while seated by at least 10 seconds in order to demonstrate improved functional mobility of BUE and increased ease in dressing. Baseline: 68 seconds Goal status: INITIAL   ASSESSMENT:  CLINICAL IMPRESSION: Pt able to further progress intensity of interventions with performing increased reps. He shows good ability to sustain increased amplitude of L arm swing, particularly going into shoulder extension with maximal daily exercises. This indicates functional carryover between sessions. The pt will benefit from LSVT BIG PT to address and improve these impairments to improve strength, gait, balance and mobility.   OBJECTIVE IMPAIRMENTS: Abnormal gait, decreased balance, decreased coordination, decreased knowledge of use of DME, decreased mobility, difficulty walking, decreased ROM, decreased strength, impaired sensation, impaired UE functional use, improper body mechanics, postural dysfunction, and pain.   ACTIVITY LIMITATIONS: carrying, squatting, stairs, transfers, bed mobility, dressing, and locomotion level  PARTICIPATION LIMITATIONS: meal prep, cleaning, laundry, shopping, community activity, occupation, and yard work  PERSONAL FACTORS: Age, Time since onset of injury/illness/exacerbation, and 1-2 comorbidities: rheumatoid arthritis, HTN, hx of shoulder surgery (date?)  are also affecting patient's functional outcome.   REHAB POTENTIAL: Good  CLINICAL DECISION MAKING: Evolving/moderate complexity  EVALUATION COMPLEXITY: Moderate  PLAN:  PT FREQUENCY: 4x/week  PT DURATION: 6 weeks  PLANNED  INTERVENTIONS: Therapeutic exercises, Therapeutic activity, Neuromuscular re-education, Balance training, Gait training, Patient/Family education, Self Care, Joint mobilization, Stair training, Vestibular training, Canalith repositioning, Orthotic/Fit training, DME instructions, Electrical stimulation, Wheelchair mobility training, Spinal mobilization, Cryotherapy, Moist heat, Splintting, Taping, Traction, Manual therapy, and Re-evaluation  PLAN FOR NEXT SESSION: LVST BIG interventions, progress as pt able   Zollie Pee, PT 05/17/2022, 9:54 AM

## 2022-05-17 ENCOUNTER — Ambulatory Visit: Payer: 59

## 2022-05-17 DIAGNOSIS — R2681 Unsteadiness on feet: Secondary | ICD-10-CM

## 2022-05-17 DIAGNOSIS — R262 Difficulty in walking, not elsewhere classified: Secondary | ICD-10-CM

## 2022-05-17 DIAGNOSIS — R278 Other lack of coordination: Secondary | ICD-10-CM

## 2022-05-17 NOTE — Therapy (Signed)
OUTPATIENT PHYSICAL THERAPY LSVT TREATMENT NOTE   Patient Name: Tyler Glover MRN: [id number] DOB:Sep 25, 1960, 62 y.o., male Today's Date: 05/17/2022  PCP: Juline Patch, MD REFERRING PROVIDER: Vladimir Crofts, MD  END OF SESSION:  PT End of Session - 05/17/22 1741     Visit Number 9    Number of Visits 17    Date for PT Re-Evaluation 06/14/22   additional 2 weeks in case of need to reschedule   PT Start Time 1625    PT Stop Time 1728    PT Time Calculation (min) 63 min    Equipment Utilized During Treatment Gait belt    Activity Tolerance Patient tolerated treatment well    Behavior During Therapy WFL for tasks assessed/performed              Past Medical History:  Diagnosis Date   Allergy    Arthritis    Rheumatoid   GERD (gastroesophageal reflux disease)    Hypertension    Past Surgical History:  Procedure Laterality Date   COLONOSCOPY     COLONOSCOPY WITH PROPOFOL N/A 08/21/2019   Procedure: COLONOSCOPY WITH PROPOFOL;  Surgeon: Lucilla Lame, MD;  Location: [place];  Service: Endoscopy;  Laterality: N/A;  priority 4   ESOPHAGOGASTRODUODENOSCOPY (EGD) WITH PROPOFOL N/A 08/21/2019   Procedure: ESOPHAGOGASTRODUODENOSCOPY (EGD) WITH PROPOFOL;  Surgeon: Lucilla Lame, MD;  Location: [place];  Service: Endoscopy;  Laterality: N/A;   POLYPECTOMY  08/21/2019   Procedure: POLYPECTOMY;  Surgeon: Lucilla Lame, MD;  Location: [place];  Service: Endoscopy;;   SHOULDER SURGERY     TESTICLE SURGERY     age 49 or 34   TONSILLECTOMY     Patient Active Problem List   Diagnosis Date Noted   Encounter for screening colonoscopy    Polyp of transverse colon    Heartburn    Pure hypercholesterolemia 01/26/2016   Right lateral epicondylitis 07/07/2015   Vitamin D deficiency 12/24/2014   Prediabetes 12/24/2014   Allergic rhinitis, seasonal 06/03/2014   Acid reflux 06/03/2014   Essential (primary) hypertension 06/03/2014   Family history of  diabetes mellitus 06/03/2014   Family history of cardiac disorder 06/03/2014   Obesity (BMI 30.0-34.9) 06/03/2014    ONSET DATE: 2 years ago  REFERRING DIAG: G20.C (ICD-10-CM) - Parkinsonism, unspecified   THERAPY DIAG:  Difficulty in walking, not elsewhere classified  Other lack of coordination  Unsteadiness on feet  Rationale for Evaluation and Treatment: Rehabilitation  SUBJECTIVE:  SUBJECTIVE STATEMENT: Pt feels his walking has improved. No other updates.   Pt accompanied by: self  PERTINENT HISTORY:   Pt is a pleasant 62 yo male presenting for LSVT BIG evaluation. He is ambulating without an AD. 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 He initially noticed difficulty with LLE and LUE, with LUE shaking. Pt was taking sinemet but reports this was discontinued after recent doctor appointment due to unwanted symptoms while on it. He has since changed to a new medication that he feels is helping.  He reports no falls within the past year.  He has noticed his gait has slowed down. He reports he has a L side limp. He takes more time to complete ADLs. 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 One of his goals is to improve his ability to work on cars as well as to improve his walking. PMH significant for rheumatoid arthritis, HTN, hx of shoulder surgery (date?)  PAIN:  Are you having pain? Yes: NPRS scale: 3-4/10 Pain location: low back and mid-upper back, bilateral hands (chronic) Pain description:   Aggravating factors:   Relieving factors:    PRECAUTIONS: Fall  WEIGHT BEARING RESTRICTIONS: No  FALLS: Has patient fallen in last 6 months? No  LIVING ENVIRONMENT: Lives with:  Lives  in:  Stairs:  Has following equipment at home:   PLOF: Independent  PATIENT GOALS: He would like to improve his mobility of L side to improve his ability to work on cars with students, improve gait   Imaging: via chart MR brain 12/29/21: IMPRESSION: 1. Normal brain MRI. 2. NeuroQuant volumetric analysis of the brain, see details on [employer].    OBJECTIVE:    Assessment:     MMT:  LE strength grossly 4+/5 BLE    Coordination/Cerebellar: Finger to Nose: WFL Heel to Shin: Impaired:  slight decrease in speed LLE Rapid alternating movements: Impaired: LUE   Sensation: Impaired: reports decreased sensation LLE and LUE compared to RLE and RUE      Sit to stand assessment:  5x STS: 14.22 sec hands-free   Gait assessments:  10 MWT:  1.01 m/s  L foot scuff/decreased heel strike, absent l arm swing, lack of trunk rotation, slight crouched posture    Functional testing: Doffing shoes (bilateral): 27.5 seconds (must use UE to assist with crossing LLE ), performed seated  Donning shoes (bilateral): 68 seconds, performed seated. Notable decrease in speed with tying shoe laces, pt reports mostly because of LUE  Handwriting assessment: Pt R handed. WNL. Clear, big writing. Pt reports this has improved since medication change.  Coin stacking (6 coins): with RUE 12.5 seconds, LUE 30.5 seconds  Getting in and out of bed: in (using mat table) 30.5 seconds, out of bed 9 seconds. Uses forward approach to mat table>quadruped>roll>supine (significant decreased speed with roll). Pt reports he must use his R leg mostly to assist with bed mobility   Balance Assessments: SLB:  5 seconds LLE, 10 seconds RLE Able to clear obstacle (orange hurdle)   Ascending/descending steps: reciprocal pattern ascend/descend UUE support     PATIENT SURVEYS:  FOTO 81   PATIENT EDUCATION:  Education details:  HEP, exercise technique, details of LSVT BIG protocol Person educated: Patient Education  method: Explanation, Demonstration, Tactile cues, Verbal cues, and Handouts Education comprehension: verbalized understanding, returned demonstration, verbal cues required, tactile cues required, and needs further education  TODAY'S TREATMENT:                                                                                                                                         DATE: 05/17/22     LSVT BIG Treatment:  Patient seen for LSVT Daily Session Maximal Daily Exercises for facilitation/coordination of movement Sustained movements are designed to rescale the amplitude of movement output for generalization to daily functional activities.  Maximal Daily Exercises: Floor to Ceiling 10 with 10 second holds.  Side to Side 10 Bilateral with 10 second hold.  Forward Step and Reach 12 Bilateral Sideways Step and Reach 12 Bilateral Backwards Step and Reach 12 Bilateral Forwards Rock and Reach 12 Bilateral Sideways Rock and Reach 12 Bilateral  Comments: Continues to require around 50-60% cuing, continued focus on increasing amplitude of movement and speed of movement.     Functional Component movements:  5 reps of each of the following Sit to stand (required)  Rolling side-lying<>supine each direction - incorporated in to bed mobility SLB LLE (30 sec reps) Stack 6 coins LUE - emphasis on use of finger flicks  Donning shoes - cuing for use of finger flicks prior to tying shoes  Comments: Pt exhibits general improvement with all functional component tasks, is 90% indep with technique. Pt still struggles somewhat with tying shoe-laces, exhibiting bradykinetic movement. Continued cuing for increased effort and finger flicks.   Hierarchies:  Getting in and out of bed 5x through with additional focus on rolling, big STS from bed, and focus on big walking to mat table 2. Ambulating over uneven surfaces/obstacles with no AD- incorporated into Gait (big walking) see gait below for full details,  continued emphasis on L arm-swing, upright posture, heel-toe pattern LLE. Pt completed around  1332 ft total. Improvement in ability to sustain L arm swing.  Gait -  Pt completed 9 laps total (1 lap=148 ft) without AD. 3 laps completed with pt ambulating through obstacle course of multiple cones, 2 shoe boxes, one airex pad, and large red mat. 3 laps completed with pt holding PVC BUE in order for PT to assist with arm swing 3 laps completed with focus on upright posture, heel-strike of LLE Pt additionally completed multiple reps ambulating BCKWD/FWD/LTL and in circles on large red mat for compliant surface challenge.  PT provides SBA throughout. Pt with improved ability to navigate obstacles today, does not knock over cones. Somewhat challenged with sustaining upright posture.   Carryover Assignment: Big STS from couch, BIG posture walking into home, BIG finger flicks to type on keyboard, Big wave with L hand/open hand   Homework: Pt to perform all 7 LSVT BIG exercises, 5 Functional Component Tasks, carryover task and big walking.    HOME EXERCISE PROGRAM:  LSVT BIG maximal daily exercises, functional component, hierarchy tasks and carryover task to be initiated next visit  GOALS: Goals reviewed with patient?  Yes   SHORT TERM GOALS: Target date: 05/23/2022    Patient will be independent in home exercise program to improve strength/mobility for better functional independence with ADLs. Baseline: to be initiated next visit Goal status: INITIAL   LONG TERM GOALS: Target date: 06/13/2022    Patient will increase FOTO score to equal to or greater than  86   to demonstrate improvement in mobility and quality of life.  Baseline: 81 Goal status: INITIAL  2.  Patient (< 7 years old) will complete five times sit to stand test in < 10 seconds indicating an increased LE strength and improved balance. Baseline: 14.22 sec Goal status: INITIAL  3.  Patient will increase LLE SLB to at  least 10 seconds to indicate improved balance/ability to safely clear obstacles and ambulate Baseline: LLE 5 seconds Goal status: INITIAL  4.  Patient will increase 10 meter walk test to >1.1 m/s as to improve gait speed for better community ambulation and to reduce fall risk. Baseline:  1.01 m/s Goal status: INITIAL   6.  Pt will improve ability to don both shoes while seated by at least 10 seconds in order to demonstrate improved functional mobility of BUE and increased ease in dressing. Baseline: 68 seconds Goal status: INITIAL   ASSESSMENT:  CLINICAL IMPRESSION: Pt shows carryover between sessions with increased independence with technique and requiring less cuing throughout. He also continues to improve ability to sustain left arm swing into L shoulder extension with interventions, pt also with improved ability to navigate around obstacles today. Areas of continued difficulty include tying shoe-laces, heel strike with LLE, and sustaining upright posture. The pt will benefit from LSVT BIG PT to address and improve these impairments to improve strength, gait, balance and mobility.   OBJECTIVE IMPAIRMENTS: Abnormal gait, decreased balance, decreased coordination, decreased knowledge of use of DME, decreased mobility, difficulty walking, decreased ROM, decreased strength, impaired sensation, impaired UE functional use, improper body mechanics, postural dysfunction, and pain.   ACTIVITY LIMITATIONS: carrying, squatting, stairs, transfers, bed mobility, dressing, and locomotion level  PARTICIPATION LIMITATIONS: meal prep, cleaning, laundry, shopping, community activity, occupation, and yard work  PERSONAL FACTORS: Age, Time since onset of injury/illness/exacerbation, and 1-2 comorbidities: rheumatoid arthritis, HTN, hx of shoulder surgery (date?)  are also affecting patient's functional outcome.   REHAB POTENTIAL: Good  CLINICAL DECISION MAKING: Evolving/moderate complexity  EVALUATION  COMPLEXITY: Moderate  PLAN:  PT FREQUENCY: 4x/week  PT DURATION: 6 weeks  PLANNED INTERVENTIONS: Therapeutic exercises, Therapeutic activity, Neuromuscular re-education, Balance training, Gait training, Patient/Family education, Self Care, Joint mobilization, Stair training, Vestibular training, Canalith repositioning, Orthotic/Fit training, DME instructions, Electrical stimulation, Wheelchair mobility training, Spinal mobilization, Cryotherapy, Moist heat, Splintting, Taping, Traction, Manual therapy, and Re-evaluation  PLAN FOR NEXT SESSION: LVST BIG interventions, progress as pt able   Zollie Pee, PT 05/17/2022, 5:42 PM

## 2022-05-21 ENCOUNTER — Ambulatory Visit: Payer: 59

## 2022-05-22 ENCOUNTER — Ambulatory Visit: Payer: 59

## 2022-05-23 ENCOUNTER — Ambulatory Visit: Payer: 59

## 2022-05-23 DIAGNOSIS — R2681 Unsteadiness on feet: Secondary | ICD-10-CM

## 2022-05-23 DIAGNOSIS — R278 Other lack of coordination: Secondary | ICD-10-CM | POA: Diagnosis not present

## 2022-05-23 DIAGNOSIS — R262 Difficulty in walking, not elsewhere classified: Secondary | ICD-10-CM

## 2022-05-23 NOTE — Therapy (Signed)
OUTPATIENT PHYSICAL THERAPY LSVT TREATMENT NOTE/Physical Therapy Progress Note   Dates of reporting period  05/02/2022   to   4/101/2024    Patient Name: Tyler Glover MRN: [id number] DOB:May 14, 1960, 62 y.o., male Today's Date: 05/23/2022  PCP: Duanne Limerick, MD REFERRING PROVIDER: Lonell Face, MD  END OF SESSION:  PT End of Session - 05/23/22 1748     Visit Number 10    Number of Visits 17    Date for PT Re-Evaluation 06/14/22   additional 2 weeks in case of need to reschedule   PT Start Time 1648    PT Stop Time 1744    PT Time Calculation (min) 56 min    Equipment Utilized During Treatment Gait belt    Activity Tolerance Patient tolerated treatment well    Behavior During Therapy WFL for tasks assessed/performed               Past Medical History:  Diagnosis Date   Allergy    Arthritis    Rheumatoid   GERD (gastroesophageal reflux disease)    Hypertension    Past Surgical History:  Procedure Laterality Date   COLONOSCOPY     COLONOSCOPY WITH PROPOFOL N/A 08/21/2019   Procedure: COLONOSCOPY WITH PROPOFOL;  Surgeon: Midge Minium, MD;  Location: [facility];  Service: Endoscopy;  Laterality: N/A;  priority 4   ESOPHAGOGASTRODUODENOSCOPY (EGD) WITH PROPOFOL N/A 08/21/2019   Procedure: ESOPHAGOGASTRODUODENOSCOPY (EGD) WITH PROPOFOL;  Surgeon: Midge Minium, MD;  Location: [facility];  Service: Endoscopy;  Laterality: N/A;   POLYPECTOMY  08/21/2019   Procedure: POLYPECTOMY;  Surgeon: Midge Minium, MD;  Location: [facility];  Service: Endoscopy;;   SHOULDER SURGERY     TESTICLE SURGERY     age 22 or 30   TONSILLECTOMY     Patient Active Problem List   Diagnosis Date Noted   Encounter for screening colonoscopy    Polyp of transverse colon    Heartburn    Pure hypercholesterolemia 01/26/2016   Right lateral epicondylitis 07/07/2015   Vitamin D deficiency 12/24/2014   Prediabetes 12/24/2014   Allergic rhinitis, seasonal 06/03/2014    Acid reflux 06/03/2014   Essential (primary) hypertension 06/03/2014   Family history of diabetes mellitus 06/03/2014   Family history of cardiac disorder 06/03/2014   Obesity (BMI 30.0-34.9) 06/03/2014    ONSET DATE: 2 years ago  REFERRING DIAG: G20.C (ICD-10-CM) - Parkinsonism, unspecified   THERAPY DIAG:  Other lack of coordination  Difficulty in walking, not elsewhere classified  Unsteadiness on feet  Rationale for Evaluation and Treatment: Rehabilitation  SUBJECTIVE:  SUBJECTIVE STATEMENT: Pt reports he has not been good about completing his HEP due to feeling unwell/allergies. He reports no falls.   Pt accompanied by: self  PERTINENT HISTORY:   Pt is a pleasant 62 yo male presenting for LSVT BIG evaluation. He is ambulating without an AD. 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 He initially noticed difficulty with LLE and LUE, with LUE shaking. Pt was taking sinemet but reports this was discontinued after recent doctor appointment due to unwanted symptoms while on it. He has since changed to a new medication that he feels is helping.  He reports no falls within the past year.  He has noticed his gait has slowed down. He reports he has a L side limp. He takes more time to complete ADLs. 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 One of his goals is to improve his ability to work on cars as well as to improve his walking. PMH significant for rheumatoid arthritis, HTN, hx of shoulder surgery (date?)  PAIN:  Are you having pain? Yes: NPRS scale: 3-4/10 Pain location: low back and mid-upper back, bilateral hands (chronic) Pain description:   Aggravating factors:   Relieving factors:     PRECAUTIONS: Fall  WEIGHT BEARING RESTRICTIONS: No  FALLS: Has patient fallen in last 6 months? No  LIVING ENVIRONMENT: Lives with:  Lives in:  Stairs:  Has following equipment at home:   PLOF: Independent  PATIENT GOALS: He would like to improve his mobility of L side to improve his ability to work on cars with students, improve gait   Imaging: via chart MR brain 12/29/21: IMPRESSION: 1. Normal brain MRI. 2. NeuroQuant volumetric analysis of the brain, see details on [employer].    OBJECTIVE:    Assessment:     MMT:  LE strength grossly 4+/5 BLE    Coordination/Cerebellar: Finger to Nose: WFL Heel to Shin: Impaired:  slight decrease in speed LLE Rapid alternating movements: Impaired: LUE   Sensation: Impaired: reports decreased sensation LLE and LUE compared to RLE and RUE      Sit to stand assessment:  5x STS: 14.22 sec hands-free   Gait assessments:  10 MWT:  1.01 m/s  L foot scuff/decreased heel strike, absent l arm swing, lack of trunk rotation, slight crouched posture    Functional testing: Doffing shoes (bilateral): 27.5 seconds (must use UE to assist with crossing LLE ), performed seated  Donning shoes (bilateral): 68 seconds, performed seated. Notable decrease in speed with tying shoe laces, pt reports mostly because of LUE  Handwriting assessment: Pt R handed. WNL. Clear, big writing. Pt reports this has improved since medication change.  Coin stacking (6 coins): with RUE 12.5 seconds, LUE 30.5 seconds  Getting in and out of bed: in (using mat table) 30.5 seconds, out of bed 9 seconds. Uses forward approach to mat table>quadruped>roll>supine (significant decreased speed with roll). Pt reports he must use his R leg mostly to assist with bed mobility   Balance Assessments: SLB:  5 seconds LLE, 10 seconds RLE Able to clear obstacle (orange hurdle)   Ascending/descending steps: reciprocal pattern ascend/descend UUE support     PATIENT SURVEYS:   FOTO 81   PATIENT EDUCATION:  Education details:  HEP, exercise technique, details of LSVT BIG protocol Person educated: Patient Education method: Explanation, Demonstration, Tactile cues, Verbal cues, and Handouts Education comprehension: verbalized understanding, returned demonstration, verbal  cues required, tactile cues required, and needs further education   TODAY'S TREATMENT:                                                                                                                                         DATE: 05/23/22  Goal reassessment completed for progress note. Testing incorporated into LSVT protocol. See goal section below for details.   LSVT BIG Treatment:  Patient seen for LSVT Daily Session Maximal Daily Exercises for facilitation/coordination of movement Sustained movements are designed to rescale the amplitude of movement output for generalization to daily functional activities.  Maximal Daily Exercises: Floor to Ceiling 12 with 10 second holds.  Side to Side 12 Bilateral with 10 second hold Forward Step and Reach 12 Bilateral Sideways Step and Reach 12 Bilateral Backwards Step and Reach 12 Bilateral Forwards Rock and Reach 12 Bilateral Sideways Rock and Reach 12 Bilateral  Comments: Pt requires around 40% cuing, continued focus on increasing amplitude of movement and speed of movement. Focus on improving upright posture with exercises    Functional Component movements:  5 reps of each of the following Sit to stand (required)  Rolling side-lying<>supine each direction - incorporated in to bed mobility SLB LLE (30 sec reps) Stack 6 coins LUE - emphasis on use of finger flicks  Donning shoes - cuing for use of finger flicks prior to tying shoes  Comments: Pt continues to improve speed of movement and SLB AEB goal reassessment (see goal section below)   Hierarchies:  Getting in and out of bed 2x through with additional focus on rolling, big STS from bed,  and focus on big walking to mat table 2. Ambulating over uneven surfaces/obstacles with no AD- incorporated into Gait (big walking) see gait below for full details, continued emphasis on L arm-swing, upright posture, heel-toe pattern LLE. Added dual cognitive task for last set, which was challenging for pt, had difficulty sustaining BIG technique. Pt completed approx 1400 ft  Gait -  Pt completed > 9 laps total (1 lap=148 ft) without AD, addition of pt walking over two airex pads, and walking around/between multiple cones. Continued focus on heel-toe pattern, upright posture, left arm swing, and addition of dual cognitive task. Pt continues to require frequent hands-on cues to sustain L arm swing, particularly with addition of dual cog task.  PT provides SBA throughout.  Carryover Assignment: 5 BIG left arm-swings  Homework: Pt to perform all 7 LSVT BIG exercises, 5 Functional Component Tasks, carryover task and big walking.    HOME EXERCISE PROGRAM:  LSVT BIG maximal daily exercises, functional component, hierarchy tasks and carryover task to be initiated next visit  GOALS: Goals reviewed with patient? Yes   SHORT TERM GOALS: Target date: 05/23/2022    Patient will be independent in home exercise program to improve strength/mobility for better functional independence with ADLs. Baseline: to be initiated next visit; 4/10:  pt not yet fully indep with HEP Goal status: IN PROGRESS   LONG TERM GOALS: Target date: 06/13/2022    Patient will increase FOTO score to equal to or greater than  86   to demonstrate improvement in mobility and quality of life.  Baseline: 81; 4/10: to be reassessed future visit Goal status: IN PROGRESS  2.  Patient (< 60 years old) will complete five times sit to stand test in < 10 seconds indicating an increased LE strength and improved balance. Baseline: 14.22 sec; 4/10: 11.13 sec hands-free Goal status: PARTIALLY MET  3.  Patient will increase LLE SLB to  at least 10 seconds to indicate improved balance/ability to safely clear obstacles and ambulate Baseline: LLE 5 seconds; 4/10: 30 seconds Goal status: MET  4.  Patient will increase 10 meter walk test to >1.1 m/s as to improve gait speed for better community ambulation and to reduce fall risk. Baseline:  1.01 m/s; 4/10: 1.5 m/s Goal status: MET   6.  Pt will improve ability to don both shoes while seated by at least 10 seconds in order to demonstrate improved functional mobility of BUE and increased ease in dressing. Baseline: 68 seconds; 4/10 35.5 seconds  Goal status: MET   ASSESSMENT:  CLINICAL IMPRESSION: Goals reassessed for progress note. Pt has met 3/7 goals, and partially met a 4th goal. These improvements indicate decreased fall risk, improved BLE strength/power, improved functional mobility required to don shoes, and improves SLB and gait. Patient's condition has the potential to improve in response to therapy. Maximum improvement is yet to be obtained. The anticipated improvement is attainable and reasonable in a generally predictable time.   The pt will benefit from LSVT BIG PT to address and improve these impairments to improve strength, gait, balance and mobility.   OBJECTIVE IMPAIRMENTS: Abnormal gait, decreased balance, decreased coordination, decreased knowledge of use of DME, decreased mobility, difficulty walking, decreased ROM, decreased strength, impaired sensation, impaired UE functional use, improper body mechanics, postural dysfunction, and pain.   ACTIVITY LIMITATIONS: carrying, squatting, stairs, transfers, bed mobility, dressing, and locomotion level  PARTICIPATION LIMITATIONS: meal prep, cleaning, laundry, shopping, community activity, occupation, and yard work  PERSONAL FACTORS: Age, Time since onset of injury/illness/exacerbation, and 1-2 comorbidities: rheumatoid arthritis, HTN, hx of shoulder surgery (date?)  are also affecting patient's functional outcome.    REHAB POTENTIAL: Good  CLINICAL DECISION MAKING: Evolving/moderate complexity  EVALUATION COMPLEXITY: Moderate  PLAN:  PT FREQUENCY: 4x/week  PT DURATION: 6 weeks  PLANNED INTERVENTIONS: Therapeutic exercises, Therapeutic activity, Neuromuscular re-education, Balance training, Gait training, Patient/Family education, Self Care, Joint mobilization, Stair training, Vestibular training, Canalith repositioning, Orthotic/Fit training, DME instructions, Electrical stimulation, Wheelchair mobility training, Spinal mobilization, Cryotherapy, Moist heat, Splintting, Taping, Traction, Manual therapy, and Re-evaluation  PLAN FOR NEXT SESSION: LVST BIG interventions, progress as pt able   Baird KayHaley R Jeramia Saleeby, PT 05/23/2022, 5:56 PM

## 2022-05-24 ENCOUNTER — Ambulatory Visit: Payer: 59

## 2022-05-24 DIAGNOSIS — R2681 Unsteadiness on feet: Secondary | ICD-10-CM

## 2022-05-24 DIAGNOSIS — R278 Other lack of coordination: Secondary | ICD-10-CM

## 2022-05-24 DIAGNOSIS — R262 Difficulty in walking, not elsewhere classified: Secondary | ICD-10-CM

## 2022-05-24 NOTE — Therapy (Signed)
OUTPATIENT PHYSICAL THERAPY LSVT TREATMENT NOTE    Patient Name: Tyler Glover MRN: [id number] DOB:[birth year], 62 y.o., male Today's Date: 05/24/2022  PCP: Duanne LimerickJones, Deanna C, MD REFERRING PROVIDER: Lonell FaceShah, Hemang K, MD  END OF SESSION:  PT End of Session - 05/24/22 1642     Visit Number 11    Number of Visits 17    Date for PT Re-Evaluation 06/14/22   additional 2 weeks in case of need to reschedule   PT Start Time 1642    PT Stop Time 1733    PT Time Calculation (min) 51 min    Equipment Utilized During Treatment Gait belt    Activity Tolerance Patient tolerated treatment well    Behavior During Therapy WFL for tasks assessed/performed               Past Medical History:  Diagnosis Date   Allergy    Arthritis    Rheumatoid   GERD (gastroesophageal reflux disease)    Hypertension    Past Surgical History:  Procedure Laterality Date   COLONOSCOPY     COLONOSCOPY WITH PROPOFOL N/A 08/21/2019   Procedure: COLONOSCOPY WITH PROPOFOL;  Surgeon: Midge MiniumWohl, Darren, MD;  [facility];  Service: Endoscopy;  Laterality: N/A;  priority 4   ESOPHAGOGASTRODUODENOSCOPY (EGD) WITH PROPOFOL N/A 08/21/2019   Procedure: ESOPHAGOGASTRODUODENOSCOPY (EGD) WITH PROPOFOL;  Surgeon: Midge MiniumWohl, Darren, MD;  [facility];  Service: Endoscopy;  Laterality: N/A;   POLYPECTOMY  08/21/2019   Procedure: POLYPECTOMY;  Surgeon: Midge MiniumWohl, Darren, MD;  [facility];  Service: Endoscopy;;   SHOULDER SURGERY     TESTICLE SURGERY     age 62 or [age]   TONSILLECTOMY     Patient Active Problem List   Diagnosis Date Noted   Encounter for screening colonoscopy    Polyp of transverse colon    Heartburn    Pure hypercholesterolemia 01/26/2016   Right lateral epicondylitis 07/07/2015   Vitamin D deficiency 12/24/2014   Prediabetes 12/24/2014   Allergic rhinitis, seasonal 06/03/2014   Acid reflux 06/03/2014   Essential (primary) hypertension 06/03/2014   Family history  of diabetes mellitus 06/03/2014   Family history of cardiac disorder 06/03/2014   Obesity (BMI 30.0-34.9) 06/03/2014    ONSET DATE: 2 years ago  REFERRING DIAG: G20.C (ICD-10-CM) - Parkinsonism, unspecified   THERAPY DIAG:  Other lack of coordination  Unsteadiness on feet  Difficulty in walking, not elsewhere classified  Rationale for Evaluation and Treatment: Rehabilitation  SUBJECTIVE:  SUBJECTIVE STATEMENT: Pt reports he has noticed an improvement in walking, and is faster moving around.   Pt accompanied by: self  PERTINENT HISTORY:   Pt is a pleasant 62 yo male presenting for LSVT BIG evaluation. He is ambulating without an AD. 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 He initially noticed difficulty with LLE and LUE, with LUE shaking. Pt was taking sinemet but reports this was discontinued after recent doctor appointment due to unwanted symptoms while on it. He has since changed to a new medication that he feels is helping.  He reports no falls within the past year.  He has noticed his gait has slowed down. He reports he has a L side limp. He takes more time to complete ADLs. 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 One of his goals is to improve his ability to work on cars as well as to improve his walking. PMH significant for rheumatoid arthritis, HTN, hx of shoulder surgery (date?)  PAIN:  Are you having pain? Yes: NPRS scale: 3-4/10 Pain location: low back and mid-upper back, bilateral hands (chronic) Pain description:   Aggravating factors:   Relieving factors:    PRECAUTIONS: Fall  WEIGHT BEARING RESTRICTIONS: No  FALLS: Has patient fallen in last 6 months? No  LIVING  ENVIRONMENT: Lives with:  Lives in:  Stairs:  Has following equipment at home:   PLOF: Independent  PATIENT GOALS: He would like to improve his mobility of L side to improve his ability to work on cars with students, improve gait   Imaging: via chart MR brain 12/29/21: IMPRESSION: 1. Normal brain MRI. 2. NeuroQuant volumetric analysis of the brain, see details on [employer].    OBJECTIVE:    Assessment:     MMT:  LE strength grossly 4+/5 BLE    Coordination/Cerebellar: Finger to Nose: WFL Heel to Shin: Impaired:  slight decrease in speed LLE Rapid alternating movements: Impaired: LUE   Sensation: Impaired: reports decreased sensation LLE and LUE compared to RLE and RUE      Sit to stand assessment:  5x STS: 14.22 sec hands-free   Gait assessments:  10 MWT:  1.01 m/s  L foot scuff/decreased heel strike, absent l arm swing, lack of trunk rotation, slight crouched posture    Functional testing: Doffing shoes (bilateral): 27.5 seconds (must use UE to assist with crossing LLE ), performed seated  Donning shoes (bilateral): 68 seconds, performed seated. Notable decrease in speed with tying shoe laces, pt reports mostly because of LUE  Handwriting assessment: Pt R handed. WNL. Clear, big writing. Pt reports this has improved since medication change.  Coin stacking (6 coins): with RUE 12.5 seconds, LUE 30.5 seconds  Getting in and out of bed: in (using mat table) 30.5 seconds, out of bed 9 seconds. Uses forward approach to mat table>quadruped>roll>supine (significant decreased speed with roll). Pt reports he must use his R leg mostly to assist with bed mobility   Balance Assessments: SLB:  5 seconds LLE, 10 seconds RLE Able to clear obstacle (orange hurdle)   Ascending/descending steps: reciprocal pattern ascend/descend UUE support     PATIENT SURVEYS:  FOTO 81   PATIENT EDUCATION:  Education details:  HEP, exercise technique, details of LSVT BIG  protocol Person educated: Patient Education method: Explanation, Demonstration, Tactile cues, Verbal cues, and Handouts Education comprehension: verbalized understanding, returned demonstration, verbal cues required, tactile cues required,  and needs further education   TODAY'S TREATMENT:                                                                                                                                         DATE: 05/24/22   LSVT BIG Treatment:  Patient seen for LSVT Daily Session Maximal Daily Exercises for facilitation/coordination of movement Sustained movements are designed to rescale the amplitude of movement output for generalization to daily functional activities.  Maximal Daily Exercises: Floor to Ceiling 12 with 10 second holds Side to Side 12 Bilateral with 10 second hold Forward Step and Reach 15 Bilateral Sideways Step and Reach 15 Bilateral Backwards Step and Reach 12 Bilateral Forwards Rock and Reach 12 Bilateral Sideways Rock and Reach 12 Bilateral  Comments: Pt continues to require around 40% cuing, continued focus on increasing amplitude of movement and speed of movement. Focus on improving upright posture with exercises and abducting fingers of L hand    Functional Component movements:  5 reps of each of the following Sit to stand (required)  Rolling side-lying<>supine each direction - incorporated in to bed mobility SLB LLE (30 sec reps) Stack 6 coins LUE - emphasis on use of finger flicks  Donning shoes - cuing for use of finger flicks prior to tying shoes  Comments: Pt indep with STS technique, provided cuing for slight increase in L finger abduction. Continues to exhibit increased speed with rolling, STS, donning/doffing shoes, and coin stacking.    Hierarchies:  Getting in and out of bed 2x through with additional focus on rolling, big STS from bed, and focus on big walking to mat table with addition of dual cog task today 2. Ambulating over  uneven surfaces/obstacles with no AD- incorporated into Gait (big walking) see gait below for full details, continued emphasis on L arm-swing, upright posture, heel-toe pattern LLE with addition of dual cog task throughout. PT provided SBA, continued cuing throughout  Gait -  Pt completed 10 aps total (1 lap=148 ft) without AD, with pt ambulating over one airex pad and two obstacles, weaving through multiple cones with and without dual cog task throughout. Continued focus on heel-toe pattern, upright posture, left arm swing. Pt continues to be challenged with sustaining technique with dual cog task.  PT provides SBA throughout.  Carryover Assignment(s): Big roll out of bed with big walk into kitchen, big lift of box followed by big steps, big wave to your spouse when you get home, big effort for arm movements when polishing your car   Homework: Pt to perform all 7 LSVT BIG exercises, 5 Functional Component Tasks, carryover task and big walking.    HOME EXERCISE PROGRAM:  LSVT BIG maximal daily exercises, functional component, hierarchy tasks and carryover task to be initiated next visit  GOALS: Goals reviewed with patient? Yes   SHORT TERM GOALS: Target date: 05/23/2022    Patient will be  independent in home exercise program to improve strength/mobility for better functional independence with ADLs. Baseline: to be initiated next visit; 4/10: pt not yet fully indep with HEP Goal status: IN PROGRESS   LONG TERM GOALS: Target date: 06/13/2022    Patient will increase FOTO score to equal to or greater than  86   to demonstrate improvement in mobility and quality of life.  Baseline: 81; 4/10: to be reassessed future visit Goal status: IN PROGRESS  2.  Patient (< 33 years old) will complete five times sit to stand test in < 10 seconds indicating an increased LE strength and improved balance. Baseline: 14.22 sec; 4/10: 11.13 sec hands-free Goal status: PARTIALLY MET  3.  Patient will  increase LLE SLB to at least 10 seconds to indicate improved balance/ability to safely clear obstacles and ambulate Baseline: LLE 5 seconds; 4/10: 30 seconds Goal status: MET  4.  Patient will increase 10 meter walk test to >1.1 m/s as to improve gait speed for better community ambulation and to reduce fall risk. Baseline:  1.01 m/s; 4/10: 1.5 m/s Goal status: MET   6.  Pt will improve ability to don both shoes while seated by at least 10 seconds in order to demonstrate improved functional mobility of BUE and increased ease in dressing. Baseline: 68 seconds; 4/10 35.5 seconds  Goal status: MET   ASSESSMENT:  CLINICAL IMPRESSION: Session somewhat limited secondary to pt late arrival. Pt, however, highly motivated to participate in session. Pt continues to require around 40% cuing for technique with maximal daily exercises. However, he shows improved activity tolerance by completing more reps today. Pt throughout shows improved speed/big effort with all interventions.  He still reports and exhibits difficult with addition of dual cog task with activities. The pt will benefit from LSVT BIG PT to address and improve these impairments to improve strength, gait, balance and mobility.   OBJECTIVE IMPAIRMENTS: Abnormal gait, decreased balance, decreased coordination, decreased knowledge of use of DME, decreased mobility, difficulty walking, decreased ROM, decreased strength, impaired sensation, impaired UE functional use, improper body mechanics, postural dysfunction, and pain.   ACTIVITY LIMITATIONS: carrying, squatting, stairs, transfers, bed mobility, dressing, and locomotion level  PARTICIPATION LIMITATIONS: meal prep, cleaning, laundry, shopping, community activity, occupation, and yard work  PERSONAL FACTORS: Age, Time since onset of injury/illness/exacerbation, and 1-2 comorbidities: rheumatoid arthritis, HTN, hx of shoulder surgery (date?)  are also affecting patient's functional outcome.    REHAB POTENTIAL: Good  CLINICAL DECISION MAKING: Evolving/moderate complexity  EVALUATION COMPLEXITY: Moderate  PLAN:  PT FREQUENCY: 4x/week  PT DURATION: 6 weeks  PLANNED INTERVENTIONS: Therapeutic exercises, Therapeutic activity, Neuromuscular re-education, Balance training, Gait training, Patient/Family education, Self Care, Joint mobilization, Stair training, Vestibular training, Canalith repositioning, Orthotic/Fit training, DME instructions, Electrical stimulation, Wheelchair mobility training, Spinal mobilization, Cryotherapy, Moist heat, Splintting, Taping, Traction, Manual therapy, and Re-evaluation  PLAN FOR NEXT SESSION: LVST BIG interventions, progress as pt able   Baird Kay, PT 05/24/2022, 5:41 PM

## 2022-05-28 ENCOUNTER — Ambulatory Visit: Payer: 59

## 2022-05-28 DIAGNOSIS — R262 Difficulty in walking, not elsewhere classified: Secondary | ICD-10-CM

## 2022-05-28 DIAGNOSIS — R278 Other lack of coordination: Secondary | ICD-10-CM

## 2022-05-28 DIAGNOSIS — M6281 Muscle weakness (generalized): Secondary | ICD-10-CM

## 2022-05-28 DIAGNOSIS — R2681 Unsteadiness on feet: Secondary | ICD-10-CM

## 2022-05-28 NOTE — Therapy (Signed)
OUTPATIENT PHYSICAL THERAPY LSVT TREATMENT NOTE    Patient Name: Tyler Glover MRN: [id number] DOB:December 02, 1960, 62 y.o., male Today's Date: 05/28/2022  PCP: Duanne Limerick, MD REFERRING PROVIDER: Lonell Face, MD  END OF SESSION:  PT End of Session - 05/28/22 1752     Visit Number 12    Number of Visits 17    Date for PT Re-Evaluation 06/14/22   additional 2 weeks in case of need to reschedule   PT Start Time 1646    PT Stop Time 1744    PT Time Calculation (min) 58 min    Equipment Utilized During Treatment Gait belt    Activity Tolerance Patient tolerated treatment well;No increased pain    Behavior During Therapy WFL for tasks assessed/performed               Past Medical History:  Diagnosis Date   Allergy    Arthritis    Rheumatoid   GERD (gastroesophageal reflux disease)    Hypertension    Past Surgical History:  Procedure Laterality Date   COLONOSCOPY     COLONOSCOPY WITH PROPOFOL N/A 08/21/2019   Procedure: COLONOSCOPY WITH PROPOFOL;  Surgeon: Midge Minium, MD;  Location: [facility];  Service: Endoscopy;  Laterality: N/A;  priority 4   ESOPHAGOGASTRODUODENOSCOPY (EGD) WITH PROPOFOL N/A 08/21/2019   Procedure: ESOPHAGOGASTRODUODENOSCOPY (EGD) WITH PROPOFOL;  Surgeon: Midge Minium, MD;  Location: [facility];  Service: Endoscopy;  Laterality: N/A;   POLYPECTOMY  08/21/2019   Procedure: POLYPECTOMY;  Surgeon: Midge Minium, MD;  Location: [facility];  Service: Endoscopy;;   SHOULDER SURGERY     TESTICLE SURGERY     age 71 or 24   TONSILLECTOMY     Patient Active Problem List   Diagnosis Date Noted   Encounter for screening colonoscopy    Polyp of transverse colon    Heartburn    Pure hypercholesterolemia 01/26/2016   Right lateral epicondylitis 07/07/2015   Vitamin D deficiency 12/24/2014   Prediabetes 12/24/2014   Allergic rhinitis, seasonal 06/03/2014   Acid reflux 06/03/2014   Essential (primary) hypertension 06/03/2014    Family history of diabetes mellitus 06/03/2014   Family history of cardiac disorder 06/03/2014   Obesity (BMI 30.0-34.9) 06/03/2014    ONSET DATE: 2 years ago  REFERRING DIAG: G20.C (ICD-10-CM) - Parkinsonism, unspecified   THERAPY DIAG:  Other lack of coordination  Difficulty in walking, not elsewhere classified  Muscle weakness (generalized)  Unsteadiness on feet  Rationale for Evaluation and Treatment: Rehabilitation  SUBJECTIVE:  SUBJECTIVE STATEMENT: Pt reports some LBP onset this weekend, and reports flare-up (has had in past, chronic issue). His back is somewhat better today. Pt reports no stumbles/falls.  Pt rates LBP as 3-4/10.   Pt accompanied by: self  PERTINENT HISTORY:   Pt is a pleasant 62 yo male presenting for LSVT BIG evaluation. He is ambulating without an AD. 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 He initially noticed difficulty with LLE and LUE, with LUE shaking. Pt was taking sinemet but reports this was discontinued after recent doctor appointment due to unwanted symptoms while on it. He has since changed to a new medication that he feels is helping.  He reports no falls within the past year.  He has noticed his gait has slowed down. He reports he has a L side limp. He takes more time to complete ADLs. 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 One of his goals is to improve his ability to work on cars as well as to improve his walking. PMH significant for rheumatoid arthritis, HTN, hx of shoulder surgery (date?)  PAIN:  Are you having pain? Yes: NPRS scale: 3-4/10 Pain location: low back and mid-upper back, bilateral hands (chronic) Pain description:    Aggravating factors:   Relieving factors:    PRECAUTIONS: Fall  WEIGHT BEARING RESTRICTIONS: No  FALLS: Has patient fallen in last 6 months? No  LIVING ENVIRONMENT: Lives with:  Lives in:  Stairs:  Has following equipment at home:   PLOF: Independent  PATIENT GOALS: He would like to improve his mobility of L side to improve his ability to work on cars with students, improve gait   Imaging: via chart MR brain 12/29/21: IMPRESSION: 1. Normal brain MRI. 2. NeuroQuant volumetric analysis of the brain, see details on [employer].    OBJECTIVE:    Assessment:     MMT:  LE strength grossly 4+/5 BLE    Coordination/Cerebellar: Finger to Nose: WFL Heel to Shin: Impaired:  slight decrease in speed LLE Rapid alternating movements: Impaired: LUE   Sensation: Impaired: reports decreased sensation LLE and LUE compared to RLE and RUE      Sit to stand assessment:  5x STS: 14.22 sec hands-free   Gait assessments:  10 MWT:  1.01 m/s  L foot scuff/decreased heel strike, absent l arm swing, lack of trunk rotation, slight crouched posture    Functional testing: Doffing shoes (bilateral): 27.5 seconds (must use UE to assist with crossing LLE ), performed seated  Donning shoes (bilateral): 68 seconds, performed seated. Notable decrease in speed with tying shoe laces, pt reports mostly because of LUE  Handwriting assessment: Pt R handed. WNL. Clear, big writing. Pt reports this has improved since medication change.  Coin stacking (6 coins): with RUE 12.5 seconds, LUE 30.5 seconds  Getting in and out of bed: in (using mat table) 30.5 seconds, out of bed 9 seconds. Uses forward approach to mat table>quadruped>roll>supine (significant decreased speed with roll). Pt reports he must use his R leg mostly to assist with bed mobility   Balance Assessments: SLB:  5 seconds LLE, 10 seconds RLE Able to clear obstacle (orange hurdle)   Ascending/descending steps: reciprocal pattern  ascend/descend UUE support     PATIENT SURVEYS:  FOTO 81   PATIENT EDUCATION:  Education details:  HEP, exercise technique, details of LSVT BIG protocol Person educated: Patient Education method: Explanation, Demonstration,  Tactile cues, Verbal cues, and Handouts Education comprehension: verbalized understanding, returned demonstration, verbal cues required, tactile cues required, and needs further education   TODAY'S TREATMENT:                                                                                                                                         DATE: 05/28/22   LSVT BIG Treatment:  Patient seen for LSVT Daily Session Maximal Daily Exercises for facilitation/coordination of movement Sustained movements are designed to rescale the amplitude of movement output for generalization to daily functional activities.  Maximal Daily Exercises: Floor to Ceiling 12 with 10 second holds Side to Side 12 Bilateral with 10 second hold Forward Step and Reach 15 Bilateral Sideways Step and Reach 15 Bilateral Backwards Step and Reach 15 Bilateral Forwards Rock and Reach 15 Bilateral Sideways Rock and Reach 15 Bilateral  Comments: Pt continues to require around 25% cuing. Minor corrections in the form of VC/demo on improving upright posture with exercises and abducting fingers of L hand    Functional Component movements:  5 reps of each of the following Sit to stand (required)  Rolling side-lying<>supine each direction - incorporated in to bed mobility SLB LLE (30 sec reps) Stack 6 coins LUE - emphasis on use of finger flicks  Donning shoes - cuing for use of finger flicks prior to tying shoes  Comments: Pt indep with technique for all functional component tasks. PT provided minor cuing for finger flicks with coin stacking, shoe-lace tying, cuing to not compensate with crunches for rolling  Hierarchies:  Getting in and out of bed 2x through with additional focus on rolling,  big STS from bed, and focus on big walking to mat table with addition of dual cog task today 2. Ambulating over uneven surfaces/obstacles with no AD- incorporated into Gait (big walking) see gait below for full details, continued emphasis on L arm-swing, upright posture, heel-toe pattern LLE with addition of dual cog task throughout and with 2.5# wrist weights donned for all but 2/10 laps. PT provided SBA, continued cuing throughout  Gait -  Pt completed 10 laps total (1 lap=148 ft) without AD, with 2.5# wrist weights each UE, with pt ambulating over one airex pad and two orange hurdles, weaving through multiple cones, ambulating over red large mat, with and without dual cog task throughout, last two laps pt focused on arm-swing with holding two PVC pipes with PT assist and no wrist weights. PT provides SBA throughout.  Carryover Assignment(s): When you use your left hand open it BIG!  Homework: Pt to perform all 7 LSVT BIG exercises, 5 Functional Component Tasks, carryover task and big walking.   No pain with interventions   HOME EXERCISE PROGRAM:  LSVT BIG maximal daily exercises, functional component, hierarchy tasks and carryover task to be initiated next visit  GOALS: Goals reviewed with patient? Yes   SHORT TERM GOALS:  Target date: 05/23/2022    Patient will be independent in home exercise program to improve strength/mobility for better functional independence with ADLs. Baseline: to be initiated next visit; 4/10: pt not yet fully indep with HEP Goal status: IN PROGRESS   LONG TERM GOALS: Target date: 06/13/2022    Patient will increase FOTO score to equal to or greater than  86   to demonstrate improvement in mobility and quality of life.  Baseline: 81; 4/10: to be reassessed future visit Goal status: IN PROGRESS  2.  Patient (< 54 years old) will complete five times sit to stand test in < 10 seconds indicating an increased LE strength and improved balance. Baseline: 14.22  sec; 4/10: 11.13 sec hands-free Goal status: PARTIALLY MET  3.  Patient will increase LLE SLB to at least 10 seconds to indicate improved balance/ability to safely clear obstacles and ambulate Baseline: LLE 5 seconds; 4/10: 30 seconds Goal status: MET  4.  Patient will increase 10 meter walk test to >1.1 m/s as to improve gait speed for better community ambulation and to reduce fall risk. Baseline:  1.01 m/s; 4/10: 1.5 m/s Goal status: MET   6.  Pt will improve ability to don both shoes while seated by at least 10 seconds in order to demonstrate improved functional mobility of BUE and increased ease in dressing. Baseline: 68 seconds; 4/10 35.5 seconds  Goal status: MET   ASSESSMENT:  CLINICAL IMPRESSION: Pt continues to advance interventions with increased reps and/or weights. Pt performed gait training today with wrist-weights, exhibiting good activity tolerance, but still primarily challenged with L arm swing and dual cog task. The pt generally requires around 25% cuing now with maximal daily exercises, indicating improved independence with interventions and carryover between sessions. The pt will benefit from LSVT BIG PT to address and improve these impairments to improve strength, gait, balance and mobility.   OBJECTIVE IMPAIRMENTS: Abnormal gait, decreased balance, decreased coordination, decreased knowledge of use of DME, decreased mobility, difficulty walking, decreased ROM, decreased strength, impaired sensation, impaired UE functional use, improper body mechanics, postural dysfunction, and pain.   ACTIVITY LIMITATIONS: carrying, squatting, stairs, transfers, bed mobility, dressing, and locomotion level  PARTICIPATION LIMITATIONS: meal prep, cleaning, laundry, shopping, community activity, occupation, and yard work  PERSONAL FACTORS: Age, Time since onset of injury/illness/exacerbation, and 1-2 comorbidities: rheumatoid arthritis, HTN, hx of shoulder surgery (date?)  are also  affecting patient's functional outcome.   REHAB POTENTIAL: Good  CLINICAL DECISION MAKING: Evolving/moderate complexity  EVALUATION COMPLEXITY: Moderate  PLAN:  PT FREQUENCY: 4x/week  PT DURATION: 6 weeks  PLANNED INTERVENTIONS: Therapeutic exercises, Therapeutic activity, Neuromuscular re-education, Balance training, Gait training, Patient/Family education, Self Care, Joint mobilization, Stair training, Vestibular training, Canalith repositioning, Orthotic/Fit training, DME instructions, Electrical stimulation, Wheelchair mobility training, Spinal mobilization, Cryotherapy, Moist heat, Splintting, Taping, Traction, Manual therapy, and Re-evaluation  PLAN FOR NEXT SESSION: LVST BIG interventions, progress as pt able   Baird Kay, PT 05/28/2022, 5:53 PM

## 2022-05-29 ENCOUNTER — Ambulatory Visit: Payer: 59

## 2022-05-29 DIAGNOSIS — R2681 Unsteadiness on feet: Secondary | ICD-10-CM

## 2022-05-29 DIAGNOSIS — R262 Difficulty in walking, not elsewhere classified: Secondary | ICD-10-CM

## 2022-05-29 DIAGNOSIS — R278 Other lack of coordination: Secondary | ICD-10-CM | POA: Diagnosis not present

## 2022-05-29 NOTE — Therapy (Signed)
OUTPATIENT PHYSICAL THERAPY LSVT TREATMENT NOTE    Patient Name: Tyler Glover MRN: [id number] DOB:06-22-1960, 62 y.o., male Today's Date: 05/29/2022  PCP: Duanne Limerick, MD REFERRING PROVIDER: Lonell Face, MD  END OF SESSION:  PT End of Session - 05/29/22 1744     Visit Number 13    Number of Visits 17    Date for PT Re-Evaluation 06/14/22   additional 2 weeks in case of need to reschedule   PT Start Time 1647    PT Stop Time 1751    PT Time Calculation (min) 64 min    Equipment Utilized During Treatment Gait belt    Activity Tolerance Patient tolerated treatment well    Behavior During Therapy WFL for tasks assessed/performed               Past Medical History:  Diagnosis Date   Allergy    Arthritis    Rheumatoid   GERD (gastroesophageal reflux disease)    Hypertension    Past Surgical History:  Procedure Laterality Date   COLONOSCOPY     COLONOSCOPY WITH PROPOFOL N/A 08/21/2019   Procedure: COLONOSCOPY WITH PROPOFOL;  Surgeon: Midge Minium, MD;  Location: [facility];  Service: Endoscopy;  Laterality: N/A;  priority 4   ESOPHAGOGASTRODUODENOSCOPY (EGD) WITH PROPOFOL N/A 08/21/2019   Procedure: ESOPHAGOGASTRODUODENOSCOPY (EGD) WITH PROPOFOL;  Surgeon: Midge Minium, MD;  Location: [facility];  Service: Endoscopy;  Laterality: N/A;   POLYPECTOMY  08/21/2019   Procedure: POLYPECTOMY;  Surgeon: Midge Minium, MD;  Location: [facility];  Service: Endoscopy;;   SHOULDER SURGERY     TESTICLE SURGERY     age 48 or 53   TONSILLECTOMY     Patient Active Problem List   Diagnosis Date Noted   Encounter for screening colonoscopy    Polyp of transverse colon    Heartburn    Pure hypercholesterolemia 01/26/2016   Right lateral epicondylitis 07/07/2015   Vitamin D deficiency 12/24/2014   Prediabetes 12/24/2014   Allergic rhinitis, seasonal 06/03/2014   Acid reflux 06/03/2014   Essential (primary) hypertension 06/03/2014   Family history  of diabetes mellitus 06/03/2014   Family history of cardiac disorder 06/03/2014   Obesity (BMI 30.0-34.9) 06/03/2014    ONSET DATE: 2 years ago  REFERRING DIAG: G20.C (ICD-10-CM) - Parkinsonism, unspecified   THERAPY DIAG:  Other lack of coordination  Difficulty in walking, not elsewhere classified  Unsteadiness on feet  Rationale for Evaluation and Treatment: Rehabilitation  SUBJECTIVE:  SUBJECTIVE STATEMENT: Pt reports after appointment yesterday he went for a walk with his wife and practiced his arm-swing technique. He reports back pain is about the same.   Pt accompanied by: self  PERTINENT HISTORY:   Pt is a pleasant 62 yo male presenting for LSVT BIG evaluation. He is ambulating without an AD. 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 He initially noticed difficulty with LLE and LUE, with LUE shaking. Pt was taking sinemet but reports this was discontinued after recent doctor appointment due to unwanted symptoms while on it. He has since changed to a new medication that he feels is helping.  He reports no falls within the past year.  He has noticed his gait has slowed down. He reports he has a L side limp. He takes more time to complete ADLs. 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 One of his goals is to improve his ability to work on cars as well as to improve his walking. PMH significant for rheumatoid arthritis, HTN, hx of shoulder surgery (date?)  PAIN:  Are you having pain? Yes: NPRS scale: 3-4/10 Pain location: low back and mid-upper back, bilateral hands (chronic) Pain description:   Aggravating factors:   Relieving factors:    PRECAUTIONS: Fall  WEIGHT BEARING  RESTRICTIONS: No  FALLS: Has patient fallen in last 6 months? No  LIVING ENVIRONMENT: Lives with:  Lives in:  Stairs:  Has following equipment at home:   PLOF: Independent  PATIENT GOALS: He would like to improve his mobility of L side to improve his ability to work on cars with students, improve gait   Imaging: via chart MR brain 12/29/21: IMPRESSION: 1. Normal brain MRI. 2. NeuroQuant volumetric analysis of the brain, see details on [employer].    OBJECTIVE:    Assessment:     MMT:  LE strength grossly 4+/5 BLE    Coordination/Cerebellar: Finger to Nose: WFL Heel to Shin: Impaired:  slight decrease in speed LLE Rapid alternating movements: Impaired: LUE   Sensation: Impaired: reports decreased sensation LLE and LUE compared to RLE and RUE      Sit to stand assessment:  5x STS: 14.22 sec hands-free   Gait assessments:  10 MWT:  1.01 m/s  L foot scuff/decreased heel strike, absent l arm swing, lack of trunk rotation, slight crouched posture    Functional testing: Doffing shoes (bilateral): 27.5 seconds (must use UE to assist with crossing LLE ), performed seated  Donning shoes (bilateral): 68 seconds, performed seated. Notable decrease in speed with tying shoe laces, pt reports mostly because of LUE  Handwriting assessment: Pt R handed. WNL. Clear, big writing. Pt reports this has improved since medication change.  Coin stacking (6 coins): with RUE 12.5 seconds, LUE 30.5 seconds  Getting in and out of bed: in (using mat table) 30.5 seconds, out of bed 9 seconds. Uses forward approach to mat table>quadruped>roll>supine (significant decreased speed with roll). Pt reports he must use his R leg mostly to assist with bed mobility   Balance Assessments: SLB:  5 seconds LLE, 10 seconds RLE Able to clear obstacle (orange hurdle)   Ascending/descending steps: reciprocal pattern ascend/descend UUE support     PATIENT SURVEYS:  FOTO 81   PATIENT EDUCATION:   Education details:  HEP, exercise technique, details of LSVT BIG protocol Person educated: Patient Education method: Explanation, Demonstration, Tactile cues, Verbal cues, and Handouts  Education comprehension: verbalized understanding, returned demonstration, verbal cues required, tactile cues required, and needs further education   TODAY'S TREATMENT:                                                                                                                                         DATE: 05/29/22   LSVT BIG Treatment:  Patient seen for LSVT Daily Session Maximal Daily Exercises for facilitation/coordination of movement Sustained movements are designed to rescale the amplitude of movement output for generalization to daily functional activities.  Maximal Daily Exercises: Floor to Ceiling 12 with 10 second holds  Side to Side 12 Bilateral with 10 second hold Forward Step and Reach 15 Bilateral Sideways Step and Reach 15 Bilateral Backwards Step and Reach 15 Bilateral Forwards Rock and Reach 15 Bilateral - pt reports some pain felt in R shoulder (reports hx of R shoulder surgery many years ago, reports this pain flairs every once in a while - cuing to adjust AROM to decrease R shoulder discomfort for last two interventions) Sideways Rock and Reach 15 Bilateral  Comments: Pt requires around 15-20% cuing. Minor corrections in the form of VC/demo on improving upright posture with exercises and abducting fingers of L hand, trunk rotation. Cuing withheld until about 5-6 reps in to transition to knowledge of results    Functional Component movements:  5 reps of each of the following Sit to stand (required)  Rolling side-lying<>supine each direction - incorporated in to bed mobility SLB LLE (30 sec reps) Selina Cooley 6 coins LUE - emphasis on use of finger flicks  Donning shoes - cuing for use of finger flicks prior to tying shoes  Comments: Pt exhibits independence with functional component  tasks. Pt is able to improve speed of donning shoes with increased VC from PT "increase effort/speed"  Hierarchies:  Getting in and out of bed 5x through with additional focus on rolling, big STS from bed, and focus on big walking to mat table with addition of dual cog task  2. Ambulating over uneven surfaces/obstacles with no AD- incorporated into Gait (big walking) see gait below for full details, continued emphasis on L arm-swing, upright posture, heel-toe pattern LLE with addition of dual cog task throughout and with 2.5# wrist weights donned LUE only. PT provided SBA, continued cuing throughout  Gait -  Pt completed 10 laps total (1 lap=148 ft) without AD, with 2.5# wrist weights LUE only, with pt ambulating over one shoe box, one orange hurdles, weaving through multiple cones, ambulating over red large mat, with and without dual cog task throughout, last two laps pt focused on heel-toe sequencing. PT provides SBA throughout.  Carryover Assignment(s): BIG posture and BIG steps while walking in church  Homework: Pt to perform all 7 LSVT BIG exercises, 5 Functional Component Tasks, carryover task and big walking.   No pain with interventions   HOME EXERCISE PROGRAM:  LSVT  BIG maximal daily exercises, functional component, hierarchy tasks and carryover task to be initiated next visit  GOALS: Goals reviewed with patient? Yes   SHORT TERM GOALS: Target date: 05/23/2022    Patient will be independent in home exercise program to improve strength/mobility for better functional independence with ADLs. Baseline: to be initiated next visit; 4/10: pt not yet fully indep with HEP Goal status: IN PROGRESS   LONG TERM GOALS: Target date: 06/13/2022    Patient will increase FOTO score to equal to or greater than  86   to demonstrate improvement in mobility and quality of life.  Baseline: 81; 4/10: to be reassessed future visit Goal status: IN PROGRESS  2.  Patient (< 28 years old) will  complete five times sit to stand test in < 10 seconds indicating an increased LE strength and improved balance. Baseline: 14.22 sec; 4/10: 11.13 sec hands-free Goal status: PARTIALLY MET  3.  Patient will increase LLE SLB to at least 10 seconds to indicate improved balance/ability to safely clear obstacles and ambulate Baseline: LLE 5 seconds; 4/10: 30 seconds Goal status: MET  4.  Patient will increase 10 meter walk test to >1.1 m/s as to improve gait speed for better community ambulation and to reduce fall risk. Baseline:  1.01 m/s; 4/10: 1.5 m/s Goal status: MET   6.  Pt will improve ability to don both shoes while seated by at least 10 seconds in order to demonstrate improved functional mobility of BUE and increased ease in dressing. Baseline: 68 seconds; 4/10 35.5 seconds  Goal status: MET   ASSESSMENT:  CLINICAL IMPRESSION: Transitioning to knowledge or results feedback as pt continues to exhibit increased independence with all interventions. Pt felt minor R shoulder pain during maximal daily exercises, remaining exercises modified to be pain-free. Will continue to monitor. The pt will benefit from LSVT BIG PT to address and improve these impairments to improve strength, gait, balance and mobility.   OBJECTIVE IMPAIRMENTS: Abnormal gait, decreased balance, decreased coordination, decreased knowledge of use of DME, decreased mobility, difficulty walking, decreased ROM, decreased strength, impaired sensation, impaired UE functional use, improper body mechanics, postural dysfunction, and pain.   ACTIVITY LIMITATIONS: carrying, squatting, stairs, transfers, bed mobility, dressing, and locomotion level  PARTICIPATION LIMITATIONS: meal prep, cleaning, laundry, shopping, community activity, occupation, and yard work  PERSONAL FACTORS: Age, Time since onset of injury/illness/exacerbation, and 1-2 comorbidities: rheumatoid arthritis, HTN, hx of shoulder surgery (date?)  are also affecting  patient's functional outcome.   REHAB POTENTIAL: Good  CLINICAL DECISION MAKING: Evolving/moderate complexity  EVALUATION COMPLEXITY: Moderate  PLAN:  PT FREQUENCY: 4x/week  PT DURATION: 6 weeks  PLANNED INTERVENTIONS: Therapeutic exercises, Therapeutic activity, Neuromuscular re-education, Balance training, Gait training, Patient/Family education, Self Care, Joint mobilization, Stair training, Vestibular training, Canalith repositioning, Orthotic/Fit training, DME instructions, Electrical stimulation, Wheelchair mobility training, Spinal mobilization, Cryotherapy, Moist heat, Splintting, Taping, Traction, Manual therapy, and Re-evaluation  PLAN FOR NEXT SESSION: LVST BIG interventions, progress as pt able   Baird Kay, PT 05/29/2022, 5:49 PM

## 2022-05-30 ENCOUNTER — Ambulatory Visit: Payer: 59

## 2022-05-30 DIAGNOSIS — R262 Difficulty in walking, not elsewhere classified: Secondary | ICD-10-CM

## 2022-05-30 DIAGNOSIS — R2681 Unsteadiness on feet: Secondary | ICD-10-CM

## 2022-05-30 DIAGNOSIS — R278 Other lack of coordination: Secondary | ICD-10-CM | POA: Diagnosis not present

## 2022-05-30 NOTE — Therapy (Signed)
OUTPATIENT PHYSICAL THERAPY LSVT TREATMENT NOTE    Patient Name: Tyler Glover MRN: [id number] DOB:1960-08-21, 62 y.o., male Today's Date: 05/30/2022  PCP: Duanne Limerick, MD REFERRING PROVIDER: Lonell Face, MD  END OF SESSION:  PT End of Session - 05/30/22 1747     Visit Number 14    Number of Visits 17    Date for PT Re-Evaluation 06/14/22   additional 2 weeks in case of need to reschedule   PT Start Time 1644    PT Stop Time 1741    PT Time Calculation (min) 57 min    Equipment Utilized During Treatment Gait belt    Activity Tolerance Patient tolerated treatment well;No increased pain    Behavior During Therapy WFL for tasks assessed/performed               Past Medical History:  Diagnosis Date   Allergy    Arthritis    Rheumatoid   GERD (gastroesophageal reflux disease)    Hypertension    Past Surgical History:  Procedure Laterality Date   COLONOSCOPY     COLONOSCOPY WITH PROPOFOL N/A 08/21/2019   Procedure: COLONOSCOPY WITH PROPOFOL;  Surgeon: Midge Minium, MD;  Location: [facility];  Service: Endoscopy;  Laterality: N/A;  priority 4   ESOPHAGOGASTRODUODENOSCOPY (EGD) WITH PROPOFOL N/A 08/21/2019   Procedure: ESOPHAGOGASTRODUODENOSCOPY (EGD) WITH PROPOFOL;  Surgeon: Midge Minium, MD;  Location: [facility];  Service: Endoscopy;  Laterality: N/A;   POLYPECTOMY  08/21/2019   Procedure: POLYPECTOMY;  Surgeon: Midge Minium, MD;  Location: [facility];  Service: Endoscopy;;   SHOULDER SURGERY     TESTICLE SURGERY     age 62 or 8   TONSILLECTOMY     Patient Active Problem List   Diagnosis Date Noted   Encounter for screening colonoscopy    Polyp of transverse colon    Heartburn    Pure hypercholesterolemia 01/26/2016   Right lateral epicondylitis 07/07/2015   Vitamin D deficiency 12/24/2014   Prediabetes 12/24/2014   Allergic rhinitis, seasonal 06/03/2014   Acid reflux 06/03/2014   Essential (primary) hypertension 06/03/2014    Family history of diabetes mellitus 06/03/2014   Family history of cardiac disorder 06/03/2014   Obesity (BMI 30.0-34.9) 06/03/2014    ONSET DATE: 2 years ago  REFERRING DIAG: G20.C (ICD-10-CM) - Parkinsonism, unspecified   THERAPY DIAG:  Other lack of coordination  Difficulty in walking, not elsewhere classified  Unsteadiness on feet  Rationale for Evaluation and Treatment: Rehabilitation  SUBJECTIVE:  SUBJECTIVE STATEMENT: Pt reports his back pain is still feeling the same. He reports R shoulder was a little sore this morning.    Pt accompanied by: self  PERTINENT HISTORY:   Pt is a pleasant 62 yo male presenting for LSVT BIG evaluation. He is ambulating without an AD. 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 He initially noticed difficulty with LLE and LUE, with LUE shaking. Pt was taking sinemet but reports this was discontinued after recent doctor appointment due to unwanted symptoms while on it. He has since changed to a new medication that he feels is helping.  He reports no falls within the past year.  He has noticed his gait has slowed down. He reports he has a L side limp. He takes more time to complete ADLs. 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 One of his goals is to improve his ability to work on cars as well as to improve his walking. PMH significant for rheumatoid arthritis, HTN, hx of shoulder surgery (date?)  PAIN:  Are you having pain? Yes: NPRS scale: 3-4/10 Pain location: low back and mid-upper back, bilateral hands (chronic) Pain description:   Aggravating factors:   Relieving factors:    PRECAUTIONS: Fall  WEIGHT BEARING RESTRICTIONS: No  FALLS: Has patient  fallen in last 6 months? No  LIVING ENVIRONMENT: Lives with:  Lives in:  Stairs:  Has following equipment at home:   PLOF: Independent  PATIENT GOALS: He would like to improve his mobility of L side to improve his ability to work on cars with students, improve gait   Imaging: via chart MR brain 12/29/21: IMPRESSION: 1. Normal brain MRI. 2. NeuroQuant volumetric analysis of the brain, see details on [employer].    OBJECTIVE:    Assessment:     MMT:  LE strength grossly 4+/5 BLE    Coordination/Cerebellar: Finger to Nose: WFL Heel to Shin: Impaired:  slight decrease in speed LLE Rapid alternating movements: Impaired: LUE   Sensation: Impaired: reports decreased sensation LLE and LUE compared to RLE and RUE      Sit to stand assessment:  5x STS: 14.22 sec hands-free   Gait assessments:  10 MWT:  1.01 m/s  L foot scuff/decreased heel strike, absent l arm swing, lack of trunk rotation, slight crouched posture    Functional testing: Doffing shoes (bilateral): 27.5 seconds (must use UE to assist with crossing LLE ), performed seated  Donning shoes (bilateral): 68 seconds, performed seated. Notable decrease in speed with tying shoe laces, pt reports mostly because of LUE  Handwriting assessment: Pt R handed. WNL. Clear, big writing. Pt reports this has improved since medication change.  Coin stacking (6 coins): with RUE 12.5 seconds, LUE 30.5 seconds  Getting in and out of bed: in (using mat table) 30.5 seconds, out of bed 9 seconds. Uses forward approach to mat table>quadruped>roll>supine (significant decreased speed with roll). Pt reports he must use his R leg mostly to assist with bed mobility   Balance Assessments: SLB:  5 seconds LLE, 10 seconds RLE Able to clear obstacle (orange hurdle)   Ascending/descending steps: reciprocal pattern ascend/descend UUE support     PATIENT SURVEYS:  FOTO 81   PATIENT EDUCATION:  Education details:  HEP, exercise  technique, details of LSVT BIG protocol Person educated: Patient Education method: Explanation, Demonstration, Tactile cues, Verbal cues, and Handouts Education comprehension: verbalized understanding, returned  demonstration, verbal cues required, tactile cues required, and needs further education   TODAY'S TREATMENT:                                                                                                                                         DATE: 05/30/22   LSVT BIG Treatment:  Patient seen for LSVT Daily Session Maximal Daily Exercises for facilitation/coordination of movement Sustained movements are designed to rescale the amplitude of movement output for generalization to daily functional activities.  Maximal Daily Exercises: Floor to Ceiling 12 with 10 second holds   Side to Side 12 Bilateral with 10 second hold  Forward Step and Reach 15 Bilateral Sideways Step and Reach 15 Bilateral Backwards Step and Reach 15 Bilateral Forwards Rock and Reach 15 Bilateral  Sideways Rock and Reach 15 Bilateral  Comments: Pt requires around 5-10% cuing. Minor corrections in the form of VC/demo on improving upright posture with exercises and abducting fingers of L hand. PT provides knowledge of results. Pt able to perform interventions pain-free   Functional Component movements:  5 reps of each of the following Sit to stand (required)  Rolling side-lying<>supine each direction - incorporated in to bed mobility SLB LLE (30 sec reps) Stack 6 coins LUE - emphasis on use of finger flicks  Donning shoes - cuing for use of finger flicks prior to tying shoes  Comments: Pt able to increase speed of tying laces with every rep today with VC   Hierarchies:  Getting in and out of bed 5x through with additional focus on rolling, big STS from bed, and focus on big walking to mat table with addition of dual cog task  2. Ambulating over uneven surfaces/obstacles with no AD- incorporated into Gait  (big walking) see gait below for full details, continued emphasis on L arm-swing, upright posture,  with and without dual cog task throughout and with 2.5# wrist weights donned each UE . PT provided SBA, continued cuing throughout  Gait -  Pt completed 11 laps total (1 lap=148 ft) without AD, with 2.5# wrist weights donned BUE, with pt ambulating through multiple cones, ambulating over red large mat, and onto and off of airex pad with and without dual cog task throughout. First two laps pt held two PVC pipes and PT assisted pt into increased bilat arm-swing. PT provides SBA throughout.  Carryover Assignment(s): Finger flicks before taking off shoes  Homework: Pt to perform all 7 LSVT BIG exercises, 5 Functional Component Tasks, carryover task and big walking.   No pain with interventions   HOME EXERCISE PROGRAM:  LSVT BIG maximal daily exercises, functional component, hierarchy tasks and carryover task to be initiated next visit  GOALS: Goals reviewed with patient? Yes   SHORT TERM GOALS: Target date: 05/23/2022    Patient will be independent in home exercise program to improve strength/mobility for better functional independence with ADLs. Baseline: to  be initiated next visit; 4/10: pt not yet fully indep with HEP Goal status: IN PROGRESS   LONG TERM GOALS: Target date: 06/13/2022    Patient will increase FOTO score to equal to or greater than  86   to demonstrate improvement in mobility and quality of life.  Baseline: 81; 4/10: to be reassessed future visit Goal status: IN PROGRESS  2.  Patient (< 13 years old) will complete five times sit to stand test in < 10 seconds indicating an increased LE strength and improved balance. Baseline: 14.22 sec; 4/10: 11.13 sec hands-free Goal status: PARTIALLY MET  3.  Patient will increase LLE SLB to at least 10 seconds to indicate improved balance/ability to safely clear obstacles and ambulate Baseline: LLE 5 seconds; 4/10: 30  seconds Goal status: MET  4.  Patient will increase 10 meter walk test to >1.1 m/s as to improve gait speed for better community ambulation and to reduce fall risk. Baseline:  1.01 m/s; 4/10: 1.5 m/s Goal status: MET   6.  Pt will improve ability to don both shoes while seated by at least 10 seconds in order to demonstrate improved functional mobility of BUE and increased ease in dressing. Baseline: 68 seconds; 4/10 35.5 seconds  Goal status: MET   ASSESSMENT:  CLINICAL IMPRESSION: Pt continues to exhibit carryover between sessions, now requiring only 5-10% cuing for maximal daily exercises, and observed improvement today in tying shoe-laces with increased speed. PT continues to provide multi-modal cues to promote increased L arm-swing. The pt will benefit from LSVT BIG PT to address and improve these impairments to improve strength, gait, balance and mobility.   OBJECTIVE IMPAIRMENTS: Abnormal gait, decreased balance, decreased coordination, decreased knowledge of use of DME, decreased mobility, difficulty walking, decreased ROM, decreased strength, impaired sensation, impaired UE functional use, improper body mechanics, postural dysfunction, and pain.   ACTIVITY LIMITATIONS: carrying, squatting, stairs, transfers, bed mobility, dressing, and locomotion level  PARTICIPATION LIMITATIONS: meal prep, cleaning, laundry, shopping, community activity, occupation, and yard work  PERSONAL FACTORS: Age, Time since onset of injury/illness/exacerbation, and 1-2 comorbidities: rheumatoid arthritis, HTN, hx of shoulder surgery (date?)  are also affecting patient's functional outcome.   REHAB POTENTIAL: Good  CLINICAL DECISION MAKING: Evolving/moderate complexity  EVALUATION COMPLEXITY: Moderate  PLAN:  PT FREQUENCY: 4x/week  PT DURATION: 6 weeks  PLANNED INTERVENTIONS: Therapeutic exercises, Therapeutic activity, Neuromuscular re-education, Balance training, Gait training, Patient/Family  education, Self Care, Joint mobilization, Stair training, Vestibular training, Canalith repositioning, Orthotic/Fit training, DME instructions, Electrical stimulation, Wheelchair mobility training, Spinal mobilization, Cryotherapy, Moist heat, Splintting, Taping, Traction, Manual therapy, and Re-evaluation  PLAN FOR NEXT SESSION: LVST BIG interventions, progress as pt able   Baird Kay, PT 05/30/2022, 5:52 PM

## 2022-05-31 ENCOUNTER — Ambulatory Visit: Payer: 59

## 2022-05-31 DIAGNOSIS — R2681 Unsteadiness on feet: Secondary | ICD-10-CM

## 2022-05-31 DIAGNOSIS — R278 Other lack of coordination: Secondary | ICD-10-CM | POA: Diagnosis not present

## 2022-05-31 DIAGNOSIS — R262 Difficulty in walking, not elsewhere classified: Secondary | ICD-10-CM

## 2022-05-31 NOTE — Therapy (Signed)
OUTPATIENT PHYSICAL THERAPY LSVT TREATMENT NOTE    Patient Name: Correll Denbow MRN: [id number] DOB:January 22, 1961, 62 y.o., male Today's Date: 05/31/2022  PCP: Duanne Limerick, MD REFERRING PROVIDER: Lonell Face, MD  END OF SESSION:  PT End of Session - 05/31/22 1736     Visit Number 15    Number of Visits 17    Date for PT Re-Evaluation 06/14/22   additional 2 weeks in case of need to reschedule   PT Start Time 1639    PT Stop Time 1730    PT Time Calculation (min) 51 min    Equipment Utilized During Treatment Gait belt    Activity Tolerance Patient tolerated treatment well;No increased pain    Behavior During Therapy WFL for tasks assessed/performed               Past Medical History:  Diagnosis Date   Allergy    Arthritis    Rheumatoid   GERD (gastroesophageal reflux disease)    Hypertension    Past Surgical History:  Procedure Laterality Date   COLONOSCOPY     COLONOSCOPY WITH PROPOFOL N/A 08/21/2019   Procedure: COLONOSCOPY WITH PROPOFOL;  Surgeon: Midge Minium, MD;  Location: [facility];  Service: Endoscopy;  Laterality: N/A;  priority 4   ESOPHAGOGASTRODUODENOSCOPY (EGD) WITH PROPOFOL N/A 08/21/2019   Procedure: ESOPHAGOGASTRODUODENOSCOPY (EGD) WITH PROPOFOL;  Surgeon: Midge Minium, MD;  Location: [facility];  Service: Endoscopy;  Laterality: N/A;   POLYPECTOMY  08/21/2019   Procedure: POLYPECTOMY;  Surgeon: Midge Minium, MD;  Location: [facility];  Service: Endoscopy;;   SHOULDER SURGERY     TESTICLE SURGERY     age 30 or 48   TONSILLECTOMY     Patient Active Problem List   Diagnosis Date Noted   Encounter for screening colonoscopy    Polyp of transverse colon    Heartburn    Pure hypercholesterolemia 01/26/2016   Right lateral epicondylitis 07/07/2015   Vitamin D deficiency 12/24/2014   Prediabetes 12/24/2014   Allergic rhinitis, seasonal 06/03/2014   Acid reflux 06/03/2014   Essential (primary) hypertension 06/03/2014    Family history of diabetes mellitus 06/03/2014   Family history of cardiac disorder 06/03/2014   Obesity (BMI 30.0-34.9) 06/03/2014    ONSET DATE: 2 years ago  REFERRING DIAG: G20.C (ICD-10-CM) - Parkinsonism, unspecified   THERAPY DIAG:  Other lack of coordination  Difficulty in walking, not elsewhere classified  Unsteadiness on feet  Rationale for Evaluation and Treatment: Rehabilitation  SUBJECTIVE:  SUBJECTIVE STATEMENT: Pt reports back pain is still the same, maybe somewhat better. R shoulder feeling OK. He reports he did not perform HEP after he got home because he fell asleep. He is tired today, did not sleep well.   Pt accompanied by: self  PERTINENT HISTORY:   Pt is a pleasant 62 yo male presenting for LSVT BIG evaluation. He is ambulating without an AD. 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 He initially noticed difficulty with LLE and LUE, with LUE shaking. Pt was taking sinemet but reports this was discontinued after recent doctor appointment due to unwanted symptoms while on it. He has since changed to a new medication that he feels is helping.  He reports no falls within the past year.  He has noticed his gait has slowed down. He reports he has a L side limp. He takes more time to complete ADLs. 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 One of his goals is to improve his ability to work on cars as well as to improve his walking. PMH significant for rheumatoid arthritis, HTN, hx of shoulder surgery (date?)  PAIN:  Are you having pain? Yes: NPRS scale: 3-4/10 Pain location: low back and mid-upper back, bilateral hands (chronic) Pain description:   Aggravating factors:    Relieving factors:    PRECAUTIONS: Fall  WEIGHT BEARING RESTRICTIONS: No  FALLS: Has patient fallen in last 6 months? No  LIVING ENVIRONMENT: Lives with:  Lives in:  Stairs:  Has following equipment at home:   PLOF: Independent  PATIENT GOALS: He would like to improve his mobility of L side to improve his ability to work on cars with students, improve gait   Imaging: via chart MR brain 12/29/21: IMPRESSION: 1. Normal brain MRI. 2. NeuroQuant volumetric analysis of the brain, see details on [employer].    OBJECTIVE:    Assessment:     MMT:  LE strength grossly 4+/5 BLE    Coordination/Cerebellar: Finger to Nose: WFL Heel to Shin: Impaired:  slight decrease in speed LLE Rapid alternating movements: Impaired: LUE   Sensation: Impaired: reports decreased sensation LLE and LUE compared to RLE and RUE      Sit to stand assessment:  5x STS: 14.22 sec hands-free   Gait assessments:  10 MWT:  1.01 m/s  L foot scuff/decreased heel strike, absent l arm swing, lack of trunk rotation, slight crouched posture    Functional testing: Doffing shoes (bilateral): 27.5 seconds (must use UE to assist with crossing LLE ), performed seated  Donning shoes (bilateral): 68 seconds, performed seated. Notable decrease in speed with tying shoe laces, pt reports mostly because of LUE  Handwriting assessment: Pt R handed. WNL. Clear, big writing. Pt reports this has improved since medication change.  Coin stacking (6 coins): with RUE 12.5 seconds, LUE 30.5 seconds  Getting in and out of bed: in (using mat table) 30.5 seconds, out of bed 9 seconds. Uses forward approach to mat table>quadruped>roll>supine (significant decreased speed with roll). Pt reports he must use his R leg mostly to assist with bed mobility   Balance Assessments: SLB:  5 seconds LLE, 10 seconds RLE Able to clear obstacle (orange hurdle)   Ascending/descending steps: reciprocal pattern ascend/descend UUE support      PATIENT SURVEYS:  FOTO 81   PATIENT EDUCATION:  Education details:  HEP, exercise technique, details of LSVT BIG protocol Person  educated: Patient Education method: Explanation, Demonstration, Tactile cues, Verbal cues, and Handouts Education comprehension: verbalized understanding, returned demonstration, verbal cues required, tactile cues required, and needs further education   TODAY'S TREATMENT:                                                                                                                                         DATE: 05/31/22   LSVT BIG Treatment:  Patient seen for LSVT Daily Session Maximal Daily Exercises for facilitation/coordination of movement Sustained movements are designed to rescale the amplitude of movement output for generalization to daily functional activities.  Maximal Daily Exercises: Floor to Ceiling 12 with 10 second holds   Side to Side 12 Bilateral with 10 second hold  Forward Step and Reach 15 Bilateral Sideways Step and Reach 15 Bilateral Backwards Step and Reach 15 Bilateral Forwards Rock and Reach 15 Bilateral  Sideways Rock and Reach 15 Bilateral  Comments: Pt requires around 5% cuing.    Functional Component movements:  5 reps of each of the following Sit to stand (required)  Rolling side-lying<>supine each direction - incorporated in to bed mobility SLB LLE (30 sec reps) Stack 6 coins LUE - emphasis on use of finger flicks  Donning shoes - cuing for use of finger flicks prior to tying shoes  Comments: Pt with some fatigue resulting in more bradykinetic movement today  Hierarchies:  Getting in and out of bed 5x through with additional focus on rolling, big STS from bed, and focus on big walking to mat table with addition of dual cog task  2. Ambulating over uneven surfaces/obstacles with no AD- incorporated into Gait (big walking) see gait below for full details   Gait -  Pt completed 2 laps with PVC each UE and PT assisting  pt into arm-swing. Remainder of gait training outside, performed up/down slopes, up/down curbs, changing surfaces (sidewalk<>grass), with focus on upright posture, arm-swing and heel-strike.   Carryover Assignment(s): Big technique with everything throughout the weekend and week!  Homework: Pt to perform all 7 LSVT BIG exercises, 5 Functional Component Tasks, carryover task and big walking.   No pain with interventions   HOME EXERCISE PROGRAM:  LSVT BIG maximal daily exercises, functional component, hierarchy tasks and carryover task to be initiated next visit  GOALS: Goals reviewed with patient? Yes   SHORT TERM GOALS: Target date: 05/23/2022    Patient will be independent in home exercise program to improve strength/mobility for better functional independence with ADLs. Baseline: to be initiated next visit; 4/10: pt not yet fully indep with HEP Goal status: IN PROGRESS   LONG TERM GOALS: Target date: 06/13/2022    Patient will increase FOTO score to equal to or greater than  86   to demonstrate improvement in mobility and quality of life.  Baseline: 81; 4/10: to be reassessed future visit Goal status: IN PROGRESS  2.  Patient (<  1 years old) will complete five times sit to stand test in < 10 seconds indicating an increased LE strength and improved balance. Baseline: 14.22 sec; 4/10: 11.13 sec hands-free Goal status: PARTIALLY MET  3.  Patient will increase LLE SLB to at least 10 seconds to indicate improved balance/ability to safely clear obstacles and ambulate Baseline: LLE 5 seconds; 4/10: 30 seconds Goal status: MET  4.  Patient will increase 10 meter walk test to >1.1 m/s as to improve gait speed for better community ambulation and to reduce fall risk. Baseline:  1.01 m/s; 4/10: 1.5 m/s Goal status: MET   6.  Pt will improve ability to don both shoes while seated by at least 10 seconds in order to demonstrate improved functional mobility of BUE and increased ease  in dressing. Baseline: 68 seconds; 4/10 35.5 seconds  Goal status: MET   ASSESSMENT:  CLINICAL IMPRESSION: Pt now consistently only requiring around 5% cuing for technique with interventions. He was able to advance gait training to outdoor surfaces, which did result in pt needing increased cuing, however, pt was able to self-correct throughout. The pt will benefit from LSVT BIG PT to address and improve these impairments to improve strength, gait, balance and mobility.   OBJECTIVE IMPAIRMENTS: Abnormal gait, decreased balance, decreased coordination, decreased knowledge of use of DME, decreased mobility, difficulty walking, decreased ROM, decreased strength, impaired sensation, impaired UE functional use, improper body mechanics, postural dysfunction, and pain.   ACTIVITY LIMITATIONS: carrying, squatting, stairs, transfers, bed mobility, dressing, and locomotion level  PARTICIPATION LIMITATIONS: meal prep, cleaning, laundry, shopping, community activity, occupation, and yard work  PERSONAL FACTORS: Age, Time since onset of injury/illness/exacerbation, and 1-2 comorbidities: rheumatoid arthritis, HTN, hx of shoulder surgery (date?)  are also affecting patient's functional outcome.   REHAB POTENTIAL: Good  CLINICAL DECISION MAKING: Evolving/moderate complexity  EVALUATION COMPLEXITY: Moderate  PLAN:  PT FREQUENCY: 4x/week  PT DURATION: 6 weeks  PLANNED INTERVENTIONS: Therapeutic exercises, Therapeutic activity, Neuromuscular re-education, Balance training, Gait training, Patient/Family education, Self Care, Joint mobilization, Stair training, Vestibular training, Canalith repositioning, Orthotic/Fit training, DME instructions, Electrical stimulation, Wheelchair mobility training, Spinal mobilization, Cryotherapy, Moist heat, Splintting, Taping, Traction, Manual therapy, and Re-evaluation  PLAN FOR NEXT SESSION: LVST BIG interventions, progress as pt able   Baird Kay,  PT 05/31/2022, 5:38 PM

## 2022-06-11 ENCOUNTER — Ambulatory Visit: Payer: 59

## 2022-06-11 DIAGNOSIS — R278 Other lack of coordination: Secondary | ICD-10-CM | POA: Diagnosis not present

## 2022-06-11 DIAGNOSIS — R262 Difficulty in walking, not elsewhere classified: Secondary | ICD-10-CM

## 2022-06-11 DIAGNOSIS — R2681 Unsteadiness on feet: Secondary | ICD-10-CM

## 2022-06-11 NOTE — Therapy (Signed)
OUTPATIENT PHYSICAL THERAPY LSVT TREATMENT NOTE    Patient Name: Tyler Glover MRN: [id number] DOB:February 06, 1961, 62 y.o., male Today's Date: 06/11/2022  PCP: Duanne Limerick, MD REFERRING PROVIDER: Lonell Face, MD  END OF SESSION:  PT End of Session - 06/11/22 1742     Visit Number 16    Number of Visits 17    Date for PT Re-Evaluation 06/14/22   additional 2 weeks in case of need to reschedule   PT Start Time 1640    PT Stop Time 1735    PT Time Calculation (min) 55 min    Equipment Utilized During Treatment Gait belt    Activity Tolerance Patient tolerated treatment well;No increased pain    Behavior During Therapy WFL for tasks assessed/performed                Past Medical History:  Diagnosis Date   Allergy    Arthritis    Rheumatoid   GERD (gastroesophageal reflux disease)    Hypertension    Past Surgical History:  Procedure Laterality Date   COLONOSCOPY     COLONOSCOPY WITH PROPOFOL N/A 08/21/2019   Procedure: COLONOSCOPY WITH PROPOFOL;  Surgeon: Midge Minium, MD;  Location: [facility];  Service: Endoscopy;  Laterality: N/A;  priority 4   ESOPHAGOGASTRODUODENOSCOPY (EGD) WITH PROPOFOL N/A 08/21/2019   Procedure: ESOPHAGOGASTRODUODENOSCOPY (EGD) WITH PROPOFOL;  Surgeon: Midge Minium, MD;  Location: [facility];  Service: Endoscopy;  Laterality: N/A;   POLYPECTOMY  08/21/2019   Procedure: POLYPECTOMY;  Surgeon: Midge Minium, MD;  Location: [facility];  Service: Endoscopy;;   SHOULDER SURGERY     TESTICLE SURGERY     age 89 or 45   TONSILLECTOMY     Patient Active Problem List   Diagnosis Date Noted   Encounter for screening colonoscopy    Polyp of transverse colon    Heartburn    Pure hypercholesterolemia 01/26/2016   Right lateral epicondylitis 07/07/2015   Vitamin D deficiency 12/24/2014   Prediabetes 12/24/2014   Allergic rhinitis, seasonal 06/03/2014   Acid reflux 06/03/2014   Essential (primary) hypertension  06/03/2014   Family history of diabetes mellitus 06/03/2014   Family history of cardiac disorder 06/03/2014   Obesity (BMI 30.0-34.9) 06/03/2014    ONSET DATE: 2 years ago  REFERRING DIAG: G20.C (ICD-10-CM) - Parkinsonism, unspecified   THERAPY DIAG:  Other lack of coordination  Difficulty in walking, not elsewhere classified  Unsteadiness on feet  Rationale for Evaluation and Treatment: Rehabilitation  SUBJECTIVE:  SUBJECTIVE STATEMENT: Pt reports moving better at competition this year.   Pt accompanied by: self  PERTINENT HISTORY:   Pt is a pleasant 62 yo male presenting for LSVT BIG evaluation. He is ambulating without an AD. 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 He initially noticed difficulty with LLE and LUE, with LUE shaking. Pt was taking sinemet but reports this was discontinued after recent doctor appointment due to unwanted symptoms while on it. He has since changed to a new medication that he feels is helping.  He reports no falls within the past year.  He has noticed his gait has slowed down. He reports he has a L side limp. He takes more time to complete ADLs. 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 One of his goals is to improve his ability to work on cars as well as to improve his walking. PMH significant for rheumatoid arthritis, HTN, hx of shoulder surgery (date?)  PAIN:  Are you having pain? Yes: NPRS scale: 3-4/10 Pain location: low back and mid-upper back, bilateral hands (chronic) Pain description:   Aggravating factors:   Relieving factors:    PRECAUTIONS: Fall  WEIGHT BEARING RESTRICTIONS: No  FALLS: Has patient fallen in last 6 months? No  LIVING  ENVIRONMENT: Lives with:  Lives in:  Stairs:  Has following equipment at home:   PLOF: Independent  PATIENT GOALS: He would like to improve his mobility of L side to improve his ability to work on cars with students, improve gait   Imaging: via chart MR brain 12/29/21: IMPRESSION: 1. Normal brain MRI. 2. NeuroQuant volumetric analysis of the brain, see details on [employer].    OBJECTIVE:    Assessment:     MMT:  LE strength grossly 4+/5 BLE    Coordination/Cerebellar: Finger to Nose: WFL Heel to Shin: Impaired:  slight decrease in speed LLE Rapid alternating movements: Impaired: LUE   Sensation: Impaired: reports decreased sensation LLE and LUE compared to RLE and RUE      Sit to stand assessment:  5x STS: 14.22 sec hands-free   Gait assessments:  10 MWT:  1.01 m/s  L foot scuff/decreased heel strike, absent l arm swing, lack of trunk rotation, slight crouched posture    Functional testing: Doffing shoes (bilateral): 27.5 seconds (must use UE to assist with crossing LLE ), performed seated  Donning shoes (bilateral): 68 seconds, performed seated. Notable decrease in speed with tying shoe laces, pt reports mostly because of LUE  Handwriting assessment: Pt R handed. WNL. Clear, big writing. Pt reports this has improved since medication change.  Coin stacking (6 coins): with RUE 12.5 seconds, LUE 30.5 seconds  Getting in and out of bed: in (using mat table) 30.5 seconds, out of bed 9 seconds. Uses forward approach to mat table>quadruped>roll>supine (significant decreased speed with roll). Pt reports he must use his R leg mostly to assist with bed mobility   Balance Assessments: SLB:  5 seconds LLE, 10 seconds RLE Able to clear obstacle (orange hurdle)   Ascending/descending steps: reciprocal pattern ascend/descend UUE support     PATIENT SURVEYS:  FOTO 81   PATIENT EDUCATION:  Education details:  HEP, exercise technique, details of LSVT BIG  protocol Person educated: Patient Education method: Explanation, Demonstration, Tactile cues, Verbal cues, and Handouts Education comprehension: verbalized understanding, returned demonstration, verbal cues required, tactile cues required, and needs further education  TODAY'S TREATMENT:                                                                                                                                         DATE: 06/11/22   LSVT BIG Treatment:  Patient seen for LSVT Daily Session Maximal Daily Exercises for facilitation/coordination of movement Sustained movements are designed to rescale the amplitude of movement output for generalization to daily functional activities.  Maximal Daily Exercises: Floor to Ceiling 15 with 10 second holds   Side to Side 15 Bilateral with 10 second hold  Forward Step and Reach 15 Bilateral Sideways Step and Reach 15 Bilateral Backwards Step and Reach 15 Bilateral Forwards Rock and Reach 15 Bilateral  Sideways Rock and Reach 15 Bilateral  Comments: Pt continues to require around 5% cuing for exercises 5 6, 7.    Functional Component movements:  5 reps of each of the following Sit to stand (required)  Rolling side-lying<>supine each direction - incorporated in to bed mobility SLB LLE (30 sec reps) Stack 6 coins LUE - emphasis on use of finger flicks  Donning shoes - cuing for use of finger flicks prior to tying shoes  Comments: Pt consistently able to donn shoes and stack coins with increased speed   Hierarchies:  Getting in and out of bed 5x through with additional focus on rolling, big STS from bed, and focus on big walking to mat table with addition of dual cog task  2. Ambulating over uneven surfaces/obstacles with no AD- incorporated into Gait (big walking) see gait below for full details   Gait -  Outside ambulation up/down slopes, steps, compliant and firm surfaces, up/down curbs with dual cog task, focus on arm-swing (L emphasis)  and big posture x several minutes.  Carryover Assignment(s): Big technique with everything throughout the weekend and week!  Homework: Pt to perform all 7 LSVT BIG exercises, 5 Functional Component Tasks, carryover task and big walking.   No pain with interventions   HOME EXERCISE PROGRAM:  LSVT BIG maximal daily exercises, functional component, hierarchy tasks and carryover task to be initiated next visit  GOALS: Goals reviewed with patient? Yes   SHORT TERM GOALS: Target date: 05/23/2022    Patient will be independent in home exercise program to improve strength/mobility for better functional independence with ADLs. Baseline: to be initiated next visit; 4/10: pt not yet fully indep with HEP Goal status: IN PROGRESS   LONG TERM GOALS: Target date: 06/13/2022    Patient will increase FOTO score to equal to or greater than  86   to demonstrate improvement in mobility and quality of life.  Baseline: 81; 4/10: to be reassessed future visit Goal status: IN PROGRESS  2.  Patient (< 46 years old) will complete five times sit to stand test in < 10 seconds indicating an increased LE strength and improved balance. Baseline: 14.22 sec; 4/10: 11.13 sec  hands-free Goal status: PARTIALLY MET  3.  Patient will increase LLE SLB to at least 10 seconds to indicate improved balance/ability to safely clear obstacles and ambulate Baseline: LLE 5 seconds; 4/10: 30 seconds Goal status: MET  4.  Patient will increase 10 meter walk test to >1.1 m/s as to improve gait speed for better community ambulation and to reduce fall risk. Baseline:  1.01 m/s; 4/10: 1.5 m/s Goal status: MET   6.  Pt will improve ability to don both shoes while seated by at least 10 seconds in order to demonstrate improved functional mobility of BUE and increased ease in dressing. Baseline: 68 seconds; 4/10 35.5 seconds  Goal status: MET   ASSESSMENT:  CLINICAL IMPRESSION: Pt has progressed all maximal daily  exercises to 15 reps, and additionally requires around 5% cuing with 5, 6,7 but is otherwise independent with technique with good ability to self-correct. L arm-swing with gait remains most challenge task for pt. The pt will benefit from LSVT BIG PT to address and improve these impairments to improve strength, gait, balance and mobility.   OBJECTIVE IMPAIRMENTS: Abnormal gait, decreased balance, decreased coordination, decreased knowledge of use of DME, decreased mobility, difficulty walking, decreased ROM, decreased strength, impaired sensation, impaired UE functional use, improper body mechanics, postural dysfunction, and pain.   ACTIVITY LIMITATIONS: carrying, squatting, stairs, transfers, bed mobility, dressing, and locomotion level  PARTICIPATION LIMITATIONS: meal prep, cleaning, laundry, shopping, community activity, occupation, and yard work  PERSONAL FACTORS: Age, Time since onset of injury/illness/exacerbation, and 1-2 comorbidities: rheumatoid arthritis, HTN, hx of shoulder surgery (date?)  are also affecting patient's functional outcome.   REHAB POTENTIAL: Good  CLINICAL DECISION MAKING: Evolving/moderate complexity  EVALUATION COMPLEXITY: Moderate  PLAN:  PT FREQUENCY: 4x/week  PT DURATION: 6 weeks  PLANNED INTERVENTIONS: Therapeutic exercises, Therapeutic activity, Neuromuscular re-education, Balance training, Gait training, Patient/Family education, Self Care, Joint mobilization, Stair training, Vestibular training, Canalith repositioning, Orthotic/Fit training, DME instructions, Electrical stimulation, Wheelchair mobility training, Spinal mobilization, Cryotherapy, Moist heat, Splintting, Taping, Traction, Manual therapy, and Re-evaluation  PLAN FOR NEXT SESSION: LVST BIG interventions, progress as pt able   Baird Kay, PT 06/11/2022, 5:46 PM

## 2022-06-12 ENCOUNTER — Ambulatory Visit: Payer: 59

## 2022-06-12 DIAGNOSIS — R278 Other lack of coordination: Secondary | ICD-10-CM | POA: Diagnosis not present

## 2022-06-12 DIAGNOSIS — R262 Difficulty in walking, not elsewhere classified: Secondary | ICD-10-CM

## 2022-06-12 NOTE — Therapy (Signed)
OUTPATIENT PHYSICAL THERAPY LSVT TREATMENT NOTE/DISCHARGE    Patient Name: Tyler Glover MRN: [id number] DOB:[birth year], 62 y.o., male Today's Date: 06/12/2022  PCP: Duanne Limerick, MD REFERRING PROVIDER: Lonell Face, MD  END OF SESSION:  PT End of Session - 06/12/22 1657     Visit Number 17    Number of Visits 17    Date for PT Re-Evaluation 06/14/22   additional 2 weeks in case of need to reschedule   PT Start Time 1631    PT Stop Time 1727    PT Time Calculation (min) 56 min    Equipment Utilized During Treatment Gait belt    Activity Tolerance Patient tolerated treatment well;No increased pain    Behavior During Therapy WFL for tasks assessed/performed                Past Medical History:  Diagnosis Date   Allergy    Arthritis    Rheumatoid   GERD (gastroesophageal reflux disease)    Hypertension    Past Surgical History:  Procedure Laterality Date   COLONOSCOPY     COLONOSCOPY WITH PROPOFOL N/A 08/21/2019   Procedure: COLONOSCOPY WITH PROPOFOL;  Surgeon: Midge Minium, MD;  Location: [facility];  Service: Endoscopy;  Laterality: N/A;  priority 4   ESOPHAGOGASTRODUODENOSCOPY (EGD) WITH PROPOFOL N/A 08/21/2019   Procedure: ESOPHAGOGASTRODUODENOSCOPY (EGD) WITH PROPOFOL;  Surgeon: Midge Minium, MD;  Location: [facility];  Service: Endoscopy;  Laterality: N/A;   POLYPECTOMY  08/21/2019   Procedure: POLYPECTOMY;  Surgeon: Midge Minium, MD;  Location: [facility];  Service: Endoscopy;;   SHOULDER SURGERY     TESTICLE SURGERY     age 19 or 89   TONSILLECTOMY     Patient Active Problem List   Diagnosis Date Noted   Encounter for screening colonoscopy    Polyp of transverse colon    Heartburn    Pure hypercholesterolemia 01/26/2016   Right lateral epicondylitis 07/07/2015   Vitamin D deficiency 12/24/2014   Prediabetes 12/24/2014   Allergic rhinitis, seasonal 06/03/2014   Acid reflux 06/03/2014   Essential (primary)  hypertension 06/03/2014   Family history of diabetes mellitus 06/03/2014   Family history of cardiac disorder 06/03/2014   Obesity (BMI 30.0-34.9) 06/03/2014    ONSET DATE: 2 years ago  REFERRING DIAG: G20.C (ICD-10-CM) - Parkinsonism, unspecified   THERAPY DIAG:  Other lack of coordination  Difficulty in walking, not elsewhere classified  Rationale for Evaluation and Treatment: Rehabilitation  SUBJECTIVE:  SUBJECTIVE STATEMENT: Pt just had infusion. Pt reports overall improvement in mobility since starting LSVT BIG.   Pt accompanied by: self  PERTINENT HISTORY:   Pt is a pleasant 62 yo male presenting for LSVT BIG evaluation. He is ambulating without an AD. 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 He initially noticed difficulty with LLE and LUE, with LUE shaking. Pt was taking sinemet but reports this was discontinued after recent doctor appointment due to unwanted symptoms while on it. He has since changed to a new medication that he feels is helping.  He reports no falls within the past year.  He has noticed his gait has slowed down. He reports he has a L side limp. He takes more time to complete ADLs. 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 One of his goals is to improve his ability to work on cars as well as to improve his walking. PMH significant for rheumatoid arthritis, HTN, hx of shoulder surgery (date?)  PAIN:  Are you having pain? Yes: NPRS scale: 3-4/10 Pain location: low back and mid-upper back, bilateral hands (chronic) Pain description:   Aggravating factors:   Relieving factors:    PRECAUTIONS: Fall  WEIGHT BEARING RESTRICTIONS: No  FALLS: Has patient fallen in last 6  months? No  LIVING ENVIRONMENT: Lives with:  Lives in:  Stairs:  Has following equipment at home:   PLOF: Independent  PATIENT GOALS: He would like to improve his mobility of L side to improve his ability to work on cars with students, improve gait   Imaging: via chart MR brain 12/29/21: IMPRESSION: 1. Normal brain MRI. 2. NeuroQuant volumetric analysis of the brain, see details on [employer].    OBJECTIVE:    Assessment:     MMT:  LE strength grossly 4+/5 BLE    Coordination/Cerebellar: Finger to Nose: WFL Heel to Shin: Impaired:  slight decrease in speed LLE Rapid alternating movements: Impaired: LUE   Sensation: Impaired: reports decreased sensation LLE and LUE compared to RLE and RUE      Sit to stand assessment:  5x STS: 14.22 sec hands-free   Gait assessments:  10 MWT:  1.01 m/s  L foot scuff/decreased heel strike, absent l arm swing, lack of trunk rotation, slight crouched posture    Functional testing: Doffing shoes (bilateral): 27.5 seconds (must use UE to assist with crossing LLE ), performed seated  Donning shoes (bilateral): 68 seconds, performed seated. Notable decrease in speed with tying shoe laces, pt reports mostly because of LUE  Handwriting assessment: Pt R handed. WNL. Clear, big writing. Pt reports this has improved since medication change.  Coin stacking (6 coins): with RUE 12.5 seconds, LUE 30.5 seconds  Getting in and out of bed: in (using mat table) 30.5 seconds, out of bed 9 seconds. Uses forward approach to mat table>quadruped>roll>supine (significant decreased speed with roll). Pt reports he must use his R leg mostly to assist with bed mobility   Balance Assessments: SLB:  5 seconds LLE, 10 seconds RLE Able to clear obstacle (orange hurdle)   Ascending/descending steps: reciprocal pattern ascend/descend UUE support     PATIENT SURVEYS:  FOTO 81   PATIENT EDUCATION:  Education details:  HEP, exercise technique, details of  LSVT BIG protocol Person educated: Patient Education method: Explanation, Demonstration, Tactile cues, Verbal cues, and Handouts Education comprehension: verbalized understanding, returned demonstration, verbal cues required, tactile cues required,  and needs further education   TODAY'S TREATMENT:                                                                                                                                         DATE: 06/12/22  Goal retesting completed on this date for discharge visit. Please refer to goal section below for details.   LSVT BIG Treatment:  Patient seen for LSVT Daily Session Maximal Daily Exercises for facilitation/coordination of movement Sustained movements are designed to rescale the amplitude of movement output for generalization to daily functional activities.  Maximal Daily Exercises: Floor to Ceiling 15 with 10 second holds   Side to Side 15 Bilateral with 10 second hold  Forward Step and Reach 15 Bilateral Sideways Step and Reach 15 Bilateral Backwards Step and Reach 15 Bilateral Forwards Rock and Reach 15 Bilateral  Sideways Rock and Reach 15 Bilateral  Comments: Pt exhibits increased speed with all interventions, <5% cuing for minor improvements of technique, pt however is independent with exercises   Functional Component movements:  5 reps of each of the following Sit to stand (required)  Rolling side-lying<>supine each direction - incorporated in to bed mobility SLB LLE (30 sec reps) Stack 6 coins LUE - emphasis on use of finger flicks  Donning shoes - cuing for use of finger flicks prior to tying shoes  Comments: Pt able to sustain 30 sec LLE SLB for all 5 reps. Pt also continues to exhibit increased speed with other functional component tasks  Hierarchies:  Getting in and out of bed 7x through with additional focus on rolling, big STS from bed, and focus on big walking to mat table with addition of dual cog task - incorporated into  gait  2. Ambulating over uneven surfaces/obstacles with no AD- incorporated into Gait (big walking) see gait below for full details   Gait -  Emphasis on ambulating with improved arm-swing (use of PVC for 7/10 laps, 1 lap=148 ft), addition of adding in task of ambulating to mat table, rolling, big stand from table and return to gait. Remaining laps completed without PVC assist, further cuing for heel-strike. Pt with some difficulty sustaining increased L arm swing into extension without physical cue.   Carryover Assignment(s): Continue big technique with everything after discharge!  Homework: Continue to practice LSVT BIG exercises beyond discharge  No pain with interventions   HOME EXERCISE PROGRAM:  LSVT BIG maximal daily exercises, functional component, hierarchy tasks and carryover task to be initiated next visit  GOALS: Goals reviewed with patient? Yes   SHORT TERM GOALS: Target date: 05/23/2022    Patient will be independent in home exercise program to improve strength/mobility for better functional independence with ADLs. Baseline: to be initiated next visit; 4/10: pt not yet fully indep with HEP; 06/12/2022 Pt reports he is indep with HEP Goal status: PARTIALLY MET   LONG TERM GOALS: Target date:  06/13/2022    Patient will increase FOTO score to equal to or greater than  86   to demonstrate improvement in mobility and quality of life.  Baseline: 81; 4/10: 06/12/2022: 79 Goal status: IN PROGRESS  2.  Patient (< 61 years old) will complete five times sit to stand test in < 10 seconds indicating an increased LE strength and improved balance. Baseline: 14.22 sec; 4/10: 11.13 sec hands-free; 06/12/2022: 8.9 seconds Goal status: MET  3.  Patient will increase LLE SLB to at least 10 seconds to indicate improved balance/ability to safely clear obstacles and ambulate Baseline: LLE 5 seconds; 4/10: 30 seconds; 06/12/2022: able to complete >30 sec SLB LLE Goal status: MET  4.   Patient will increase 10 meter walk test to >1.1 m/s as to improve gait speed for better community ambulation and to reduce fall risk. Baseline:  1.01 m/s; 4/10: 1.5 m/s; 06/12/2022 1.66 m/s  Goal status: MET   6.  Pt will improve ability to don both shoes while seated by at least 10 seconds in order to demonstrate improved functional mobility of BUE and increased ease in dressing. Baseline: 68 seconds; 4/10 35.5 seconds; 06/12/2022:  31.1 seconds  Goal status: MET   ASSESSMENT:  CLINICAL IMPRESSION: Pt has met all but one therapy goal, indicating improvements in BLE strength/power, increased gait speed/ability, improved ability to complete functional tasks, and decreased fall risk. Pt with similar, but slight decrease on FOTO score, however, per pt report this likely due to error with how he answered a question at eval (scoring himself too high initially). While pt shows significant progress with multiple outcome measures, he still struggles somewhat with sustaining increased L arm-swing throughout gait, particularly with dual cog task added. Pt also continues to require cuing to maintain L finger abduction. The pt has successfully completed LSVT BIG and is to be discharged on this date. The pt would benefit from transitioning to traditional physical therapy future date. OBJECTIVE IMPAIRMENTS: Abnormal gait, decreased balance, decreased coordination, decreased knowledge of use of DME, decreased mobility, difficulty walking, decreased ROM, decreased strength, impaired sensation, impaired UE functional use, improper body mechanics, postural dysfunction, and pain.   ACTIVITY LIMITATIONS: carrying, squatting, stairs, transfers, bed mobility, dressing, and locomotion level  PARTICIPATION LIMITATIONS: meal prep, cleaning, laundry, shopping, community activity, occupation, and yard work  PERSONAL FACTORS: Age, Time since onset of injury/illness/exacerbation, and 1-2 comorbidities: rheumatoid arthritis,  HTN, hx of shoulder surgery (date?)  are also affecting patient's functional outcome.   REHAB POTENTIAL: Good  CLINICAL DECISION MAKING: Evolving/moderate complexity  EVALUATION COMPLEXITY: Moderate  PLAN:  PT FREQUENCY: 4x/week  PT DURATION: 6 weeks  PLANNED INTERVENTIONS: Therapeutic exercises, Therapeutic activity, Neuromuscular re-education, Balance training, Gait training, Patient/Family education, Self Care, Joint mobilization, Stair training, Vestibular training, Canalith repositioning, Orthotic/Fit training, DME instructions, Electrical stimulation, Wheelchair mobility training, Spinal mobilization, Cryotherapy, Moist heat, Splintting, Taping, Traction, Manual therapy, and Re-evaluation  PLAN FOR NEXT SESSION: LVST BIG interventions, progress as pt able   Baird Kay, PT 06/12/2022, 5:39 PM

## 2022-06-13 ENCOUNTER — Ambulatory Visit: Payer: 59 | Attending: Neurology | Admitting: Speech Pathology

## 2022-06-13 ENCOUNTER — Encounter: Payer: Self-pay | Admitting: Speech Pathology

## 2022-06-13 DIAGNOSIS — R2681 Unsteadiness on feet: Secondary | ICD-10-CM | POA: Diagnosis present

## 2022-06-13 DIAGNOSIS — R471 Dysarthria and anarthria: Secondary | ICD-10-CM | POA: Insufficient documentation

## 2022-06-13 DIAGNOSIS — R262 Difficulty in walking, not elsewhere classified: Secondary | ICD-10-CM | POA: Insufficient documentation

## 2022-06-13 DIAGNOSIS — R278 Other lack of coordination: Secondary | ICD-10-CM | POA: Diagnosis present

## 2022-06-13 DIAGNOSIS — M6281 Muscle weakness (generalized): Secondary | ICD-10-CM | POA: Insufficient documentation

## 2022-06-13 NOTE — Therapy (Signed)
OUTPATIENT SPEECH LANGUAGE PATHOLOGY MOTOR SPEECH EVALUATION   Patient Name: Tyler Glover MRN: [id number] DOB:09/16/1960, 62 y.o., male Today's Date: 06/13/2022  PCP: Duanne Limerick, MD  REFERRING PROVIDER: Lonell Face, MD    End of Session - 06/13/22 1513     Visit Number 1    Number of Visits 17    Date for SLP Re-Evaluation 09/11/22    SLP Start Time 1500    SLP Stop Time  1600    SLP Time Calculation (min) 60 min    Activity Tolerance Patient tolerated treatment well             Past Medical History:  Diagnosis Date   Allergy    Arthritis    Rheumatoid   GERD (gastroesophageal reflux disease)    Hypertension    Past Surgical History:  Procedure Laterality Date   COLONOSCOPY     COLONOSCOPY WITH PROPOFOL N/A 08/21/2019   Procedure: COLONOSCOPY WITH PROPOFOL;  Surgeon: Midge Minium, MD;  Location: [facility];  Service: Endoscopy;  Laterality: N/A;  priority 4   ESOPHAGOGASTRODUODENOSCOPY (EGD) WITH PROPOFOL N/A 08/21/2019   Procedure: ESOPHAGOGASTRODUODENOSCOPY (EGD) WITH PROPOFOL;  Surgeon: Midge Minium, MD;  Location: [facility];  Service: Endoscopy;  Laterality: N/A;   POLYPECTOMY  08/21/2019   Procedure: POLYPECTOMY;  Surgeon: Midge Minium, MD;  Location: [facility];  Service: Endoscopy;;   SHOULDER SURGERY     TESTICLE SURGERY     age 52 or 4   TONSILLECTOMY     Patient Active Problem List   Diagnosis Date Noted   Encounter for screening colonoscopy    Polyp of transverse colon    Heartburn    Pure hypercholesterolemia 01/26/2016   Right lateral epicondylitis 07/07/2015   Vitamin D deficiency 12/24/2014   Prediabetes 12/24/2014   Allergic rhinitis, seasonal 06/03/2014   Acid reflux 06/03/2014   Essential (primary) hypertension 06/03/2014   Family history of diabetes mellitus 06/03/2014   Family history of cardiac disorder 06/03/2014   Obesity (BMI 30.0-34.9) 06/03/2014    ONSET DATE: PD dx 12/01/2021, referral date  02/06/22  REFERRING DIAG: Parkinsonism  THERAPY DIAG:  Dysarthria and anarthria  Rationale for Evaluation and Treatment Rehabilitation  SUBJECTIVE:   SUBJECTIVE STATEMENT: Pt reports noting his students and coworkers leaning in when he talks at times. Pt accompanied by: self  PERTINENT HISTORY: Pt is a 62 year-old male who presents for motor speech evaluation. Pt reports initial symptom onset approximately 2 years ago. Recently changed from sinemet to amantidine due to unwanted symptoms. 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 He has also gotten feedback about flat intonation in his speech. His job requires high Tour manager, teaching over noise, and workplace meetings. PMH significant for rheumatoid arthritis, HTN, reflux.   DIAGNOSTIC FINDINGS: MRI Brain 01/01/22: "normal brain MRI"  PAIN:  Are you having pain? Yes: NPRS scale: 3/10 Pain location: joints   FALLS: Has patient fallen in last 6 months? 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 Water intake:  no water; gatorade zero Caffeine/alcohol  intake:  3-4 cups per day Daily voice use:  a lot when lecturing; estimates 3 hours of steady conversation, plus meetings  ORAL MOTOR EXAMINATION Facial : WFL Lingual: WFL Velum: WFL Mandible: WFL Cough: WFL Voice: Other: low vocal intensity    Patient does not report difficulty swallowing which does not warrant further evaluation  PERCEPTUAL VOICE ASSESSMENT: Voice quality: low  vocal intensity Resonance: normal Respiratory function: thoracic breathing   MOTOR SPEECH EVALUATION: Maximum phonation time for sustained "ah": 33.6 (hoarse, with strain) Mean intensity during sustained "ah": 72 dB  Mean fundamental frequency during sustained "ah": 153 Hz (within 1 SD average of 145 Hz +/- 23 for gender) Mean intensity sustained during conversational speech: 63 dB Habitual pitch: 132 Hz Highest dynamic pitch when altering pitch from a low note to a high note: 234 Hz Lowest dynamic pitch when altering from a high note to a low note: 114 Hz Highest dynamic pitch during conversational speech: 196 Hz Lowest dynamic pitch during conversational speech: 81 Hz Average time patient was able to sustain /s/: 13.1 seconds Average time patient was able to sustain /z/: 12.1 seconds s/z ratio : 1.08  Speech is characterized by Fast rate, Monopitch, and intermittently hoarse vocal quality. For trained listener in quiet environment with context, intelligibility was 100%.   Patient able to improve all parameters with model Stimulability: Improved vocal quality with loud voice (81 dB) for sustained vowel,  and phrases averaged 72 dB. Improved pitch range, vocal quality, and loudness with model and cues improved pitch range to 71-465 Hz, as well as loudness to 70 dB     PATIENT REPORTED OUTCOME  VOICE HANDICAP INDEX (VHI)  The Voice Handicap Index is comprised of a series of questions to assess the patient's perception of their voice. It is designed to evaluate the emotional, physical and functional components of the voice problem.  Functional: 6 Physical: 3 Emotional: 0 Total: 9 (Normal mean 8.75, SD =14.97)  z score =  0.02 no significant impact = 0-1.00, mild = 1.01-1.99, moderate = 2.00-2.99, severe = 3.00+   PATIENT EDUCATION: Education details: proposed therapy goals, sensory changes related to voice/PD Person educated: Patient Education method: Software engineer Education comprehension: verbalized understanding, returned demonstration, verbal cues required, and needs further education   HOME EXERCISE PROGRAM: To be provided next session  GOALS: Goals reviewed with patient? Yes  SHORT TERM GOALS: Target date: 10 sessions  Patient will demo HEP for dysarthria accurately with min cues. Baseline: Goal status: INITIAL  2.  Patient will maintain adequate vocal quality and intensity (>70dB) in sentence level responses 90% accuracy. Baseline:  Goal status: INITIAL  3.  Patient will use dysarthria compensations (slow, loud, overpronounce, pause) in 2-3 sentence responses 90% accuracy with moderate cues. Baseline:  Goal status: INITIAL    LONG TERM GOALS: Target date: 09/11/2022  Patient will demo HEP for dysarthria independently. Baseline:  Goal status: INITIAL  2.  Patient will maintain intensity (avg >70dB) in 25 minutes conversation with modified independence Baseline:  Goal status: INITIAL  3.  Patient will use dysarthria compensations (slow, loud, overpronounce, pause) for intelligibility >90% in 25 minutes conversation. Baseline:  Goal status: INITIAL  4.  Patient will report improved satisfaction with vocal quality in the workplace than prior to ST.  Baseline:  Goal status: INITIAL  ASSESSMENT:  CLINICAL IMPRESSION: Patient presents with mild hypokinetic dysarthria characterized by reduced vocal intensity, reduced breath support, reduced pitch variability, intermittent rapid rate, and intermittent hoarse vocal  quality. Patient reports noting others leaning in to hear him. He is an Producer, television/film/video and has received feedback from his students that his voice is flat/monotone. With diagnostic assessment, patient was highly stimulable for improvements in vocal quality, pitch variability, and loudness using intensity-based cues. He is an excellent candidate for LSVT-LOUD, however with scheduling conflicts he reports  he is unable to commit to 4x a week for 4 weeks for this intervention. Will utilize intensity-based approach 2x per week for 8 weeks. I recommend skilled ST to improve vocal quality, endurance, and intensity for improved intelligibility and engagement of his students, and to meet other vocal demands of work and home.   OBJECTIVE IMPAIRMENTS include dysarthria. These impairments are limiting patient from effectively communicating at home and in community. Factors affecting potential to achieve goals and functional outcome are  scheduling; pt having difficulty coordinating LSVT schedule with work . Patient will benefit from skilled SLP services to address above impairments and improve overall function.  REHAB POTENTIAL: Excellent  PLAN: SLP FREQUENCY: 2x/week  SLP DURATION: 8 weeks *may delay start date due to scheduling conflicts; may need to change to 2x per week for 8 weeks   PLANNED INTERVENTIONS: Functional tasks, SLP instruction and feedback, Patient/family education, and Re-evaluation    Rondel Baton, MS, Corporate investment banker Pathologist [phone]    [facility] at [facility] [address] [place], [place], [address] Phone: [phone]   Fax:  [phone]

## 2022-06-18 ENCOUNTER — Ambulatory Visit: Payer: 59 | Admitting: Speech Pathology

## 2022-06-19 ENCOUNTER — Ambulatory Visit: Payer: 59 | Admitting: Speech Pathology

## 2022-06-20 ENCOUNTER — Ambulatory Visit: Payer: 59 | Admitting: Speech Pathology

## 2022-06-21 ENCOUNTER — Other Ambulatory Visit: Payer: Self-pay

## 2022-06-21 ENCOUNTER — Ambulatory Visit: Payer: 59 | Admitting: Speech Pathology

## 2022-06-21 DIAGNOSIS — I10 Essential (primary) hypertension: Secondary | ICD-10-CM

## 2022-06-21 MED ORDER — LISINOPRIL 40 MG PO TABS: ORAL_TABLET | ORAL | 0 refills | Status: DC

## 2022-06-25 ENCOUNTER — Ambulatory Visit: Payer: 59 | Admitting: Speech Pathology

## 2022-06-26 ENCOUNTER — Encounter: Payer: 59 | Admitting: Speech Pathology

## 2022-06-27 ENCOUNTER — Encounter: Payer: 59 | Admitting: Speech Pathology

## 2022-06-28 ENCOUNTER — Encounter: Payer: 59 | Admitting: Speech Pathology

## 2022-07-02 ENCOUNTER — Encounter: Payer: 59 | Admitting: Speech Pathology

## 2022-07-02 ENCOUNTER — Ambulatory Visit: Payer: 59 | Admitting: Speech Pathology

## 2022-07-02 DIAGNOSIS — R471 Dysarthria and anarthria: Secondary | ICD-10-CM

## 2022-07-02 NOTE — Therapy (Signed)
OUTPATIENT SPEECH LANGUAGE PATHOLOGY SPEECH TREATMENT   Patient Name: Tyler Glover MRN: [id number] DOB:09-Apr-1960, 62 y.o., male Today's Date: 07/02/2022  PCP: Duanne Limerick, MD  REFERRING PROVIDER: Lonell Face, MD    End of Session - 07/02/22 [phone]     Visit Number 2    Number of Visits 17    Date for SLP Re-Evaluation 09/11/22    SLP Start Time 0900    SLP Stop Time  1000    SLP Time Calculation (min) 60 min    Activity Tolerance Patient tolerated treatment well             Past Medical History:  Diagnosis Date   Allergy    Arthritis    Rheumatoid   GERD (gastroesophageal reflux disease)    Hypertension    Past Surgical History:  Procedure Laterality Date   COLONOSCOPY     COLONOSCOPY WITH PROPOFOL N/A 08/21/2019   Procedure: COLONOSCOPY WITH PROPOFOL;  Surgeon: Midge Minium, MD;  Location: [facility];  Service: Endoscopy;  Laterality: N/A;  priority 4   ESOPHAGOGASTRODUODENOSCOPY (EGD) WITH PROPOFOL N/A 08/21/2019   Procedure: ESOPHAGOGASTRODUODENOSCOPY (EGD) WITH PROPOFOL;  Surgeon: Midge Minium, MD;  Location: [facility];  Service: Endoscopy;  Laterality: N/A;   POLYPECTOMY  08/21/2019   Procedure: POLYPECTOMY;  Surgeon: Midge Minium, MD;  Location: [facility];  Service: Endoscopy;;   SHOULDER SURGERY     TESTICLE SURGERY     age 89 or 33   TONSILLECTOMY     Patient Active Problem List   Diagnosis Date Noted   Encounter for screening colonoscopy    Polyp of transverse colon    Heartburn    Pure hypercholesterolemia 01/26/2016   Right lateral epicondylitis 07/07/2015   Vitamin D deficiency 12/24/2014   Prediabetes 12/24/2014   Allergic rhinitis, seasonal 06/03/2014   Acid reflux 06/03/2014   Essential (primary) hypertension 06/03/2014   Family history of diabetes mellitus 06/03/2014   Family history of cardiac disorder 06/03/2014   Obesity (BMI 30.0-34.9) 06/03/2014    ONSET DATE: PD dx 12/01/2021, referral date  02/06/22  REFERRING DIAG: Parkinsonism  THERAPY DIAG:  Dysarthria and anarthria  Rationale for Evaluation and Treatment Rehabilitation  SUBJECTIVE:   SUBJECTIVE STATEMENT: Pt has decided to do ST 2x per week   Pt accompanied by: self  PERTINENT HISTORY: Pt is a 62 year-old male who presents for motor speech evaluation. Pt reports initial symptom onset approximately 2 years ago. Recently changed from sinemet to amantidine due to unwanted symptoms. 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 He has also gotten feedback about flat intonation in his speech. His job requires high Tour manager, teaching over noise, and workplace meetings. PMH significant for rheumatoid arthritis, HTN, reflux.   DIAGNOSTIC FINDINGS: MRI Brain 01/01/22: "normal brain MRI"  PAIN:  Are you having pain? Yes: NPRS scale: 3/10 Pain location: joints   PATIENT GOALS  "So people can hear me."  OBJECTIVE:  TODAY'S TREATMENT: Initiated training in HEP for dysarthria: sustained /a/ at gradually increasing pitches  (avg 120 Hz, 174 Hz, 187 Hz, 212 Hz, 230 Hz), with avg duration 9.6 seconds, 80 dB. Glides 146-280, avg 78 dB. Worked with pt to generate personally relevant phrase list, pt read avg 73 dB, range 144-238 Hz. Moderate cues for awareness of low vocal intensity between structured tasks (range 61-68 db).   PATIENT EDUCATION: Education details: proposed therapy  goals, sensory changes related to voice/PD Person educated: Patient Education method: Medical illustrator Education comprehension: verbalized understanding, returned demonstration, verbal cues required, and needs further education   HOME EXERCISE PROGRAM: To be provided next session  GOALS: Goals reviewed with patient? Yes  SHORT TERM GOALS: Target date: 10 sessions  Patient will demo HEP for dysarthria accurately with min cues. Baseline: Goal status:  INITIAL  2.  Patient will maintain adequate vocal quality and intensity (>70dB) in sentence level responses 90% accuracy. Baseline:  Goal status: INITIAL  3.  Patient will use dysarthria compensations (slow, loud, overpronounce, pause) in 2-3 sentence responses 90% accuracy with moderate cues. Baseline:  Goal status: INITIAL    LONG TERM GOALS: Target date: 09/11/2022  Patient will demo HEP for dysarthria independently. Baseline:  Goal status: INITIAL  2.  Patient will maintain intensity (avg >70dB) in 25 minutes conversation with modified independence Baseline:  Goal status: INITIAL  3.  Patient will use dysarthria compensations (slow, loud, overpronounce, pause) for intelligibility >90% in 25 minutes conversation. Baseline:  Goal status: INITIAL  4.  Patient will report improved satisfaction with vocal quality in the workplace than prior to ST.  Baseline:  Goal status: INITIAL  ASSESSMENT:  CLINICAL IMPRESSION: Patient presents with mild hypokinetic dysarthria characterized by reduced vocal intensity, reduced breath support, reduced pitch variability, intermittent rapid rate, and intermittent hoarse vocal quality. Patient reports noting others leaning in to hear him. He is an Producer, television/film/video and has received feedback from his students that his voice is flat/monotone. Patient responds well to audio feedback and increases vocal intensity with occasional min-mod cues today. Will utilize intensity-based approach 2x per week for 8 weeks. I recommend skilled ST to improve vocal quality, endurance, and intensity for improved intelligibility and engagement of his students, and to meet other vocal demands of work and home.   OBJECTIVE IMPAIRMENTS include dysarthria. These impairments are limiting patient from effectively communicating at home and in community. Factors affecting potential to achieve goals and functional outcome are  scheduling; pt having difficulty coordinating LSVT  schedule with work . Patient will benefit from skilled SLP services to address above impairments and improve overall function.  REHAB POTENTIAL: Excellent  PLAN: SLP FREQUENCY: 2x/week  SLP DURATION: 8 weeks *may delay start date due to scheduling conflicts; may need to change to 2x per week for 8 weeks   PLANNED INTERVENTIONS: Functional tasks, SLP instruction and feedback, Patient/family education, and Re-evaluation    Rondel Baton, MS, Corporate investment banker Pathologist [phone]    [facility] at [facility] [address] [place], [place], [address] Phone: [phone]   Fax:  [phone]

## 2022-07-02 NOTE — Patient Instructions (Addendum)
Speech Exercise routine: Every day!  Say "ahhhh" LOUD (5 Stair steps)  1. Start with a lower pitch, say "ahh" and hold it for as as long as you can with good quality, effort 8/10. Repeat 3 times  2. Increase your pitch slightly, do the same thing, repeat 3 x  3. Increase your pitch again, do the same thing, repeat 3 x.  4. Increase your pitch again, do the same thing, repeat 3 x.  5. Increase your pitch again, do the same thing, repeat 3 x.   Glides  Start low, say the word "knoll" as you glide up in pitch: 5 times. Remember to keep your effort 8/10. Breathe in between each one.   Start high, say the word "knoll" as you glide down in pitch 5 times. Remember to keep your effort 8/10. Breathe in between each one.    Everyday phrases: Read out loud, effort 8/10. Repeat 3x.  Good morning, baby.  Heavenly father, give guidance to watch over our family. Do you want more coffee? Have a great day! I love you! Don't take any wooden nickels. Sign on to canvas. Get your textbooks.  Today we're going to review from yesterday. Break time is 9:30, return back at 9:45. Get your worksheets out. Let's grab our safety glasses. Let's head on out to the shop. Hello, baby. How was your day?   Loud Reading: 10-15 minutes Effort 8/10, you can use handouts provided or read your own material

## 2022-07-03 ENCOUNTER — Encounter: Payer: 59 | Admitting: Speech Pathology

## 2022-07-04 ENCOUNTER — Ambulatory Visit (INDEPENDENT_AMBULATORY_CARE_PROVIDER_SITE_OTHER): Payer: 59 | Admitting: Family Medicine

## 2022-07-04 ENCOUNTER — Encounter: Payer: 59 | Admitting: Speech Pathology

## 2022-07-04 ENCOUNTER — Encounter: Payer: Self-pay | Admitting: Family Medicine

## 2022-07-04 VITALS — BP 128/78 | HR 72 | Ht 70.0 in | Wt 213.0 lb

## 2022-07-04 DIAGNOSIS — N529 Male erectile dysfunction, unspecified: Secondary | ICD-10-CM

## 2022-07-04 DIAGNOSIS — M26649 Arthritis of unspecified temporomandibular joint: Secondary | ICD-10-CM | POA: Diagnosis not present

## 2022-07-04 DIAGNOSIS — H6993 Unspecified Eustachian tube disorder, bilateral: Secondary | ICD-10-CM

## 2022-07-04 DIAGNOSIS — I10 Essential (primary) hypertension: Secondary | ICD-10-CM

## 2022-07-04 DIAGNOSIS — R351 Nocturia: Secondary | ICD-10-CM

## 2022-07-04 DIAGNOSIS — R7303 Prediabetes: Secondary | ICD-10-CM

## 2022-07-04 DIAGNOSIS — E7800 Pure hypercholesterolemia, unspecified: Secondary | ICD-10-CM | POA: Diagnosis not present

## 2022-07-04 DIAGNOSIS — R7989 Other specified abnormal findings of blood chemistry: Secondary | ICD-10-CM

## 2022-07-04 DIAGNOSIS — K219 Gastro-esophageal reflux disease without esophagitis: Secondary | ICD-10-CM

## 2022-07-04 DIAGNOSIS — J301 Allergic rhinitis due to pollen: Secondary | ICD-10-CM

## 2022-07-04 MED ORDER — PANTOPRAZOLE SODIUM 40 MG PO TBEC: DELAYED_RELEASE_TABLET | ORAL | 5 refills | Status: DC

## 2022-07-04 MED ORDER — FLUTICASONE PROPIONATE 50 MCG/ACT NA SUSP: NASAL | 5 refills | Status: DC

## 2022-07-04 MED ORDER — TESTOSTERONE 20.25 MG/ACT (1.62%) TD GEL: 1.0000 | Freq: Every day | TRANSDERMAL | 5 refills | Status: DC

## 2022-07-04 MED ORDER — ATORVASTATIN CALCIUM 10 MG PO TABS: ORAL_TABLET | ORAL | 5 refills | Status: DC

## 2022-07-04 MED ORDER — MELOXICAM 7.5 MG PO TABS: ORAL_TABLET | ORAL | 5 refills | Status: AC

## 2022-07-04 MED ORDER — MONTELUKAST SODIUM 10 MG PO TABS: ORAL_TABLET | ORAL | 5 refills | Status: DC

## 2022-07-04 MED ORDER — TADALAFIL 20 MG PO TABS: ORAL_TABLET | ORAL | 5 refills | Status: DC

## 2022-07-04 MED ORDER — LISINOPRIL 40 MG PO TABS: ORAL_TABLET | ORAL | 5 refills | Status: DC

## 2022-07-04 NOTE — Progress Notes (Signed)
Date:  07/04/2022   Name:  Tyler Glover   DOB:  19-Sep-1960   MRN:  [id number]   Chief Complaint: Allergic Rhinitis , Hyperlipidemia, Hypertension, Gastroesophageal Reflux, and Arthritis  Hyperlipidemia This is a chronic problem. The current episode started more than 1 year ago. The problem is controlled. Recent lipid tests were reviewed and are normal. He has no history of chronic renal disease. Pertinent negatives include no chest pain, focal sensory loss, focal weakness, myalgias or shortness of breath. Current antihyperlipidemic treatment includes statins. The current treatment provides moderate improvement of lipids.  Hypertension This is a chronic problem. The current episode started more than 1 year ago. The problem has been gradually improving since onset. The problem is controlled. Pertinent negatives include no blurred vision, chest pain, headaches, neck pain, orthopnea, palpitations, peripheral edema, PND or shortness of breath. There are no associated agents to hypertension. Past treatments include ACE inhibitors. The current treatment provides moderate improvement. There is no history of CAD/MI or CVA. There is no history of chronic renal disease, a hypertension causing med or renovascular disease.  Gastroesophageal Reflux He reports no abdominal pain, no chest pain, no coughing, no heartburn, no hoarse voice, no nausea, no sore throat or no wheezing. This is a chronic problem. The problem occurs rarely. The problem has been gradually improving. He has tried a PPI for the symptoms.  Arthritis Presents for follow-up visit. He reports no pain, stiffness or joint swelling. The symptoms have been stable. Affected location: hands/back. Pertinent negatives include no diarrhea, dysuria, fever or rash.    Lab Results  Component Value Date   NA 144 05/19/2021   K 4.4 05/19/2021   CO2 21 05/19/2021   GLUCOSE 88 05/19/2021   BUN 16 05/19/2021   CREATININE 0.86 05/19/2021   CALCIUM  9.7 05/19/2021   EGFR 100 05/19/2021   GFRNONAA 73 06/30/2019   Lab Results  Component Value Date   CHOL 127 05/19/2021   HDL 51 05/19/2021   LDLCALC 61 05/19/2021   TRIG 73 05/19/2021   CHOLHDL 4.6 08/22/2017   No results found for: "TSH" Lab Results  Component Value Date   HGBA1C 5.5 05/19/2021   No results found for: "WBC", "HGB", "HCT", "MCV", "PLT" Lab Results  Component Value Date   ALT 16 05/19/2021   AST 21 05/19/2021   ALKPHOS 83 05/19/2021   BILITOT 0.5 05/19/2021   Lab Results  Component Value Date   VD25OH 30.1 07/07/2015     Review of Systems  Constitutional:  Negative for chills and fever.  HENT:  Negative for drooling, ear discharge, ear pain, hoarse voice and sore throat.   Eyes:  Negative for blurred vision.  Respiratory:  Negative for cough, shortness of breath and wheezing.   Cardiovascular:  Negative for chest pain, palpitations, orthopnea, leg swelling and PND.  Gastrointestinal:  Negative for abdominal pain, blood in stool, constipation, diarrhea, heartburn and nausea.  Endocrine: Negative for polydipsia and polyuria.  Genitourinary:  Negative for difficulty urinating, dysuria, frequency, hematuria and urgency.  Musculoskeletal:  Positive for arthritis. Negative for back pain, joint swelling, myalgias, neck pain and stiffness.  Skin:  Negative for rash.  Allergic/Immunologic: Negative for environmental allergies.  Neurological:  Negative for dizziness, focal weakness and headaches.  Hematological:  Does not bruise/bleed easily.  Psychiatric/Behavioral:  Negative for suicidal ideas. The patient is not nervous/anxious.     Patient Active Problem List   Diagnosis Date Noted   Encounter for screening colonoscopy  Polyp of transverse colon    Heartburn    Pure hypercholesterolemia 01/26/2016   Right lateral epicondylitis 07/07/2015   Vitamin D deficiency 12/24/2014   Prediabetes 12/24/2014   Allergic rhinitis, seasonal 06/03/2014   Acid  reflux 06/03/2014   Essential (primary) hypertension 06/03/2014   Family history of diabetes mellitus 06/03/2014   Family history of cardiac disorder 06/03/2014   Obesity (BMI 30.0-34.9) 06/03/2014    Allergies  Allergen Reactions   Penicillins     Told as child not to do injectable, but has taken Amoxicillin since Unknown    Past Surgical History:  Procedure Laterality Date   COLONOSCOPY     COLONOSCOPY WITH PROPOFOL N/A 08/21/2019   Procedure: COLONOSCOPY WITH PROPOFOL;  Surgeon: Midge Minium, MD;  Location: [facility];  Service: Endoscopy;  Laterality: N/A;  priority 4   ESOPHAGOGASTRODUODENOSCOPY (EGD) WITH PROPOFOL N/A 08/21/2019   Procedure: ESOPHAGOGASTRODUODENOSCOPY (EGD) WITH PROPOFOL;  Surgeon: Midge Minium, MD;  Location: [facility];  Service: Endoscopy;  Laterality: N/A;   POLYPECTOMY  08/21/2019   Procedure: POLYPECTOMY;  Surgeon: Midge Minium, MD;  Location: [facility];  Service: Endoscopy;;   SHOULDER SURGERY     TESTICLE SURGERY     age 62 or 62   TONSILLECTOMY      Social History   Tobacco Use   Smoking status: Former    Types: Cigarettes    Quit date: 1993    Years since quitting: 31.4   Smokeless tobacco: Never  Vaping Use   Vaping Use: Never used  Substance Use Topics   Alcohol use: Not Currently    Alcohol/week: 0.0 standard drinks of alcohol   Drug use: No     Medication list has been reviewed and updated.  Current Meds  Medication Sig   acetaminophen (TYLENOL) 325 MG tablet Take by mouth.   Amantadine HCl 100 MG tablet Take 50 mg by mouth 2 (two) times daily. Sherryll Burger   atorvastatin (LIPITOR) 10 MG tablet TAKE 1 TABLET(10 MG) BY MOUTH DAILY   bismuth subsalicylate (PEPTO BISMOL) 262 MG/15ML suspension Take 30 mLs by mouth every 6 (six) hours as needed for indigestion.   calcium carbonate (OS-CAL) 600 MG tablet Take 600 mg by mouth daily.   Cholecalciferol (VITAMIN D) 2000 units CAPS Take 1 capsule (2,000 Units total) by  mouth daily.   Cyanocobalamin 1000 MCG CAPS Take 1 capsule by mouth daily.   fluticasone (FLONASE) 50 MCG/ACT nasal spray USE 1 SPRAY IN EACH NOSTRIL DAILY   folic acid (FOLVITE) 1 MG tablet Take 1 tablet by mouth daily. patel   hydroxychloroquine (PLAQUENIL) 200 MG tablet Take 1 tablet by mouth 2 (two) times daily. Dr patel   ibuprofen (ADVIL) 200 MG tablet Take 1 tablet by mouth as needed.   lisinopril (ZESTRIL) 40 MG tablet TAKE 1 TABLET(40 MG) BY MOUTH DAILY   meloxicam (MOBIC) 7.5 MG tablet TAKE 1 TABLET(7.5 MG) BY MOUTH DAILY   methotrexate (RHEUMATREX) 2.5 MG tablet Take 7 tablets by mouth once a week. patel   montelukast (SINGULAIR) 10 MG tablet TAKE 1 TABLET(10 MG) BY MOUTH AT BEDTIME   Multiple Vitamin (MULTIVITAMIN) capsule Take 1 capsule by mouth daily.   OVER THE COUNTER MEDICATION daily. Total Brain Dietary Supplement   pantoprazole (PROTONIX) 40 MG tablet TAKE 1 TABLET(40 MG) BY MOUTH DAILY   pseudoephedrine (WAL-PHED 12 HOUR) 120 MG 12 hr tablet TAKE 1 TABLET BY MOUTH EVERY 12 HOURS AS NEEDED FOR CONGESTION   tadalafil (CIALIS) 20  MG tablet TAKE  1/2-1 TABLET BY MOUTH EVERY OTHER DAY AS NEEDED FOR ERECTILE DYSFUNCTION   Testosterone (ANDROGEL PUMP) 20.25 MG/ACT (1.62%) GEL Place 1 Pump onto the skin daily. Apply 1 pump to the forearm daily   Turmeric (RA TURMERIC EXTRA STRENGTH) 1053 MG TABS Take by mouth.   vitamin E 180 MG (400 UNITS) capsule Take 400 Units by mouth daily.       07/04/2022   10:04 AM 01/03/2022   11:25 AM 08/28/2021   11:42 AM 09/05/2020    8:36 AM  GAD 7 : Generalized Anxiety Score  Nervous, Anxious, on Edge 0 0 0 0  Control/stop worrying 0 0 0 0  Worry too much - different things 0 0 0 0  Trouble relaxing 0 0 0 0  Restless 0 0 0 0  Easily annoyed or irritable 0 0 0 0  Afraid - awful might happen 0 0 0 0  Total GAD 7 Score 0 0 0 0  Anxiety Difficulty Not difficult at all Not difficult at all Not difficult at all        07/04/2022   10:04 AM  01/03/2022   11:24 AM 08/28/2021   11:42 AM  Depression screen PHQ 2/9  Decreased Interest 0 0 0  Down, Depressed, Hopeless 0 0 0  PHQ - 2 Score 0 0 0  Altered sleeping 0 0 0  Tired, decreased energy 0 0 0  Change in appetite 0 0 0  Feeling bad or failure about yourself  0 0 0  Trouble concentrating 0 0 0  Moving slowly or fidgety/restless 0 0 0  Suicidal thoughts 0 0 0  PHQ-9 Score 0 0 0  Difficult doing work/chores Not difficult at all Not difficult at all Not difficult at all    BP Readings from Last 3 Encounters:  07/04/22 128/78  01/03/22 126/78  08/30/21 122/84    Physical Exam Vitals and nursing note reviewed.  HENT:     Head: Normocephalic.     Right Ear: Tympanic membrane and external ear normal.     Left Ear: Tympanic membrane and external ear normal.     Nose: Nose normal.     Mouth/Throat:     Mouth: Mucous membranes are moist.  Eyes:     General: No scleral icterus.       Right eye: No discharge.        Left eye: No discharge.     Conjunctiva/sclera: Conjunctivae normal.     Pupils: Pupils are equal, round, and reactive to light.  Neck:     Thyroid: No thyromegaly.     Vascular: No JVD.     Trachea: No tracheal deviation.  Cardiovascular:     Rate and Rhythm: Normal rate and regular rhythm.     Heart sounds: Normal heart sounds, S1 normal and S2 normal. No murmur heard.    No systolic murmur is present.     No diastolic murmur is present.     No friction rub. No gallop. No S3 or S4 sounds.  Pulmonary:     Effort: No respiratory distress.     Breath sounds: Normal breath sounds. No decreased breath sounds, wheezing, rhonchi or rales.  Abdominal:     General: Bowel sounds are normal.     Palpations: Abdomen is soft. There is no hepatomegaly, splenomegaly or mass.     Tenderness: There is no abdominal tenderness. There is no guarding or rebound.  Musculoskeletal:  General: No tenderness. Normal range of motion.     Cervical back: Normal range  of motion and neck supple.     Right lower leg: No edema.     Left lower leg: No edema.  Lymphadenopathy:     Cervical: No cervical adenopathy.  Skin:    General: Skin is warm.     Findings: No rash.  Neurological:     Mental Status: He is alert and oriented to person, place, and time.     Cranial Nerves: No cranial nerve deficit.     Deep Tendon Reflexes: Reflexes are normal and symmetric.     Wt Readings from Last 3 Encounters:  07/04/22 213 lb (96.6 kg)  01/03/22 207 lb (93.9 kg)  08/30/21 212 lb (96.2 kg)    BP 128/78   Pulse 72   Ht 5\' 10"  (1.778 m)   Wt 213 lb (96.6 kg)   SpO2 100%   BMI 30.56 kg/m   Assessment and Plan:  1. Essential (primary) hypertension Chronic.  Controlled.  Stable.  Continue lisinopril 40 mg once a day.  Will check CMP for electrolytes and GFR.  Will recheck patient in 6 months. - atorvastatin (LIPITOR) 10 MG tablet; TAKE 1 TABLET(10 MG) BY MOUTH DAILY  Dispense: 30 tablet; Refill: 5 - lisinopril (ZESTRIL) 40 MG tablet; TAKE 1 TABLET(40 MG) BY MOUTH DAILY  Dispense: 30 tablet; Refill: 5 - Comprehensive Metabolic Panel (CMET)  2. Hypercholesterolemia Chronic.  Controlled.  Stable.  Continue atorvastatin 10 mg once a day.  Will check lipid panel for current level of control. - atorvastatin (LIPITOR) 10 MG tablet; TAKE 1 TABLET(10 MG) BY MOUTH DAILY  Dispense: 30 tablet; Refill: 5 - Lipid Panel With LDL/HDL Ratio  3. Arthritis of temporomandibular joint, unspecified laterality Chronic.  Controlled.  Stable.  Primarily arthritis of the hands and lower back.  Controlled on meloxicam 7.5 mg daily. - meloxicam (MOBIC) 7.5 MG tablet; TAKE 1 TABLET(7.5 MG) BY MOUTH DAILY  Dispense: 30 tablet; Refill: 5  4. Eustachian tube dysfunction, bilateral Episodic.  Particularly during allergy season.  Continue Singulair 10 mg daily. - montelukast (SINGULAIR) 10 MG tablet; TAKE 1 TABLET(10 MG) BY MOUTH AT BEDTIME  Dispense: 30 tablet; Refill: 5  5.  Gastroesophageal reflux disease without esophagitis Chronic.  Controlled.  Stable.  Continue pantoprazole 40 mg once a day. - pantoprazole (PROTONIX) 40 MG tablet; TAKE 1 TABLET(40 MG) BY MOUTH DAILY  Dispense: 30 tablet; Refill: 5  6. Seasonal allergic rhinitis due to pollen Chronic.  Controlled.  Stable.  Continue montelukast and Flonase. - fluticasone (FLONASE) 50 MCG/ACT nasal spray; USE 1 SPRAY IN EACH NOSTRIL DAILY  Dispense: 16 g; Refill: 5  7. Vasculogenic erectile dysfunction, unspecified vasculogenic erectile dysfunction type Chronic.  Controlled.  Stable.  Patient has had an adequate relief with Cialis 20 mg as needed as needed - tadalafil (CIALIS) 20 MG tablet; TAKE  1/2-1 TABLET BY MOUTH EVERY OTHER DAY AS NEEDED FOR ERECTILE DYSFUNCTION  Dispense: 20 tablet; Refill: 5  8. 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  Controlled.  Stable.  Will check PSA for current level of control. - PSA - Testosterone (ANDROGEL PUMP) 20.25 MG/ACT (1.62%) GEL; Place 1 Pump onto the skin daily. Apply 1 pump to the forearm daily  Dispense: 75 g; Refill: 5  9. Prediabetes Chronic.  Controlled.  Stable.  This is by dietary control and will check A1c for current level of control. - HgB A1c  10. Nocturia Occasional nocturia but no  other symptomatology such as decreased screen stream or inadequate emptying of the bladder.  Will check PSA for current level of control. - PSA    Elizabeth Sauer, MD

## 2022-07-05 ENCOUNTER — Encounter: Payer: 59 | Admitting: Speech Pathology

## 2022-07-05 LAB — COMPREHENSIVE METABOLIC PANEL
ALT: 13 IU/L (ref 0–44)
AST: 21 IU/L (ref 0–40)
Albumin/Globulin Ratio: 2.2 (ref 1.2–2.2)
Albumin: 4.6 g/dL (ref 3.8–4.9)
Alkaline Phosphatase: 89 IU/L (ref 44–121)
BUN/Creatinine Ratio: 17 (ref 9–20)
BUN: 17 mg/dL (ref 6–24)
Bilirubin Total: 0.6 mg/dL (ref 0.0–1.2)
CO2: 22 mmol/L (ref 20–29)
Calcium: 9.4 mg/dL (ref 8.7–10.2)
Chloride: 105 mmol/L (ref 96–106)
Creatinine, Ser: 1.03 mg/dL (ref 0.76–1.27)
Globulin, Total: 2.1 g/dL (ref 1.5–4.5)
Glucose: 83 mg/dL (ref 70–99)
Potassium: 4.5 mmol/L (ref 3.5–5.2)
Sodium: 142 mmol/L (ref 134–144)
Total Protein: 6.7 g/dL (ref 6.0–8.5)
eGFR: 84 mL/min/{1.73_m2} (ref >59)

## 2022-07-05 LAB — LIPID PANEL WITH LDL/HDL RATIO
Cholesterol, Total: 151 mg/dL (ref 100–199)
HDL: 55 mg/dL (ref >39)
LDL Chol Calc (NIH): 82 mg/dL (ref 0–99)
LDL/HDL Ratio: 1.5 ratio (ref 0.0–3.6)
Triglycerides: 71 mg/dL (ref 0–149)
VLDL Cholesterol Cal: 14 mg/dL (ref 5–40)

## 2022-07-05 LAB — PSA: Prostate Specific Ag, Serum: 1.3 ng/mL (ref 0.0–4.0)

## 2022-07-05 LAB — HEMOGLOBIN A1C
Est. average glucose Bld gHb Est-mCnc: 117 mg/dL
Hgb A1c MFr Bld: 5.7 % — ABNORMAL HIGH (ref 4.8–5.6)

## 2022-07-10 ENCOUNTER — Encounter: Payer: 59 | Admitting: Speech Pathology

## 2022-07-11 ENCOUNTER — Encounter: Payer: 59 | Admitting: Speech Pathology

## 2022-07-11 ENCOUNTER — Ambulatory Visit: Payer: 59 | Admitting: Speech Pathology

## 2022-07-11 DIAGNOSIS — R471 Dysarthria and anarthria: Secondary | ICD-10-CM | POA: Diagnosis not present

## 2022-07-11 NOTE — Therapy (Signed)
OUTPATIENT SPEECH LANGUAGE PATHOLOGY SPEECH TREATMENT   Patient Name: Tyler Glover MRN: [id number] DOB:1961/01/14, 62 y.o., male Today's Date: 07/11/2022  PCP: Duanne Limerick, MD  REFERRING PROVIDER: Lonell Face, MD    End of Session - 07/11/22 1401     Visit Number 3    Number of Visits 17    Date for SLP Re-Evaluation 09/11/22    SLP Start Time 1400    SLP Stop Time  1500    SLP Time Calculation (min) 60 min    Activity Tolerance Patient tolerated treatment well             Past Medical History:  Diagnosis Date   Allergy    Arthritis    Rheumatoid   GERD (gastroesophageal reflux disease)    Hypertension    Past Surgical History:  Procedure Laterality Date   COLONOSCOPY     COLONOSCOPY WITH PROPOFOL N/A 08/21/2019   Procedure: COLONOSCOPY WITH PROPOFOL;  Surgeon: Midge Minium, MD;  Location: [facility];  Service: Endoscopy;  Laterality: N/A;  priority 4   ESOPHAGOGASTRODUODENOSCOPY (EGD) WITH PROPOFOL N/A 08/21/2019   Procedure: ESOPHAGOGASTRODUODENOSCOPY (EGD) WITH PROPOFOL;  Surgeon: Midge Minium, MD;  Location: [facility];  Service: Endoscopy;  Laterality: N/A;   POLYPECTOMY  08/21/2019   Procedure: POLYPECTOMY;  Surgeon: Midge Minium, MD;  Location: [facility];  Service: Endoscopy;;   SHOULDER SURGERY     TESTICLE SURGERY     age 85 or 42   TONSILLECTOMY     Patient Active Problem List   Diagnosis Date Noted   Encounter for screening colonoscopy    Polyp of transverse colon    Heartburn    Pure hypercholesterolemia 01/26/2016   Right lateral epicondylitis 07/07/2015   Vitamin D deficiency 12/24/2014   Prediabetes 12/24/2014   Allergic rhinitis, seasonal 06/03/2014   Acid reflux 06/03/2014   Essential (primary) hypertension 06/03/2014   Family history of diabetes mellitus 06/03/2014   Family history of cardiac disorder 06/03/2014   Obesity (BMI 30.0-34.9) 06/03/2014    ONSET DATE: PD dx 12/01/2021, referral date  02/06/22  REFERRING DIAG: Parkinsonism  THERAPY DIAG:  Dysarthria and anarthria  Rationale for Evaluation and Treatment Rehabilitation  SUBJECTIVE:   SUBJECTIVE STATEMENT: Pt greeted SLP in WNL voice   Pt accompanied by: self  PERTINENT HISTORY: Pt is a 62 year-old male who presents for motor speech evaluation. Pt reports initial symptom onset approximately 2 years ago. Recently changed from sinemet to amantidine due to unwanted symptoms. 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 He has also gotten feedback about flat intonation in his speech. His job requires high Tour manager, teaching over noise, and workplace meetings. PMH significant for rheumatoid arthritis, HTN, reflux.   DIAGNOSTIC FINDINGS: MRI Brain 01/01/22: "normal brain MRI"  PAIN:  Are you having pain? Yes: NPRS scale: 3/10 Pain location: joints   PATIENT GOALS  "So people can hear me."  OBJECTIVE:  TODAY'S TREATMENT: Continued training in HEP for dysarthria: pt reports practicing but "less than the frequency on the paper." Sustained /a/ at gradually increasing pitches  (avg 144Hz , 173 Hz, 197 Hz, 219 Hz, 245Hz ), with avg duration 16.7 seconds, 82 dB. Glides 153-298, avg 81 dB. Pt read personally relevant phrases avg 72 dB, range 147-274 Hz. Occasional moderate cues for awareness of low vocal intensity between structured tasks (avg 65 db); reading and short responses averaged 72 dB  with occasional min-mod cues.    PATIENT EDUCATION: Education details: proposed therapy goals, sensory changes related to voice/PD Person educated: Patient Education method: Medical illustrator Education comprehension: verbalized understanding, returned demonstration, verbal cues required, and needs further education   HOME EXERCISE PROGRAM: To be provided next session  GOALS: Goals reviewed with patient? Yes  SHORT TERM GOALS: Target  date: 10 sessions  Patient will demo HEP for dysarthria accurately with min cues. Baseline: Goal status: INITIAL  2.  Patient will maintain adequate vocal quality and intensity (>70dB) in sentence level responses 90% accuracy. Baseline:  Goal status: INITIAL  3.  Patient will use dysarthria compensations (slow, loud, overpronounce, pause) in 2-3 sentence responses 90% accuracy with moderate cues. Baseline:  Goal status: INITIAL    LONG TERM GOALS: Target date: 09/11/2022  Patient will demo HEP for dysarthria independently. Baseline:  Goal status: INITIAL  2.  Patient will maintain intensity (avg >70dB) in 25 minutes conversation with modified independence Baseline:  Goal status: INITIAL  3.  Patient will use dysarthria compensations (slow, loud, overpronounce, pause) for intelligibility >90% in 25 minutes conversation. Baseline:  Goal status: INITIAL  4.  Patient will report improved satisfaction with vocal quality in the workplace than prior to ST.  Baseline:  Goal status: INITIAL  ASSESSMENT:  CLINICAL IMPRESSION: Patient presents with mild hypokinetic dysarthria characterized by reduced vocal intensity, reduced breath support, reduced pitch variability, intermittent rapid rate, and intermittent hoarse vocal quality. Patient reports noting others leaning in to hear him. He is an Producer, television/film/video and has received feedback from his students that his voice is flat/monotone. Patient responds well to audio feedback and increases vocal intensity with occasional min-mod cues today. See today's treatment note for additional details. Will utilize intensity-based approach 2x per week for 8 weeks. I recommend skilled ST to improve vocal quality, endurance, and intensity for improved intelligibility and engagement of his students, and to meet other vocal demands of work and home.   OBJECTIVE IMPAIRMENTS include dysarthria. These impairments are limiting patient from effectively  communicating at home and in community. Factors affecting potential to achieve goals and functional outcome are  scheduling; pt having difficulty coordinating LSVT schedule with work . Patient will benefit from skilled SLP services to address above impairments and improve overall function.  REHAB POTENTIAL: Excellent  PLAN: SLP FREQUENCY: 2x/week  SLP DURATION: 8 weeks *may delay start date due to scheduling conflicts; may need to change to 2x per week for 8 weeks   PLANNED INTERVENTIONS: Functional tasks, SLP instruction and feedback, Patient/family education, and Re-evaluation    Rondel Baton, MS, Corporate investment banker Pathologist [phone]    [facility] [address] [place], [place], [address] Phone: [phone]   Fax:  [phone]

## 2022-07-12 ENCOUNTER — Encounter: Payer: 59 | Admitting: Speech Pathology

## 2022-07-12 ENCOUNTER — Ambulatory Visit: Payer: 59

## 2022-07-12 DIAGNOSIS — R471 Dysarthria and anarthria: Secondary | ICD-10-CM | POA: Diagnosis not present

## 2022-07-12 DIAGNOSIS — R2681 Unsteadiness on feet: Secondary | ICD-10-CM

## 2022-07-12 DIAGNOSIS — M6281 Muscle weakness (generalized): Secondary | ICD-10-CM

## 2022-07-12 DIAGNOSIS — R262 Difficulty in walking, not elsewhere classified: Secondary | ICD-10-CM

## 2022-07-12 DIAGNOSIS — R278 Other lack of coordination: Secondary | ICD-10-CM

## 2022-07-12 NOTE — Therapy (Signed)
OUTPATIENT PHYSICAL THERAPY NEURO EVALUATION   Patient Name: Tyler Glover MRN: [id number] DOB:Jan 10, 1961, 62 y.o., male Today's Date: 07/12/2022   PCP:    Elias Else   REFERRING PROVIDER:   Lonell Face, MD    END OF SESSION:  PT End of Session - 07/12/22 1153     Visit Number 1    Number of Visits 25    Date for PT Re-Evaluation 10/04/22    PT Start Time 1020    PT Stop Time 1101    PT Time Calculation (min) 41 min    Equipment Utilized During Treatment Gait belt    Activity Tolerance Patient tolerated treatment well    Behavior During Therapy WFL for tasks assessed/performed             Past Medical History:  Diagnosis Date   Allergy    Arthritis    Rheumatoid   GERD (gastroesophageal reflux disease)    Hypertension    Past Surgical History:  Procedure Laterality Date   COLONOSCOPY     COLONOSCOPY WITH PROPOFOL N/A 08/21/2019   Procedure: COLONOSCOPY WITH PROPOFOL;  Surgeon: Midge Minium, MD;  Location: [facility];  Service: Endoscopy;  Laterality: N/A;  priority 4   ESOPHAGOGASTRODUODENOSCOPY (EGD) WITH PROPOFOL N/A 08/21/2019   Procedure: ESOPHAGOGASTRODUODENOSCOPY (EGD) WITH PROPOFOL;  Surgeon: Midge Minium, MD;  Location: [facility];  Service: Endoscopy;  Laterality: N/A;   POLYPECTOMY  08/21/2019   Procedure: POLYPECTOMY;  Surgeon: Midge Minium, MD;  Location: [facility];  Service: Endoscopy;;   SHOULDER SURGERY     TESTICLE SURGERY     age 73 or 68   TONSILLECTOMY     Patient Active Problem List   Diagnosis Date Noted   Encounter for screening colonoscopy    Polyp of transverse colon    Heartburn    Pure hypercholesterolemia 01/26/2016   Right lateral epicondylitis 07/07/2015   Vitamin D deficiency 12/24/2014   Prediabetes 12/24/2014   Allergic rhinitis, seasonal 06/03/2014   Acid reflux 06/03/2014   Essential (primary) hypertension 06/03/2014   Family history of diabetes mellitus 06/03/2014   Family  history of cardiac disorder 06/03/2014   Obesity (BMI 30.0-34.9) 06/03/2014    ONSET DATE: 02/13/2020  REFERRING DIAG: G20.C (ICD-10-CM) - Parkinsonism   THERAPY DIAG:  Difficulty in walking, not elsewhere classified  Other lack of coordination  Muscle weakness (generalized)  Unsteadiness on feet  Rationale for Evaluation and Treatment: Rehabilitation  SUBJECTIVE:  SUBJECTIVE STATEMENT: Pt known to PT. Pt was previously in PT for LSVT BIG. Pt now returns with concerns of impairments related Parkinsonism. Pt accompanied by: self  PERTINENT HISTORY:   Pt is a pleasant 62 yo male here for impairments related to Parkinsonism. The pt was diagnosed approx 2 years ago. He returns after absence following completion of LSVT BIG and is currently in speech for Parkinsonism-related impairments. He reports since completing LSVT BIG his movements have improved overall but he has experienced some decline since discharge. Pt reports he is having the most difficulty throughout L side, particularly L arm and ankle. He is also concerned about decreased strength and gait abnormality.  PMH significant for rheumatoid arthritis, HTN, hx of shoulder surgery  PAIN:  Are you having pain? Yes: NPRS scale: 3.5/10 Pain location: LBP Pain description: across low back Aggravating factors: sharp Relieving factors: laying down  PRECAUTIONS: Fall  WEIGHT BEARING RESTRICTIONS: No  FALLS: Has patient fallen in last 6 months? No  LIVING ENVIRONMENT: Lives with: lives with their family and lives with their spouse Lives in: House/apartment Stairs: Yes: Internal: >10 steps; not reported   PLOF: Independent  PATIENT GOALS:   Strengthening my legs and my arms, improving walking, particularly arm-swing, getting out of the car  more quickly after I have been driving for a while  OBJECTIVE:   DIAGNOSTIC FINDINGS:   Via chart 12/29/2021:  "MPRESSION: 1. Normal brain MRI. 2. NeuroQuant volumetric analysis of the brain, see details on [employer].     Electronically Signed   By: Tiburcio Pea M.D.   On: 01/01/2022 05:55"  COGNITION: Overall cognitive status: Within functional limits for tasks assessed   SENSATION: WFL In tact to light touch BLE   COORDINATION: Impaired primarily L side  EDEMA:  None observed or reported     POSTURE: forward head and increased thoracic kyphosis  LOWER EXTREMITY MMT:    LE strength grossly 4+/5 bilaterally, most deficits found LLE    TRANSFERS: Assistive device utilized: None  Sit to stand: Complete Independence however with decreased speed (see 5xSTS)   STAIRS: Level of Assistance: Modified independence Stair Negotiation Technique: Alternating Pattern  with Bilateral Rails Number of Stairs: 5-6  Pt reports more steps at home, can be tiring   GAIT: Gait pattern:  Decreased arm-swing bilat, however, L more severe than R and absent at times. Pt also with decreased heel strike LLE, at times exhibits festination with fatigue Distance walked: see for total distance Assistive device utilized: None Level of assistance: SBA   FUNCTIONAL TESTS:  5 times sit to stand: 13 seconds hands-free 6 minute walk test: 1241 ft and fatiguing. Worsening of gait mechanics with fatigue  10 meter walk test: 1.4 m/s  Berg Balance Scale: 49/56 Dynamic Gait Index: 19/24  Don and doff both shoes (sneakers): 53 seconds   TODAY'S TREATMENT:  DATE: eval only    PATIENT EDUCATION: Education details: exam findings, indications, goals, plan Person educated: Patient Education method: Explanation Education comprehension: verbalized  understanding  HOME EXERCISE PROGRAM: To be initiated next session. Pt instructed to continue LSVT BIG exercises he was previously performing from prior PT until new HEP can be established  GOALS: Goals reviewed with patient? Yes   SHORT TERM GOALS: Target date: 08/23/2022    Patient will be independent in home exercise program to improve strength/mobility for better functional independence with ADLs. Baseline: to be initiated  Goal status: INITIAL   LONG TERM GOALS: Target date: 10/04/2022   Patient will increase FOTO score to equal to or greater than     to demonstrate statistically significant improvement in mobility and quality of life.  Baseline:  Goal status: INITIAL  2.  Patient (< 61 years old) will complete five times sit to stand test in < 10 seconds indicating an increased LE strength and improved balance. Baseline: 13 seconds  Goal status: INITIAL  3.  Patient will increase Berg Balance score by > 6 points to demonstrate decreased fall risk during functional activities Baseline: 49/56 Goal status: INITIAL   4.  Patient will increase dynamic gait index score to >20/24 as to demonstrate reduced fall risk and improved dynamic gait balance for better safety with community/home ambulation.   Baseline: 19/24 Goal status: INITIAL  5.Patient will increase six minute walk test distance to >1300 for improve gait ability and endurance with community activities.  Baseline: 1241 ft and fatiguing. Worsening of gait mechanics with fatigue  Goal status: INITIAL  6. Patient will maintain L arm swing for at least 3 minutes without cuing in order to demonstrate improved ability to override hypokinetic movement.  Baseline: absent or significantly decreased arm-swing observed 100% of time during 3 minute, continuous walking period Goal status: INITIAL  7.  Pt will improve ability to doff/don both shoes while seated by at least 10 seconds in order to demonstrate improved  functional mobility of BUE and increased ease in dressing. Baseline: 53 seconds Goal status: INITIAL      ASSESSMENT:  CLINICAL IMPRESSION: Patient is a pleasant 62 y.o. male who was seen today for physical therapy evaluation and treatment for impairments related to Parkinsonism. Examination reveals deficits in strength, coordination, gait mechanics/gait ability, pain and balance. Pt [place] also currently limited by hypokinetic movement which is affecting his overall ability to complete ADLs. The pt will benefit from further skilled PT to improve strength, gait, balance, and mobility.    OBJECTIVE IMPAIRMENTS: Abnormal gait, decreased activity tolerance, decreased balance, decreased coordination, decreased endurance, decreased mobility, difficulty walking, decreased strength, impaired UE functional use, improper body mechanics, postural dysfunction, and pain.   ACTIVITY LIMITATIONS: squatting, stairs, bed mobility, dressing, and locomotion level  PARTICIPATION LIMITATIONS: cleaning, community activity, occupation, and yard work  PERSONAL FACTORS: Age, Time since onset of injury/illness/exacerbation, and 3+ comorbidities: PMH significant for rheumatoid arthritis, HTN, hx of shoulder surgery  are also affecting patient's functional outcome.   REHAB POTENTIAL: Good  CLINICAL DECISION MAKING: Evolving/moderate complexity  EVALUATION COMPLEXITY: Moderate  PLAN:  PT FREQUENCY: 2x/week  PT DURATION: 12 weeks  PLANNED INTERVENTIONS: Therapeutic exercises, Therapeutic activity, Neuromuscular re-education, Balance training, Gait training, Patient/Family education, Self Care, Joint mobilization, Stair training, Vestibular training, Canalith repositioning, Visual/preceptual remediation/compensation, Orthotic/Fit training, DME instructions, Electrical stimulation, Wheelchair mobility training, Spinal mobilization, Cryotherapy, Moist heat, Taping, Ultrasound, Manual therapy, and Re-evaluation  PLAN  FOR NEXT SESSION: set up HEP, Parkinson's  specific exercises    Baird Kay, PT 07/12/2022, 12:52 PM

## 2022-07-16 ENCOUNTER — Encounter: Payer: 59 | Admitting: Speech Pathology

## 2022-07-19 ENCOUNTER — Other Ambulatory Visit: Payer: Self-pay | Admitting: Family Medicine

## 2022-07-19 DIAGNOSIS — I10 Essential (primary) hypertension: Secondary | ICD-10-CM

## 2022-07-19 NOTE — Telephone Encounter (Signed)
Unable to refill per protocol, Rx request is too soon. Last refill 07/04/22 for 30 and 5 refills.  Requested Prescriptions  Pending Prescriptions Disp Refills   lisinopril (ZESTRIL) 40 MG tablet [Pharmacy Med Name: LISINOPRIL 40MG  TABLETS] 90 tablet     Sig: TAKE 1 TABLET(40 MG) BY MOUTH DAILY     Cardiovascular:  ACE Inhibitors Passed - 07/19/2022 10:14 AM      Passed - Cr in normal range and within 180 days    Creatinine, Ser  Date Value Ref Range Status  07/04/2022 1.03 0.76 - 1.27 mg/dL Final         Passed - K in normal range and within 180 days    Potassium  Date Value Ref Range Status  07/04/2022 4.5 3.5 - 5.2 mmol/L Final         Passed - Patient is not pregnant      Passed - Last BP in normal range    BP Readings from Last 1 Encounters:  07/04/22 128/78         Passed - Valid encounter within last 6 months    Recent Outpatient Visits           2 weeks ago Essential (primary) hypertension   [facility]   6 months ago Essential (primary) hypertension   [facility]   10 months ago COVID   [facility]   10 months ago Acute non-recurrent sinusitis, unspecified location   [facility]   1 year ago Annual physical exam   [facility]       Future Appointments             In 6 months Duanne Limerick, MD [facility] at [facility], [facility]

## 2022-07-23 ENCOUNTER — Ambulatory Visit: Payer: 59 | Attending: Neurology | Admitting: Speech Pathology

## 2022-07-23 ENCOUNTER — Ambulatory Visit: Payer: 59

## 2022-07-23 ENCOUNTER — Encounter: Payer: 59 | Admitting: Speech Pathology

## 2022-07-23 DIAGNOSIS — R278 Other lack of coordination: Secondary | ICD-10-CM | POA: Diagnosis present

## 2022-07-23 DIAGNOSIS — R2681 Unsteadiness on feet: Secondary | ICD-10-CM | POA: Insufficient documentation

## 2022-07-23 DIAGNOSIS — R262 Difficulty in walking, not elsewhere classified: Secondary | ICD-10-CM

## 2022-07-23 DIAGNOSIS — R471 Dysarthria and anarthria: Secondary | ICD-10-CM | POA: Insufficient documentation

## 2022-07-23 DIAGNOSIS — M6281 Muscle weakness (generalized): Secondary | ICD-10-CM | POA: Diagnosis present

## 2022-07-23 NOTE — Therapy (Signed)
OUTPATIENT PHYSICAL THERAPY NEURO TREATMENT NOTE   Patient Name: Tyler Glover MRN: [id number] DOB:17-Sep-1960, 62 y.o., male Today's Date: 07/24/2022   PCP:    Elias Else   REFERRING PROVIDER:   Lonell Face, MD    END OF SESSION:  PT End of Session - 07/24/22 1611     Visit Number 2    Number of Visits 25    Date for PT Re-Evaluation 10/04/22    PT Start Time 1018    PT Stop Time 1100    PT Time Calculation (min) 42 min    Equipment Utilized During Treatment Gait belt    Activity Tolerance Patient tolerated treatment well    Behavior During Therapy WFL for tasks assessed/performed             Past Medical History:  Diagnosis Date   Allergy    Arthritis    Rheumatoid   GERD (gastroesophageal reflux disease)    Hypertension    Past Surgical History:  Procedure Laterality Date   COLONOSCOPY     COLONOSCOPY WITH PROPOFOL N/A 08/21/2019   Procedure: COLONOSCOPY WITH PROPOFOL;  Surgeon: Midge Minium, MD;  Location: [facility];  Service: Endoscopy;  Laterality: N/A;  priority 4   ESOPHAGOGASTRODUODENOSCOPY (EGD) WITH PROPOFOL N/A 08/21/2019   Procedure: ESOPHAGOGASTRODUODENOSCOPY (EGD) WITH PROPOFOL;  Surgeon: Midge Minium, MD;  Location: [facility];  Service: Endoscopy;  Laterality: N/A;   POLYPECTOMY  08/21/2019   Procedure: POLYPECTOMY;  Surgeon: Midge Minium, MD;  Location: [facility];  Service: Endoscopy;;   SHOULDER SURGERY     TESTICLE SURGERY     age 11 or 29   TONSILLECTOMY     Patient Active Problem List   Diagnosis Date Noted   Encounter for screening colonoscopy    Polyp of transverse colon    Heartburn    Pure hypercholesterolemia 01/26/2016   Right lateral epicondylitis 07/07/2015   Vitamin D deficiency 12/24/2014   Prediabetes 12/24/2014   Allergic rhinitis, seasonal 06/03/2014   Acid reflux 06/03/2014   Essential (primary) hypertension 06/03/2014   Family history of diabetes mellitus 06/03/2014    Family history of cardiac disorder 06/03/2014   Obesity (BMI 30.0-34.9) 06/03/2014    ONSET DATE: 02/13/2020  REFERRING DIAG: G20.C (ICD-10-CM) - Parkinsonism   THERAPY DIAG:  Other lack of coordination  Difficulty in walking, not elsewhere classified  Rationale for Evaluation and Treatment: Rehabilitation  SUBJECTIVE:  SUBJECTIVE STATEMENT: Pt returns from beach vacation. He reports he feels he was generally moving somewhat slow. Pt accompanied by: self  PERTINENT HISTORY:   Pt is a pleasant 62 yo male here for impairments related to Parkinsonism. The pt was diagnosed approx 2 years ago. He returns after absence following completion of LSVT BIG and is currently in speech for Parkinsonism-related impairments. He reports since completing LSVT BIG his movements have improved overall but he has experienced some decline since discharge. Pt reports he is having the most difficulty throughout L side, particularly L arm and ankle. He is also concerned about decreased strength and gait abnormality.  PMH significant for rheumatoid arthritis, HTN, hx of shoulder surgery  PAIN:  Are you having pain? Yes: NPRS scale: 3.5/10 Pain location: LBP Pain description: across low back Aggravating factors: sharp Relieving factors: laying down  PRECAUTIONS: Fall  WEIGHT BEARING RESTRICTIONS: No  FALLS: Has patient fallen in last 6 months? No  LIVING ENVIRONMENT: Lives with: lives with their family and lives with their spouse Lives in: House/apartment Stairs: Yes: Internal: >10 steps; not reported   PLOF: Independent  PATIENT GOALS:   Strengthening my legs and my arms, improving walking, particularly arm-swing, getting out of the car more quickly after I have been driving for a while  OBJECTIVE:    DIAGNOSTIC FINDINGS:   Via chart 12/29/2021:  "MPRESSION: 1. Normal brain MRI. 2. NeuroQuant volumetric analysis of the brain, see details on [employer].     Electronically Signed   By: Tiburcio Pea M.D.   On: 01/01/2022 05:55"  COGNITION: Overall cognitive status: Within functional limits for tasks assessed   SENSATION: WFL In tact to light touch BLE   COORDINATION: Impaired primarily L side  EDEMA:  None observed or reported     POSTURE: forward head and increased thoracic kyphosis  LOWER EXTREMITY MMT:    LE strength grossly 4+/5 bilaterally, most deficits found LLE    TRANSFERS: Assistive device utilized: None  Sit to stand: Complete Independence however with decreased speed (see 5xSTS)   STAIRS: Level of Assistance: Modified independence Stair Negotiation Technique: Alternating Pattern  with Bilateral Rails Number of Stairs: 5-6  Pt reports more steps at home, can be tiring   GAIT: Gait pattern:  Decreased arm-swing bilat, however, L more severe than R and absent at times. Pt also with decreased heel strike LLE, at times exhibits festination with fatigue Distance walked: see for total distance Assistive device utilized: None Level of assistance: SBA   FUNCTIONAL TESTS:  5 times sit to stand: 13 seconds hands-free 6 minute walk test: 1241 ft and fatiguing. Worsening of gait mechanics with fatigue  10 meter walk test: 1.4 m/s  Berg Balance Scale: 49/56 Dynamic Gait Index: 19/24  Don and doff both shoes (sneakers): 53 seconds   TODAY'S TREATMENT:  DATE:    NMR: Large amplitude arm-swing  with PVC in each hand and cuing for upright posture and increased step length 3x148 ft --then repeats 2x148 ft without PVC with goal of carryover in technique  Large-amplitude STS with BUE abduction and upright posture 1x6,   --repeats with 1.5# weights on wrists 2x5  Don and doffing shoes using large-amplitude movement and finger flicks (5x). Pt with addition of 1.5# weights on BUE  Trial 1: 60 seconds Trial 2: 50 seconds Trial 3: 47 seconds  Trial 4: 45 seconds  Trial 5: 42 seconds    1.5# on each UE: Standing BUE arm-swing, emphasis on getting more L shoulder ext 10x, then for 2x30 sec. Cuing for upright posture   Standing rock and reach with alt UE arm-swing and 1.5# UE weights 10x for each. Difficulty with coordination, maintaining arm-swing and sequencing.  Fatigues.  --Pt then repeats for another 20 reps in each LE position   Step-length exercise: cues to take larger steps, performed over 10 meter track Trial 1- 17 steps Trial 2- 15 steps Trial 3- 14 steps Trial 4- 12 steps  SLB 2x30 sec each LE   ISSUED HEP, see below  PATIENT EDUCATION: Education details: Pt educated throughout session about proper posture and technique with exercises. Improved exercise technique, movement at target joints, use of target muscles after min to mod verbal, visual, tactile cues. HEP Person educated: Patient Education method: Explanation, Demonstration, Tactile cues, Verbal cues, and Handouts Education comprehension: verbalized understanding, returned demonstration, verbal cues required, tactile cues required, and needs further education    HOME EXERCISE PROGRAM: 07/23/22: issued as printout: Standing and swinging your arms (alternating) -focus on swinging left arm behind you.  Perform this 6 days/week 3 sets of 20 reps. Sit to stand with arms out. Perform 5-6 days/week for 3 sets of 10 reps.   GOALS: Goals reviewed with patient? Yes   SHORT TERM GOALS: Target date: 08/23/2022    Patient will be independent in home exercise program to improve strength/mobility for better functional independence with ADLs. Baseline: to be initiated  Goal status: INITIAL   LONG TERM GOALS: Target date:  10/04/2022   Patient will increase FOTO score to equal to or greater than     to demonstrate statistically significant improvement in mobility and quality of life.  Baseline:  Goal status: INITIAL  2.  Patient (< 95 years old) will complete five times sit to stand test in < 10 seconds indicating an increased LE strength and improved balance. Baseline: 13 seconds  Goal status: INITIAL  3.  Patient will increase Berg Balance score by > 6 points to demonstrate decreased fall risk during functional activities Baseline: 49/56 Goal status: INITIAL   4.  Patient will increase dynamic gait index score to >20/24 as to demonstrate reduced fall risk and improved dynamic gait balance for better safety with community/home ambulation.   Baseline: 19/24 Goal status: INITIAL  5.Patient will increase six minute walk test distance to >1300 for improve gait ability and endurance with community activities.  Baseline: 1241 ft and fatiguing. Worsening of gait mechanics with fatigue  Goal status: INITIAL  6. Patient will maintain L arm swing for at least 3 minutes without cuing in order to demonstrate improved ability to override hypokinetic movement.  Baseline: absent or significantly decreased arm-swing observed 100% of time during 3 minute, continuous walking period Goal status: INITIAL  7.  Pt will improve ability to doff/don both shoes while seated by at least  10 seconds in order to demonstrate improved functional mobility of BUE and increased ease in dressing. Baseline: 53 seconds Goal status: INITIAL      ASSESSMENT:  CLINICAL IMPRESSION: Initiated PD specific interventions with focus on targeting L arm-swing. Pt currently exhibits difficulty sustaining large-amplitude L arm swing with majority of activities, including gait.  HEP initiated to address this deficit. The pt will benefit from further skilled PT to improve strength, gait, balance, and mobility.    OBJECTIVE IMPAIRMENTS: Abnormal  gait, decreased activity tolerance, decreased balance, decreased coordination, decreased endurance, decreased mobility, difficulty walking, decreased strength, impaired UE functional use, improper body mechanics, postural dysfunction, and pain.   ACTIVITY LIMITATIONS: squatting, stairs, bed mobility, dressing, and locomotion level  PARTICIPATION LIMITATIONS: cleaning, community activity, occupation, and yard work  PERSONAL FACTORS: Age, Time since onset of injury/illness/exacerbation, and 3+ comorbidities: PMH significant for rheumatoid arthritis, HTN, hx of shoulder surgery  are also affecting patient's functional outcome.   REHAB POTENTIAL: Good  CLINICAL DECISION MAKING: Evolving/moderate complexity  EVALUATION COMPLEXITY: Moderate  PLAN:  PT FREQUENCY: 2x/week  PT DURATION: 12 weeks  PLANNED INTERVENTIONS: Therapeutic exercises, Therapeutic activity, Neuromuscular re-education, Balance training, Gait training, Patient/Family education, Self Care, Joint mobilization, Stair training, Vestibular training, Canalith repositioning, Visual/preceptual remediation/compensation, Orthotic/Fit training, DME instructions, Electrical stimulation, Wheelchair mobility training, Spinal mobilization, Cryotherapy, Moist heat, Taping, Ultrasound, Manual therapy, and Re-evaluation  PLAN FOR NEXT SESSION: Parkinson's specific exercises    Baird Kay, PT 07/24/2022, 4:15 PM

## 2022-07-23 NOTE — Therapy (Signed)
OUTPATIENT SPEECH LANGUAGE PATHOLOGY SPEECH TREATMENT   Patient Name: Tyler Glover MRN: [id number] DOB:27-Nov-1960, 62 y.o., male Today's Date: 07/23/2022  PCP: Duanne Limerick, MD  REFERRING PROVIDER: Lonell Face, MD    End of Session - 07/23/22 1208     Visit Number 4    Number of Visits 17    Date for SLP Re-Evaluation 09/11/22    SLP Start Time 1100    SLP Stop Time  1150    SLP Time Calculation (min) 50 min    Activity Tolerance Patient tolerated treatment well             Past Medical History:  Diagnosis Date   Allergy    Arthritis    Rheumatoid   GERD (gastroesophageal reflux disease)    Hypertension    Past Surgical History:  Procedure Laterality Date   COLONOSCOPY     COLONOSCOPY WITH PROPOFOL N/A 08/21/2019   Procedure: COLONOSCOPY WITH PROPOFOL;  Surgeon: Midge Minium, MD;  Location: [facility];  Service: Endoscopy;  Laterality: N/A;  priority 4   ESOPHAGOGASTRODUODENOSCOPY (EGD) WITH PROPOFOL N/A 08/21/2019   Procedure: ESOPHAGOGASTRODUODENOSCOPY (EGD) WITH PROPOFOL;  Surgeon: Midge Minium, MD;  Location: [facility];  Service: Endoscopy;  Laterality: N/A;   POLYPECTOMY  08/21/2019   Procedure: POLYPECTOMY;  Surgeon: Midge Minium, MD;  Location: [facility];  Service: Endoscopy;;   SHOULDER SURGERY     TESTICLE SURGERY     age 12 or 48   TONSILLECTOMY     Patient Active Problem List   Diagnosis Date Noted   Encounter for screening colonoscopy    Polyp of transverse colon    Heartburn    Pure hypercholesterolemia 01/26/2016   Right lateral epicondylitis 07/07/2015   Vitamin D deficiency 12/24/2014   Prediabetes 12/24/2014   Allergic rhinitis, seasonal 06/03/2014   Acid reflux 06/03/2014   Essential (primary) hypertension 06/03/2014   Family history of diabetes mellitus 06/03/2014   Family history of cardiac disorder 06/03/2014   Obesity (BMI 30.0-34.9) 06/03/2014    ONSET DATE: PD dx 12/01/2021, referral date  02/06/22  REFERRING DIAG: Parkinsonism  THERAPY DIAG:  Dysarthria and anarthria  Rationale for Evaluation and Treatment Rehabilitation  SUBJECTIVE:   SUBJECTIVE STATEMENT: Pt greeted SLP in WNL voice   Pt accompanied by: self  PERTINENT HISTORY: Pt is a 62 year-old male who presents for motor speech evaluation. Pt reports initial symptom onset approximately 2 years ago. Recently changed from sinemet to amantidine due to unwanted symptoms. 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 He has also gotten feedback about flat intonation in his speech. His job requires high Tour manager, teaching over noise, and workplace meetings. PMH significant for rheumatoid arthritis, HTN, reflux.   DIAGNOSTIC FINDINGS: MRI Brain 01/01/22: "normal brain MRI"  PAIN:  Are you having pain? No.   PATIENT GOALS  "So people can hear me."  OBJECTIVE:  TODAY'S TREATMENT: Continued training in HEP for dysarthria: pt not following HEP while on vacation last week, but will "make an effort" to complete HEP this week. Sustained /a/ at gradually increasing pitches  (avg 145 Hz, 170 Hz, 187 Hz, 193 Hz, 217 Hz), with avg duration 16.8 seconds, 83 dB. Glides 140-294, avg 85 dB. Pt read personally relevant phrases avg 78 dB, range 147-274 Hz. Paragraph level reading targeting with emphasis on vocal intensity 59-71 dB prior to cues and 75-84 dB with  education and cueing for use of phrasing to promote breath support and vocal loudness. Pt given task of bringing slides from class lecture for functional treatment stimuli next session. Pt completed PROM as below:  Communicative Participation Item Bank (CPIB) Age Range: 18+   The Communicative Participation Item Bank (CPIB) is a patient-reported outcomes instrument that targets the construct of communicative participation [employer] et al., 2013). The CPIB was developed with the intent of being  appropriate for community-dwelling adults in the variety of conversational situations they frequently engage in as part of life roles at home, at work, and in social and leisure situations. All items in the CPIB start with the stem, 'Does your condition interfere with..' followed by various conversational situations such as 'Making a phone call to get information,' or 'Having a conversation in a noisy place.' Respondents choose from four response categories to rate the level of interference they experience in that situation, ranging from 'Not at all,' to 'Very much.' The item bank consists of 46 items, and a paper-and-pencil 10-item disorder-generic short form is available. The CPIB scores are reported as T-scores where 50 = the mean of the calibration sample and standard deviation = 10.  Does your condition interfere with talking with people you know?  A LITTLE (value=2) Does your condition interfere with communicating when you need to say something quickly?  NOT AT ALL (value=3) Does your condition interfere with talking with people you do NOT know?  A LITTLE (value=2) Does your condition interfere with communicating when you are out in the community (e.g., running errands, appointments)?  A LITTLE (value=2) Does your condition interfere with asking questions in a conversation?  A LITTLE (value=2) Does your condition interfere with communicating in a small group of people? QUITE A BIT (value=1) Does your condition interfere with having a long conversation with someone you know about a book, movie, show or sports event?  A LITTLE (value=2) Does your condition interfere with giving someone DETAILED information?  NOT AT ALL (value=3) Does your condition interfere with getting your turn in a fast-moving conversation?  A LITTLE (value=2) Does your condition interfere with trying to persuade a friend or family member to see a different point of view?  NOT AT ALL (value=3)  Pt's T-Score is 55.3 which is  considered average, SD +/-10 with mean of 50.     PATIENT EDUCATION: Education details: proposed therapy goals, sensory changes related to voice/PD Person educated: Patient Education method: Medical illustrator Education comprehension: verbalized understanding, returned demonstration, verbal cues required, and needs further education   HOME EXERCISE PROGRAM: To be provided next session  GOALS: Goals reviewed with patient? Yes  SHORT TERM GOALS: Target date: 10 sessions  Patient will demo HEP for dysarthria accurately with min cues. Baseline: Goal status: INITIAL  2.  Patient will maintain adequate vocal quality and intensity (>70dB) in sentence level responses 90% accuracy. Baseline:  Goal status: INITIAL  3.  Patient will use dysarthria compensations (slow, loud, overpronounce, pause) in 2-3 sentence responses 90% accuracy with moderate cues. Baseline:  Goal status: INITIAL    LONG TERM GOALS: Target date: 09/11/2022  Patient will demo HEP for dysarthria independently. Baseline:  Goal status: INITIAL  2.  Patient will maintain intensity (avg >70dB) in 25 minutes conversation with modified independence Baseline:  Goal status: INITIAL  3.  Patient will use dysarthria compensations (slow, loud, overpronounce, pause) for intelligibility >90% in 25 minutes conversation. Baseline:  Goal status: INITIAL  4.  Patient will report improved  satisfaction with vocal quality in the workplace than prior to ST.  Baseline:  Goal status: INITIAL  ASSESSMENT:  CLINICAL IMPRESSION: Patient presents with mild hypokinetic dysarthria characterized by reduced vocal intensity, reduced breath support, reduced pitch variability, intermittent rapid rate, and intermittent hoarse vocal quality. Patient reports noting others leaning in to hear him. He is an Producer, television/film/video and has received feedback from his students that his voice is flat/monotone. Patient responds well to audio  feedback and increases vocal intensity with occasional min-mod cues today. See today's treatment note for additional details. Will utilize intensity-based approach 2x per week for 8 weeks. I recommend skilled ST to improve vocal quality, endurance, and intensity for improved intelligibility and engagement of his students, and to meet other vocal demands of work and home.   OBJECTIVE IMPAIRMENTS include dysarthria. These impairments are limiting patient from effectively communicating at home and in community. Factors affecting potential to achieve goals and functional outcome are  scheduling; pt having difficulty coordinating LSVT schedule with work . Patient will benefit from skilled SLP services to address above impairments and improve overall function.  REHAB POTENTIAL: Excellent  PLAN: SLP FREQUENCY: 2x/week  SLP DURATION: 8 weeks *may delay start date due to scheduling conflicts; may need to change to 2x per week for 8 weeks   PLANNED INTERVENTIONS: Functional tasks, SLP instruction and feedback, Patient/family education, and Re-evaluation   Clyde Canterbury, M.S., CCC-SLP Speech-Language Pathologist [facility] [phone] (ASCOM)   [facility] [address] [place], [place], [address] Phone: [phone]   Fax:  [phone]

## 2022-07-24 ENCOUNTER — Encounter: Payer: 59 | Admitting: Speech Pathology

## 2022-07-24 ENCOUNTER — Ambulatory Visit: Payer: 59

## 2022-07-24 ENCOUNTER — Other Ambulatory Visit: Payer: Self-pay

## 2022-07-24 DIAGNOSIS — I10 Essential (primary) hypertension: Secondary | ICD-10-CM

## 2022-07-24 MED ORDER — LISINOPRIL 40 MG PO TABS: ORAL_TABLET | ORAL | 1 refills | Status: DC

## 2022-07-25 ENCOUNTER — Encounter: Payer: 59 | Admitting: Speech Pathology

## 2022-07-26 ENCOUNTER — Ambulatory Visit: Payer: 59

## 2022-07-26 DIAGNOSIS — M6281 Muscle weakness (generalized): Secondary | ICD-10-CM

## 2022-07-26 DIAGNOSIS — R471 Dysarthria and anarthria: Secondary | ICD-10-CM | POA: Diagnosis not present

## 2022-07-26 DIAGNOSIS — R278 Other lack of coordination: Secondary | ICD-10-CM

## 2022-07-26 DIAGNOSIS — R262 Difficulty in walking, not elsewhere classified: Secondary | ICD-10-CM

## 2022-07-26 NOTE — Therapy (Signed)
OUTPATIENT PHYSICAL THERAPY NEURO TREATMENT NOTE   Patient Name: Tyler Glover MRN: [id number] DOB:[birth year], 62 y.o., male Today's Date: 07/26/2022   PCP:    Elias Else   REFERRING PROVIDER:   Lonell Face, MD    END OF SESSION:  PT End of Session - 07/26/22 0848     Visit Number 3    Number of Visits 25    Date for PT Re-Evaluation 10/04/22    PT Start Time 0847    PT Stop Time 0928    PT Time Calculation (min) 41 min    Equipment Utilized During Treatment Gait belt    Activity Tolerance Patient tolerated treatment well    Behavior During Therapy WFL for tasks assessed/performed             Past Medical History:  Diagnosis Date   Allergy    Arthritis    Rheumatoid   GERD (gastroesophageal reflux disease)    Hypertension    Past Surgical History:  Procedure Laterality Date   COLONOSCOPY     COLONOSCOPY WITH PROPOFOL N/A 08/21/2019   Procedure: COLONOSCOPY WITH PROPOFOL;  Surgeon: Midge Minium, MD;  Location: [facility];  Service: Endoscopy;  Laterality: N/A;  priority 4   ESOPHAGOGASTRODUODENOSCOPY (EGD) WITH PROPOFOL N/A 08/21/2019   Procedure: ESOPHAGOGASTRODUODENOSCOPY (EGD) WITH PROPOFOL;  Surgeon: Midge Minium, MD;  Location: [facility];  Service: Endoscopy;  Laterality: N/A;   POLYPECTOMY  08/21/2019   Procedure: POLYPECTOMY;  Surgeon: Midge Minium, MD;  Location: [facility];  Service: Endoscopy;;   SHOULDER SURGERY     TESTICLE SURGERY     age 48 or 49   TONSILLECTOMY     Patient Active Problem List   Diagnosis Date Noted   Encounter for screening colonoscopy    Polyp of transverse colon    Heartburn    Pure hypercholesterolemia 01/26/2016   Right lateral epicondylitis 07/07/2015   Vitamin D deficiency 12/24/2014   Prediabetes 12/24/2014   Allergic rhinitis, seasonal 06/03/2014   Acid reflux 06/03/2014   Essential (primary) hypertension 06/03/2014   Family history of diabetes mellitus 06/03/2014    Family history of cardiac disorder 06/03/2014   Obesity (BMI 30.0-34.9) 06/03/2014    ONSET DATE: 02/13/2020  REFERRING DIAG: G20.C (ICD-10-CM) - Parkinsonism   THERAPY DIAG:  Other lack of coordination  Difficulty in walking, not elsewhere classified  Muscle weakness (generalized)  Rationale for Evaluation and Treatment: Rehabilitation  SUBJECTIVE:  SUBJECTIVE STATEMENT: Pt reports his HEP is going OK. Pt states, "I feel like I have a little bit of disconnect" when trying to perform some of the movements. Pt reports he is more aware of his balance at work when he has to walk around obstacles in his garage and it is more challenging. Pt accompanied by: self  PERTINENT HISTORY:   Pt is a pleasant 62 yo male here for impairments related to Parkinsonism. The pt was diagnosed approx 2 years ago. He returns after absence following completion of LSVT BIG and is currently in speech for Parkinsonism-related impairments. He reports since completing LSVT BIG his movements have improved overall but he has experienced some decline since discharge. Pt reports he is having the most difficulty throughout L side, particularly L arm and ankle. He is also concerned about decreased strength and gait abnormality.  PMH significant for rheumatoid arthritis, HTN, hx of shoulder surgery  PAIN:  Are you having pain? Yes: NPRS scale: 3.5/10 Pain location: LBP Pain description: across low back Aggravating factors: sharp Relieving factors: laying down  PRECAUTIONS: Fall  WEIGHT BEARING RESTRICTIONS: No  FALLS: Has patient fallen in last 6 months? No  LIVING ENVIRONMENT: Lives with: lives with their family and lives with their spouse Lives in: House/apartment Stairs: Yes: Internal: >10 steps; not reported   PLOF:  Independent  PATIENT GOALS:   Strengthening my legs and my arms, improving walking, particularly arm-swing, getting out of the car more quickly after I have been driving for a while  OBJECTIVE:   DIAGNOSTIC FINDINGS:   Via chart 12/29/2021:  "MPRESSION: 1. Normal brain MRI. 2. NeuroQuant volumetric analysis of the brain, see details on [employer].     Electronically Signed   By: Tiburcio Pea M.D.   On: 01/01/2022 05:55"  COGNITION: Overall cognitive status: Within functional limits for tasks assessed   SENSATION: WFL In tact to light touch BLE   COORDINATION: Impaired primarily L side  EDEMA:  None observed or reported     POSTURE: forward head and increased thoracic kyphosis  LOWER EXTREMITY MMT:    LE strength grossly 4+/5 bilaterally, most deficits found LLE    TRANSFERS: Assistive device utilized: None  Sit to stand: Complete Independence however with decreased speed (see 5xSTS)   STAIRS: Level of Assistance: Modified independence Stair Negotiation Technique: Alternating Pattern  with Bilateral Rails Number of Stairs: 5-6  Pt reports more steps at home, can be tiring   GAIT: Gait pattern:  Decreased arm-swing bilat, however, L more severe than R and absent at times. Pt also with decreased heel strike LLE, at times exhibits festination with fatigue Distance walked: see for total distance Assistive device utilized: None Level of assistance: SBA   FUNCTIONAL TESTS:  5 times sit to stand: 13 seconds hands-free 6 minute walk test: 1241 ft and fatiguing. Worsening of gait mechanics with fatigue  10 meter walk test: 1.4 m/s  Berg Balance Scale: 49/56 Dynamic Gait Index: 19/24  Don and doff both shoes (sneakers): 53 seconds   TODAY'S TREATMENT:  DATE:    NMR: Large amplitude arm-swing with PVC in each hand and  cuing for upright posture and increased step-length 2x148 ft, then without PVC cuing 2x148 ft, then progressed to additional focus on heel-toe sequencing with and without PVC cue 4x148 ft.  Don and doffing shoes using large-amplitude movement and finger flicks (5x). Trial 1: 50 seconds Trial 2: 47 seconds Trial 3:  40 seconds  Trial 4: 55 seconds  Practiced additional finger flicks 2x10, large leg lift movement 4x  Trial 5: 49 seconds   2# weights on wrists: -Large-amplitude STS with BUE abduction and upright posture 2x10. Cuing for upright posture , finger abduction. Pt rates intervention as medium -Large amplitude arm-swing  with PVC in each hand and cuing for upright posture and increased step length 2x148 ft, then without PVC cuing 2x148 ft, additional focus on large-steps and heel-toe sequencing -Seated trunk twist with UE reach 2x10 each side   Rolling and side-lye>seated>stand from mat table- instruction and review in large-amplitude movement with rolling and side-lye>seated>stand from bed x multiple reps each way  Standing WBOS BUE abduction with trunk twist 20x each way   Step-length exercise: cues to take larger steps, performed over 10 meter track Trial 1- 15 steps Trial 2- 12 steps Trial 3- 12 steps Trial 4- 11 steps  PATIENT EDUCATION: Education details: Pt educated throughout session about proper posture and technique with exercises. Improved exercise technique, movement at target joints, use of target muscles after min to mod verbal, visual, tactile cues.  Person educated: Patient Education method: Explanation, Demonstration, Tactile cues, and Verbal cues Education comprehension: verbalized understanding, returned demonstration, verbal cues required, tactile cues required, and needs further education    HOME EXERCISE PROGRAM: 07/23/22: issued as printout: Standing and swinging your arms (alternating) -focus on swinging left arm behind you.  Perform this 6 days/week 3  sets of 20 reps. Sit to stand with arms out. Perform 5-6 days/week for 3 sets of 10 reps.   GOALS: Goals reviewed with patient? Yes   SHORT TERM GOALS: Target date: 08/23/2022    Patient will be independent in home exercise program to improve strength/mobility for better functional independence with ADLs. Baseline: to be initiated  Goal status: INITIAL   LONG TERM GOALS: Target date: 10/04/2022   Patient will increase FOTO score to equal to or greater than     to demonstrate statistically significant improvement in mobility and quality of life.  Baseline:  Goal status: INITIAL  2.  Patient (< 64 years old) will complete five times sit to stand test in < 10 seconds indicating an increased LE strength and improved balance. Baseline: 13 seconds  Goal status: INITIAL  3.  Patient will increase Berg Balance score by > 6 points to demonstrate decreased fall risk during functional activities Baseline: 49/56 Goal status: INITIAL   4.  Patient will increase dynamic gait index score to >20/24 as to demonstrate reduced fall risk and improved dynamic gait balance for better safety with community/home ambulation.   Baseline: 19/24 Goal status: INITIAL  5.Patient will increase six minute walk test distance to >1300 for improve gait ability and endurance with community activities.  Baseline: 1241 ft and fatiguing. Worsening of gait mechanics with fatigue  Goal status: INITIAL  6. Patient will maintain L arm swing for at least 3 minutes without cuing in order to demonstrate improved ability to override hypokinetic movement.  Baseline: absent or significantly decreased arm-swing observed 100% of time during 3 minute, continuous walking  period Goal status: INITIAL  7.  Pt will improve ability to doff/don both shoes while seated by at least 10 seconds in order to demonstrate improved functional mobility of BUE and increased ease in dressing. Baseline: 53 seconds Goal status: INITIAL       ASSESSMENT:  CLINICAL IMPRESSION: Pt shows improvement with step-length intervention and shoe donning activity, improving performance on both. While pt shows progress, he struggles with sustaining large-amplitude LUE arm-swing with increased LLE step-length and heel-toe sequencing. The pt will benefit from further skilled PT to improve strength, gait, balance, and mobility.    OBJECTIVE IMPAIRMENTS: Abnormal gait, decreased activity tolerance, decreased balance, decreased coordination, decreased endurance, decreased mobility, difficulty walking, decreased strength, impaired UE functional use, improper body mechanics, postural dysfunction, and pain.   ACTIVITY LIMITATIONS: squatting, stairs, bed mobility, dressing, and locomotion level  PARTICIPATION LIMITATIONS: cleaning, community activity, occupation, and yard work  PERSONAL FACTORS: Age, Time since onset of injury/illness/exacerbation, and 3+ comorbidities: PMH significant for rheumatoid arthritis, HTN, hx of shoulder surgery  are also affecting patient's functional outcome.   REHAB POTENTIAL: Good  CLINICAL DECISION MAKING: Evolving/moderate complexity  EVALUATION COMPLEXITY: Moderate  PLAN:  PT FREQUENCY: 2x/week  PT DURATION: 12 weeks  PLANNED INTERVENTIONS: Therapeutic exercises, Therapeutic activity, Neuromuscular re-education, Balance training, Gait training, Patient/Family education, Self Care, Joint mobilization, Stair training, Vestibular training, Canalith repositioning, Visual/preceptual remediation/compensation, Orthotic/Fit training, DME instructions, Electrical stimulation, Wheelchair mobility training, Spinal mobilization, Cryotherapy, Moist heat, Taping, Ultrasound, Manual therapy, and Re-evaluation  PLAN FOR NEXT SESSION: Parkinson's specific exercises, SLB, rock and reach exercises, continue plan   Baird Kay, PT 07/26/2022, 9:34 AM

## 2022-07-31 ENCOUNTER — Ambulatory Visit: Payer: 59

## 2022-07-31 ENCOUNTER — Ambulatory Visit: Payer: 59 | Admitting: Speech Pathology

## 2022-07-31 DIAGNOSIS — R471 Dysarthria and anarthria: Secondary | ICD-10-CM | POA: Diagnosis not present

## 2022-07-31 DIAGNOSIS — R262 Difficulty in walking, not elsewhere classified: Secondary | ICD-10-CM

## 2022-07-31 DIAGNOSIS — R278 Other lack of coordination: Secondary | ICD-10-CM

## 2022-07-31 NOTE — Therapy (Signed)
OUTPATIENT SPEECH LANGUAGE PATHOLOGY SPEECH TREATMENT   Patient Name: Tyler Glover MRN: [id number] DOB:May 13, 1960, 62 y.o., male Today's Date: 07/31/2022  PCP: Duanne Limerick, MD  REFERRING PROVIDER: Lonell Face, MD    End of Session - 07/31/22 1104     SLP Start Time 1000    SLP Stop Time  1055    SLP Time Calculation (min) 55 min             Past Medical History:  Diagnosis Date   Allergy    Arthritis    Rheumatoid   GERD (gastroesophageal reflux disease)    Hypertension    Past Surgical History:  Procedure Laterality Date   COLONOSCOPY     COLONOSCOPY WITH PROPOFOL N/A 08/21/2019   Procedure: COLONOSCOPY WITH PROPOFOL;  Surgeon: Midge Minium, MD;  Location: [facility];  Service: Endoscopy;  Laterality: N/A;  priority 4   ESOPHAGOGASTRODUODENOSCOPY (EGD) WITH PROPOFOL N/A 08/21/2019   Procedure: ESOPHAGOGASTRODUODENOSCOPY (EGD) WITH PROPOFOL;  Surgeon: Midge Minium, MD;  Location: [facility];  Service: Endoscopy;  Laterality: N/A;   POLYPECTOMY  08/21/2019   Procedure: POLYPECTOMY;  Surgeon: Midge Minium, MD;  Location: [facility];  Service: Endoscopy;;   SHOULDER SURGERY     TESTICLE SURGERY     age 8 or 59   TONSILLECTOMY     Patient Active Problem List   Diagnosis Date Noted   Encounter for screening colonoscopy    Polyp of transverse colon    Heartburn    Pure hypercholesterolemia 01/26/2016   Right lateral epicondylitis 07/07/2015   Vitamin D deficiency 12/24/2014   Prediabetes 12/24/2014   Allergic rhinitis, seasonal 06/03/2014   Acid reflux 06/03/2014   Essential (primary) hypertension 06/03/2014   Family history of diabetes mellitus 06/03/2014   Family history of cardiac disorder 06/03/2014   Obesity (BMI 30.0-34.9) 06/03/2014    ONSET DATE: PD dx 12/01/2021, referral date 02/06/22  REFERRING DIAG: Parkinsonism  THERAPY DIAG:  Dysarthria and anarthria  Rationale for Evaluation and Treatment  Rehabilitation  SUBJECTIVE:   SUBJECTIVE STATEMENT: Pt greeted SLP in WNL voice. Stated that son noticed "improvement" in pt's speech since last year. Pt pleased with progress to date.    Pt accompanied by: self  PERTINENT HISTORY: Pt is a 62 year-old male who presents for motor speech evaluation. Pt reports initial symptom onset approximately 2 years ago. Recently changed from sinemet to amantidine due to unwanted symptoms. 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 He has also gotten feedback about flat intonation in his speech. His job requires high Tour manager, teaching over noise, and workplace meetings. PMH significant for rheumatoid arthritis, HTN, reflux.   DIAGNOSTIC FINDINGS: MRI Brain 01/01/22: "normal brain MRI"  PAIN:  Are you having pain? No.   PATIENT GOALS  "So people can hear me."  OBJECTIVE:  TODAY'S TREATMENT: Pt reports limited use of HEP this week, but endorses use of strategies while out in public as well as conversation with family. Pt asked for copy of HEP for further practice. SLP provided. Continued training in HEP for dysarthria: pt not following HEP while on vacation last week, but will "make an effort" to complete HEP this week. Sustained /a/ at gradually increasing pitches  (avg 145 Hz, 170 Hz, 187 Hz, 209 Hz, 244 Hz), with avg duration 17.2 seconds, 83 dB. Glides 140-294, avg 85 dB. Pt read personally relevant phrases avg 78  dB, range 147-270 Hz. Pt able to use phrasing strategy with moderate assistance with personally relevant material (slides from classroom lecture) to maintain >70 dB for ~5 minutes.    PATIENT EDUCATION: Education details: communication strategies Person educated: Patient Education method: Medical illustrator Education comprehension: verbalized understanding, returned demonstration, verbal cues required, and needs further education   HOME  EXERCISE PROGRAM: To be provided next session  GOALS: Goals reviewed with patient? Yes  SHORT TERM GOALS: Target date: 10 sessions  Patient will demo HEP for dysarthria accurately with min cues. Baseline: Goal status: INITIAL  2.  Patient will maintain adequate vocal quality and intensity (>70dB) in sentence level responses 90% accuracy. Baseline:  Goal status: INITIAL  3.  Patient will use dysarthria compensations (slow, loud, overpronounce, pause) in 2-3 sentence responses 90% accuracy with moderate cues. Baseline:  Goal status: INITIAL    LONG TERM GOALS: Target date: 09/11/2022  Patient will demo HEP for dysarthria independently. Baseline:  Goal status: INITIAL  2.  Patient will maintain intensity (avg >70dB) in 25 minutes conversation with modified independence Baseline:  Goal status: INITIAL  3.  Patient will use dysarthria compensations (slow, loud, overpronounce, pause) for intelligibility >90% in 25 minutes conversation. Baseline:  Goal status: INITIAL  4.  Patient will report improved satisfaction with vocal quality in the workplace than prior to ST.  Baseline:  Goal status: INITIAL  ASSESSMENT:  CLINICAL IMPRESSION: Patient presents with mild hypokinetic dysarthria characterized by reduced vocal intensity, reduced breath support, reduced pitch variability, intermittent rapid rate, and intermittent hoarse vocal quality. Patient reports noting others leaning in to hear him. He is an Producer, television/film/video and has received feedback from his students that his voice is flat/monotone. Patient responds well to audio feedback and increases vocal intensity with occasional min-mod cues today. See today's treatment note for additional details. Will utilize intensity-based approach 2x per week for 8 weeks. I recommend skilled ST to improve vocal quality, endurance, and intensity for improved intelligibility and engagement of his students, and to meet other vocal demands of  work and home.   OBJECTIVE IMPAIRMENTS include dysarthria. These impairments are limiting patient from effectively communicating at home and in community. Factors affecting potential to achieve goals and functional outcome are  scheduling; pt having difficulty coordinating LSVT schedule with work . Patient will benefit from skilled SLP services to address above impairments and improve overall function.  REHAB POTENTIAL: Excellent  PLAN: SLP FREQUENCY: 2x/week  SLP DURATION: 8 weeks *may delay start date due to scheduling conflicts; may need to change to 2x per week for 8 weeks   PLANNED INTERVENTIONS: Functional tasks, SLP instruction and feedback, Patient/family education, and Re-evaluation   Clyde Canterbury, M.S., CCC-SLP Speech-Language Pathologist [facility] [phone] (ASCOM)   [facility] [address] [place], [place], [address] Phone: [phone]   Fax:  [phone]

## 2022-07-31 NOTE — Therapy (Signed)
OUTPATIENT PHYSICAL THERAPY NEURO TREATMENT NOTE   Patient Name: Tyler Glover MRN: [id number] DOB:[birth year], 62 y.o., male Today's Date: 07/31/2022   PCP:    Elias Else   REFERRING PROVIDER:   Lonell Face, MD    END OF SESSION:  PT End of Session - 07/31/22 1059     Visit Number 4    Number of Visits 25    Date for PT Re-Evaluation 10/04/22    PT Start Time 1101    PT Stop Time 1143    PT Time Calculation (min) 42 min    Equipment Utilized During Treatment Gait belt    Activity Tolerance Patient tolerated treatment well    Behavior During Therapy WFL for tasks assessed/performed             Past Medical History:  Diagnosis Date   Allergy    Arthritis    Rheumatoid   GERD (gastroesophageal reflux disease)    Hypertension    Past Surgical History:  Procedure Laterality Date   COLONOSCOPY     COLONOSCOPY WITH PROPOFOL N/A 08/21/2019   Procedure: COLONOSCOPY WITH PROPOFOL;  Surgeon: Midge Minium, MD;  Location: [facility];  Service: Endoscopy;  Laterality: N/A;  priority 4   ESOPHAGOGASTRODUODENOSCOPY (EGD) WITH PROPOFOL N/A 08/21/2019   Procedure: ESOPHAGOGASTRODUODENOSCOPY (EGD) WITH PROPOFOL;  Surgeon: Midge Minium, MD;  Location: [facility];  Service: Endoscopy;  Laterality: N/A;   POLYPECTOMY  08/21/2019   Procedure: POLYPECTOMY;  Surgeon: Midge Minium, MD;  Location: [facility];  Service: Endoscopy;;   SHOULDER SURGERY     TESTICLE SURGERY     age 61 or 34   TONSILLECTOMY     Patient Active Problem List   Diagnosis Date Noted   Encounter for screening colonoscopy    Polyp of transverse colon    Heartburn    Pure hypercholesterolemia 01/26/2016   Right lateral epicondylitis 07/07/2015   Vitamin D deficiency 12/24/2014   Prediabetes 12/24/2014   Allergic rhinitis, seasonal 06/03/2014   Acid reflux 06/03/2014   Essential (primary) hypertension 06/03/2014   Family history of diabetes mellitus 06/03/2014    Family history of cardiac disorder 06/03/2014   Obesity (BMI 30.0-34.9) 06/03/2014    ONSET DATE: 02/13/2020  REFERRING DIAG: G20.C (ICD-10-CM) - Parkinsonism   THERAPY DIAG:  Other lack of coordination  Difficulty in walking, not elsewhere classified  Rationale for Evaluation and Treatment: Rehabilitation  SUBJECTIVE:  SUBJECTIVE STATEMENT: Pt doing well, no updates or current concerns. Pt accompanied by: self  PERTINENT HISTORY:   Pt is a pleasant 62 yo male here for impairments related to Parkinsonism. The pt was diagnosed approx 2 years ago. He returns after absence following completion of LSVT BIG and is currently in speech for Parkinsonism-related impairments. He reports since completing LSVT BIG his movements have improved overall but he has experienced some decline since discharge. Pt reports he is having the most difficulty throughout L side, particularly L arm and ankle. He is also concerned about decreased strength and gait abnormality.  PMH significant for rheumatoid arthritis, HTN, hx of shoulder surgery  PAIN:  Are you having pain? Yes: NPRS scale: 3.5/10 Pain location: LBP Pain description: across low back Aggravating factors: sharp Relieving factors: laying down  PRECAUTIONS: Fall  WEIGHT BEARING RESTRICTIONS: No  FALLS: Has patient fallen in last 6 months? No  LIVING ENVIRONMENT: Lives with: lives with their family and lives with their spouse Lives in: House/apartment Stairs: Yes: Internal: >10 steps; not reported   PLOF: Independent  PATIENT GOALS:   Strengthening my legs and my arms, improving walking, particularly arm-swing, getting out of the car more quickly after I have been driving for a while  OBJECTIVE:   DIAGNOSTIC FINDINGS:   Via chart 12/29/2021:   "MPRESSION: 1. Normal brain MRI. 2. NeuroQuant volumetric analysis of the brain, see details on [employer].     Electronically Signed   By: Tiburcio Pea M.D.   On: 01/01/2022 05:55"  COGNITION: Overall cognitive status: Within functional limits for tasks assessed   SENSATION: WFL In tact to light touch BLE   COORDINATION: Impaired primarily L side  EDEMA:  None observed or reported     POSTURE: forward head and increased thoracic kyphosis  LOWER EXTREMITY MMT:    LE strength grossly 4+/5 bilaterally, most deficits found LLE    TRANSFERS: Assistive device utilized: None  Sit to stand: Complete Independence however with decreased speed (see 5xSTS)   STAIRS: Level of Assistance: Modified independence Stair Negotiation Technique: Alternating Pattern  with Bilateral Rails Number of Stairs: 5-6  Pt reports more steps at home, can be tiring   GAIT: Gait pattern:  Decreased arm-swing bilat, however, L more severe than R and absent at times. Pt also with decreased heel strike LLE, at times exhibits festination with fatigue Distance walked: see for total distance Assistive device utilized: None Level of assistance: SBA   FUNCTIONAL TESTS:  5 times sit to stand: 13 seconds hands-free 6 minute walk test: 1241 ft and fatiguing. Worsening of gait mechanics with fatigue  10 meter walk test: 1.4 m/s  Berg Balance Scale: 49/56 Dynamic Gait Index: 19/24  Don and doff both shoes (sneakers): 53 seconds   TODAY'S TREATMENT:  DATE:    NMR: Ambulation outside for dynamic balance activity over different surfaces (firm and compliant, slopes, etc) with focus on upright posture, step-length and arm-swing. Cuing provided throughout for technique x 15 min  1.5# weights on wrists: -Large amplitude alt UE reach with Fwd/bckwd weight-shift 2x15 in  each LE position. Cuing throughout for technique -large amplitude forward step with bilateral shoulder abduction 20x each side  -Don and doffing shoes using large-amplitude movement and finger flicks (5x). Trial 1: 42 seconds Trial 2: 34 seconds Trial 3:  37 seconds  Trial 4: 47 seconds  Trial 5: 40 seconds  -Large-amplitude STS with BUE abduction and upright posture 2x10. Cuing for upright posture , finger abduction. Pt rates intervention as medium -Large amplitude arm-swing with PVC in each hand and cuing for upright posture and increased step-length 2x148 ft, then without PVC cuing 2x148 ft, then progressed to addition of dual cog task 1x148 ft with PVCs, and 1x148 without. Greater difficulty with dual cog task   PATIENT EDUCATION: Education details: Pt educated throughout session about proper posture and technique with exercises. Improved exercise technique, movement at target joints, use of target muscles after min to mod verbal, visual, tactile cues.  Person educated: Patient Education method: Explanation, Demonstration, Tactile cues, and Verbal cues Education comprehension: verbalized understanding, returned demonstration, verbal cues required, tactile cues required, and needs further education    HOME EXERCISE PROGRAM: 07/23/22: issued as printout: Standing and swinging your arms (alternating) -focus on swinging left arm behind you.  Perform this 6 days/week 3 sets of 20 reps. Sit to stand with arms out. Perform 5-6 days/week for 3 sets of 10 reps.   GOALS: Goals reviewed with patient? Yes   SHORT TERM GOALS: Target date: 08/23/2022    Patient will be independent in home exercise program to improve strength/mobility for better functional independence with ADLs. Baseline: to be initiated  Goal status: INITIAL   LONG TERM GOALS: Target date: 10/04/2022   Patient will increase FOTO score to equal to or greater than     to demonstrate statistically significant improvement in  mobility and quality of life.  Baseline:  Goal status: INITIAL  2.  Patient (< 50 years old) will complete five times sit to stand test in < 10 seconds indicating an increased LE strength and improved balance. Baseline: 13 seconds  Goal status: INITIAL  3.  Patient will increase Berg Balance score by > 6 points to demonstrate decreased fall risk during functional activities Baseline: 49/56 Goal status: INITIAL   4.  Patient will increase dynamic gait index score to >20/24 as to demonstrate reduced fall risk and improved dynamic gait balance for better safety with community/home ambulation.   Baseline: 19/24 Goal status: INITIAL  5.Patient will increase six minute walk test distance to >1300 for improve gait ability and endurance with community activities.  Baseline: 1241 ft and fatiguing. Worsening of gait mechanics with fatigue  Goal status: INITIAL  6. Patient will maintain L arm swing for at least 3 minutes without cuing in order to demonstrate improved ability to override hypokinetic movement.  Baseline: absent or significantly decreased arm-swing observed 100% of time during 3 minute, continuous walking period Goal status: INITIAL  7.  Pt will improve ability to doff/don both shoes while seated by at least 10 seconds in order to demonstrate improved functional mobility of BUE and increased ease in dressing. Baseline: 53 seconds Goal status: INITIAL      ASSESSMENT:  CLINICAL IMPRESSION: Pt continues to  improve speed of donning/doffing shoes, indicating increased ability to override bradykinetic movement. While pt shows progress, dual cog task activities tend to result in decreased speed and amplitude of movement. Will continue to address this deficit. The pt will benefit from further skilled PT to improve strength, gait, balance, and mobility.    OBJECTIVE IMPAIRMENTS: Abnormal gait, decreased activity tolerance, decreased balance, decreased coordination, decreased  endurance, decreased mobility, difficulty walking, decreased strength, impaired UE functional use, improper body mechanics, postural dysfunction, and pain.   ACTIVITY LIMITATIONS: squatting, stairs, bed mobility, dressing, and locomotion level  PARTICIPATION LIMITATIONS: cleaning, community activity, occupation, and yard work  PERSONAL FACTORS: Age, Time since onset of injury/illness/exacerbation, and 3+ comorbidities: PMH significant for rheumatoid arthritis, HTN, hx of shoulder surgery  are also affecting patient's functional outcome.   REHAB POTENTIAL: Good  CLINICAL DECISION MAKING: Evolving/moderate complexity  EVALUATION COMPLEXITY: Moderate  PLAN:  PT FREQUENCY: 2x/week  PT DURATION: 12 weeks  PLANNED INTERVENTIONS: Therapeutic exercises, Therapeutic activity, Neuromuscular re-education, Balance training, Gait training, Patient/Family education, Self Care, Joint mobilization, Stair training, Vestibular training, Canalith repositioning, Visual/preceptual remediation/compensation, Orthotic/Fit training, DME instructions, Electrical stimulation, Wheelchair mobility training, Spinal mobilization, Cryotherapy, Moist heat, Taping, Ultrasound, Manual therapy, and Re-evaluation  PLAN FOR NEXT SESSION: Parkinson's specific exercises, SLB, rock and reach exercises, continue plan   Baird Kay, PT 07/31/2022, 11:45 AM

## 2022-08-02 ENCOUNTER — Telehealth: Payer: Self-pay

## 2022-08-02 ENCOUNTER — Ambulatory Visit: Payer: 59

## 2022-08-02 ENCOUNTER — Ambulatory Visit: Payer: 59 | Admitting: Speech Pathology

## 2022-08-02 DIAGNOSIS — R471 Dysarthria and anarthria: Secondary | ICD-10-CM | POA: Diagnosis not present

## 2022-08-02 DIAGNOSIS — R262 Difficulty in walking, not elsewhere classified: Secondary | ICD-10-CM

## 2022-08-02 DIAGNOSIS — R2681 Unsteadiness on feet: Secondary | ICD-10-CM

## 2022-08-02 DIAGNOSIS — R7989 Other specified abnormal findings of blood chemistry: Secondary | ICD-10-CM

## 2022-08-02 DIAGNOSIS — R278 Other lack of coordination: Secondary | ICD-10-CM

## 2022-08-02 NOTE — Therapy (Signed)
OUTPATIENT PHYSICAL THERAPY NEURO TREATMENT NOTE   Patient Name: Tyler Glover MRN: [id number] DOB:1960-12-15, 62 y.o., male Today's Date: 08/02/2022   PCP:    Elias Else   REFERRING PROVIDER:   Lonell Face, MD    END OF SESSION:  PT End of Session - 08/02/22 1749     Visit Number 5    Number of Visits 25    Date for PT Re-Evaluation 10/04/22    PT Start Time 1103    PT Stop Time 1143    PT Time Calculation (min) 40 min    Equipment Utilized During Treatment Gait belt    Activity Tolerance Patient tolerated treatment well    Behavior During Therapy WFL for tasks assessed/performed             Past Medical History:  Diagnosis Date   Allergy    Arthritis    Rheumatoid   GERD (gastroesophageal reflux disease)    Hypertension    Past Surgical History:  Procedure Laterality Date   COLONOSCOPY     COLONOSCOPY WITH PROPOFOL N/A 08/21/2019   Procedure: COLONOSCOPY WITH PROPOFOL;  Surgeon: Midge Minium, MD;  Location: [facility];  Service: Endoscopy;  Laterality: N/A;  priority 4   ESOPHAGOGASTRODUODENOSCOPY (EGD) WITH PROPOFOL N/A 08/21/2019   Procedure: ESOPHAGOGASTRODUODENOSCOPY (EGD) WITH PROPOFOL;  Surgeon: Midge Minium, MD;  Location: [facility];  Service: Endoscopy;  Laterality: N/A;   POLYPECTOMY  08/21/2019   Procedure: POLYPECTOMY;  Surgeon: Midge Minium, MD;  Location: [facility];  Service: Endoscopy;;   SHOULDER SURGERY     TESTICLE SURGERY     age 75 or 28   TONSILLECTOMY     Patient Active Problem List   Diagnosis Date Noted   Encounter for screening colonoscopy    Polyp of transverse colon    Heartburn    Pure hypercholesterolemia 01/26/2016   Right lateral epicondylitis 07/07/2015   Vitamin D deficiency 12/24/2014   Prediabetes 12/24/2014   Allergic rhinitis, seasonal 06/03/2014   Acid reflux 06/03/2014   Essential (primary) hypertension 06/03/2014   Family history of diabetes mellitus 06/03/2014    Family history of cardiac disorder 06/03/2014   Obesity (BMI 30.0-34.9) 06/03/2014    ONSET DATE: 02/13/2020  REFERRING DIAG: G20.C (ICD-10-CM) - Parkinsonism   THERAPY DIAG:  Other lack of coordination  Unsteadiness on feet  Difficulty in walking, not elsewhere classified  Rationale for Evaluation and Treatment: Rehabilitation  SUBJECTIVE:  SUBJECTIVE STATEMENT: Pt reports some LBP, but no stumbles/falls. No other updates. Pt accompanied by: self  PERTINENT HISTORY:   Pt is a pleasant 62 yo male here for impairments related to Parkinsonism. The pt was diagnosed approx 2 years ago. He returns after absence following completion of LSVT BIG and is currently in speech for Parkinsonism-related impairments. He reports since completing LSVT BIG his movements have improved overall but he has experienced some decline since discharge. Pt reports he is having the most difficulty throughout L side, particularly L arm and ankle. He is also concerned about decreased strength and gait abnormality.  PMH significant for rheumatoid arthritis, HTN, hx of shoulder surgery  PAIN:  Are you having pain? Yes: NPRS scale: 3.5/10 Pain location: LBP Pain description: across low back Aggravating factors: sharp Relieving factors: laying down  PRECAUTIONS: Fall  WEIGHT BEARING RESTRICTIONS: No  FALLS: Has patient fallen in last 6 months? No  LIVING ENVIRONMENT: Lives with: lives with their family and lives with their spouse Lives in: House/apartment Stairs: Yes: Internal: >10 steps; not reported   PLOF: Independent  PATIENT GOALS:   Strengthening my legs and my arms, improving walking, particularly arm-swing, getting out of the car more quickly after I have been driving for a while  OBJECTIVE:   DIAGNOSTIC  FINDINGS:   Via chart 12/29/2021:  "MPRESSION: 1. Normal brain MRI. 2. NeuroQuant volumetric analysis of the brain, see details on [employer].     Electronically Signed   By: Tiburcio Pea M.D.   On: 01/01/2022 05:55"  COGNITION: Overall cognitive status: Within functional limits for tasks assessed   SENSATION: WFL In tact to light touch BLE   COORDINATION: Impaired primarily L side  EDEMA:  None observed or reported     POSTURE: forward head and increased thoracic kyphosis  LOWER EXTREMITY MMT:    LE strength grossly 4+/5 bilaterally, most deficits found LLE    TRANSFERS: Assistive device utilized: None  Sit to stand: Complete Independence however with decreased speed (see 5xSTS)   STAIRS: Level of Assistance: Modified independence Stair Negotiation Technique: Alternating Pattern  with Bilateral Rails Number of Stairs: 5-6  Pt reports more steps at home, can be tiring   GAIT: Gait pattern:  Decreased arm-swing bilat, however, L more severe than R and absent at times. Pt also with decreased heel strike LLE, at times exhibits festination with fatigue Distance walked: see for total distance Assistive device utilized: None Level of assistance: SBA   FUNCTIONAL TESTS:  5 times sit to stand: 13 seconds hands-free 6 minute walk test: 1241 ft and fatiguing. Worsening of gait mechanics with fatigue  10 meter walk test: 1.4 m/s  Berg Balance Scale: 49/56 Dynamic Gait Index: 19/24  Don and doff both shoes (sneakers): 53 seconds   TODAY'S TREATMENT:  DATE:   Gait belt donned and CGA provided throughout unless specified otherwise  NMR: Ambulation outside for dynamic balance activity over different surfaces (inclines/declines, turns) with focus on upright posture, step-length and arm-swing. Cuing provided throughout for technique x  15 min.  Continued focus on L arm swing and addition of dual cog task today.  2# weights on wrists: Ambulation with and without PVC for arm-swing cue while pt ambulates laps. Addition of dual cog task with 2 laps. Pt completes 4 laps (1 lap 148 ft) total. [place] of L arm swing noted with first lap without use of PVCs, then technique decreases  -Don and doffing shoes using large-amplitude movement and finger flicks (5x). Pt wearing work boots today  Trial 1:  66 seconds Trial 2: 76 seconds Trial 3:  57 seconds  Trial 4: 58 seconds  Trial 5: 57 seconds  Pt rates difficult   -Seated large amplitude twist with cross-body UE reach 2x10x each side with 5 sec hold  -Large-amplitude STS with BUE abduction and upright posture 2x12. Cuing for upright posture , finger abduction. Pt rates intervention as medium  - Standing twist with abudction on airex 10x   PATIENT EDUCATION: Education details: Pt educated throughout session about proper posture and technique with exercises. Improved exercise technique, movement at target joints, use of target muscles after min to mod verbal, visual, tactile cues.  Person educated: Patient Education method: Explanation, Demonstration, Tactile cues, and Verbal cues Education comprehension: verbalized understanding, returned demonstration, verbal cues required, tactile cues required, and needs further education    HOME EXERCISE PROGRAM: 07/23/22: issued as printout: Standing and swinging your arms (alternating) -focus on swinging left arm behind you.  Perform this 6 days/week 3 sets of 20 reps. Sit to stand with arms out. Perform 5-6 days/week for 3 sets of 10 reps.   GOALS: Goals reviewed with patient? Yes   SHORT TERM GOALS: Target date: 08/23/2022    Patient will be independent in home exercise program to improve strength/mobility for better functional independence with ADLs. Baseline: to be initiated  Goal status: INITIAL   LONG TERM GOALS: Target  date: 10/04/2022   Patient will increase FOTO score to equal to or greater than     to demonstrate statistically significant improvement in mobility and quality of life.  Baseline:  Goal status: INITIAL  2.  Patient (< 46 years old) will complete five times sit to stand test in < 10 seconds indicating an increased LE strength and improved balance. Baseline: 13 seconds  Goal status: INITIAL  3.  Patient will increase Berg Balance score by > 6 points to demonstrate decreased fall risk during functional activities Baseline: 49/56 Goal status: INITIAL   4.  Patient will increase dynamic gait index score to >20/24 as to demonstrate reduced fall risk and improved dynamic gait balance for better safety with community/home ambulation.   Baseline: 19/24 Goal status: INITIAL  5.Patient will increase six minute walk test distance to >1300 for improve gait ability and endurance with community activities.  Baseline: 1241 ft and fatiguing. Worsening of gait mechanics with fatigue  Goal status: INITIAL  6. Patient will maintain L arm swing for at least 3 minutes without cuing in order to demonstrate improved ability to override hypokinetic movement.  Baseline: absent or significantly decreased arm-swing observed 100% of time during 3 minute, continuous walking period Goal status: INITIAL  7.  Pt will improve ability to doff/don both shoes while seated by at least 10 seconds in order  to demonstrate improved functional mobility of BUE and increased ease in dressing. Baseline: 53 seconds Goal status: INITIAL      ASSESSMENT:  CLINICAL IMPRESSION: Pt shows within-session improvement in L arm-swing today following frequent cuing. This was even sustained with a dual cognitive task. While pt shows progress, he struggled with donning work boots today, exhibiting moments of freezing. The pt will benefit from further skilled PT to improve strength, gait, balance, and mobility.    OBJECTIVE  IMPAIRMENTS: Abnormal gait, decreased activity tolerance, decreased balance, decreased coordination, decreased endurance, decreased mobility, difficulty walking, decreased strength, impaired UE functional use, improper body mechanics, postural dysfunction, and pain.   ACTIVITY LIMITATIONS: squatting, stairs, bed mobility, dressing, and locomotion level  PARTICIPATION LIMITATIONS: cleaning, community activity, occupation, and yard work  PERSONAL FACTORS: Age, Time since onset of injury/illness/exacerbation, and 3+ comorbidities: PMH significant for rheumatoid arthritis, HTN, hx of shoulder surgery  are also affecting patient's functional outcome.   REHAB POTENTIAL: Good  CLINICAL DECISION MAKING: Evolving/moderate complexity  EVALUATION COMPLEXITY: Moderate  PLAN:  PT FREQUENCY: 2x/week  PT DURATION: 12 weeks  PLANNED INTERVENTIONS: Therapeutic exercises, Therapeutic activity, Neuromuscular re-education, Balance training, Gait training, Patient/Family education, Self Care, Joint mobilization, Stair training, Vestibular training, Canalith repositioning, Visual/preceptual remediation/compensation, Orthotic/Fit training, DME instructions, Electrical stimulation, Wheelchair mobility training, Spinal mobilization, Cryotherapy, Moist heat, Taping, Ultrasound, Manual therapy, and Re-evaluation  PLAN FOR NEXT SESSION: Parkinson's specific exercises, SLB, rock and reach exercises, continue plan   Baird Kay, PT 08/02/2022, 5:52 PM

## 2022-08-02 NOTE — Therapy (Signed)
OUTPATIENT SPEECH LANGUAGE PATHOLOGY SPEECH TREATMENT   Patient Name: Tyler Glover MRN: [id number] DOB:07/18/1960, 62 y.o., male Today's Date: 08/02/2022  PCP: Duanne Limerick, MD  REFERRING PROVIDER: Lonell Face, MD    End of Session - 08/02/22 1048     Visit Number 6    Number of Visits 17    Date for SLP Re-Evaluation 09/11/22             Past Medical History:  Diagnosis Date   Allergy    Arthritis    Rheumatoid   GERD (gastroesophageal reflux disease)    Hypertension    Past Surgical History:  Procedure Laterality Date   COLONOSCOPY     COLONOSCOPY WITH PROPOFOL N/A 08/21/2019   Procedure: COLONOSCOPY WITH PROPOFOL;  Surgeon: Midge Minium, MD;  Location: [facility];  Service: Endoscopy;  Laterality: N/A;  priority 4   ESOPHAGOGASTRODUODENOSCOPY (EGD) WITH PROPOFOL N/A 08/21/2019   Procedure: ESOPHAGOGASTRODUODENOSCOPY (EGD) WITH PROPOFOL;  Surgeon: Midge Minium, MD;  Location: [facility];  Service: Endoscopy;  Laterality: N/A;   POLYPECTOMY  08/21/2019   Procedure: POLYPECTOMY;  Surgeon: Midge Minium, MD;  Location: [facility];  Service: Endoscopy;;   SHOULDER SURGERY     TESTICLE SURGERY     age 45 or 80   TONSILLECTOMY     Patient Active Problem List   Diagnosis Date Noted   Encounter for screening colonoscopy    Polyp of transverse colon    Heartburn    Pure hypercholesterolemia 01/26/2016   Right lateral epicondylitis 07/07/2015   Vitamin D deficiency 12/24/2014   Prediabetes 12/24/2014   Allergic rhinitis, seasonal 06/03/2014   Acid reflux 06/03/2014   Essential (primary) hypertension 06/03/2014   Family history of diabetes mellitus 06/03/2014   Family history of cardiac disorder 06/03/2014   Obesity (BMI 30.0-34.9) 06/03/2014    ONSET DATE: PD dx 12/01/2021, referral date 02/06/22  REFERRING DIAG: Parkinsonism  THERAPY DIAG:  Dysarthria and anarthria  Rationale for Evaluation and Treatment  Rehabilitation  SUBJECTIVE:   SUBJECTIVE STATEMENT: Pt greeted SLP in WNL voice. Eager for tx. Excited for upcoming automotive competition.   Pt accompanied by: self  PERTINENT HISTORY: Pt is a 62 year-old male who presents for motor speech evaluation. Pt reports initial symptom onset approximately 2 years ago. Recently changed from sinemet to amantidine due to unwanted symptoms. 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 He has also gotten feedback about flat intonation in his speech. His job requires high Tour manager, teaching over noise, and workplace meetings. PMH significant for rheumatoid arthritis, HTN, reflux.   DIAGNOSTIC FINDINGS: MRI Brain 01/01/22: "normal brain MRI"  PAIN:  Are you having pain? No.   PATIENT GOALS  "So people can hear me."  OBJECTIVE:  TODAY'S TREATMENT: Pt performed vocal "warm up" from in HEP for dysarthria with min cueing. Pt endorse limited use of HEP outside of SLP tx sessions. Educated re: rationale for HEP/importance. Using personally relevant material (lecture slide), paragraph level reading targeted with emphasis on vocal intensity. During structured speech tasks, including warm up, pt able to maintain adequate vocal loudness >70 db ~80% of the time. Pt benefited from use of biofeedback and initial cueing for breath support, phrasing, and posture. Pt has made steady improvement in vocal intensity, and now identifies lack of "intonation" as communication barrier. Prosody targeted at the sentence and phrase level. Pt required mod cueing to  change intonation on phrases and sentences by modify rate, rhythm, and intonation. Throughout session, pt educated re: changes to speech in Parkinsonism, ways to improve vocal intensity, and ways to modify prosody. Reinforcement of content needed.   PATIENT EDUCATION: Education details: as above Person educated: Patient Education  method: Psychiatrist comprehension: verbalized understanding, returned demonstration, verbal cues required, and needs further education   HOME EXERCISE PROGRAM: To be provided next session  GOALS: Goals reviewed with patient? Yes  SHORT TERM GOALS: Target date: 10 sessions  Patient will demo HEP for dysarthria accurately with min cues. Baseline: Goal status: INITIAL  2.  Patient will maintain adequate vocal quality and intensity (>70dB) in sentence level responses 90% accuracy. Baseline:  Goal status: INITIAL  3.  Patient will use dysarthria compensations (slow, loud, overpronounce, pause) in 2-3 sentence responses 90% accuracy with moderate cues. Baseline:  Goal status: INITIAL    LONG TERM GOALS: Target date: 09/11/2022  Patient will demo HEP for dysarthria independently. Baseline:  Goal status: INITIAL  2.  Patient will maintain intensity (avg >70dB) in 25 minutes conversation with modified independence Baseline:  Goal status: INITIAL  3.  Patient will use dysarthria compensations (slow, loud, overpronounce, pause) for intelligibility >90% in 25 minutes conversation. Baseline:  Goal status: INITIAL  4.  Patient will report improved satisfaction with vocal quality in the workplace than prior to ST.  Baseline:  Goal status: INITIAL  ASSESSMENT:  CLINICAL IMPRESSION: Patient presents with mild hypokinetic dysarthria characterized by reduced vocal intensity, reduced breath support, reduced pitch variability, intermittent rapid rate, and intermittent hoarse vocal quality. Patient reports noting others leaning in to hear him. He is an Producer, television/film/video and has received feedback from his students that his voice is flat/monotone. Patient responds well to audio feedback and increases vocal intensity with occasional min-mod cues today. See today's treatment note for additional details. Will utilize intensity-based approach 2x per week for 8 weeks.  I recommend skilled ST to improve vocal quality, endurance, and intensity for improved intelligibility and engagement of his students, and to meet other vocal demands of work and home.   OBJECTIVE IMPAIRMENTS include dysarthria. These impairments are limiting patient from effectively communicating at home and in community. Factors affecting potential to achieve goals and functional outcome are  scheduling; pt having difficulty coordinating LSVT schedule with work . Patient will benefit from skilled SLP services to address above impairments and improve overall function.  REHAB POTENTIAL: Excellent  PLAN: SLP FREQUENCY: 2x/week  SLP DURATION: 8 weeks *may delay start date due to scheduling conflicts; may need to change to 2x per week for 8 weeks   PLANNED INTERVENTIONS: Functional tasks, SLP instruction and feedback, Patient/family education, and Re-evaluation   Clyde Canterbury, M.S., CCC-SLP Speech-Language Pathologist  - [facility] [phone] (ASCOM)   [facility] at [facility] [address] [place], [place], [address] Phone: [phone]   Fax:  [phone]

## 2022-08-02 NOTE — Telephone Encounter (Signed)
Pt is returning Tara's call. Please advise CB- [phone]

## 2022-08-02 NOTE — Telephone Encounter (Signed)
Called pt to come in and get testosterone level drawn d/t PA for Androgel. Case # [id number] spoke with Byrd Hesselbach- [phone]

## 2022-08-06 LAB — TESTOSTERONE,FREE AND TOTAL
Testosterone, Free: 7.7 pg/mL (ref 7.2–24.0)
Testosterone: 470 ng/dL (ref 264–916)

## 2022-08-07 ENCOUNTER — Ambulatory Visit: Payer: 59

## 2022-08-09 ENCOUNTER — Other Ambulatory Visit: Payer: Self-pay | Admitting: Family Medicine

## 2022-08-09 ENCOUNTER — Telehealth: Payer: Self-pay | Admitting: Family Medicine

## 2022-08-09 DIAGNOSIS — E7800 Pure hypercholesterolemia, unspecified: Secondary | ICD-10-CM

## 2022-08-09 DIAGNOSIS — I10 Essential (primary) hypertension: Secondary | ICD-10-CM

## 2022-08-09 NOTE — Telephone Encounter (Signed)
Copied from CRM [id number]. Topic: General - Inquiry >> Aug 09, 2022 10:33 AM Marlow Baars wrote: Reason for CRM: The patient called back stating he is out of town this week but will check on the status of the PA as soon as he gets back in town and will call to let Delice Bison know as soon as he can.

## 2022-08-10 NOTE — Telephone Encounter (Signed)
Refilled 07/04/22 # 30 with 5 refill. Too early. Requested Prescriptions  Refused Prescriptions Disp Refills   atorvastatin (LIPITOR) 10 MG tablet [Pharmacy Med Name: ATORVASTATIN 10MG  TABLETS] 30 tablet 5    Sig: TAKE 1 TABLET(10 MG) BY MOUTH DAILY     Cardiovascular:  Antilipid - Statins Failed - 08/09/2022  8:39 AM      Failed - Lipid Panel in normal range within the last 12 months    Cholesterol, Total  Date Value Ref Range Status  07/04/2022 151 100 - 199 mg/dL Final   LDL Chol Calc (NIH)  Date Value Ref Range Status  07/04/2022 82 0 - 99 mg/dL Final   HDL  Date Value Ref Range Status  07/04/2022 55 >39 mg/dL Final   Triglycerides  Date Value Ref Range Status  07/04/2022 71 0 - 149 mg/dL Final         Passed - Patient is not pregnant      Passed - Valid encounter within last 12 months    Recent Outpatient Visits           1 month ago Essential (primary) hypertension   [facility]   7 months ago Essential (primary) hypertension   [place] [facility]   11 months ago COVID   [facility]   11 months ago Acute non-recurrent sinusitis, unspecified location   [facility]   1 year ago Annual physical exam   [facility]       Future Appointments             In 5 months Duanne Limerick, MD [facility] at [facility], [facility]

## 2022-08-14 ENCOUNTER — Ambulatory Visit: Payer: 59 | Attending: Neurology

## 2022-08-14 ENCOUNTER — Encounter: Payer: 59 | Admitting: Speech Pathology

## 2022-08-14 DIAGNOSIS — R2681 Unsteadiness on feet: Secondary | ICD-10-CM | POA: Insufficient documentation

## 2022-08-14 DIAGNOSIS — R262 Difficulty in walking, not elsewhere classified: Secondary | ICD-10-CM | POA: Diagnosis present

## 2022-08-14 DIAGNOSIS — R278 Other lack of coordination: Secondary | ICD-10-CM | POA: Diagnosis present

## 2022-08-14 DIAGNOSIS — R471 Dysarthria and anarthria: Secondary | ICD-10-CM | POA: Insufficient documentation

## 2022-08-14 NOTE — Therapy (Signed)
OUTPATIENT PHYSICAL THERAPY NEURO TREATMENT NOTE   Patient Name: Tyler Glover MRN: [id number] DOB:12-Oct-1960, 62 y.o., male Today's Date: 08/14/2022   PCP:    Elias Else   REFERRING PROVIDER:   Lonell Face, MD    END OF SESSION:  PT End of Session - 08/14/22 1447     Visit Number 6    Number of Visits 25    Date for PT Re-Evaluation 10/04/22    PT Start Time 1303    PT Stop Time 1344    PT Time Calculation (min) 41 min    Equipment Utilized During Treatment Gait belt    Activity Tolerance Patient tolerated treatment well    Behavior During Therapy WFL for tasks assessed/performed              Past Medical History:  Diagnosis Date   Allergy    Arthritis    Rheumatoid   GERD (gastroesophageal reflux disease)    Hypertension    Past Surgical History:  Procedure Laterality Date   COLONOSCOPY     COLONOSCOPY WITH PROPOFOL N/A 08/21/2019   Procedure: COLONOSCOPY WITH PROPOFOL;  Surgeon: Midge Minium, MD;  Location: [facility];  Service: Endoscopy;  Laterality: N/A;  priority 4   ESOPHAGOGASTRODUODENOSCOPY (EGD) WITH PROPOFOL N/A 08/21/2019   Procedure: ESOPHAGOGASTRODUODENOSCOPY (EGD) WITH PROPOFOL;  Surgeon: Midge Minium, MD;  Location: [facility];  Service: Endoscopy;  Laterality: N/A;   POLYPECTOMY  08/21/2019   Procedure: POLYPECTOMY;  Surgeon: Midge Minium, MD;  Location: [facility];  Service: Endoscopy;;   SHOULDER SURGERY     TESTICLE SURGERY     age 81 or 64   TONSILLECTOMY     Patient Active Problem List   Diagnosis Date Noted   Encounter for screening colonoscopy    Polyp of transverse colon    Heartburn    Pure hypercholesterolemia 01/26/2016   Right lateral epicondylitis 07/07/2015   Vitamin D deficiency 12/24/2014   Prediabetes 12/24/2014   Allergic rhinitis, seasonal 06/03/2014   Acid reflux 06/03/2014   Essential (primary) hypertension 06/03/2014   Family history of diabetes mellitus 06/03/2014    Family history of cardiac disorder 06/03/2014   Obesity (BMI 30.0-34.9) 06/03/2014    ONSET DATE: 02/13/2020  REFERRING DIAG: G20.C (ICD-10-CM) - Parkinsonism   THERAPY DIAG:  Other lack of coordination  Difficulty in walking, not elsewhere classified  Unsteadiness on feet  Rationale for Evaluation and Treatment: Rehabilitation  SUBJECTIVE:  SUBJECTIVE STATEMENT: Pt reports allergies are bad today, not feeling as energetic as usual. Pt reports no falls. He felt good at the competition regarding his mobility. No other updates or concerns. Pt accompanied by: self  PERTINENT HISTORY:   Pt is a pleasant 62 yo male here for impairments related to Parkinsonism. The pt was diagnosed approx 2 years ago. He returns after absence following completion of LSVT BIG and is currently in speech for Parkinsonism-related impairments. He reports since completing LSVT BIG his movements have improved overall but he has experienced some decline since discharge. Pt reports he is having the most difficulty throughout L side, particularly L arm and ankle. He is also concerned about decreased strength and gait abnormality.  PMH significant for rheumatoid arthritis, HTN, hx of shoulder surgery  PAIN:  Are you having pain? Yes: NPRS scale: 3.5/10 Pain location: LBP Pain description: across low back Aggravating factors: sharp Relieving factors: laying down  PRECAUTIONS: Fall  WEIGHT BEARING RESTRICTIONS: No  FALLS: Has patient fallen in last 6 months? No  LIVING ENVIRONMENT: Lives with: lives with their family and lives with their spouse Lives in: House/apartment Stairs: Yes: Internal: >10 steps; not reported   PLOF: Independent  PATIENT GOALS:   Strengthening my legs and my arms, improving walking, particularly  arm-swing, getting out of the car more quickly after I have been driving for a while  OBJECTIVE:   DIAGNOSTIC FINDINGS:   Via chart 12/29/2021:  "MPRESSION: 1. Normal brain MRI. 2. NeuroQuant volumetric analysis of the brain, see details on [employer].     Electronically Signed   By: Tiburcio Pea M.D.   On: 01/01/2022 05:55"  COGNITION: Overall cognitive status: Within functional limits for tasks assessed   SENSATION: WFL In tact to light touch BLE   COORDINATION: Impaired primarily L side  EDEMA:  None observed or reported     POSTURE: forward head and increased thoracic kyphosis  LOWER EXTREMITY MMT:    LE strength grossly 4+/5 bilaterally, most deficits found LLE    TRANSFERS: Assistive device utilized: None  Sit to stand: Complete Independence however with decreased speed (see 5xSTS)   STAIRS: Level of Assistance: Modified independence Stair Negotiation Technique: Alternating Pattern  with Bilateral Rails Number of Stairs: 5-6  Pt reports more steps at home, can be tiring   GAIT: Gait pattern:  Decreased arm-swing bilat, however, L more severe than R and absent at times. Pt also with decreased heel strike LLE, at times exhibits festination with fatigue Distance walked: see for total distance Assistive device utilized: None Level of assistance: SBA   FUNCTIONAL TESTS:  5 times sit to stand: 13 seconds hands-free 6 minute walk test: 1241 ft and fatiguing. Worsening of gait mechanics with fatigue  10 meter walk test: 1.4 m/s  Berg Balance Scale: 49/56 Dynamic Gait Index: 19/24  Don and doff both shoes (sneakers): 53 seconds   TODAY'S TREATMENT:  DATE:   Gait belt donned and CGA provided throughout unless specified otherwise   NMR:  2# weights on wrists:  Ambulation with and without PVC in BUE for arm-swing cue  while pt completes laps (1 lap = 148 ft). Following multiple reps with and without PVC, pt then completes intervention with dual cog task. Addition of dual cog task continues to be more challenging.  With 2# weights donned each wrist-  -Don and doffing shoes using large-amplitude movement and finger flicks (5-10x bilat). Pt wearing work boots today  Trial 1:  65 seconds - donning only  Trial 2: 85 seconds Trial 3:  58 seconds  Trial 4: 52 seconds  Trial 5: 64 seconds  Pt rates medium   On red mat: added for additional balance challenge -Standing LTL step with BUE shoulder abduction 2x10 each way -Large amplitude bow and reach back, step back 2x10 each side  -Standing large amplitude twist with BUE shoulder abduction 10x each way   -Large-amplitude STS with BUE abduction and upright posture 2x12. Cuing for upright posture , finger abduction. P    PATIENT EDUCATION: Education details: Pt educated throughout session about proper posture and technique with exercises. Improved exercise technique, movement at target joints, use of target muscles after min to mod verbal, visual, tactile cues.  Person educated: Patient Education method: Explanation, Demonstration, Tactile cues, and Verbal cues Education comprehension: verbalized understanding, returned demonstration, verbal cues required, tactile cues required, and needs further education    HOME EXERCISE PROGRAM: 07/23/22: issued as printout: Standing and swinging your arms (alternating) -focus on swinging left arm behind you.  Perform this 6 days/week 3 sets of 20 reps. Sit to stand with arms out. Perform 5-6 days/week for 3 sets of 10 reps.   GOALS: Goals reviewed with patient? Yes   SHORT TERM GOALS: Target date: 08/23/2022    Patient will be independent in home exercise program to improve strength/mobility for better functional independence with ADLs. Baseline: to be initiated  Goal status: INITIAL   LONG TERM GOALS: Target  date: 10/04/2022   Patient will increase FOTO score to equal to or greater than     to demonstrate statistically significant improvement in mobility and quality of life.  Baseline:  Goal status: INITIAL  2.  Patient (< 96 years old) will complete five times sit to stand test in < 10 seconds indicating an increased LE strength and improved balance. Baseline: 13 seconds  Goal status: INITIAL  3.  Patient will increase Berg Balance score by > 6 points to demonstrate decreased fall risk during functional activities Baseline: 49/56 Goal status: INITIAL   4.  Patient will increase dynamic gait index score to >20/24 as to demonstrate reduced fall risk and improved dynamic gait balance for better safety with community/home ambulation.   Baseline: 19/24 Goal status: INITIAL  5.Patient will increase six minute walk test distance to >1300 for improve gait ability and endurance with community activities.  Baseline: 1241 ft and fatiguing. Worsening of gait mechanics with fatigue  Goal status: INITIAL  6. Patient will maintain L arm swing for at least 3 minutes without cuing in order to demonstrate improved ability to override hypokinetic movement.  Baseline: absent or significantly decreased arm-swing observed 100% of time during 3 minute, continuous walking period Goal status: INITIAL  7.  Pt will improve ability to doff/don both shoes while seated by at least 10 seconds in order to demonstrate improved functional mobility of BUE and increased ease in dressing. Baseline:  53 seconds Goal status: INITIAL      ASSESSMENT:  CLINICAL IMPRESSION: Pt returns to PT following absence due to travel for work. Pt able to advance dynamic balance activities to performing them on large red mat for compliant surface. Pt does not increased unsteadiness with this change, but is able to self-correct for minor LOB with use of step strategy, no greater than CGA. The pt will benefit from further skilled PT to  improve strength, gait, balance, and mobility.    OBJECTIVE IMPAIRMENTS: Abnormal gait, decreased activity tolerance, decreased balance, decreased coordination, decreased endurance, decreased mobility, difficulty walking, decreased strength, impaired UE functional use, improper body mechanics, postural dysfunction, and pain.   ACTIVITY LIMITATIONS: squatting, stairs, bed mobility, dressing, and locomotion level  PARTICIPATION LIMITATIONS: cleaning, community activity, occupation, and yard work  PERSONAL FACTORS: Age, Time since onset of injury/illness/exacerbation, and 3+ comorbidities: PMH significant for rheumatoid arthritis, HTN, hx of shoulder surgery  are also affecting patient's functional outcome.   REHAB POTENTIAL: Good  CLINICAL DECISION MAKING: Evolving/moderate complexity  EVALUATION COMPLEXITY: Moderate  PLAN:  PT FREQUENCY: 2x/week  PT DURATION: 12 weeks  PLANNED INTERVENTIONS: Therapeutic exercises, Therapeutic activity, Neuromuscular re-education, Balance training, Gait training, Patient/Family education, Self Care, Joint mobilization, Stair training, Vestibular training, Canalith repositioning, Visual/preceptual remediation/compensation, Orthotic/Fit training, DME instructions, Electrical stimulation, Wheelchair mobility training, Spinal mobilization, Cryotherapy, Moist heat, Taping, Ultrasound, Manual therapy, and Re-evaluation  PLAN FOR NEXT SESSION: Parkinson's specific exercises, SLB, rock and reach exercises, continue plan   Baird Kay, PT 08/14/2022, 2:52 PM

## 2022-08-15 ENCOUNTER — Ambulatory Visit: Payer: 59 | Admitting: Speech Pathology

## 2022-08-15 DIAGNOSIS — R278 Other lack of coordination: Secondary | ICD-10-CM | POA: Diagnosis not present

## 2022-08-15 DIAGNOSIS — R471 Dysarthria and anarthria: Secondary | ICD-10-CM

## 2022-08-15 NOTE — Therapy (Signed)
OUTPATIENT SPEECH LANGUAGE PATHOLOGY SPEECH TREATMENT   Patient Name: Tyler Glover MRN: [id number] DOB:10-14-1960, 62 y.o., male Today's Date: 08/15/2022  PCP: Duanne Limerick, MD  REFERRING PROVIDER: Lonell Face, MD    End of Session - 08/15/22 0925     SLP Start Time 0925    SLP Stop Time  1015    SLP Time Calculation (min) 50 min             Past Medical History:  Diagnosis Date   Allergy    Arthritis    Rheumatoid   GERD (gastroesophageal reflux disease)    Hypertension    Past Surgical History:  Procedure Laterality Date   COLONOSCOPY     COLONOSCOPY WITH PROPOFOL N/A 08/21/2019   Procedure: COLONOSCOPY WITH PROPOFOL;  Surgeon: Midge Minium, MD;  Location: [facility];  Service: Endoscopy;  Laterality: N/A;  priority 4   ESOPHAGOGASTRODUODENOSCOPY (EGD) WITH PROPOFOL N/A 08/21/2019   Procedure: ESOPHAGOGASTRODUODENOSCOPY (EGD) WITH PROPOFOL;  Surgeon: Midge Minium, MD;  Location: [facility];  Service: Endoscopy;  Laterality: N/A;   POLYPECTOMY  08/21/2019   Procedure: POLYPECTOMY;  Surgeon: Midge Minium, MD;  Location: [facility];  Service: Endoscopy;;   SHOULDER SURGERY     TESTICLE SURGERY     age 39 or 73   TONSILLECTOMY     Patient Active Problem List   Diagnosis Date Noted   Encounter for screening colonoscopy    Polyp of transverse colon    Heartburn    Pure hypercholesterolemia 01/26/2016   Right lateral epicondylitis 07/07/2015   Vitamin D deficiency 12/24/2014   Prediabetes 12/24/2014   Allergic rhinitis, seasonal 06/03/2014   Acid reflux 06/03/2014   Essential (primary) hypertension 06/03/2014   Family history of diabetes mellitus 06/03/2014   Family history of cardiac disorder 06/03/2014   Obesity (BMI 30.0-34.9) 06/03/2014    ONSET DATE: PD dx 12/01/2021, referral date 02/06/22  REFERRING DIAG: Parkinsonism  THERAPY DIAG:  Dysarthria and anarthria  Rationale for Evaluation and Treatment  Rehabilitation  SUBJECTIVE:   SUBJECTIVE STATEMENT: Pt greeted SLP in WNL voice. Eager for tx. Stated wife is noticing improved loudness at home. Reports being pleased with progress to date.   Pt accompanied by: self  PERTINENT HISTORY: Pt is a 62 year-old male who presents for motor speech evaluation. Pt reports initial symptom onset approximately 2 years ago. Recently changed from sinemet to amantidine due to unwanted symptoms. 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 He has also gotten feedback about flat intonation in his speech. His job requires high Tour manager, teaching over noise, and workplace meetings. PMH significant for rheumatoid arthritis, HTN, reflux.   DIAGNOSTIC FINDINGS: MRI Brain 01/01/22: "normal brain MRI"  PAIN:  Are you having pain? No.   PATIENT GOALS  "So people can hear me."  OBJECTIVE:  TODAY'S TREATMENT: Pt performed vocal "warm up" from in HEP for dysarthria with min cueing. Pt endorse limited use of HEP outside of SLP tx sessions. Educated re: rationale for HEP/importance. During structured speech tasks, including warm up, pt able to maintain adequate vocal loudness >70 db ~90% of the time. Pt benefited from use of biofeedback and initial cueing for breath support, phrasing, and posture. Pt has made steady improvement in vocal intensity, and continues lack of "intonation" as communication barrier. Prosody targeted at the sentence and phrase level. Pt required min-mod cueing to change intonation on  phrases and sentences by modify rate, rhythm, and intonation. Throughout session, pt educated re: changes to speech in Parkinsonism, ways to improve vocal intensity, and ways to modify prosody. Reinforcement of content needed.   PATIENT EDUCATION: Education details: as above Person educated: Patient Education method: Psychiatrist comprehension: verbalized  understanding, returned demonstration, verbal cues required, and needs further education   HOME EXERCISE PROGRAM: To be provided next session  GOALS: Goals reviewed with patient? Yes  SHORT TERM GOALS: Target date: 10 sessions  Patient will demo HEP for dysarthria accurately with min cues. Baseline: Goal status: INITIAL  2.  Patient will maintain adequate vocal quality and intensity (>70dB) in sentence level responses 90% accuracy. Baseline:  Goal status: INITIAL  3.  Patient will use dysarthria compensations (slow, loud, overpronounce, pause) in 2-3 sentence responses 90% accuracy with moderate cues. Baseline:  Goal status: INITIAL    LONG TERM GOALS: Target date: 09/11/2022  Patient will demo HEP for dysarthria independently. Baseline:  Goal status: INITIAL  2.  Patient will maintain intensity (avg >70dB) in 25 minutes conversation with modified independence Baseline:  Goal status: INITIAL  3.  Patient will use dysarthria compensations (slow, loud, overpronounce, pause) for intelligibility >90% in 25 minutes conversation. Baseline:  Goal status: INITIAL  4.  Patient will report improved satisfaction with vocal quality in the workplace than prior to ST.  Baseline:  Goal status: INITIAL  ASSESSMENT:  CLINICAL IMPRESSION: Patient presents with mild hypokinetic dysarthria characterized by reduced vocal intensity, reduced breath support, reduced pitch variability, intermittent rapid rate, and intermittent hoarse vocal quality. Patient reports noting others leaning in to hear him. He is an Producer, television/film/video and has received feedback from his students that his voice is flat/monotone. Patient responds well to audio feedback and increases vocal intensity with occasional min-mod cues today. See today's treatment note for additional details. Will utilize intensity-based approach 2x per week for 8 weeks. I recommend skilled ST to improve vocal quality, endurance, and intensity  for improved intelligibility and engagement of his students, and to meet other vocal demands of work and home.   OBJECTIVE IMPAIRMENTS include dysarthria. These impairments are limiting patient from effectively communicating at home and in community. Factors affecting potential to achieve goals and functional outcome are  scheduling; pt having difficulty coordinating LSVT schedule with work . Patient will benefit from skilled SLP services to address above impairments and improve overall function.  REHAB POTENTIAL: Excellent  PLAN: SLP FREQUENCY: 2x/week  SLP DURATION: 8 weeks *may delay start date due to scheduling conflicts; may need to change to 2x per week for 8 weeks   PLANNED INTERVENTIONS: Functional tasks, SLP instruction and feedback, Patient/family education, and Re-evaluation   Clyde Canterbury, M.S., CCC-SLP Speech-Language Pathologist  - [facility] [phone] (ASCOM)   [facility] [address] [place], [place], [address] Phone: [phone]   Fax:  [phone]

## 2022-08-21 ENCOUNTER — Ambulatory Visit: Payer: 59

## 2022-08-21 ENCOUNTER — Other Ambulatory Visit: Payer: Self-pay | Admitting: Family Medicine

## 2022-08-21 ENCOUNTER — Encounter: Payer: 59 | Admitting: Speech Pathology

## 2022-08-21 DIAGNOSIS — J301 Allergic rhinitis due to pollen: Secondary | ICD-10-CM

## 2022-08-23 ENCOUNTER — Ambulatory Visit: Payer: 59

## 2022-08-23 ENCOUNTER — Encounter: Payer: 59 | Admitting: Speech Pathology

## 2022-08-28 ENCOUNTER — Encounter: Payer: 59 | Admitting: Speech Pathology

## 2022-08-28 ENCOUNTER — Ambulatory Visit: Payer: 59

## 2022-08-30 ENCOUNTER — Ambulatory Visit: Payer: 59

## 2022-08-30 ENCOUNTER — Encounter: Payer: 59 | Admitting: Speech Pathology

## 2022-09-04 ENCOUNTER — Encounter: Payer: 59 | Admitting: Speech Pathology

## 2022-09-04 ENCOUNTER — Ambulatory Visit: Payer: 59

## 2022-09-06 ENCOUNTER — Ambulatory Visit: Payer: 59

## 2022-09-06 ENCOUNTER — Encounter: Payer: 59 | Admitting: Speech Pathology

## 2022-09-11 ENCOUNTER — Ambulatory Visit: Payer: 59 | Admitting: Speech Pathology

## 2022-09-11 ENCOUNTER — Ambulatory Visit: Payer: 59

## 2022-09-11 DIAGNOSIS — R278 Other lack of coordination: Secondary | ICD-10-CM

## 2022-09-11 DIAGNOSIS — R262 Difficulty in walking, not elsewhere classified: Secondary | ICD-10-CM

## 2022-09-11 DIAGNOSIS — R2681 Unsteadiness on feet: Secondary | ICD-10-CM

## 2022-09-11 NOTE — Therapy (Signed)
OUTPATIENT PHYSICAL THERAPY NEURO TREATMENT NOTE   Patient Name: Tyler Glover MRN: [id number] DOB:April 23, 1960, 62 y.o., male Today's Date: 09/11/2022   PCP:    Elias Else   REFERRING PROVIDER:   Lonell Face, MD    END OF SESSION:  PT End of Session - 09/11/22 1023     Visit Number 7    Number of Visits 25    Date for PT Re-Evaluation 10/04/22    PT Start Time 1021    PT Stop Time 1100    PT Time Calculation (min) 39 min    Equipment Utilized During Treatment Gait belt    Activity Tolerance Patient tolerated treatment well    Behavior During Therapy WFL for tasks assessed/performed               Past Medical History:  Diagnosis Date   Allergy    Arthritis    Rheumatoid   GERD (gastroesophageal reflux disease)    Hypertension    Past Surgical History:  Procedure Laterality Date   COLONOSCOPY     COLONOSCOPY WITH PROPOFOL N/A 08/21/2019   Procedure: COLONOSCOPY WITH PROPOFOL;  Surgeon: Midge Minium, MD;  Location: [facility];  Service: Endoscopy;  Laterality: N/A;  priority 4   ESOPHAGOGASTRODUODENOSCOPY (EGD) WITH PROPOFOL N/A 08/21/2019   Procedure: ESOPHAGOGASTRODUODENOSCOPY (EGD) WITH PROPOFOL;  Surgeon: Midge Minium, MD;  Location: [facility];  Service: Endoscopy;  Laterality: N/A;   POLYPECTOMY  08/21/2019   Procedure: POLYPECTOMY;  Surgeon: Midge Minium, MD;  Location: [facility];  Service: Endoscopy;;   SHOULDER SURGERY     TESTICLE SURGERY     age 70 or 55   TONSILLECTOMY     Patient Active Problem List   Diagnosis Date Noted   Encounter for screening colonoscopy    Polyp of transverse colon    Heartburn    Pure hypercholesterolemia 01/26/2016   Right lateral epicondylitis 07/07/2015   Vitamin D deficiency 12/24/2014   Prediabetes 12/24/2014   Allergic rhinitis, seasonal 06/03/2014   Acid reflux 06/03/2014   Essential (primary) hypertension 06/03/2014   Family history of diabetes mellitus 06/03/2014    Family history of cardiac disorder 06/03/2014   Obesity (BMI 30.0-34.9) 06/03/2014    ONSET DATE: 02/13/2020  REFERRING DIAG: G20.C (ICD-10-CM) - Parkinsonism   THERAPY DIAG:  Other lack of coordination  Unsteadiness on feet  Difficulty in walking, not elsewhere classified  Rationale for Evaluation and Treatment: Rehabilitation  SUBJECTIVE:  SUBJECTIVE STATEMENT: Pt returns to PT following work trips. Reports L knee pain at 4/10 currently, reports twisted knee when handling luggage the other day. Pt reports no falls/stumbles or other changes. Pt accompanied by: self  PERTINENT HISTORY:   Pt is a pleasant 62 yo male here for impairments related to Parkinsonism. The pt was diagnosed approx 2 years ago. He returns after absence following completion of LSVT BIG and is currently in speech for Parkinsonism-related impairments. He reports since completing LSVT BIG his movements have improved overall but he has experienced some decline since discharge. Pt reports he is having the most difficulty throughout L side, particularly L arm and ankle. He is also concerned about decreased strength and gait abnormality.  PMH significant for rheumatoid arthritis, HTN, hx of shoulder surgery  PAIN:  Are you having pain? Yes: NPRS scale: 3.5/10 Pain location: LBP Pain description: across low back Aggravating factors: sharp Relieving factors: laying down  PRECAUTIONS: Fall  WEIGHT BEARING RESTRICTIONS: No  FALLS: Has patient fallen in last 6 months? No  LIVING ENVIRONMENT: Lives with: lives with their family and lives with their spouse Lives in: House/apartment Stairs: Yes: Internal: >10 steps; not reported   PLOF: Independent  PATIENT GOALS:   Strengthening my legs and my arms, improving walking,  particularly arm-swing, getting out of the car more quickly after I have been driving for a while  OBJECTIVE:   DIAGNOSTIC FINDINGS:   Via chart 12/29/2021:  "MPRESSION: 1. Normal brain MRI. 2. NeuroQuant volumetric analysis of the brain, see details on [employer].     Electronically Signed   By: Tiburcio Pea M.D.   On: 01/01/2022 05:55"  COGNITION: Overall cognitive status: Within functional limits for tasks assessed   SENSATION: WFL In tact to light touch BLE   COORDINATION: Impaired primarily L side  EDEMA:  None observed or reported     POSTURE: forward head and increased thoracic kyphosis  LOWER EXTREMITY MMT:    LE strength grossly 4+/5 bilaterally, most deficits found LLE    TRANSFERS: Assistive device utilized: None  Sit to stand: Complete Independence however with decreased speed (see 5xSTS)   STAIRS: Level of Assistance: Modified independence Stair Negotiation Technique: Alternating Pattern  with Bilateral Rails Number of Stairs: 5-6  Pt reports more steps at home, can be tiring   GAIT: Gait pattern:  Decreased arm-swing bilat, however, L more severe than R and absent at times. Pt also with decreased heel strike LLE, at times exhibits festination with fatigue Distance walked: see for total distance Assistive device utilized: None Level of assistance: SBA   FUNCTIONAL TESTS:  5 times sit to stand: 13 seconds hands-free 6 minute walk test: 1241 ft and fatiguing. Worsening of gait mechanics with fatigue  10 meter walk test: 1.4 m/s  Berg Balance Scale: 49/56 Dynamic Gait Index: 19/24  Don and doff both shoes (sneakers): 53 seconds   TODAY'S TREATMENT:  DATE:  09/11/22  Gait belt donned and CGA provided throughout unless specified otherwise   NMR:  Dynamic gait with 1.5# wrist weights focus on heel strike  bilat, arm-swing, and upright posture 1x444 ft -component training L heel strike x multiple reps -component training arm swing - focus on L (standing by mirror) multiple reps then completes additional 1x148 ft ambulation. Pt still with difficulty sustaining L arm swing   With 1.5# weights donned each wrist-  -Don and doffing shoes using large-amplitude movement and finger flicks (5-10x bilat). Wearing sneakers  Trial 1:  50.7 seconds -  Trial 2: 53 seconds Trial 3:  44 seconds  Trial 4: 42 seconds  Trial 5: 45 seconds  Pt rates medium. Pt reports he had been struggling at home with this activity.  On red mat: added for additional balance challenge, 1.5# wrist weights donned -Standing LTL step with BUE shoulder abduction 2x15 each way - large amplitude stomp/big step into lunge position  10x each LE, then lunge position with alt arm-swing and fwd/bcwkd rocks 10x each side -Large amplitude bow and reach back/step back 2x10 each side -  L arm swing improves with second set  -Large-amplitude STS with LE on airex with BUE abduction and upright posture 2x12.     PATIENT EDUCATION: Education details: Pt educated throughout session about proper posture and technique with exercises. Improved exercise technique, movement at target joints, use of target muscles after min to mod verbal, visual, tactile cues.  Person educated: Patient Education method: Explanation, Demonstration, Tactile cues, and Verbal cues Education comprehension: verbalized understanding, returned demonstration, verbal cues required, tactile cues required, and needs further education    HOME EXERCISE PROGRAM: 07/23/22: issued as printout: Standing and swinging your arms (alternating) -focus on swinging left arm behind you.  Perform this 6 days/week 3 sets of 20 reps. Sit to stand with arms out. Perform 5-6 days/week for 3 sets of 10 reps.   GOALS: Goals reviewed with patient? Yes   SHORT TERM GOALS: Target date:  08/23/2022    Patient will be independent in home exercise program to improve strength/mobility for better functional independence with ADLs. Baseline: to be initiated  Goal status: INITIAL   LONG TERM GOALS: Target date: 10/04/2022   Patient will increase FOTO score to equal to or greater than     to demonstrate statistically significant improvement in mobility and quality of life.  Baseline:  Goal status: INITIAL  2.  Patient (< 35 years old) will complete five times sit to stand test in < 10 seconds indicating an increased LE strength and improved balance. Baseline: 13 seconds  Goal status: INITIAL  3.  Patient will increase Berg Balance score by > 6 points to demonstrate decreased fall risk during functional activities Baseline: 49/56 Goal status: INITIAL   4.  Patient will increase dynamic gait index score to >20/24 as to demonstrate reduced fall risk and improved dynamic gait balance for better safety with community/home ambulation.   Baseline: 19/24 Goal status: INITIAL  5.Patient will increase six minute walk test distance to >1300 for improve gait ability and endurance with community activities.  Baseline: 1241 ft and fatiguing. Worsening of gait mechanics with fatigue  Goal status: INITIAL  6. Patient will maintain L arm swing for at least 3 minutes without cuing in order to demonstrate improved ability to override hypokinetic movement.  Baseline: absent or significantly decreased arm-swing observed 100% of time during 3 minute, continuous walking period Goal status: INITIAL  7.  Pt will improve ability to doff/don both shoes while seated by at least 10 seconds in order to demonstrate improved functional mobility of BUE and increased ease in dressing. Baseline: 53 seconds Goal status: INITIAL      ASSESSMENT:  CLINICAL IMPRESSION: Pt returns to PT following absence due to work travel. Pt did exhibit some increased difficulty today with sustaining arm-swing and  heel-strike with gait. He did show some within-session improvement immediately following cues, but struggled again with technique when he started to fatigue. Will continue to address these impairments next visit. The pt will benefit from further skilled PT to improve strength, gait, balance, and mobility.    OBJECTIVE IMPAIRMENTS: Abnormal gait, decreased activity tolerance, decreased balance, decreased coordination, decreased endurance, decreased mobility, difficulty walking, decreased strength, impaired UE functional use, improper body mechanics, postural dysfunction, and pain.   ACTIVITY LIMITATIONS: squatting, stairs, bed mobility, dressing, and locomotion level  PARTICIPATION LIMITATIONS: cleaning, community activity, occupation, and yard work  PERSONAL FACTORS: Age, Time since onset of injury/illness/exacerbation, and 3+ comorbidities: PMH significant for rheumatoid arthritis, HTN, hx of shoulder surgery  are also affecting patient's functional outcome.   REHAB POTENTIAL: Good  CLINICAL DECISION MAKING: Evolving/moderate complexity  EVALUATION COMPLEXITY: Moderate  PLAN:  PT FREQUENCY: 2x/week  PT DURATION: 12 weeks  PLANNED INTERVENTIONS: Therapeutic exercises, Therapeutic activity, Neuromuscular re-education, Balance training, Gait training, Patient/Family education, Self Care, Joint mobilization, Stair training, Vestibular training, Canalith repositioning, Visual/preceptual remediation/compensation, Orthotic/Fit training, DME instructions, Electrical stimulation, Wheelchair mobility training, Spinal mobilization, Cryotherapy, Moist heat, Taping, Ultrasound, Manual therapy, and Re-evaluation  PLAN FOR NEXT SESSION: Parkinson's specific exercises, SLB, rock and reach exercises, continue plan   Baird Kay, PT 09/11/2022, 1:28 PM

## 2022-09-13 ENCOUNTER — Ambulatory Visit: Payer: 59 | Attending: Neurology

## 2022-09-13 ENCOUNTER — Ambulatory Visit: Payer: 59 | Admitting: Speech Pathology

## 2022-09-13 DIAGNOSIS — R262 Difficulty in walking, not elsewhere classified: Secondary | ICD-10-CM | POA: Insufficient documentation

## 2022-09-13 DIAGNOSIS — R471 Dysarthria and anarthria: Secondary | ICD-10-CM | POA: Insufficient documentation

## 2022-09-13 DIAGNOSIS — R278 Other lack of coordination: Secondary | ICD-10-CM | POA: Diagnosis present

## 2022-09-13 DIAGNOSIS — R2681 Unsteadiness on feet: Secondary | ICD-10-CM | POA: Insufficient documentation

## 2022-09-13 DIAGNOSIS — M6281 Muscle weakness (generalized): Secondary | ICD-10-CM | POA: Insufficient documentation

## 2022-09-13 NOTE — Therapy (Signed)
OUTPATIENT SPEECH LANGUAGE PATHOLOGY SPEECH TREATMENT / RE-CERTIFICATION   Patient Name: Tyler Glover MRN: [id number] DOB:1960/11/30, 62 y.o., male Today's Date: 09/13/2022  PCP: Duanne Limerick, MD  REFERRING PROVIDER: Lonell Face, MD    End of Session - 09/13/22 1022     Visit Number 1    Number of Visits 12    Date for SLP Re-Evaluation 12/06/22             Past Medical History:  Diagnosis Date   Allergy    Arthritis    Rheumatoid   GERD (gastroesophageal reflux disease)    Hypertension    Past Surgical History:  Procedure Laterality Date   COLONOSCOPY     COLONOSCOPY WITH PROPOFOL N/A 08/21/2019   Procedure: COLONOSCOPY WITH PROPOFOL;  Surgeon: Midge Minium, MD;  Location: [facility];  Service: Endoscopy;  Laterality: N/A;  priority 4   ESOPHAGOGASTRODUODENOSCOPY (EGD) WITH PROPOFOL N/A 08/21/2019   Procedure: ESOPHAGOGASTRODUODENOSCOPY (EGD) WITH PROPOFOL;  Surgeon: Midge Minium, MD;  Location: [facility];  Service: Endoscopy;  Laterality: N/A;   POLYPECTOMY  08/21/2019   Procedure: POLYPECTOMY;  Surgeon: Midge Minium, MD;  Location: [facility];  Service: Endoscopy;;   SHOULDER SURGERY     TESTICLE SURGERY     age 61 or 55   TONSILLECTOMY     Patient Active Problem List   Diagnosis Date Noted   Encounter for screening colonoscopy    Polyp of transverse colon    Heartburn    Pure hypercholesterolemia 01/26/2016   Right lateral epicondylitis 07/07/2015   Vitamin D deficiency 12/24/2014   Prediabetes 12/24/2014   Allergic rhinitis, seasonal 06/03/2014   Acid reflux 06/03/2014   Essential (primary) hypertension 06/03/2014   Family history of diabetes mellitus 06/03/2014   Family history of cardiac disorder 06/03/2014   Obesity (BMI 30.0-34.9) 06/03/2014    ONSET DATE: PD dx 12/01/2021, referral date 02/06/22  REFERRING DIAG: Parkinsonism  THERAPY DIAG:  Dysarthria and anarthria  Rationale for Evaluation and Treatment  Rehabilitation  SUBJECTIVE:   SUBJECTIVE STATEMENT: Pt greeted SLP in WNL voice. Eager for tx. Stated elderly mother-in-law said she could "hear me better." Reports being pleased with progress to date.   Pt accompanied by: self  PERTINENT HISTORY: Pt is a 62 year-old male who presents for motor speech evaluation. Pt reports initial symptom onset approximately 2 years ago. Recently changed from sinemet to amantidine due to unwanted symptoms. 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 He has also gotten feedback about flat intonation in his speech. His job requires high Tour manager, teaching over noise, and workplace meetings. PMH significant for rheumatoid arthritis, HTN, reflux.   DIAGNOSTIC FINDINGS: MRI Brain 01/01/22: "normal brain MRI"  PAIN:  Are you having pain? No.   PATIENT GOALS  "to be an effective communicator for my occupation"  OBJECTIVE:  TODAY'S TREATMENT: Re-asessment as follows  MOTOR SPEECH EVALUATION: Maximum phonation time for sustained "ah": 34s  Mean intensity during sustained "ah": 81 dB  Mean fundamental frequency during sustained "ah": 160 Hz (within 1 SD average of 145 Hz +/- 23 for gender) Mean intensity sustained during conversational speech: 75 dB Habitual pitch: 135 Hz Highest dynamic pitch when altering pitch from a low note to a high note: 331 Hz Lowest dynamic pitch when altering from a high note to a low note: 118 Hz Highest dynamic pitch during conversational speech: 198 Hz  Lowest dynamic pitch during conversational speech: 81 Hz Average time patient was able to sustain /s/: 13.5 seconds Average time patient was able to sustain /z/: 14.8 seconds s/z ratio : .91    PATIENT REPORTED OUTCOME  VOICE HANDICAP INDEX (VHI)  The Voice Handicap Index is comprised of a series of questions to assess the patient's perception of their voice. It is designed to evaluate  the emotional, physical and functional components of the voice problem.  Functional: 20/40 Physical: 15 Emotional: 1/40 Total: 36/120 moderate   PATIENT EDUCATION: Education details: progress to date; updated goals; POC Person educated: Patient Education method: Medical illustrator Education comprehension: verbalized understanding, returned demonstration, verbal cues required, and needs further education   HOME EXERCISE PROGRAM: Continue HEP  GOALS: Goals reviewed with patient? Yes  SHORT TERM GOALS: Target date: 10 sessions  Patient will demo HEP for dysarthria accurately with min cues. Baseline: Goal status: MET  2.  Patient will maintain adequate vocal quality and intensity (>70dB) in sentence level responses 90% accuracy. Baseline:  Goal status: MET  3.  Patient will use dysarthria compensations (slow, loud, overpronounce, pause) in 2-3 sentence responses 90% accuracy with moderate cues. Baseline:  Goal status: MET  New goals initiated 8/1  Pt will  maintain adequate vocal quality and intensity (>70 db) at the paragraph level with 90% accuracy.  2. Pt will complete contrastive stress and prosody drills with min assistance.   3. Pt will identify and report implementation x1-2 successful attention getting strategies to use in the classroom.        LONG TERM GOALS: Target date: 09/11/2022 initial; 12/06/22   Patient will demo HEP for dysarthria independently. Baseline:  Goal status: MET  2.  Patient will maintain intensity (avg >70dB) in 25 minutes conversation with modified independence Baseline:  Goal status: IN PROGRESS  3.  Patient will use dysarthria compensations (slow, loud, overpronounce, pause) for intelligibility >90% in 25 minutes conversation. Baseline:  Goal status: IN PROGRESS  4.  Patient will report improved satisfaction with vocal quality in the workplace than prior to ST.  Baseline:  Goal status: IN  PROGRESS  ASSESSMENT:  CLINICAL IMPRESSION: Patient presents with mild hypokinetic dysarthria characterized by intermittently reduced vocal intensity, intermittently reduced breath support, reduced pitch variability, intermittent rapid rate, and occasional hoarse vocal quality.Pt has made great progress toward SLP goals; however, progress has likely been affected by pt's availability. 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 See today's treatment note for additional details. I recommend skilled ST to improve vocal quality, endurance, and intensity for improved intelligibility and engagement of his students, and to meet other vocal demands of work and home.   OBJECTIVE IMPAIRMENTS include dysarthria. These impairments are limiting patient from effectively communicating at home and in community. Factors affecting potential to achieve goals and functional outcome are  scheduling; pt having difficulty coordinating LSVT schedule with work . Patient will benefit from skilled SLP services to address above impairments and improve overall function.  REHAB POTENTIAL: Excellent  PLAN: SLP FREQUENCY: 1x/week (due to returning to work)  SLP DURATION: 12 weeks   PLANNED INTERVENTIONS: Functional tasks, SLP instruction and feedback, Patient/family education, and Re-evaluation   Clyde Canterbury, M.S., CCC-SLP Speech-Language Pathologist [place] [facility] [phone] (ASCOM)   [facility] [address] [place], [place], [address] Phone: [phone]  Fax:  [phone]

## 2022-09-13 NOTE — Therapy (Signed)
OUTPATIENT PHYSICAL THERAPY NEURO TREATMENT NOTE   Patient Name: Tyler Glover MRN: [id number] DOB:1960-03-02, 62 y.o., male Today's Date: 09/14/2022   PCP:    Elias Else   REFERRING PROVIDER:   Lonell Face, MD    END OF SESSION:  PT End of Session - 09/14/22 0848     Visit Number 8    Number of Visits 25    Date for PT Re-Evaluation 10/04/22    PT Start Time 0934    PT Stop Time 1014    PT Time Calculation (min) 40 min    Equipment Utilized During Treatment Gait belt    Activity Tolerance Patient tolerated treatment well    Behavior During Therapy WFL for tasks assessed/performed                Past Medical History:  Diagnosis Date   Allergy    Arthritis    Rheumatoid   GERD (gastroesophageal reflux disease)    Hypertension    Past Surgical History:  Procedure Laterality Date   COLONOSCOPY     COLONOSCOPY WITH PROPOFOL N/A 08/21/2019   Procedure: COLONOSCOPY WITH PROPOFOL;  Surgeon: Midge Minium, MD;  Location: [facility];  Service: Endoscopy;  Laterality: N/A;  priority 4   ESOPHAGOGASTRODUODENOSCOPY (EGD) WITH PROPOFOL N/A 08/21/2019   Procedure: ESOPHAGOGASTRODUODENOSCOPY (EGD) WITH PROPOFOL;  Surgeon: Midge Minium, MD;  Location: [facility];  Service: Endoscopy;  Laterality: N/A;   POLYPECTOMY  08/21/2019   Procedure: POLYPECTOMY;  Surgeon: Midge Minium, MD;  Location: [facility];  Service: Endoscopy;;   SHOULDER SURGERY     TESTICLE SURGERY     age 60 or 20   TONSILLECTOMY     Patient Active Problem List   Diagnosis Date Noted   Encounter for screening colonoscopy    Polyp of transverse colon    Heartburn    Pure hypercholesterolemia 01/26/2016   Right lateral epicondylitis 07/07/2015   Vitamin D deficiency 12/24/2014   Prediabetes 12/24/2014   Allergic rhinitis, seasonal 06/03/2014   Acid reflux 06/03/2014   Essential (primary) hypertension 06/03/2014   Family history of diabetes mellitus 06/03/2014    Family history of cardiac disorder 06/03/2014   Obesity (BMI 30.0-34.9) 06/03/2014    ONSET DATE: 02/13/2020  REFERRING DIAG: G20.C (ICD-10-CM) - Parkinsonism   THERAPY DIAG:  Other lack of coordination  Difficulty in walking, not elsewhere classified  Muscle weakness (generalized)  Unsteadiness on feet  Rationale for Evaluation and Treatment: Rehabilitation  SUBJECTIVE:  SUBJECTIVE STATEMENT: Pt reports usual pains and that he is at baseline. He reports no stumbles/falls or other updates. Pt accompanied by: self  PERTINENT HISTORY:   Pt is a pleasant 62 yo male here for impairments related to Parkinsonism. The pt was diagnosed approx 2 years ago. He returns after absence following completion of LSVT BIG and is currently in speech for Parkinsonism-related impairments. He reports since completing LSVT BIG his movements have improved overall but he has experienced some decline since discharge. Pt reports he is having the most difficulty throughout L side, particularly L arm and ankle. He is also concerned about decreased strength and gait abnormality.  PMH significant for rheumatoid arthritis, HTN, hx of shoulder surgery  PAIN:  Are you having pain? Yes: NPRS scale: 3.5/10 Pain location: LBP Pain description: across low back Aggravating factors: sharp Relieving factors: laying down  PRECAUTIONS: Fall  WEIGHT BEARING RESTRICTIONS: No  FALLS: Has patient fallen in last 6 months? No  LIVING ENVIRONMENT: Lives with: lives with their family and lives with their spouse Lives in: House/apartment Stairs: Yes: Internal: >10 steps; not reported   PLOF: Independent  PATIENT GOALS:   Strengthening my legs and my arms, improving walking, particularly arm-swing, getting out of the car more quickly  after I have been driving for a while  OBJECTIVE:   DIAGNOSTIC FINDINGS:   Via chart 12/29/2021:  "MPRESSION: 1. Normal brain MRI. 2. NeuroQuant volumetric analysis of the brain, see details on [employer].     Electronically Signed   By: Tiburcio Pea M.D.   On: 01/01/2022 05:55"  COGNITION: Overall cognitive status: Within functional limits for tasks assessed   SENSATION: WFL In tact to light touch BLE   COORDINATION: Impaired primarily L side  EDEMA:  None observed or reported     POSTURE: forward head and increased thoracic kyphosis  LOWER EXTREMITY MMT:    LE strength grossly 4+/5 bilaterally, most deficits found LLE    TRANSFERS: Assistive device utilized: None  Sit to stand: Complete Independence however with decreased speed (see 5xSTS)   STAIRS: Level of Assistance: Modified independence Stair Negotiation Technique: Alternating Pattern  with Bilateral Rails Number of Stairs: 5-6  Pt reports more steps at home, can be tiring   GAIT: Gait pattern:  Decreased arm-swing bilat, however, L more severe than R and absent at times. Pt also with decreased heel strike LLE, at times exhibits festination with fatigue Distance walked: see for total distance Assistive device utilized: None Level of assistance: SBA   FUNCTIONAL TESTS:  5 times sit to stand: 13 seconds hands-free 6 minute walk test: 1241 ft and fatiguing. Worsening of gait mechanics with fatigue  10 meter walk test: 1.4 m/s  Berg Balance Scale: 49/56 Dynamic Gait Index: 19/24  Don and doff both shoes (sneakers): 53 seconds   TODAY'S TREATMENT:  DATE:  09/14/22  Gait belt donned and CGA provided throughout unless specified otherwise   NMR:  Large-amplitude movement training ambulation outdoors with 1.5# wrist weights: performed over different surfaces,  up/down slops, curves with dual cog task and without dual cog task, focus on L arm swing and heel strike x several minutes. Pt has difficulty sustaining correct technique throughout.  KoreBalance: With BUE>UUE support -Static balance training (target in center) with EO x 3 rounds, EC x 3 rounds. Stability of platform at level 6.  More difficulty with EC with UUE support, exhibits increased distance from center/target -TuxRacer BUE support to no UE support 4x, stability at 6. Reports "way challenging" without UE support Performed to promote hip/ankle strategies, accuracy/timing of weight-shifts, and use of proprioceptive, vestibular and visual information to maintain balance.  -Don and doffing shoes using large-amplitude movement and finger flicks (5-10x bilat). Wearing sneakers  Trial 1:  77 seconds  Trial 2: 78  seconds Trial 3:  53 seconds  Trial 4: 47 seconds  Trial 5: 47 seconds  Reviewed new technique for donning sneakers (using loop on back to assist heel into shoe). Rates medium-hard   PATIENT EDUCATION: Education details: Pt educated throughout session about proper posture and technique with exercises. Improved exercise technique, movement at target joints, use of target muscles after min to mod verbal, visual, tactile cues.  Person educated: Patient Education method: Explanation, Demonstration, Tactile cues, and Verbal cues Education comprehension: verbalized understanding, returned demonstration, verbal cues required, tactile cues required, and needs further education    HOME EXERCISE PROGRAM: 07/23/22: issued as printout: Standing and swinging your arms (alternating) -focus on swinging left arm behind you.  Perform this 6 days/week 3 sets of 20 reps. Sit to stand with arms out. Perform 5-6 days/week for 3 sets of 10 reps.   GOALS: Goals reviewed with patient? Yes   SHORT TERM GOALS: Target date: 08/23/2022    Patient will be independent in home exercise program to  improve strength/mobility for better functional independence with ADLs. Baseline: to be initiated  Goal status: INITIAL   LONG TERM GOALS: Target date: 10/04/2022   Patient will increase FOTO score to equal to or greater than     to demonstrate statistically significant improvement in mobility and quality of life.  Baseline:  Goal status: INITIAL  2.  Patient (< 56 years old) will complete five times sit to stand test in < 10 seconds indicating an increased LE strength and improved balance. Baseline: 13 seconds  Goal status: INITIAL  3.  Patient will increase Berg Balance score by > 6 points to demonstrate decreased fall risk during functional activities Baseline: 49/56 Goal status: INITIAL   4.  Patient will increase dynamic gait index score to >20/24 as to demonstrate reduced fall risk and improved dynamic gait balance for better safety with community/home ambulation.   Baseline: 19/24 Goal status: INITIAL  5.Patient will increase six minute walk test distance to >1300 for improve gait ability and endurance with community activities.  Baseline: 1241 ft and fatiguing. Worsening of gait mechanics with fatigue  Goal status: INITIAL  6. Patient will maintain L arm swing for at least 3 minutes without cuing in order to demonstrate improved ability to override hypokinetic movement.  Baseline: absent or significantly decreased arm-swing observed 100% of time during 3 minute, continuous walking period Goal status: INITIAL  7.  Pt will improve ability to doff/don both shoes while seated by at least 10 seconds in order to demonstrate improved functional  mobility of BUE and increased ease in dressing. Baseline: 53 seconds Goal status: INITIAL      ASSESSMENT:  CLINICAL IMPRESSION: Pt completed gait outdoors to introduce higher complexity environment. He had difficulty sustaining correct technique throughout, particularly with addition of dual cog task with noted increase overall in  hypokinetic movement. Plan to continue to target this impairment future visits. Pt also initiated training on Korebalance machine to promote better sensory integration for balance.. The pt will benefit from further skilled PT to improve strength, gait, balance, and mobility.    OBJECTIVE IMPAIRMENTS: Abnormal gait, decreased activity tolerance, decreased balance, decreased coordination, decreased endurance, decreased mobility, difficulty walking, decreased strength, impaired UE functional use, improper body mechanics, postural dysfunction, and pain.   ACTIVITY LIMITATIONS: squatting, stairs, bed mobility, dressing, and locomotion level  PARTICIPATION LIMITATIONS: cleaning, community activity, occupation, and yard work  PERSONAL FACTORS: Age, Time since onset of injury/illness/exacerbation, and 3+ comorbidities: PMH significant for rheumatoid arthritis, HTN, hx of shoulder surgery  are also affecting patient's functional outcome.   REHAB POTENTIAL: Good  CLINICAL DECISION MAKING: Evolving/moderate complexity  EVALUATION COMPLEXITY: Moderate  PLAN:  PT FREQUENCY: 2x/week  PT DURATION: 12 weeks  PLANNED INTERVENTIONS: Therapeutic exercises, Therapeutic activity, Neuromuscular re-education, Balance training, Gait training, Patient/Family education, Self Care, Joint mobilization, Stair training, Vestibular training, Canalith repositioning, Visual/preceptual remediation/compensation, Orthotic/Fit training, DME instructions, Electrical stimulation, Wheelchair mobility training, Spinal mobilization, Cryotherapy, Moist heat, Taping, Ultrasound, Manual therapy, and Re-evaluation  PLAN FOR NEXT SESSION: Parkinson's specific exercises, SLB, rock and reach exercises, continue plan   Baird Kay, PT 09/14/2022, 9:20 AM

## 2022-09-18 ENCOUNTER — Encounter: Payer: 59 | Admitting: Speech Pathology

## 2022-09-18 ENCOUNTER — Ambulatory Visit: Payer: 59

## 2022-09-18 DIAGNOSIS — R262 Difficulty in walking, not elsewhere classified: Secondary | ICD-10-CM

## 2022-09-18 DIAGNOSIS — R2681 Unsteadiness on feet: Secondary | ICD-10-CM

## 2022-09-18 DIAGNOSIS — R278 Other lack of coordination: Secondary | ICD-10-CM | POA: Diagnosis not present

## 2022-09-18 DIAGNOSIS — M6281 Muscle weakness (generalized): Secondary | ICD-10-CM

## 2022-09-18 NOTE — Therapy (Signed)
OUTPATIENT PHYSICAL THERAPY NEURO TREATMENT NOTE   Patient Name: Tyler Glover MRN: [id number] DOB:1960-07-05, 62 y.o., male Today's Date: 09/18/2022   PCP:    Elias Else   REFERRING PROVIDER:   Lonell Face, MD    END OF SESSION:  PT End of Session - 09/18/22 1532     Visit Number 9    Number of Visits 25    Date for PT Re-Evaluation 10/04/22    PT Start Time 1534    PT Stop Time 1615    PT Time Calculation (min) 41 min    Equipment Utilized During Treatment Gait belt    Activity Tolerance Patient tolerated treatment well    Behavior During Therapy WFL for tasks assessed/performed                Past Medical History:  Diagnosis Date   Allergy    Arthritis    Rheumatoid   GERD (gastroesophageal reflux disease)    Hypertension    Past Surgical History:  Procedure Laterality Date   COLONOSCOPY     COLONOSCOPY WITH PROPOFOL N/A 08/21/2019   Procedure: COLONOSCOPY WITH PROPOFOL;  Surgeon: Midge Minium, MD;  Location: [facility];  Service: Endoscopy;  Laterality: N/A;  priority 4   ESOPHAGOGASTRODUODENOSCOPY (EGD) WITH PROPOFOL N/A 08/21/2019   Procedure: ESOPHAGOGASTRODUODENOSCOPY (EGD) WITH PROPOFOL;  Surgeon: Midge Minium, MD;  Location: [facility];  Service: Endoscopy;  Laterality: N/A;   POLYPECTOMY  08/21/2019   Procedure: POLYPECTOMY;  Surgeon: Midge Minium, MD;  Location: [facility];  Service: Endoscopy;;   SHOULDER SURGERY     TESTICLE SURGERY     age 70 or 65   TONSILLECTOMY     Patient Active Problem List   Diagnosis Date Noted   Encounter for screening colonoscopy    Polyp of transverse colon    Heartburn    Pure hypercholesterolemia 01/26/2016   Right lateral epicondylitis 07/07/2015   Vitamin D deficiency 12/24/2014   Prediabetes 12/24/2014   Allergic rhinitis, seasonal 06/03/2014   Acid reflux 06/03/2014   Essential (primary) hypertension 06/03/2014   Family history of diabetes mellitus 06/03/2014    Family history of cardiac disorder 06/03/2014   Obesity (BMI 30.0-34.9) 06/03/2014    ONSET DATE: 02/13/2020  REFERRING DIAG: G20.C (ICD-10-CM) - Parkinsonism   THERAPY DIAG:  Other lack of coordination  Difficulty in walking, not elsewhere classified  Muscle weakness (generalized)  Unsteadiness on feet  Rationale for Evaluation and Treatment: Rehabilitation  SUBJECTIVE:  SUBJECTIVE STATEMENT: Pt reports he has been pretty good. He has been working in the heat, has been doing OK, but feeling tired.  Pt accompanied by: self  PERTINENT HISTORY:   Pt is a pleasant 62 yo male here for impairments related to Parkinsonism. The pt was diagnosed approx 2 years ago. He returns after absence following completion of LSVT BIG and is currently in speech for Parkinsonism-related impairments. He reports since completing LSVT BIG his movements have improved overall but he has experienced some decline since discharge. Pt reports he is having the most difficulty throughout L side, particularly L arm and ankle. He is also concerned about decreased strength and gait abnormality.  PMH significant for rheumatoid arthritis, HTN, hx of shoulder surgery  PAIN:  Are you having pain? Yes: NPRS scale: 3.5/10 Pain location: LBP Pain description: across low back Aggravating factors: sharp Relieving factors: laying down  PRECAUTIONS: Fall  WEIGHT BEARING RESTRICTIONS: No  FALLS: Has patient fallen in last 6 months? No  LIVING ENVIRONMENT: Lives with: lives with their family and lives with their spouse Lives in: House/apartment Stairs: Yes: Internal: >10 steps; not reported   PLOF: Independent  PATIENT GOALS:   Strengthening my legs and my arms, improving walking, particularly arm-swing, getting out of the car  more quickly after I have been driving for a while  OBJECTIVE:   DIAGNOSTIC FINDINGS:   Via chart 12/29/2021:  "MPRESSION: 1. Normal brain MRI. 2. NeuroQuant volumetric analysis of the brain, see details on [employer].     Electronically Signed   By: Tiburcio Pea M.D.   On: 01/01/2022 05:55"  COGNITION: Overall cognitive status: Within functional limits for tasks assessed   SENSATION: WFL In tact to light touch BLE   COORDINATION: Impaired primarily L side  EDEMA:  None observed or reported     POSTURE: forward head and increased thoracic kyphosis  LOWER EXTREMITY MMT:    LE strength grossly 4+/5 bilaterally, most deficits found LLE    TRANSFERS: Assistive device utilized: None  Sit to stand: Complete Independence however with decreased speed (see 5xSTS)   STAIRS: Level of Assistance: Modified independence Stair Negotiation Technique: Alternating Pattern  with Bilateral Rails Number of Stairs: 5-6  Pt reports more steps at home, can be tiring   GAIT: Gait pattern:  Decreased arm-swing bilat, however, L more severe than R and absent at times. Pt also with decreased heel strike LLE, at times exhibits festination with fatigue Distance walked: see for total distance Assistive device utilized: None Level of assistance: SBA   FUNCTIONAL TESTS:  5 times sit to stand: 13 seconds hands-free 6 minute walk test: 1241 ft and fatiguing. Worsening of gait mechanics with fatigue  10 meter walk test: 1.4 m/s  Berg Balance Scale: 49/56 Dynamic Gait Index: 19/24  Don and doff both shoes (sneakers): 53 seconds   TODAY'S TREATMENT:  DATE:  09/18/22  Gait belt donned and CGA provided throughout unless specified otherwise   TE: Treadmill endurance training, ambulates up to 1.9 mph x 5 minutes. Cuing throughout for heel-strike and big  steps bilat. PT adjusts intensity throughout and monitors pt for response.   Postural strengthening: Matrix cable machine rows: 7.5# 10x bilat UE 22.5# 2x10 Bilat UE. Fatiguing Matrix cable machine oblique twists: 2.5# 2x10 each way   NMR:  Large-amplitude movement training with 1.5# wrist weights: on blue mat for compliant surface  Large amplitude LTL step with BUE shoulder abduction 15x each way. Then repeats 15x just to L side (most difficult for pt). Requires rest break between sets  Standing twist and clap with trunk rotation 2x10 each way. Pt reports very challenging "just about all of it." Lunge position with forward/backward rock with alt UE arm-swing 15x each LE position. Rates medium. Improved LUE shoulder extension.  Ambulate with large-amplitude movement: arm-swing, upright posture, and heel-toe sequencing with larger steps x multiple rounds. First round focus on posture, second round focused on posture and arm-swing, remaining rounds focused on posture, arm-swing and steps/heel-toe sequencing. Improved ability to sustain arm-swing throughout.  -Don and doffing shoes using large-amplitude movement and finger flicks (5-10x bilat). Wearing sneakers  Trial 1:  42 seconds  Trial 2: 45  seconds    PATIENT EDUCATION: Education details: Pt educated throughout session about proper posture and technique with exercises. Improved exercise technique, movement at target joints, use of target muscles after min to mod verbal, visual, tactile cues.  Person educated: Patient Education method: Explanation, Demonstration, Tactile cues, and Verbal cues Education comprehension: verbalized understanding, returned demonstration, verbal cues required, tactile cues required, and needs further education    HOME EXERCISE PROGRAM: 07/23/22: issued as printout: Standing and swinging your arms (alternating) -focus on swinging left arm behind you.  Perform this 6 days/week 3 sets of 20 reps. Sit to  stand with arms out. Perform 5-6 days/week for 3 sets of 10 reps.   GOALS: Goals reviewed with patient? Yes   SHORT TERM GOALS: Target date: 08/23/2022    Patient will be independent in home exercise program to improve strength/mobility for better functional independence with ADLs. Baseline: to be initiated  Goal status: INITIAL   LONG TERM GOALS: Target date: 10/04/2022   Patient will increase FOTO score to equal to or greater than     to demonstrate statistically significant improvement in mobility and quality of life.  Baseline:  Goal status: INITIAL  2.  Patient (< 19 years old) will complete five times sit to stand test in < 10 seconds indicating an increased LE strength and improved balance. Baseline: 13 seconds  Goal status: INITIAL  3.  Patient will increase Berg Balance score by > 6 points to demonstrate decreased fall risk during functional activities Baseline: 49/56 Goal status: INITIAL   4.  Patient will increase dynamic gait index score to >20/24 as to demonstrate reduced fall risk and improved dynamic gait balance for better safety with community/home ambulation.   Baseline: 19/24 Goal status: INITIAL  5.Patient will increase six minute walk test distance to >1300 for improve gait ability and endurance with community activities.  Baseline: 1241 ft and fatiguing. Worsening of gait mechanics with fatigue  Goal status: INITIAL  6. Patient will maintain L arm swing for at least 3 minutes without cuing in order to demonstrate improved ability to override hypokinetic movement.  Baseline: absent or significantly decreased arm-swing observed 100% of time during 3  minute, continuous walking period Goal status: INITIAL  7.  Pt will improve ability to doff/don both shoes while seated by at least 10 seconds in order to demonstrate improved functional mobility of BUE and increased ease in dressing. Baseline: 53 seconds Goal status: INITIAL       ASSESSMENT:  CLINICAL IMPRESSION: Initiated cardio/endurance focused training and postural strengthening to address deficits found with PD-specific exercises. Pt fatigues throughout but highly motivated to participate in interventions. He does show improvement in sustaining L shoulder ext with arm-swing today. The pt will benefit from further skilled PT to improve strength, gait, balance, and mobility.    OBJECTIVE IMPAIRMENTS: Abnormal gait, decreased activity tolerance, decreased balance, decreased coordination, decreased endurance, decreased mobility, difficulty walking, decreased strength, impaired UE functional use, improper body mechanics, postural dysfunction, and pain.   ACTIVITY LIMITATIONS: squatting, stairs, bed mobility, dressing, and locomotion level  PARTICIPATION LIMITATIONS: cleaning, community activity, occupation, and yard work  PERSONAL FACTORS: Age, Time since onset of injury/illness/exacerbation, and 3+ comorbidities: PMH significant for rheumatoid arthritis, HTN, hx of shoulder surgery  are also affecting patient's functional outcome.   REHAB POTENTIAL: Good  CLINICAL DECISION MAKING: Evolving/moderate complexity  EVALUATION COMPLEXITY: Moderate  PLAN:  PT FREQUENCY: 2x/week  PT DURATION: 12 weeks  PLANNED INTERVENTIONS: Therapeutic exercises, Therapeutic activity, Neuromuscular re-education, Balance training, Gait training, Patient/Family education, Self Care, Joint mobilization, Stair training, Vestibular training, Canalith repositioning, Visual/preceptual remediation/compensation, Orthotic/Fit training, DME instructions, Electrical stimulation, Wheelchair mobility training, Spinal mobilization, Cryotherapy, Moist heat, Taping, Ultrasound, Manual therapy, and Re-evaluation  PLAN FOR NEXT SESSION: Parkinson's specific exercises, SLB, rock and reach exercises, continue plan   Baird Kay, PT 09/18/2022, 4:24 PM

## 2022-09-20 ENCOUNTER — Ambulatory Visit: Payer: 59

## 2022-09-20 ENCOUNTER — Encounter: Payer: 59 | Admitting: Speech Pathology

## 2022-09-20 ENCOUNTER — Ambulatory Visit: Payer: 59 | Admitting: Speech Pathology

## 2022-09-20 ENCOUNTER — Telehealth: Payer: Self-pay

## 2022-09-20 DIAGNOSIS — R2681 Unsteadiness on feet: Secondary | ICD-10-CM

## 2022-09-20 DIAGNOSIS — R278 Other lack of coordination: Secondary | ICD-10-CM

## 2022-09-20 DIAGNOSIS — R262 Difficulty in walking, not elsewhere classified: Secondary | ICD-10-CM

## 2022-09-20 NOTE — Telephone Encounter (Signed)
Pt no showed for ST appointment on 8/8. Contacted pt via telephone. He reported having his times "mixed up," and he was planning to attend PT at 0930. No follow up SLP appointments are scheduled at this time. Pt to schedule with front desk.   Clyde Canterbury, M.S., CCC-SLP Speech-Language Pathologist [facility] [phone] (ASCOM)

## 2022-09-20 NOTE — Therapy (Signed)
OUTPATIENT PHYSICAL THERAPY NEURO TREATMENT NOTE/Physical Therapy Progress Note   Dates of reporting period  07/02/2022   to   09/20/2022    Patient Name: Tyler Glover MRN: [id number] DOB:[birth year], 62 y.o., male Today's Date: 09/20/2022   PCP:    Elias Else   REFERRING PROVIDER:   Lonell Face, MD    END OF SESSION:  PT End of Session - 09/20/22 0935     Visit Number 10    Number of Visits 25    Date for PT Re-Evaluation 10/04/22    PT Start Time 0936    PT Stop Time 1011    PT Time Calculation (min) 35 min    Equipment Utilized During Treatment Gait belt    Activity Tolerance Patient tolerated treatment well    Behavior During Therapy WFL for tasks assessed/performed                Past Medical History:  Diagnosis Date   Allergy    Arthritis    Rheumatoid   GERD (gastroesophageal reflux disease)    Hypertension    Past Surgical History:  Procedure Laterality Date   COLONOSCOPY     COLONOSCOPY WITH PROPOFOL N/A 08/21/2019   Procedure: COLONOSCOPY WITH PROPOFOL;  Surgeon: Midge Minium, MD;  Location: [facility];  Service: Endoscopy;  Laterality: N/A;  priority 4   ESOPHAGOGASTRODUODENOSCOPY (EGD) WITH PROPOFOL N/A 08/21/2019   Procedure: ESOPHAGOGASTRODUODENOSCOPY (EGD) WITH PROPOFOL;  Surgeon: Midge Minium, MD;  Location: [facility];  Service: Endoscopy;  Laterality: N/A;   POLYPECTOMY  08/21/2019   Procedure: POLYPECTOMY;  Surgeon: Midge Minium, MD;  Location: [facility];  Service: Endoscopy;;   SHOULDER SURGERY     TESTICLE SURGERY     age 62 or 62   TONSILLECTOMY     Patient Active Problem List   Diagnosis Date Noted   Encounter for screening colonoscopy    Polyp of transverse colon    Heartburn    Pure hypercholesterolemia 01/26/2016   Right lateral epicondylitis 07/07/2015   Vitamin D deficiency 12/24/2014   Prediabetes 12/24/2014   Allergic rhinitis, seasonal 06/03/2014   Acid reflux 06/03/2014    Essential (primary) hypertension 06/03/2014   Family history of diabetes mellitus 06/03/2014   Family history of cardiac disorder 06/03/2014   Obesity (BMI 30.0-34.9) 06/03/2014    ONSET DATE: 02/13/2020  REFERRING DIAG: G20.C (ICD-10-CM) - Parkinsonism   THERAPY DIAG:  Unsteadiness on feet  Other lack of coordination  Difficulty in walking, not elsewhere classified  Rationale for Evaluation and Treatment: Rehabilitation  SUBJECTIVE:  SUBJECTIVE STATEMENT: Pt reports he has been pretty good. He has been working in the heat, has been doing OK, but feeling tired.  Pt accompanied by: self  PERTINENT HISTORY:   Pt is a pleasant 62 yo male here for impairments related to Parkinsonism. The pt was diagnosed approx 2 years ago. He returns after absence following completion of LSVT BIG and is currently in speech for Parkinsonism-related impairments. He reports since completing LSVT BIG his movements have improved overall but he has experienced some decline since discharge. Pt reports he is having the most difficulty throughout L side, particularly L arm and ankle. He is also concerned about decreased strength and gait abnormality.  PMH significant for rheumatoid arthritis, HTN, hx of shoulder surgery  PAIN:  Are you having pain? Yes: NPRS scale: 3.5/10 Pain location: LBP Pain description: across low back Aggravating factors: sharp Relieving factors: laying down  PRECAUTIONS: Fall  WEIGHT BEARING RESTRICTIONS: No  FALLS: Has patient fallen in last 6 months? No  LIVING ENVIRONMENT: Lives with: lives with their family and lives with their spouse Lives in: House/apartment Stairs: Yes: Internal: >10 steps; not reported   PLOF: Independent  PATIENT GOALS:   Strengthening my legs and my arms,  improving walking, particularly arm-swing, getting out of the car more quickly after I have been driving for a while  OBJECTIVE:   DIAGNOSTIC FINDINGS:   Via chart 12/29/2021:  "MPRESSION: 1. Normal brain MRI. 2. NeuroQuant volumetric analysis of the brain, see details on [employer].     Electronically Signed   By: Tiburcio Pea M.D.   On: 01/01/2022 05:55"  COGNITION: Overall cognitive status: Within functional limits for tasks assessed   SENSATION: WFL In tact to light touch BLE   COORDINATION: Impaired primarily L side  EDEMA:  None observed or reported     POSTURE: forward head and increased thoracic kyphosis  LOWER EXTREMITY MMT:    LE strength grossly 4+/5 bilaterally, most deficits found LLE    TRANSFERS: Assistive device utilized: None  Sit to stand: Complete Independence however with decreased speed (see 5xSTS)   STAIRS: Level of Assistance: Modified independence Stair Negotiation Technique: Alternating Pattern  with Bilateral Rails Number of Stairs: 5-6  Pt reports more steps at home, can be tiring   GAIT: Gait pattern:  Decreased arm-swing bilat, however, L more severe than R and absent at times. Pt also with decreased heel strike LLE, at times exhibits festination with fatigue Distance walked: see for total distance Assistive device utilized: None Level of assistance: SBA   FUNCTIONAL TESTS:  5 times sit to stand: 13 seconds hands-free 6 minute walk test: 1241 ft and fatiguing. Worsening of gait mechanics with fatigue  10 meter walk test: 1.4 m/s  Berg Balance Scale: 49/56 Dynamic Gait Index: 19/24  Don and doff both shoes (sneakers): 53 seconds   TODAY'S TREATMENT:  DATE:  09/20/22  Gait belt donned and CGA provided throughout unless specified otherwise   TA: Goal reassessment completed on this date.  Please refer to goal section for details       PATIENT EDUCATION: Education details: Pt educated throughout session about proper posture and technique with exercises. Improved exercise technique, movement at target joints, use of target muscles after min to mod verbal, visual, tactile cues.  Person educated: Patient Education method: Explanation, Demonstration, Tactile cues, and Verbal cues Education comprehension: verbalized understanding, returned demonstration, verbal cues required, tactile cues required, and needs further education    HOME EXERCISE PROGRAM: 07/23/22: issued as printout: Standing and swinging your arms (alternating) -focus on swinging left arm behind you.  Perform this 6 days/week 3 sets of 20 reps. Sit to stand with arms out. Perform 5-6 days/week for 3 sets of 10 reps.   GOALS: Goals reviewed with patient? Yes   SHORT TERM GOALS: Target date: 08/23/2022    Patient will be independent in home exercise program to improve strength/mobility for better functional independence with ADLs. Baseline: to be initiated  Goal status: INITIAL   LONG TERM GOALS: Target date: 10/04/2022   Patient will increase FOTO score to equal to or greater than     to demonstrate statistically significant improvement in mobility and quality of life.  Baseline:  Goal status: INITIAL/DISCONTINUED, previously completed with LSVT  2.  Patient (< 82 years old) will complete five times sit to stand test in < 10 seconds indicating an increased LE strength and improved balance. Baseline: 13 seconds; 09/20/22:9 sec hands-free Goal status: MET  3.  Patient will increase Berg Balance score by > 6 points to demonstrate decreased fall risk during functional activities Baseline: 49/56; 09/20/22: 55/56 - lost a point with standing EC  Goal status: PARTIALLY MET   4.  Patient will increase dynamic gait index score to >20/24 as to demonstrate reduced fall risk and improved dynamic gait balance for  better safety with community/home ambulation.   Baseline: 19/24; 09/20/22: 21/24 Goal status: MET  5.Patient will increase six minute walk test distance to >1300 for improve gait ability and endurance with community activities.  Baseline: 1241 ft and fatiguing. Worsening of gait mechanics with fatigue; 09/20/22:1588 ft  Goal status: MET  6. Patient will maintain L arm swing for at least 3 minutes without cuing in order to demonstrate improved ability to override hypokinetic movement.  Baseline: absent or significantly decreased arm-swing observed 100% of time during 3 minute, continuous walking period; 09/20/22: Pt requires cuing <30% of the time, improved from initial assessment Goal status: PARTIALLY MET  7.  Pt will improve ability to doff/don both shoes while seated by at least 10 seconds in order to demonstrate improved functional mobility of BUE and increased ease in dressing. Baseline: 53 seconds; 09/21/22: 40 seconds Goal status: MET      ASSESSMENT:  CLINICAL IMPRESSION: Goal reassessment completed for progress note. Pt making gains AEB meeting or partially meeting all LTG. Pt remaining goals to meet include arm-swing goal and BERG goal, where deficits were with L arm swing and balance with EC. Patient's condition has the potential to improve in response to therapy. Maximum improvement is yet to be obtained. The anticipated improvement is attainable and reasonable in a generally predictable time. The pt will benefit from further skilled PT to improve strength, gait, balance, and mobility.    OBJECTIVE IMPAIRMENTS: Abnormal gait, decreased activity tolerance, decreased balance, decreased coordination, decreased endurance, decreased mobility, difficulty  walking, decreased strength, impaired UE functional use, improper body mechanics, postural dysfunction, and pain.   ACTIVITY LIMITATIONS: squatting, stairs, bed mobility, dressing, and locomotion level  PARTICIPATION LIMITATIONS:  cleaning, community activity, occupation, and yard work  PERSONAL FACTORS: Age, Time since onset of injury/illness/exacerbation, and 3+ comorbidities: PMH significant for rheumatoid arthritis, HTN, hx of shoulder surgery  are also affecting patient's functional outcome.   REHAB POTENTIAL: Good  CLINICAL DECISION MAKING: Evolving/moderate complexity  EVALUATION COMPLEXITY: Moderate  PLAN:  PT FREQUENCY: 2x/week  PT DURATION: 12 weeks  PLANNED INTERVENTIONS: Therapeutic exercises, Therapeutic activity, Neuromuscular re-education, Balance training, Gait training, Patient/Family education, Self Care, Joint mobilization, Stair training, Vestibular training, Canalith repositioning, Visual/preceptual remediation/compensation, Orthotic/Fit training, DME instructions, Electrical stimulation, Wheelchair mobility training, Spinal mobilization, Cryotherapy, Moist heat, Taping, Ultrasound, Manual therapy, and Re-evaluation  PLAN FOR NEXT SESSION: Parkinson's specific exercises, SLB, rock and reach exercises, continue plan   Baird Kay, PT 09/20/2022, 10:16 AM

## 2022-09-24 ENCOUNTER — Encounter: Payer: 59 | Admitting: Speech Pathology

## 2022-09-24 ENCOUNTER — Ambulatory Visit: Payer: 59

## 2022-09-25 ENCOUNTER — Ambulatory Visit: Payer: 59

## 2022-09-25 ENCOUNTER — Encounter: Payer: 59 | Admitting: Speech Pathology

## 2022-09-26 ENCOUNTER — Ambulatory Visit: Payer: 59 | Admitting: Physical Therapy

## 2022-09-26 ENCOUNTER — Encounter: Payer: 59 | Admitting: Speech Pathology

## 2022-09-27 ENCOUNTER — Ambulatory Visit: Payer: 59

## 2022-09-27 ENCOUNTER — Encounter: Payer: 59 | Admitting: Speech Pathology

## 2022-10-02 ENCOUNTER — Ambulatory Visit: Payer: 59

## 2022-10-02 ENCOUNTER — Encounter: Payer: 59 | Admitting: Speech Pathology

## 2022-10-04 ENCOUNTER — Encounter: Payer: 59 | Admitting: Speech Pathology

## 2022-10-04 ENCOUNTER — Ambulatory Visit: Payer: 59

## 2022-10-04 DIAGNOSIS — M6281 Muscle weakness (generalized): Secondary | ICD-10-CM

## 2022-10-04 DIAGNOSIS — R278 Other lack of coordination: Secondary | ICD-10-CM | POA: Diagnosis not present

## 2022-10-04 DIAGNOSIS — R262 Difficulty in walking, not elsewhere classified: Secondary | ICD-10-CM

## 2022-10-04 NOTE — Therapy (Signed)
OUTPATIENT PHYSICAL THERAPY NEURO TREATMENT NOTE  Patient Name: Tyler Glover MRN: [id number] DOB:[birth year], 62 y.o., male Today's Date: 10/04/2022   PCP:    Elias Else   REFERRING PROVIDER:   Lonell Face, MD    END OF SESSION:  PT End of Session - 10/04/22 1617     Visit Number 11    Number of Visits 25    Date for PT Re-Evaluation 10/04/22    PT Start Time 1621    PT Stop Time 1701    PT Time Calculation (min) 40 min    Equipment Utilized During Treatment Gait belt    Activity Tolerance Patient tolerated treatment well    Behavior During Therapy WFL for tasks assessed/performed                 Past Medical History:  Diagnosis Date   Allergy    Arthritis    Rheumatoid   GERD (gastroesophageal reflux disease)    Hypertension    Past Surgical History:  Procedure Laterality Date   COLONOSCOPY     COLONOSCOPY WITH PROPOFOL N/A 08/21/2019   Procedure: COLONOSCOPY WITH PROPOFOL;  Surgeon: Midge Minium, MD;  Location: [facility];  Service: Endoscopy;  Laterality: N/A;  priority 4   ESOPHAGOGASTRODUODENOSCOPY (EGD) WITH PROPOFOL N/A 08/21/2019   Procedure: ESOPHAGOGASTRODUODENOSCOPY (EGD) WITH PROPOFOL;  Surgeon: Midge Minium, MD;  Location: [facility];  Service: Endoscopy;  Laterality: N/A;   POLYPECTOMY  08/21/2019   Procedure: POLYPECTOMY;  Surgeon: Midge Minium, MD;  Location: [facility];  Service: Endoscopy;;   SHOULDER SURGERY     TESTICLE SURGERY     age 64 or 53   TONSILLECTOMY     Patient Active Problem List   Diagnosis Date Noted   Encounter for screening colonoscopy    Polyp of transverse colon    Heartburn    Pure hypercholesterolemia 01/26/2016   Right lateral epicondylitis 07/07/2015   Vitamin D deficiency 12/24/2014   Prediabetes 12/24/2014   Allergic rhinitis, seasonal 06/03/2014   Acid reflux 06/03/2014   Essential (primary) hypertension 06/03/2014   Family history of diabetes mellitus 06/03/2014    Family history of cardiac disorder 06/03/2014   Obesity (BMI 30.0-34.9) 06/03/2014    ONSET DATE: 02/13/2020  REFERRING DIAG: G20.C (ICD-10-CM) - Parkinsonism   THERAPY DIAG:  Other lack of coordination  Muscle weakness (generalized)  Difficulty in walking, not elsewhere classified  Rationale for Evaluation and Treatment: Rehabilitation  SUBJECTIVE:  SUBJECTIVE STATEMENT: Pt reports he has been practicing techniques taught in PT since last seen. He feels his left ankle is tighter than his R. He feels his L side from UE to LE is not quite as strong.  Pt accompanied by: self  PERTINENT HISTORY:   Pt is a pleasant 62 yo male here for impairments related to Parkinsonism. The pt was diagnosed approx 2 years ago. He returns after absence following completion of LSVT BIG and is currently in speech for Parkinsonism-related impairments. He reports since completing LSVT BIG his movements have improved overall but he has experienced some decline since discharge. Pt reports he is having the most difficulty throughout L side, particularly L arm and ankle. He is also concerned about decreased strength and gait abnormality.  PMH significant for rheumatoid arthritis, HTN, hx of shoulder surgery  PAIN:  Are you having pain? Yes: NPRS scale: 3.5/10 Pain location: LBP Pain description: across low back Aggravating factors: sharp Relieving factors: laying down  PRECAUTIONS: Fall  WEIGHT BEARING RESTRICTIONS: No  FALLS: Has patient fallen in last 6 months? No  LIVING ENVIRONMENT: Lives with: lives with their family and lives with their spouse Lives in: House/apartment Stairs: Yes: Internal: >10 steps; not reported   PLOF: Independent  PATIENT GOALS:   Strengthening my legs and my arms, improving  walking, particularly arm-swing, getting out of the car more quickly after I have been driving for a while  OBJECTIVE:   DIAGNOSTIC FINDINGS:   Via chart 12/29/2021:  "MPRESSION: 1. Normal brain MRI. 2. NeuroQuant volumetric analysis of the brain, see details on [employer].     Electronically Signed   By: Tiburcio Pea M.D.   On: 01/01/2022 05:55"  COGNITION: Overall cognitive status: Within functional limits for tasks assessed   SENSATION: WFL In tact to light touch BLE   COORDINATION: Impaired primarily L side  EDEMA:  None observed or reported     POSTURE: forward head and increased thoracic kyphosis  LOWER EXTREMITY MMT:    LE strength grossly 4+/5 bilaterally, most deficits found LLE    TRANSFERS: Assistive device utilized: None  Sit to stand: Complete Independence however with decreased speed (see 5xSTS)   STAIRS: Level of Assistance: Modified independence Stair Negotiation Technique: Alternating Pattern  with Bilateral Rails Number of Stairs: 5-6  Pt reports more steps at home, can be tiring   GAIT: Gait pattern:  Decreased arm-swing bilat, however, L more severe than R and absent at times. Pt also with decreased heel strike LLE, at times exhibits festination with fatigue Distance walked: see for total distance Assistive device utilized: None Level of assistance: SBA   FUNCTIONAL TESTS:  5 times sit to stand: 13 seconds hands-free 6 minute walk test: 1241 ft and fatiguing. Worsening of gait mechanics with fatigue  10 meter walk test: 1.4 m/s  Berg Balance Scale: 49/56 Dynamic Gait Index: 19/24  Don and doff both shoes (sneakers): 53 seconds   TODAY'S TREATMENT:  DATE:  10/04/22  Gait belt donned and CGA provided throughout unless specified otherwise   TE:  Instructed pt in self PROM with DF each ankle: L<R    Standing lunge gastroc stretch x 30 sec each LE (issued for HEP) Standing wedge gastroc stretch 60 sec each LE  Treadmill endurance training, ambulates up to 2.1 mph x 6 minutes. Cuing for heel-strike. PT adjusts intensity throughout and monitors pt for response.    Leg press 25# 10x Leg press 40# 2x5 Comments: difficulty initiating knee flexion component to bring press back due to brady/hypokinesia   Postural strengthening: Matrix cable machine rows: 22.5# 2x10, 1x5 Bilat UE. Fatiguing Matrix cable machine oblique twists: 2.5# 2x10 each way  Matrix cable machine serratus punches with 2.5# 2x10 each UE. Cuing to increase speed/amplitude of movement L side      PATIENT EDUCATION: Education details: Pt educated throughout session about proper posture and technique with exercises. Improved exercise technique, movement at target joints, use of target muscles after min to mod verbal, visual, tactile cues.  Person educated: Patient Education method: Explanation, Demonstration, Tactile cues, and Verbal cues Education comprehension: verbalized understanding, returned demonstration, verbal cues required, tactile cues required, and needs further education    HOME EXERCISE PROGRAM:  10/04/2022: Access Code: [email or web address] URL: [email or web address] Date: 10/04/2022 Prepared by: Temple Pacini  Exercises - Gastroc Stretch on Wall  - 1 x daily - 7 x weekly - 1 sets - 1 reps - 60 seconds hold  07/23/22: issued as printout: Standing and swinging your arms (alternating) -focus on swinging left arm behind you.  Perform this 6 days/week 3 sets of 20 reps. Sit to stand with arms out. Perform 5-6 days/week for 3 sets of 10 reps.   GOALS: Goals reviewed with patient? Yes   SHORT TERM GOALS: Target date: 08/23/2022    Patient will be independent in home exercise program to improve strength/mobility for better functional independence with ADLs. Baseline: to be initiated  Goal  status: INITIAL   LONG TERM GOALS: Target date: 10/04/2022   Patient will increase FOTO score to equal to or greater than     to demonstrate statistically significant improvement in mobility and quality of life.  Baseline:  Goal status: INITIAL/DISCONTINUED, previously completed with LSVT  2.  Patient (< 32 years old) will complete five times sit to stand test in < 10 seconds indicating an increased LE strength and improved balance. Baseline: 13 seconds; 09/20/22:9 sec hands-free Goal status: MET  3.  Patient will increase Berg Balance score by > 6 points to demonstrate decreased fall risk during functional activities Baseline: 49/56; 09/20/22: 55/56 - lost a point with standing EC  Goal status: PARTIALLY MET   4.  Patient will increase dynamic gait index score to >20/24 as to demonstrate reduced fall risk and improved dynamic gait balance for better safety with community/home ambulation.   Baseline: 19/24; 09/20/22: 21/24 Goal status: MET  5.Patient will increase six minute walk test distance to >1300 for improve gait ability and endurance with community activities.  Baseline: 1241 ft and fatiguing. Worsening of gait mechanics with fatigue; 09/20/22:1588 ft  Goal status: MET  6. Patient will maintain L arm swing for at least 3 minutes without cuing in order to demonstrate improved ability to override hypokinetic movement.  Baseline: absent or significantly decreased arm-swing observed 100% of time during 3 minute, continuous walking period; 09/20/22: Pt requires cuing <30% of the time, improved from initial assessment Goal status: PARTIALLY MET  7.  Pt will improve ability to doff/don both shoes while seated by at least 10 seconds in order to demonstrate improved functional mobility of BUE and increased ease in dressing. Baseline: 53 seconds; 09/21/22: 40 seconds Goal status: MET      ASSESSMENT:  CLINICAL IMPRESSION:   Pt exhibits mobility deficits in L ankle > R ankle. He reported to  PT this is making it more difficult to practice heel-toe sequencing. PT issued gastroc stretch as part of HEP. Remainder of session focused on strength and conditioning interventions with emphasis on increased speed/amplitude of movement.The pt will benefit from further skilled PT to improve strength, gait, balance, and mobility.    OBJECTIVE IMPAIRMENTS: Abnormal gait, decreased activity tolerance, decreased balance, decreased coordination, decreased endurance, decreased mobility, difficulty walking, decreased strength, impaired UE functional use, improper body mechanics, postural dysfunction, and pain.   ACTIVITY LIMITATIONS: squatting, stairs, bed mobility, dressing, and locomotion level  PARTICIPATION LIMITATIONS: cleaning, community activity, occupation, and yard work  PERSONAL FACTORS: Age, Time since onset of injury/illness/exacerbation, and 3+ comorbidities: PMH significant for rheumatoid arthritis, HTN, hx of shoulder surgery  are also affecting patient's functional outcome.   REHAB POTENTIAL: Good  CLINICAL DECISION MAKING: Evolving/moderate complexity  EVALUATION COMPLEXITY: Moderate  PLAN:  PT FREQUENCY: 2x/week  PT DURATION: 12 weeks  PLANNED INTERVENTIONS: Therapeutic exercises, Therapeutic activity, Neuromuscular re-education, Balance training, Gait training, Patient/Family education, Self Care, Joint mobilization, Stair training, Vestibular training, Canalith repositioning, Visual/preceptual remediation/compensation, Orthotic/Fit training, DME instructions, Electrical stimulation, Wheelchair mobility training, Spinal mobilization, Cryotherapy, Moist heat, Taping, Ultrasound, Manual therapy, and Re-evaluation  PLAN FOR NEXT SESSION: Parkinson's specific exercises, SLB, rock and reach exercises, continue plan   Baird Kay, PT, DPT 10/04/2022, 5:06 PM

## 2022-10-09 ENCOUNTER — Ambulatory Visit: Payer: 59

## 2022-10-09 ENCOUNTER — Encounter: Payer: 59 | Admitting: Speech Pathology

## 2022-10-11 ENCOUNTER — Encounter: Payer: 59 | Admitting: Speech Pathology

## 2022-10-11 ENCOUNTER — Ambulatory Visit: Payer: 59

## 2022-10-11 ENCOUNTER — Other Ambulatory Visit: Payer: Self-pay | Admitting: Family Medicine

## 2022-10-11 DIAGNOSIS — I10 Essential (primary) hypertension: Secondary | ICD-10-CM

## 2022-10-11 DIAGNOSIS — E7800 Pure hypercholesterolemia, unspecified: Secondary | ICD-10-CM

## 2022-10-12 NOTE — Telephone Encounter (Signed)
Last RF 07/04/22 #30 5 RF  Requested Prescriptions  Refused Prescriptions Disp Refills   atorvastatin (LIPITOR) 10 MG tablet [Pharmacy Med Name: ATORVASTATIN 10MG  TABLETS] 90 tablet     Sig: TAKE 1 TABLET(10 MG) BY MOUTH DAILY     Cardiovascular:  Antilipid - Statins Failed - 10/11/2022  4:44 PM      Failed - Lipid Panel in normal range within the last 12 months    Cholesterol, Total  Date Value Ref Range Status  07/04/2022 151 100 - 199 mg/dL Final   LDL Chol Calc (NIH)  Date Value Ref Range Status  07/04/2022 82 0 - 99 mg/dL Final   HDL  Date Value Ref Range Status  07/04/2022 55 >39 mg/dL Final   Triglycerides  Date Value Ref Range Status  07/04/2022 71 0 - 149 mg/dL Final         Passed - Patient is not pregnant      Passed - Valid encounter within last 12 months    Recent Outpatient Visits           3 months ago Essential (primary) hypertension   [facility]   9 months ago Essential (primary) hypertension   [facility]   1 year ago COVID   [facility]   1 year ago Acute non-recurrent sinusitis, unspecified location   [facility]   1 year ago Annual physical exam   [facility]       Future Appointments             In 3 months Duanne Limerick, MD [facility], [facility]

## 2022-10-16 ENCOUNTER — Encounter: Payer: 59 | Admitting: Speech Pathology

## 2022-10-16 ENCOUNTER — Ambulatory Visit: Payer: 59 | Attending: Neurology

## 2022-10-16 ENCOUNTER — Ambulatory Visit: Payer: 59

## 2022-10-16 DIAGNOSIS — R262 Difficulty in walking, not elsewhere classified: Secondary | ICD-10-CM | POA: Diagnosis present

## 2022-10-16 DIAGNOSIS — R278 Other lack of coordination: Secondary | ICD-10-CM | POA: Diagnosis present

## 2022-10-16 DIAGNOSIS — M6281 Muscle weakness (generalized): Secondary | ICD-10-CM | POA: Diagnosis present

## 2022-10-16 NOTE — Therapy (Signed)
OUTPATIENT PHYSICAL THERAPY NEURO TREATMENT NOTE/RECERT  Patient Name: Tyler Glover MRN: [id number] DOB:Mar 25, 1960, 62 y.o., male Today's Date: 10/16/2022   PCP:    Elias Else   REFERRING PROVIDER:   Lonell Face, MD    END OF SESSION:  PT End of Session - 10/16/22 1622     Visit Number 12    Number of Visits 24    Date for PT Re-Evaluation 11/27/22    PT Start Time 1626    PT Stop Time 1700    PT Time Calculation (min) 34 min    Equipment Utilized During Treatment Gait belt    Activity Tolerance Patient tolerated treatment well    Behavior During Therapy WFL for tasks assessed/performed                  Past Medical History:  Diagnosis Date   Allergy    Arthritis    Rheumatoid   GERD (gastroesophageal reflux disease)    Hypertension    Past Surgical History:  Procedure Laterality Date   COLONOSCOPY     COLONOSCOPY WITH PROPOFOL N/A 08/21/2019   Procedure: COLONOSCOPY WITH PROPOFOL;  Surgeon: Midge Minium, MD;  Location: [facility];  Service: Endoscopy;  Laterality: N/A;  priority 4   ESOPHAGOGASTRODUODENOSCOPY (EGD) WITH PROPOFOL N/A 08/21/2019   Procedure: ESOPHAGOGASTRODUODENOSCOPY (EGD) WITH PROPOFOL;  Surgeon: Midge Minium, MD;  Location: [facility];  Service: Endoscopy;  Laterality: N/A;   POLYPECTOMY  08/21/2019   Procedure: POLYPECTOMY;  Surgeon: Midge Minium, MD;  Location: [facility];  Service: Endoscopy;;   SHOULDER SURGERY     TESTICLE SURGERY     age 17 or 89   TONSILLECTOMY     Patient Active Problem List   Diagnosis Date Noted   Encounter for screening colonoscopy    Polyp of transverse colon    Heartburn    Pure hypercholesterolemia 01/26/2016   Right lateral epicondylitis 07/07/2015   Vitamin D deficiency 12/24/2014   Prediabetes 12/24/2014   Allergic rhinitis, seasonal 06/03/2014   Acid reflux 06/03/2014   Essential (primary) hypertension 06/03/2014   Family history of diabetes mellitus  06/03/2014   Family history of cardiac disorder 06/03/2014   Obesity (BMI 30.0-34.9) 06/03/2014    ONSET DATE: 02/13/2020  REFERRING DIAG: G20.C (ICD-10-CM) - Parkinsonism   THERAPY DIAG:  Muscle weakness (generalized)  Other lack of coordination  Difficulty in walking, not elsewhere classified  Rationale for Evaluation and Treatment: Rehabilitation  SUBJECTIVE:  SUBJECTIVE STATEMENT: Pt reports continued feelings of weakness in LUE/LLE. Pt has been performing his HEP.  Pt accompanied by: self  PERTINENT HISTORY:   Pt is a pleasant 62 yo male here for impairments related to Parkinsonism. The pt was diagnosed approx 2 years ago. He returns after absence following completion of LSVT BIG and is currently in speech for Parkinsonism-related impairments. He reports since completing LSVT BIG his movements have improved overall but he has experienced some decline since discharge. Pt reports he is having the most difficulty throughout L side, particularly L arm and ankle. He is also concerned about decreased strength and gait abnormality.  PMH significant for rheumatoid arthritis, HTN, hx of shoulder surgery  PAIN:  Are you having pain? Yes: NPRS scale: 3.5/10 Pain location: LBP Pain description: across low back Aggravating factors: sharp Relieving factors: laying down  PRECAUTIONS: Fall  WEIGHT BEARING RESTRICTIONS: No  FALLS: Has patient fallen in last 6 months? No  LIVING ENVIRONMENT: Lives with: lives with their family and lives with their spouse Lives in: House/apartment Stairs: Yes: Internal: >10 steps; not reported   PLOF: Independent  PATIENT GOALS:   Strengthening my legs and my arms, improving walking, particularly arm-swing, getting out of the car more quickly after I have been  driving for a while  OBJECTIVE:   DIAGNOSTIC FINDINGS:   Via chart 12/29/2021:  "MPRESSION: 1. Normal brain MRI. 2. NeuroQuant volumetric analysis of the brain, see details on [employer].     Electronically Signed   By: Tiburcio Pea M.D.   On: 01/01/2022 05:55"  COGNITION: Overall cognitive status: Within functional limits for tasks assessed   SENSATION: WFL In tact to light touch BLE   COORDINATION: Impaired primarily L side  EDEMA:  None observed or reported     POSTURE: forward head and increased thoracic kyphosis  LOWER EXTREMITY MMT:    LE strength grossly 4+/5 bilaterally, most deficits found LLE    TRANSFERS: Assistive device utilized: None  Sit to stand: Complete Independence however with decreased speed (see 5xSTS)   STAIRS: Level of Assistance: Modified independence Stair Negotiation Technique: Alternating Pattern  with Bilateral Rails Number of Stairs: 5-6  Pt reports more steps at home, can be tiring   GAIT: Gait pattern:  Decreased arm-swing bilat, however, L more severe than R and absent at times. Pt also with decreased heel strike LLE, at times exhibits festination with fatigue Distance walked: see for total distance Assistive device utilized: None Level of assistance: SBA   FUNCTIONAL TESTS:  5 times sit to stand: 13 seconds hands-free 6 minute walk test: 1241 ft and fatiguing. Worsening of gait mechanics with fatigue  10 meter walk test: 1.4 m/s  Berg Balance Scale: 49/56 Dynamic Gait Index: 19/24  Don and doff both shoes (sneakers): 53 seconds   TODAY'S TREATMENT:  DATE:  10/16/22  Gait belt donned and CGA provided throughout unless specified otherwise    TA: Goal reassessment completed on this date for recert. Please refer to goal section below for details.  TE: Instructed pt in [employer]  machines to initiate advanced strengthening program/HEP  Leg press: Bilat LE levels 1-2 2x10 LLE only lvl 1 x 10  Hamstring curl lvl 1 10x bilat, LLE only lvl 1 10x Knee ext lvl 1 10x bilat LE, 10x LLE only  Treadmill endurance training up to 2.0 mph and 1% incline x 5 min, cuing throughout for technique/cool-down period, how to use machine/set-up  Attempted kettle bell dead lift with 15#, then 5# weights, however, pt unable to perform without low back discomfort (hx of chronic low back pain). May benefit from reattempts from higher surface   PATIENT EDUCATION: Education details: Pt educated throughout session about proper posture and technique with exercises. Improved exercise technique, movement at target joints, use of target muscles after min to mod verbal, visual, tactile cues. Goals, plan, wellzone machines Person educated: Patient Education method: Explanation, Demonstration, Tactile cues, and Verbal cues Education comprehension: verbalized understanding, returned demonstration, verbal cues required, tactile cues required, and needs further education    HOME EXERCISE PROGRAM:  10/04/2022: Access Code: [email or web address] URL: [email or web address] Date: 10/04/2022 Prepared by: Temple Pacini  Exercises - Gastroc Stretch on Wall  - 1 x daily - 7 x weekly - 1 sets - 1 reps - 60 seconds hold  07/23/22: issued as printout: Standing and swinging your arms (alternating) -focus on swinging left arm behind you.  Perform this 6 days/week 3 sets of 20 reps. Sit to stand with arms out. Perform 5-6 days/week for 3 sets of 10 reps.   GOALS: Goals reviewed with patient? Yes   SHORT TERM GOALS: Target date: 11/13/2022      Patient will be independent in home exercise program and [employer] LE strengthening to improve strength/mobility for better functional independence with ADLs. Baseline: initiated advanced HEP/wellzone strengthening Goal status: REVISED   LONG TERM GOALS:  Target date: 01/08/2023     Patient will increase FOTO score to equal to or greater than     to demonstrate statistically significant improvement in mobility and quality of life.  Baseline:  Goal status: INITIAL/DISCONTINUED, previously completed with LSVT  2.  Patient (< 92 years old) will complete five times sit to stand test in < 10 seconds indicating an increased LE strength and improved balance. Baseline: 13 seconds; 09/20/22:9 sec hands-free Goal status: MET  3.  Patient will increase Berg Balance score by > 6 points to demonstrate decreased fall risk during functional activities Baseline: 49/56; 09/20/22: 55/56 - lost a point with standing EC; 10/16/2022 56/56 Goal status: MET   4.  Patient will increase dynamic gait index score to >20/24 as to demonstrate reduced fall risk and improved dynamic gait balance for better safety with community/home ambulation.   Baseline: 19/24; 09/20/22: 21/24 Goal status: MET  5.Patient will increase six minute walk test distance to >1300 for improve gait ability and endurance with community activities.  Baseline: 1241 ft and fatiguing. Worsening of gait mechanics with fatigue; 09/20/22:1588 ft  Goal status: MET  6. Patient will maintain L arm swing for at least 3 minutes without cuing in order to demonstrate improved ability to override hypokinetic movement.  Baseline: absent or significantly decreased arm-swing observed 100% of time during 3 minute, continuous walking period; 09/20/22: Pt requires cuing <30% of the time, improved from initial assessment; 10/16/2022:  pt continues to require cuing around <30% of the time  Goal status: PARTIALLY MET  7.  Pt will improve ability to doff/don both shoes while seated by at least 10 seconds in order to demonstrate improved functional mobility of BUE and increased ease in dressing. Baseline: 53 seconds; 09/21/22: 40 seconds Goal status: MET      ASSESSMENT:  CLINICAL IMPRESSION:   Session somewhat limited by  pt late arrival. However, pt with excellent motivation to participate during visit.  Goals reassessed and pt has met BERG goal at this time. Pt has not yet met gait/arm-swing goal, and still required cuing approx <30% of time to increase L arm swing. PT also revised HEP goal to include advanced strengthening for LE at [facility] to prepare pt for discharge after end of new certification. The pt will benefit from further skilled PT to improve strength, gait, balance, and mobility.    OBJECTIVE IMPAIRMENTS: Abnormal gait, decreased activity tolerance, decreased balance, decreased coordination, decreased endurance, decreased mobility, difficulty walking, decreased strength, impaired UE functional use, improper body mechanics, postural dysfunction, and pain.   ACTIVITY LIMITATIONS: squatting, stairs, bed mobility, dressing, and locomotion level  PARTICIPATION LIMITATIONS: cleaning, community activity, occupation, and yard work  PERSONAL FACTORS: Age, Time since onset of injury/illness/exacerbation, and 3+ comorbidities: PMH significant for rheumatoid arthritis, HTN, hx of shoulder surgery  are also affecting patient's functional outcome.   REHAB POTENTIAL: Good  CLINICAL DECISION MAKING: Evolving/moderate complexity  EVALUATION COMPLEXITY: Moderate  PLAN:  PT FREQUENCY: 2x/week  PT DURATION: 12 weeks  PLANNED INTERVENTIONS: Therapeutic exercises, Therapeutic activity, Neuromuscular re-education, Balance training, Gait training, Patient/Family education, Self Care, Joint mobilization, Stair training, Vestibular training, Canalith repositioning, Visual/preceptual remediation/compensation, Orthotic/Fit training, DME instructions, Electrical stimulation, Wheelchair mobility training, Spinal mobilization, Cryotherapy, Moist heat, Taping, Ultrasound, Manual therapy, and Re-evaluation  PLAN FOR NEXT SESSION: Parkinson's specific exercises, SLB, rock and reach exercises, [employer] LE and UE strengthening,  focus on LLE/LUE   Baird Kay, PT, DPT 10/16/2022, 5:12 PM

## 2022-10-18 ENCOUNTER — Ambulatory Visit: Payer: 59

## 2022-10-22 ENCOUNTER — Encounter: Payer: 59 | Admitting: Speech Pathology

## 2022-10-22 ENCOUNTER — Ambulatory Visit: Payer: 59

## 2022-10-23 ENCOUNTER — Ambulatory Visit: Payer: 59

## 2022-10-25 ENCOUNTER — Ambulatory Visit: Payer: 59

## 2022-10-25 ENCOUNTER — Encounter: Payer: 59 | Admitting: Speech Pathology

## 2022-10-30 ENCOUNTER — Ambulatory Visit: Payer: 59

## 2022-11-01 ENCOUNTER — Ambulatory Visit: Payer: 59

## 2022-11-05 ENCOUNTER — Ambulatory Visit: Payer: 59

## 2022-11-05 DIAGNOSIS — R278 Other lack of coordination: Secondary | ICD-10-CM

## 2022-11-05 DIAGNOSIS — M6281 Muscle weakness (generalized): Secondary | ICD-10-CM

## 2022-11-05 NOTE — Therapy (Signed)
OUTPATIENT PHYSICAL THERAPY NEURO TREATMENT NOTE  Patient Name: Tyler Glover MRN: [id number] DOB:1960/03/26, 62 y.o., male Today's Date: 11/05/2022   PCP:    Elias Else   REFERRING PROVIDER:   Lonell Face, MD    END OF SESSION:  PT End of Session - 11/05/22 1621     Visit Number 13    Number of Visits 24    Date for PT Re-Evaluation 11/27/22    PT Start Time 1620    PT Stop Time 1659    PT Time Calculation (min) 39 min    Equipment Utilized During Treatment Gait belt    Activity Tolerance Patient tolerated treatment well    Behavior During Therapy WFL for tasks assessed/performed                   Past Medical History:  Diagnosis Date   Allergy    Arthritis    Rheumatoid   GERD (gastroesophageal reflux disease)    Hypertension    Past Surgical History:  Procedure Laterality Date   COLONOSCOPY     COLONOSCOPY WITH PROPOFOL N/A 08/21/2019   Procedure: COLONOSCOPY WITH PROPOFOL;  Surgeon: Midge Minium, MD;  Location: [facility];  Service: Endoscopy;  Laterality: N/A;  priority 4   ESOPHAGOGASTRODUODENOSCOPY (EGD) WITH PROPOFOL N/A 08/21/2019   Procedure: ESOPHAGOGASTRODUODENOSCOPY (EGD) WITH PROPOFOL;  Surgeon: Midge Minium, MD;  Location: [facility];  Service: Endoscopy;  Laterality: N/A;   POLYPECTOMY  08/21/2019   Procedure: POLYPECTOMY;  Surgeon: Midge Minium, MD;  Location: [facility];  Service: Endoscopy;;   SHOULDER SURGERY     TESTICLE SURGERY     age 89 or 61   TONSILLECTOMY     Patient Active Problem List   Diagnosis Date Noted   Encounter for screening colonoscopy    Polyp of transverse colon    Heartburn    Pure hypercholesterolemia 01/26/2016   Right lateral epicondylitis 07/07/2015   Vitamin D deficiency 12/24/2014   Prediabetes 12/24/2014   Allergic rhinitis, seasonal 06/03/2014   Acid reflux 06/03/2014   Essential (primary) hypertension 06/03/2014   Family history of diabetes mellitus  06/03/2014   Family history of cardiac disorder 06/03/2014   Obesity (BMI 30.0-34.9) 06/03/2014    ONSET DATE: 02/13/2020  REFERRING DIAG: G20.C (ICD-10-CM) - Parkinsonism   THERAPY DIAG:  Muscle weakness (generalized)  Other lack of coordination  Rationale for Evaluation and Treatment: Rehabilitation  SUBJECTIVE:  SUBJECTIVE STATEMENT: Pt reports he has been very busy with work has not had time to perform HEP. He reports 1 fall a couple weeks ago following a fall "running up a stair."  He is feeling OK now. He was sore for about a week and a half.  Pt accompanied by: self  PERTINENT HISTORY:   Pt is a pleasant 62 yo male here for impairments related to Parkinsonism. The pt was diagnosed approx 2 years ago. He returns after absence following completion of LSVT BIG and is currently in speech for Parkinsonism-related impairments. He reports since completing LSVT BIG his movements have improved overall but he has experienced some decline since discharge. Pt reports he is having the most difficulty throughout L side, particularly L arm and ankle. He is also concerned about decreased strength and gait abnormality.  PMH significant for rheumatoid arthritis, HTN, hx of shoulder surgery  PAIN:  Are you having pain? Yes: NPRS scale: 3.5/10 Pain location: LBP Pain description: across low back Aggravating factors: sharp Relieving factors: laying down  PRECAUTIONS: Fall  WEIGHT BEARING RESTRICTIONS: No  FALLS: Has patient fallen in last 6 months? No  LIVING ENVIRONMENT: Lives with: lives with their family and lives with their spouse Lives in: House/apartment Stairs: Yes: Internal: >10 steps; not reported   PLOF: Independent  PATIENT GOALS:   Strengthening my legs and my arms, improving walking,  particularly arm-swing, getting out of the car more quickly after I have been driving for a while  OBJECTIVE:   DIAGNOSTIC FINDINGS:   Via chart 12/29/2021:  "MPRESSION: 1. Normal brain MRI. 2. NeuroQuant volumetric analysis of the brain, see details on [employer].     Electronically Signed   By: Tiburcio Pea M.D.   On: 01/01/2022 05:55"  COGNITION: Overall cognitive status: Within functional limits for tasks assessed   SENSATION: WFL In tact to light touch BLE   COORDINATION: Impaired primarily L side  EDEMA:  None observed or reported     POSTURE: forward head and increased thoracic kyphosis  LOWER EXTREMITY MMT:    LE strength grossly 4+/5 bilaterally, most deficits found LLE    TRANSFERS: Assistive device utilized: None  Sit to stand: Complete Independence however with decreased speed (see 5xSTS)   STAIRS: Level of Assistance: Modified independence Stair Negotiation Technique: Alternating Pattern  with Bilateral Rails Number of Stairs: 5-6  Pt reports more steps at home, can be tiring   GAIT: Gait pattern:  Decreased arm-swing bilat, however, L more severe than R and absent at times. Pt also with decreased heel strike LLE, at times exhibits festination with fatigue Distance walked: see for total distance Assistive device utilized: None Level of assistance: SBA   FUNCTIONAL TESTS:  5 times sit to stand: 13 seconds hands-free 6 minute walk test: 1241 ft and fatiguing. Worsening of gait mechanics with fatigue  10 meter walk test: 1.4 m/s  Berg Balance Scale: 49/56 Dynamic Gait Index: 19/24  Don and doff both shoes (sneakers): 53 seconds   TODAY'S TREATMENT:  DATE:  11/05/22  Gait belt donned and CGA provided throughout unless specified otherwise  TE: [employer] LE and UE strengthening:  Pt completes the following  machines/interventions using resistance levels ranging from 1-5 with 10-15 reps per intervention  Cable machine: Pec fly bilat UE and LUE only Standing rows bilat UE and LUE only Lat pull down bilat UE -discontinued due to some discomfort felt in mid back [employer] completes each side Comments: emphasis throughout on increased speed of movement with LUE  Leg press: Bilat LE then LLE only Leg press heel raise: Bilat LE then LLE only with increased TC/VC to improve technique throughout  Hamstring curl bilat LE  Hamstring curl LLE only Knee ext bilat LE  Knee ext LLE only  Other interventions- Standing wall push-up 10x bilat UE Standing wall push-up LUE bias 10x   PATIENT EDUCATION: Education details: Pt educated throughout session about proper posture and technique with exercises. Improved exercise technique, movement at target joints, use of target muscles after min to mod verbal, visual, tactile cues wellzone machines Person educated: Patient Education method: Explanation, Demonstration, Tactile cues, and Verbal cues Education comprehension: verbalized understanding, returned demonstration, verbal cues required, tactile cues required, and needs further education    HOME EXERCISE PROGRAM:  10/04/2022: Access Code: [email or web address] URL: [email or web address] Date: 10/04/2022 Prepared by: Temple Pacini  Exercises - Gastroc Stretch on Wall  - 1 x daily - 7 x weekly - 1 sets - 1 reps - 60 seconds hold  07/23/22: issued as printout: Standing and swinging your arms (alternating) -focus on swinging left arm behind you.  Perform this 6 days/week 3 sets of 20 reps. Sit to stand with arms out. Perform 5-6 days/week for 3 sets of 10 reps.   GOALS: Goals reviewed with patient? Yes   SHORT TERM GOALS: Target date: 11/13/2022      Patient will be independent in home exercise program and [employer] LE strengthening to improve strength/mobility for better functional  independence with ADLs. Baseline: initiated advanced HEP/wellzone strengthening Goal status: REVISED   LONG TERM GOALS: Target date: 01/08/2023     Patient will increase FOTO score to equal to or greater than     to demonstrate statistically significant improvement in mobility and quality of life.  Baseline:  Goal status: INITIAL/DISCONTINUED, previously completed with LSVT  2.  Patient (< 37 years old) will complete five times sit to stand test in < 10 seconds indicating an increased LE strength and improved balance. Baseline: 13 seconds; 09/20/22:9 sec hands-free Goal status: MET  3.  Patient will increase Berg Balance score by > 6 points to demonstrate decreased fall risk during functional activities Baseline: 49/56; 09/20/22: 55/56 - lost a point with standing EC; 10/16/2022 56/56 Goal status: MET   4.  Patient will increase dynamic gait index score to >20/24 as to demonstrate reduced fall risk and improved dynamic gait balance for better safety with community/home ambulation.   Baseline: 19/24; 09/20/22: 21/24 Goal status: MET  5.Patient will increase six minute walk test distance to >1300 for improve gait ability and endurance with community activities.  Baseline: 1241 ft and fatiguing. Worsening of gait mechanics with fatigue; 09/20/22:1588 ft  Goal status: MET  6. Patient will maintain L arm swing for at least 3 minutes without cuing in order to demonstrate improved ability to override hypokinetic movement.  Baseline: absent or significantly decreased arm-swing observed 100% of time during 3 minute, continuous walking period; 09/20/22: Pt requires cuing <30% of the time, improved from initial  assessment; 10/16/2022: pt continues to require cuing around <30% of the time  Goal status: PARTIALLY MET  7.  Pt will improve ability to doff/don both shoes while seated by at least 10 seconds in order to demonstrate improved functional mobility of BUE and increased ease in dressing. Baseline:  53 seconds; 09/21/22: 40 seconds Goal status: MET      ASSESSMENT:  CLINICAL IMPRESSION:   Continued education and instruction in use of [employer] strengthening machines to target BLE/BUE and isolated LUE/LLE. Pt able to return demo of correct technique during visit following cuing. The pt will benefit from further skilled PT to improve strength, gait, balance, and mobility.    OBJECTIVE IMPAIRMENTS: Abnormal gait, decreased activity tolerance, decreased balance, decreased coordination, decreased endurance, decreased mobility, difficulty walking, decreased strength, impaired UE functional use, improper body mechanics, postural dysfunction, and pain.   ACTIVITY LIMITATIONS: squatting, stairs, bed mobility, dressing, and locomotion level  PARTICIPATION LIMITATIONS: cleaning, community activity, occupation, and yard work  PERSONAL FACTORS: Age, Time since onset of injury/illness/exacerbation, and 3+ comorbidities: PMH significant for rheumatoid arthritis, HTN, hx of shoulder surgery  are also affecting patient's functional outcome.   REHAB POTENTIAL: Good  CLINICAL DECISION MAKING: Evolving/moderate complexity  EVALUATION COMPLEXITY: Moderate  PLAN:  PT FREQUENCY: 2x/week  PT DURATION: 12 weeks  PLANNED INTERVENTIONS: Therapeutic exercises, Therapeutic activity, Neuromuscular re-education, Balance training, Gait training, Patient/Family education, Self Care, Joint mobilization, Stair training, Vestibular training, Canalith repositioning, Visual/preceptual remediation/compensation, Orthotic/Fit training, DME instructions, Electrical stimulation, Wheelchair mobility training, Spinal mobilization, Cryotherapy, Moist heat, Taping, Ultrasound, Manual therapy, and Re-evaluation  PLAN FOR NEXT SESSION: Parkinson's specific exercises, SLB, rock and reach exercises, [employer] LE and UE strengthening, focus on LLE/LUE   Baird Kay, PT, DPT 11/05/2022, 5:07 PM

## 2022-11-06 ENCOUNTER — Ambulatory Visit: Payer: 59

## 2022-11-08 ENCOUNTER — Ambulatory Visit: Payer: 59

## 2022-11-12 ENCOUNTER — Ambulatory Visit: Payer: 59

## 2022-11-12 DIAGNOSIS — R262 Difficulty in walking, not elsewhere classified: Secondary | ICD-10-CM

## 2022-11-12 DIAGNOSIS — M6281 Muscle weakness (generalized): Secondary | ICD-10-CM | POA: Diagnosis not present

## 2022-11-12 DIAGNOSIS — R278 Other lack of coordination: Secondary | ICD-10-CM

## 2022-11-12 NOTE — Therapy (Signed)
OUTPATIENT PHYSICAL THERAPY NEURO TREATMENT NOTE  Patient Name: Tyler Glover MRN: [id number] DOB:10-12-1960, 62 y.o., male Today's Date: 11/12/2022   PCP:    Elias Else   REFERRING PROVIDER:   Lonell Face, MD    END OF SESSION:  PT End of Session - 11/12/22 1611     Visit Number 14    Number of Visits 24    Date for PT Re-Evaluation 11/27/22    PT Start Time 1615    PT Stop Time 1657    PT Time Calculation (min) 42 min    Equipment Utilized During Treatment Gait belt    Activity Tolerance Patient tolerated treatment well    Behavior During Therapy WFL for tasks assessed/performed                   Past Medical History:  Diagnosis Date   Allergy    Arthritis    Rheumatoid   GERD (gastroesophageal reflux disease)    Hypertension    Past Surgical History:  Procedure Laterality Date   COLONOSCOPY     COLONOSCOPY WITH PROPOFOL N/A 08/21/2019   Procedure: COLONOSCOPY WITH PROPOFOL;  Surgeon: Midge Minium, MD;  Location: [facility];  Service: Endoscopy;  Laterality: N/A;  priority 4   ESOPHAGOGASTRODUODENOSCOPY (EGD) WITH PROPOFOL N/A 08/21/2019   Procedure: ESOPHAGOGASTRODUODENOSCOPY (EGD) WITH PROPOFOL;  Surgeon: Midge Minium, MD;  Location: [facility];  Service: Endoscopy;  Laterality: N/A;   POLYPECTOMY  08/21/2019   Procedure: POLYPECTOMY;  Surgeon: Midge Minium, MD;  Location: [facility];  Service: Endoscopy;;   SHOULDER SURGERY     TESTICLE SURGERY     age 71 or 67   TONSILLECTOMY     Patient Active Problem List   Diagnosis Date Noted   Encounter for screening colonoscopy    Polyp of transverse colon    Heartburn    Pure hypercholesterolemia 01/26/2016   Right lateral epicondylitis 07/07/2015   Vitamin D deficiency 12/24/2014   Prediabetes 12/24/2014   Allergic rhinitis, seasonal 06/03/2014   Acid reflux 06/03/2014   Essential (primary) hypertension 06/03/2014   Family history of diabetes mellitus  06/03/2014   Family history of cardiac disorder 06/03/2014   Obesity (BMI 30.0-34.9) 06/03/2014    ONSET DATE: 02/13/2020  REFERRING DIAG: G20.C (ICD-10-CM) - Parkinsonism   THERAPY DIAG:  Other lack of coordination  Difficulty in walking, not elsewhere classified  Muscle weakness (generalized)  Rationale for Evaluation and Treatment: Rehabilitation  SUBJECTIVE:  SUBJECTIVE STATEMENT: Pt reports his hips hurt when he sleeps at night. He thinks this has been going on for a month or so R side more than L. He says this pain is a little sharp. Unsure if pain feels deep or superficial. He thinks it has to do with sitting a lot lately at work.  Pt accompanied by: self  PERTINENT HISTORY:   Pt is a pleasant 62 yo male here for impairments related to Parkinsonism. The pt was diagnosed approx 2 years ago. He returns after absence following completion of LSVT BIG and is currently in speech for Parkinsonism-related impairments. He reports since completing LSVT BIG his movements have improved overall but he has experienced some decline since discharge. Pt reports he is having the most difficulty throughout L side, particularly L arm and ankle. He is also concerned about decreased strength and gait abnormality.  PMH significant for rheumatoid arthritis, HTN, hx of shoulder surgery  PAIN:  Are you having pain? Yes: NPRS scale: 3.5/10 Pain location: LBP Pain description: across low back Aggravating factors: sharp Relieving factors: laying down  PRECAUTIONS: Fall  WEIGHT BEARING RESTRICTIONS: No  FALLS: Has patient fallen in last 6 months? No  LIVING ENVIRONMENT: Lives with: lives with their family and lives with their spouse Lives in: House/apartment Stairs: Yes: Internal: >10 steps; not  reported   PLOF: Independent  PATIENT GOALS:   Strengthening my legs and my arms, improving walking, particularly arm-swing, getting out of the car more quickly after I have been driving for a while  OBJECTIVE:   DIAGNOSTIC FINDINGS:   Via chart 12/29/2021:  "MPRESSION: 1. Normal brain MRI. 2. NeuroQuant volumetric analysis of the brain, see details on [employer].     Electronically Signed   By: Tiburcio Pea M.D.   On: 01/01/2022 05:55"  COGNITION: Overall cognitive status: Within functional limits for tasks assessed   SENSATION: WFL In tact to light touch BLE   COORDINATION: Impaired primarily L side  EDEMA:  None observed or reported     POSTURE: forward head and increased thoracic kyphosis  LOWER EXTREMITY MMT:    LE strength grossly 4+/5 bilaterally, most deficits found LLE    TRANSFERS: Assistive device utilized: None  Sit to stand: Complete Independence however with decreased speed (see 5xSTS)   STAIRS: Level of Assistance: Modified independence Stair Negotiation Technique: Alternating Pattern  with Bilateral Rails Number of Stairs: 5-6  Pt reports more steps at home, can be tiring   GAIT: Gait pattern:  Decreased arm-swing bilat, however, L more severe than R and absent at times. Pt also with decreased heel strike LLE, at times exhibits festination with fatigue Distance walked: see for total distance Assistive device utilized: None Level of assistance: SBA   FUNCTIONAL TESTS:  5 times sit to stand: 13 seconds hands-free 6 minute walk test: 1241 ft and fatiguing. Worsening of gait mechanics with fatigue  10 meter walk test: 1.4 m/s  Berg Balance Scale: 49/56 Dynamic Gait Index: 19/24  Don and doff both shoes (sneakers): 53 seconds   TODAY'S TREATMENT:  DATE:  11/12/22  Gait belt donned and CGA provided  throughout unless specified otherwise   NMR: -Large-amplitude STS with BUE abduction and upright posture 1x15, 1x10. Cuing for bilat finger abduction  Large amplitude technique to donn/doff shoes: pt reports current shoes are new/not broken in Trial 1: 84 seconds Trial 2: 81 seconds   -Standing LTL step with BUE shoulder abduction 2x15 each way. Rates medium, particularly to L side   -Standing lunge position focus on large alt arm-swing 2x15 each side  - Gait with emphasis on large-amplitude arm-swing (focus on LUE) and upright posture x 296 ft. Pt require cuing for arm-swing 30% of time (improved)    TE: Leg press 40# 10x bilat - rates medium  Leg press 25# LLE only 2x10  Standing wall push-up 10x bilat UE  Standing wall push-up LUE bias 10x. Pt rates medium   Treadmill endurance training, ambulates up to 2.1 mph x 4 minutes. Cuing for heel-strike. PT adjusts intensity throughout and monitors pt for response.      Postural strengthening: Matrix cable machine rows: Matrix cable machine serratus punches with 2.5# 1x10 LUE, 7.5# 2x10 LUE.  Emphasis on speed of punch  Standing L gastroc stretch x 60 sec   PATIENT EDUCATION: Education details: Pt educated throughout session about proper posture and technique with exercises. Improved exercise technique, movement at target joints, use of target muscles after min to mod verbal, visual, tactile cues wellzone machines Person educated: Patient Education method: Explanation, Demonstration, Tactile cues, and Verbal cues Education comprehension: verbalized understanding, returned demonstration, verbal cues required, tactile cues required, and needs further education    HOME EXERCISE PROGRAM:  10/04/2022: Access Code: [email or web address] URL: [email or web address] Date: 10/04/2022 Prepared by: Temple Pacini  Exercises - Gastroc Stretch on Wall  - 1 x daily - 7 x weekly - 1 sets - 1 reps - 60 seconds hold  07/23/22: issued as  printout: Standing and swinging your arms (alternating) -focus on swinging left arm behind you.  Perform this 6 days/week 3 sets of 20 reps. Sit to stand with arms out. Perform 5-6 days/week for 3 sets of 10 reps.   GOALS: Goals reviewed with patient? Yes   SHORT TERM GOALS: Target date: 11/13/2022      Patient will be independent in home exercise program and WellZone LE strengthening to improve strength/mobility for better functional independence with ADLs. Baseline: initiated advanced HEP/wellzone strengthening Goal status: REVISED   LONG TERM GOALS: Target date: 01/08/2023     Patient will increase FOTO score to equal to or greater than     to demonstrate statistically significant improvement in mobility and quality of life.  Baseline:  Goal status: INITIAL/DISCONTINUED, previously completed with LSVT  2.  Patient (< 17 years old) will complete five times sit to stand test in < 10 seconds indicating an increased LE strength and improved balance. Baseline: 13 seconds; 09/20/22:9 sec hands-free Goal status: MET  3.  Patient will increase Berg Balance score by > 6 points to demonstrate decreased fall risk during functional activities Baseline: 49/56; 09/20/22: 55/56 - lost a point with standing EC; 10/16/2022 56/56 Goal status: MET   4.  Patient will increase dynamic gait index score to >20/24 as to demonstrate reduced fall risk and improved dynamic gait balance for better safety with community/home ambulation.   Baseline: 19/24; 09/20/22: 21/24 Goal status: MET  5.Patient will increase six minute walk test distance to >1300 for improve gait ability and endurance with community activities.  Baseline: 1241  ft and fatiguing. Worsening of gait mechanics with fatigue; 09/20/22:1588 ft  Goal status: MET  6. Patient will maintain L arm swing for at least 3 minutes without cuing in order to demonstrate improved ability to override hypokinetic movement.  Baseline: absent or significantly  decreased arm-swing observed 100% of time during 3 minute, continuous walking period; 09/20/22: Pt requires cuing <30% of the time, improved from initial assessment; 10/16/2022: pt continues to require cuing around <30% of the time  Goal status: PARTIALLY MET  7.  Pt will improve ability to doff/don both shoes while seated by at least 10 seconds in order to demonstrate improved functional mobility of BUE and increased ease in dressing. Baseline: 53 seconds; 09/21/22: 40 seconds Goal status: MET      ASSESSMENT:  CLINICAL IMPRESSION:   Pt completed a mix of PD NMR interventions and strengthening with focus on LUE/LLE. Pt highly motivated to complete interventions, but still with quick increase in fatigue.  Pt tolerated all interventions well without pain. Plan to continue to monitor hip pain (as reported to PT at start of visit). The pt will benefit from further skilled PT to improve strength, gait, balance, and mobility.    OBJECTIVE IMPAIRMENTS: Abnormal gait, decreased activity tolerance, decreased balance, decreased coordination, decreased endurance, decreased mobility, difficulty walking, decreased strength, impaired UE functional use, improper body mechanics, postural dysfunction, and pain.   ACTIVITY LIMITATIONS: squatting, stairs, bed mobility, dressing, and locomotion level  PARTICIPATION LIMITATIONS: cleaning, community activity, occupation, and yard work  PERSONAL FACTORS: Age, Time since onset of injury/illness/exacerbation, and 3+ comorbidities: PMH significant for rheumatoid arthritis, HTN, hx of shoulder surgery  are also affecting patient's functional outcome.   REHAB POTENTIAL: Good  CLINICAL DECISION MAKING: Evolving/moderate complexity  EVALUATION COMPLEXITY: Moderate  PLAN:  PT FREQUENCY: 2x/week  PT DURATION: 12 weeks  PLANNED INTERVENTIONS: Therapeutic exercises, Therapeutic activity, Neuromuscular re-education, Balance training, Gait training, Patient/Family  education, Self Care, Joint mobilization, Stair training, Vestibular training, Canalith repositioning, Visual/preceptual remediation/compensation, Orthotic/Fit training, DME instructions, Electrical stimulation, Wheelchair mobility training, Spinal mobilization, Cryotherapy, Moist heat, Taping, Ultrasound, Manual therapy, and Re-evaluation  PLAN FOR NEXT SESSION: Parkinson's specific exercises, SLB, rock and reach exercises, WellZONE LE and UE strengthening, focus on LLE/LUE   Baird Kay, PT, DPT 11/12/2022, 5:02 PM

## 2022-11-13 ENCOUNTER — Ambulatory Visit: Payer: 59

## 2022-11-15 ENCOUNTER — Ambulatory Visit: Payer: 59

## 2022-11-19 ENCOUNTER — Ambulatory Visit: Payer: 59 | Attending: Neurology

## 2022-11-19 DIAGNOSIS — R262 Difficulty in walking, not elsewhere classified: Secondary | ICD-10-CM | POA: Diagnosis present

## 2022-11-19 DIAGNOSIS — R278 Other lack of coordination: Secondary | ICD-10-CM | POA: Insufficient documentation

## 2022-11-19 DIAGNOSIS — R2681 Unsteadiness on feet: Secondary | ICD-10-CM | POA: Insufficient documentation

## 2022-11-19 NOTE — Therapy (Signed)
OUTPATIENT PHYSICAL THERAPY NEURO TREATMENT NOTE  Patient Name: Tyler Glover MRN: [id number] DOB:1960/10/31, 62 y.o., male Today's Date: 11/19/2022   PCP:    Elias Else   REFERRING PROVIDER:   Lonell Face, MD    END OF SESSION:  PT End of Session - 11/19/22 1617     Visit Number 15    Number of Visits 24    Date for PT Re-Evaluation 11/27/22    PT Start Time 1618    PT Stop Time 1701    PT Time Calculation (min) 43 min    Equipment Utilized During Treatment Gait belt    Activity Tolerance Patient tolerated treatment well    Behavior During Therapy WFL for tasks assessed/performed                   Past Medical History:  Diagnosis Date   Allergy    Arthritis    Rheumatoid   GERD (gastroesophageal reflux disease)    Hypertension    Past Surgical History:  Procedure Laterality Date   COLONOSCOPY     COLONOSCOPY WITH PROPOFOL N/A 08/21/2019   Procedure: COLONOSCOPY WITH PROPOFOL;  Surgeon: Midge Minium, MD;  Location: [facility];  Service: Endoscopy;  Laterality: N/A;  priority 4   ESOPHAGOGASTRODUODENOSCOPY (EGD) WITH PROPOFOL N/A 08/21/2019   Procedure: ESOPHAGOGASTRODUODENOSCOPY (EGD) WITH PROPOFOL;  Surgeon: Midge Minium, MD;  Location: [facility];  Service: Endoscopy;  Laterality: N/A;   POLYPECTOMY  08/21/2019   Procedure: POLYPECTOMY;  Surgeon: Midge Minium, MD;  Location: [facility];  Service: Endoscopy;;   SHOULDER SURGERY     TESTICLE SURGERY     age 64 or 18   TONSILLECTOMY     Patient Active Problem List   Diagnosis Date Noted   Encounter for screening colonoscopy    Polyp of transverse colon    Heartburn    Pure hypercholesterolemia 01/26/2016   Right lateral epicondylitis 07/07/2015   Vitamin D deficiency 12/24/2014   Prediabetes 12/24/2014   Allergic rhinitis, seasonal 06/03/2014   Acid reflux 06/03/2014   Essential (primary) hypertension 06/03/2014   Family history of diabetes mellitus  06/03/2014   Family history of cardiac disorder 06/03/2014   Obesity (BMI 30.0-34.9) 06/03/2014    ONSET DATE: 02/13/2020  REFERRING DIAG: G20.C (ICD-10-CM) - Parkinsonism   THERAPY DIAG:  Other lack of coordination  Difficulty in walking, not elsewhere classified  Unsteadiness on feet  Rationale for Evaluation and Treatment: Rehabilitation  SUBJECTIVE:  SUBJECTIVE STATEMENT: Pt reports continued R hip pain that improves with movement. He reports it wakes him up but he is able to go back to sleep changing position. He notices pain most laying on L side and some on the R side. This has been going on for a couple weeks. He thinks having to sit for long periods of time at work worsened things. Has not gotten to HEP much due to business of schedule, difficulty finding time for exercises.  Pt accompanied by: self  PERTINENT HISTORY:   Pt is a pleasant 62 yo male here for impairments related to Parkinsonism. The pt was diagnosed approx 2 years ago. He returns after absence following completion of LSVT BIG and is currently in speech for Parkinsonism-related impairments. He reports since completing LSVT BIG his movements have improved overall but he has experienced some decline since discharge. Pt reports he is having the most difficulty throughout L side, particularly L arm and ankle. He is also concerned about decreased strength and gait abnormality.  PMH significant for rheumatoid arthritis, HTN, hx of shoulder surgery  PAIN:  Are you having pain? Yes: NPRS scale: 3.5/10 Pain location: LBP Pain description: across low back Aggravating factors: sharp Relieving factors: laying down  PRECAUTIONS: Fall  WEIGHT BEARING RESTRICTIONS: No  FALLS: Has patient fallen in last 6 months? No  LIVING  ENVIRONMENT: Lives with: lives with their family and lives with their spouse Lives in: House/apartment Stairs: Yes: Internal: >10 steps; not reported   PLOF: Independent  PATIENT GOALS:   Strengthening my legs and my arms, improving walking, particularly arm-swing, getting out of the car more quickly after I have been driving for a while  OBJECTIVE:   DIAGNOSTIC FINDINGS:   Via chart 12/29/2021:  "MPRESSION: 1. Normal brain MRI. 2. NeuroQuant volumetric analysis of the brain, see details on [employer].     Electronically Signed   By: Tiburcio Pea M.D.   On: 01/01/2022 05:55"  COGNITION: Overall cognitive status: Within functional limits for tasks assessed   SENSATION: WFL In tact to light touch BLE   COORDINATION: Impaired primarily L side  EDEMA:  None observed or reported     POSTURE: forward head and increased thoracic kyphosis  LOWER EXTREMITY MMT:    LE strength grossly 4+/5 bilaterally, most deficits found LLE    TRANSFERS: Assistive device utilized: None  Sit to stand: Complete Independence however with decreased speed (see 5xSTS)   STAIRS: Level of Assistance: Modified independence Stair Negotiation Technique: Alternating Pattern  with Bilateral Rails Number of Stairs: 5-6  Pt reports more steps at home, can be tiring   GAIT: Gait pattern:  Decreased arm-swing bilat, however, L more severe than R and absent at times. Pt also with decreased heel strike LLE, at times exhibits festination with fatigue Distance walked: see for total distance Assistive device utilized: None Level of assistance: SBA   FUNCTIONAL TESTS:  5 times sit to stand: 13 seconds hands-free 6 minute walk test: 1241 ft and fatiguing. Worsening of gait mechanics with fatigue  10 meter walk test: 1.4 m/s  Berg Balance Scale: 49/56 Dynamic Gait Index: 19/24  Don and doff both shoes (sneakers): 53 seconds   TODAY'S TREATMENT:  DATE:  11/19/22  Gait belt donned and CGA provided throughout unless specified otherwise   TA: Goal reassessment completed this visit, please refer to goal section and assessment below for details.  NMR:  -Large-amplitude STS with BUE abduction and upright posture 3x10. Cuing for bilat finger abduction - added to HEP  Standing alternating UE arm-swing, mirror cue, emphasis on LUE 3x60 sec - added to HEP  - Gait with emphasis on large-amplitude arm-swing (focus on LUE), 1.5# wrist weights,  and upright posture x 444 ft. Cuing throughout   Standing alternating UE arm-swing, mirror cue, emphasis on LUE 2x60 sec   TE: Leg press level 3, 4, 7 10x for each. No pain with intervention.    PATIENT EDUCATION: Education details: Pt educated throughout session about proper posture and technique with exercises. Improved exercise technique, movement at target joints, use of target muscles after min to mod verbal, visual, tactile cues wellzone machines. Goals, HEP, plan Person educated: Patient Education method: Explanation, Demonstration, Tactile cues, Verbal cues, and Handouts Education comprehension: verbalized understanding, returned demonstration, verbal cues required, tactile cues required, and needs further education    HOME EXERCISE PROGRAM:  11/19/22:  Website error* only prints one exercise, will need to try to reissue next visit  Access Code: [email or web address] URL: [email or web address] Prepared by: Temple Pacini  Exercises - Arm Swings  - 1 x daily - 7 x weekly - 3 sets - 1 reps - 60 seconds hold *only exercise printed. Written instructions to alternate arm swing - Sit to Stand  - 1 x daily - 5-6 x weekly - 3 sets - 10 reps - Walking  - 1 x daily - 7 x weekly - 3 sets - 1 reps - 60 seconds hold  10/04/2022: Access Code: [email or web address] URL: [email or web address] Date:  10/04/2022 Prepared by: Temple Pacini  Exercises - Gastroc Stretch on Wall  - 1 x daily - 7 x weekly - 1 sets - 1 reps - 60 seconds hold  07/23/22: issued as printout: Standing and swinging your arms (alternating) -focus on swinging left arm behind you.  Perform this 6 days/week 3 sets of 20 reps. Sit to stand with arms out. Perform 5-6 days/week for 3 sets of 10 reps.   GOALS: Goals reviewed with patient? Yes   SHORT TERM GOALS: Target date: 11/13/2022      Patient will be independent in home exercise program and WellZone LE strengthening to improve strength/mobility for better functional independence with ADLs. Baseline: initiated advanced HEP/wellzone strengthening; 10/7: pt reports he has not been consistent with HEP due to business with job and household responsibilities  Goal status: REVISED   LONG TERM GOALS: Target date: 01/08/2023     Patient will increase FOTO score to equal to or greater than     to demonstrate statistically significant improvement in mobility and quality of life.  Baseline:  Goal status: INITIAL/DISCONTINUED, previously completed with LSVT  2.  Patient (< 49 years old) will complete five times sit to stand test in < 10 seconds indicating an increased LE strength and improved balance. Baseline: 13 seconds; 09/20/22:9 sec hands-free Goal status: MET  3.  Patient will increase Berg Balance score by > 6 points to demonstrate decreased fall risk during functional activities Baseline: 49/56; 09/20/22: 55/56 - lost a point with standing EC; 10/16/2022 56/56 Goal status: MET   4.  Patient will increase dynamic gait index score to >20/24 as to demonstrate reduced fall risk and improved dynamic gait balance for better safety with  community/home ambulation.   Baseline: 19/24; 09/20/22: 21/24 Goal status: MET  5.Patient will increase six minute walk test distance to >1300 for improve gait ability and endurance with community activities.  Baseline: 1241 ft and  fatiguing. Worsening of gait mechanics with fatigue; 09/20/22:1588 ft  Goal status: MET  6. Patient will maintain L arm swing for at least 3 minutes without cuing in order to demonstrate improved ability to override hypokinetic movement.  Baseline: absent or significantly decreased arm-swing observed 100% of time during 3 minute, continuous walking period; 09/20/22: Pt requires cuing <30% of the time, improved from initial assessment; 10/16/2022: pt continues to require cuing around <30% of the time; 11/19/22:  requires cuing >30% of time, Goal status: NOT MET   7.  Pt will improve ability to doff/don both shoes while seated by at least 10 seconds in order to demonstrate improved functional mobility of BUE and increased ease in dressing. Baseline: 53 seconds; 09/21/22: 40 seconds Goal status: MET      ASSESSMENT:  CLINICAL IMPRESSION:   Goal reassessment completed this visit since next appointment is last PT visit. Pt with decreased performance on arm-swing goal, has not met HEP goal. Pt reports increased work responsibilities that has impacted time he is able to dedicate to interventions. PT reviewed with pt strategies to increase consistency with HEP. Remainder of session dedicated to updating home exercises to improve remaining impairments. Will need to re-issue next visit due to website error. The pt will benefit from further skilled PT to improve strength, gait, balance, and mobility.    OBJECTIVE IMPAIRMENTS: Abnormal gait, decreased activity tolerance, decreased balance, decreased coordination, decreased endurance, decreased mobility, difficulty walking, decreased strength, impaired UE functional use, improper body mechanics, postural dysfunction, and pain.   ACTIVITY LIMITATIONS: squatting, stairs, bed mobility, dressing, and locomotion level  PARTICIPATION LIMITATIONS: cleaning, community activity, occupation, and yard work  PERSONAL FACTORS: Age, Time since onset of  injury/illness/exacerbation, and 3+ comorbidities: PMH significant for rheumatoid arthritis, HTN, hx of shoulder surgery  are also affecting patient's functional outcome.   REHAB POTENTIAL: Good  CLINICAL DECISION MAKING: Evolving/moderate complexity  EVALUATION COMPLEXITY: Moderate  PLAN:  PT FREQUENCY: 2x/week  PT DURATION: 12 weeks  PLANNED INTERVENTIONS: Therapeutic exercises, Therapeutic activity, Neuromuscular re-education, Balance training, Gait training, Patient/Family education, Self Care, Joint mobilization, Stair training, Vestibular training, Canalith repositioning, Visual/preceptual remediation/compensation, Orthotic/Fit training, DME instructions, Electrical stimulation, Wheelchair mobility training, Spinal mobilization, Cryotherapy, Moist heat, Taping, Ultrasound, Manual therapy, and Re-evaluation  PLAN FOR NEXT SESSION: Parkinson's specific exercises, SLB, rock and reach exercises, WellZONE LE and UE strengthening, focus on LLE/LUE, discharge, review HEP   Baird Kay, PT, DPT 11/19/2022, 5:08 PM

## 2022-11-20 ENCOUNTER — Ambulatory Visit: Payer: 59

## 2022-11-22 ENCOUNTER — Ambulatory Visit: Payer: 59

## 2022-11-26 ENCOUNTER — Ambulatory Visit: Payer: 59

## 2022-11-27 ENCOUNTER — Ambulatory Visit: Payer: 59

## 2022-11-29 ENCOUNTER — Ambulatory Visit: Payer: 59

## 2022-11-29 DIAGNOSIS — R278 Other lack of coordination: Secondary | ICD-10-CM

## 2022-11-29 NOTE — Therapy (Signed)
OUTPATIENT PHYSICAL THERAPY NEURO TREATMENT NOTE/RECERT ONE VISIT/DISCHARGE  Patient Name: Tyler Glover MRN: [id number] DOB:[birth year], 62 y.o., male Today's Date: 11/29/2022   PCP:    Tyler Glover   REFERRING PROVIDER:   Lonell Face, MD    END OF SESSION:  PT End of Session - 11/29/22 1700     Visit Number 16    Number of Visits 16    Date for PT Re-Evaluation 11/29/22    PT Start Time 1621    PT Stop Time 1653    PT Time Calculation (min) 32 min    Equipment Utilized During Treatment Gait belt    Activity Tolerance Patient tolerated treatment well;No increased pain    Behavior During Therapy WFL for tasks assessed/performed                    Past Medical History:  Diagnosis Date   Allergy    Arthritis    Rheumatoid   GERD (gastroesophageal reflux disease)    Hypertension    Past Surgical History:  Procedure Laterality Date   COLONOSCOPY     COLONOSCOPY WITH PROPOFOL N/A 08/21/2019   Procedure: COLONOSCOPY WITH PROPOFOL;  Surgeon: Midge Minium, MD;  Location: [facility];  Service: Endoscopy;  Laterality: N/A;  priority 4   ESOPHAGOGASTRODUODENOSCOPY (EGD) WITH PROPOFOL N/A 08/21/2019   Procedure: ESOPHAGOGASTRODUODENOSCOPY (EGD) WITH PROPOFOL;  Surgeon: Midge Minium, MD;  Location: [facility];  Service: Endoscopy;  Laterality: N/A;   POLYPECTOMY  08/21/2019   Procedure: POLYPECTOMY;  Surgeon: Midge Minium, MD;  Location: [facility];  Service: Endoscopy;;   SHOULDER SURGERY     TESTICLE SURGERY     age 72 or 75   TONSILLECTOMY     Patient Active Problem List   Diagnosis Date Noted   Encounter for screening colonoscopy    Polyp of transverse colon    Heartburn    Pure hypercholesterolemia 01/26/2016   Right lateral epicondylitis 07/07/2015   Vitamin D deficiency 12/24/2014   Prediabetes 12/24/2014   Allergic rhinitis, seasonal 06/03/2014   Acid reflux 06/03/2014   Essential (primary) hypertension  06/03/2014   Family history of diabetes mellitus 06/03/2014   Family history of cardiac disorder 06/03/2014   Obesity (BMI 30.0-34.9) 06/03/2014    ONSET DATE: 02/13/2020  REFERRING DIAG: G20.C (ICD-10-CM) - Parkinsonism   THERAPY DIAG:  Other lack of coordination  Rationale for Evaluation and Treatment: Rehabilitation  SUBJECTIVE:  SUBJECTIVE STATEMENT: Pt reports continued R hip pain. He reports it wakes him up at night, occurs off and on. He reports it hasn't been quite as aggressive in the last week but is still there. PT recommends pt follow-up with is physician if does not continue to improve in about another week. Pt verbalizes understanding, agreeable to plan.  Pt accompanied by: self  PERTINENT HISTORY:   Pt is a pleasant 62 yo male here for impairments related to Parkinsonism. The pt was diagnosed approx 2 years ago. He returns after absence following completion of LSVT BIG and is currently in speech for Parkinsonism-related impairments. He reports since completing LSVT BIG his movements have improved overall but he has experienced some decline since discharge. Pt reports he is having the most difficulty throughout L side, particularly L arm and ankle. He is also concerned about decreased strength and gait abnormality.  PMH significant for rheumatoid arthritis, HTN, hx of shoulder surgery  PAIN:  Are you having pain? Yes: NPRS scale: 3.5/10 Pain location: LBP Pain description: across low back Aggravating factors: sharp Relieving factors: laying down  PRECAUTIONS: Fall  WEIGHT BEARING RESTRICTIONS: No  FALLS: Has patient fallen in last 6 months? No  LIVING ENVIRONMENT: Lives with: lives with their family and lives with their spouse Lives in: House/apartment Stairs: Yes: Internal: >10  steps; not reported   PLOF: Independent  PATIENT GOALS:   Strengthening my legs and my arms, improving walking, particularly arm-swing, getting out of the car more quickly after I have been driving for a while  OBJECTIVE:   DIAGNOSTIC FINDINGS:   Via chart 12/29/2021:  "MPRESSION: 1. Normal brain MRI. 2. NeuroQuant volumetric analysis of the brain, see details on [employer].     Electronically Signed   By: Tiburcio Pea M.D.   On: 01/01/2022 05:55"  COGNITION: Overall cognitive status: Within functional limits for tasks assessed   SENSATION: WFL In tact to light touch BLE   COORDINATION: Impaired primarily L side  EDEMA:  None observed or reported     POSTURE: forward head and increased thoracic kyphosis  LOWER EXTREMITY MMT:    LE strength grossly 4+/5 bilaterally, most deficits found LLE    TRANSFERS: Assistive device utilized: None  Sit to stand: Complete Independence however with decreased speed (see 5xSTS)   STAIRS: Level of Assistance: Modified independence Stair Negotiation Technique: Alternating Pattern  with Bilateral Rails Number of Stairs: 5-6  Pt reports more steps at home, can be tiring   GAIT: Gait pattern:  Decreased arm-swing bilat, however, L more severe than R and absent at times. Pt also with decreased heel strike LLE, at times exhibits festination with fatigue Distance walked: see for total distance Assistive device utilized: None Level of assistance: SBA   FUNCTIONAL TESTS:  5 times sit to stand: 13 seconds hands-free 6 minute walk test: 1241 ft and fatiguing. Worsening of gait mechanics with fatigue  10 meter walk test: 1.4 m/s  Berg Balance Scale: 49/56 Dynamic Gait Index: 19/24  Don and doff both shoes (sneakers): 53 seconds   TODAY'S TREATMENT:  DATE:  11/29/22  Gait belt donned and  CGA provided throughout unless specified otherwise   TA: Review of HEP including technique with demo/vc, recommended frequency, sets and reps:  Access Code: [email or web address] URL: [email or web address] Date: 11/29/2022 Prepared by: Temple Pacini  Exercises - Arm Swings  - 1 x daily - 4-7 x weekly 3 sets 60 seconds *written - Sit to Stand Without Arm Support  - 1 x daily - 5-6 x weekly - 3 sets - 10 reps - Walking  - 1 x daily - 7 x weekly - 3 sets - 1 reps - 60 seconds hold  Additionally reviewed and re-printed for pt standing gastroc stretch  Provided post d/c instructions, and to follow-up in around 6 months if worried about regression. Reviewed with pt previous tests and measures, progress, how to continue to advance HEP beyond PT and recommended joining fitness center. Pt verbalized understanding for all.    PATIENT EDUCATION: Education details: HEP, discharge instructions/recommendations Person educated: Patient Education method: Explanation, Demonstration, Tactile cues, Verbal cues, and Handouts Education comprehension: verbalized understanding, returned demonstration, verbal cues required, tactile cues required, and needs further education    HOME EXERCISE PROGRAM:  11/19/22:  Website error* only prints one exercise, will need to try to reissue next visit  Access Code: [email or web address] URL: [email or web address] Prepared by: Temple Pacini  Exercises - Arm Swings  - 1 x daily - 7 x weekly - 3 sets - 1 reps - 60 seconds hold *only exercise printed. Written instructions to alternate arm swing - Sit to Stand  - 1 x daily - 5-6 x weekly - 3 sets - 10 reps - Walking  - 1 x daily - 7 x weekly - 3 sets - 1 reps - 60 seconds hold  10/04/2022: Access Code: [email or web address] URL: [email or web address] Date: 10/04/2022 Prepared by: Temple Pacini  Exercises - Gastroc Stretch on Wall  - 1 x daily - 7 x weekly - 1 sets - 1 reps - 60 seconds hold  07/23/22: issued as  printout: Standing and swinging your arms (alternating) -focus on swinging left arm behind you.  Perform this 6 days/week 3 sets of 20 reps. Sit to stand with arms out. Perform 5-6 days/week for 3 sets of 10 reps.   GOALS: Goals reviewed with patient? Yes   SHORT TERM GOALS: Target date: 11/13/2022      Patient will be independent in home exercise program and [employer] LE strengthening to improve strength/mobility for better functional independence with ADLs. Baseline: initiated advanced HEP/[employer] strengthening; 10/7: pt reports he has not been consistent with HEP due to business with job and household responsibilities  Goal status: REVISED   LONG TERM GOALS: Target date: 01/08/2023     Patient will increase FOTO score to equal to or greater than     to demonstrate statistically significant improvement in mobility and quality of life.  Baseline:  Goal status: INITIAL/DISCONTINUED, previously completed with LSVT  2.  Patient (< 58 years old) will complete five times sit to stand test in < 10 seconds indicating an increased LE strength and improved balance. Baseline: 13 seconds; 09/20/22:9 sec hands-free Goal status: MET  3.  Patient will increase Berg Balance score by > 6 points to demonstrate decreased fall risk during functional activities Baseline: 49/56; 09/20/22: 55/56 - lost a point with standing EC; 10/16/2022 56/56 Goal status: MET   4.  Patient will increase dynamic gait index score to >20/24 as to demonstrate reduced fall risk and improved dynamic gait balance for  better safety with community/home ambulation.   Baseline: 19/24; 09/20/22: 21/24 Goal status: MET  5.Patient will increase six minute walk test distance to >1300 for improve gait ability and endurance with community activities.  Baseline: 1241 ft and fatiguing. Worsening of gait mechanics with fatigue; 09/20/22:1588 ft  Goal status: MET  6. Patient will maintain L arm swing for at least 3 minutes without cuing  in order to demonstrate improved ability to override hypokinetic movement.  Baseline: absent or significantly decreased arm-swing observed 100% of time during 3 minute, continuous walking period; 09/20/22: Pt requires cuing <30% of the time, improved from initial assessment; 10/16/2022: pt continues to require cuing around <30% of the time; 11/19/22:  requires cuing >30% of time, Goal status: NOT MET   7.  Pt will improve ability to doff/don both shoes while seated by at least 10 seconds in order to demonstrate improved functional mobility of BUE and increased ease in dressing. Baseline: 53 seconds; 09/21/22: 40 seconds Goal status: MET      ASSESSMENT:  CLINICAL IMPRESSION:   Reviewed HEPs and discharge recommendations with pt for final visit. Instructed pt to follow-up in around 6 months should he experience regression in mobility. Pt verbalized understanding to all and is agreeable to discharge at this time.   The pt does not require further skilled PT at this time.  OBJECTIVE IMPAIRMENTS: Abnormal gait, decreased activity tolerance, decreased balance, decreased coordination, decreased endurance, decreased mobility, difficulty walking, decreased strength, impaired UE functional use, improper body mechanics, postural dysfunction, and pain.   ACTIVITY LIMITATIONS: squatting, stairs, bed mobility, dressing, and locomotion level  PARTICIPATION LIMITATIONS: cleaning, community activity, occupation, and yard work  PERSONAL FACTORS: Age, Time since onset of injury/illness/exacerbation, and 3+ comorbidities: PMH significant for rheumatoid arthritis, HTN, hx of shoulder surgery  are also affecting patient's functional outcome.   REHAB POTENTIAL: Good  CLINICAL DECISION MAKING: Evolving/moderate complexity  EVALUATION COMPLEXITY: Moderate  PLAN:  PT FREQUENCY: 2x/week  PT DURATION: 12 weeks  PLANNED INTERVENTIONS: Therapeutic exercises, Therapeutic activity, Neuromuscular re-education, Balance  training, Gait training, Patient/Family education, Self Care, Joint mobilization, Stair training, Vestibular training, Canalith repositioning, Visual/preceptual remediation/compensation, Orthotic/Fit training, DME instructions, Electrical stimulation, Wheelchair mobility training, Spinal mobilization, Cryotherapy, Moist heat, Taping, Ultrasound, Manual therapy, and Re-evaluation  PLAN FOR NEXT SESSION: d/c recommendations and instructions   Baird Kay, PT, DPT 11/29/2022, 5:02 PM

## 2022-12-03 ENCOUNTER — Ambulatory Visit: Payer: 59

## 2022-12-04 ENCOUNTER — Ambulatory Visit: Payer: 59

## 2022-12-06 ENCOUNTER — Ambulatory Visit: Payer: 59

## 2022-12-10 ENCOUNTER — Ambulatory Visit: Payer: 59

## 2022-12-13 ENCOUNTER — Ambulatory Visit: Payer: 59

## 2022-12-17 ENCOUNTER — Ambulatory Visit: Payer: 59

## 2022-12-20 ENCOUNTER — Ambulatory Visit: Payer: 59

## 2022-12-24 ENCOUNTER — Ambulatory Visit: Payer: 59

## 2023-01-05 ENCOUNTER — Other Ambulatory Visit: Payer: Self-pay | Admitting: Family Medicine

## 2023-01-05 DIAGNOSIS — H6993 Unspecified Eustachian tube disorder, bilateral: Secondary | ICD-10-CM

## 2023-01-05 DIAGNOSIS — K219 Gastro-esophageal reflux disease without esophagitis: Secondary | ICD-10-CM

## 2023-01-07 ENCOUNTER — Ambulatory Visit: Payer: 59 | Admitting: Family Medicine

## 2023-01-09 ENCOUNTER — Other Ambulatory Visit: Payer: Self-pay

## 2023-01-09 DIAGNOSIS — I10 Essential (primary) hypertension: Secondary | ICD-10-CM

## 2023-01-09 MED ORDER — LISINOPRIL 40 MG PO TABS: ORAL_TABLET | ORAL | 0 refills | Status: DC

## 2023-01-17 ENCOUNTER — Other Ambulatory Visit: Payer: Self-pay

## 2023-01-31 ENCOUNTER — Ambulatory Visit: Payer: 59 | Admitting: Family Medicine

## 2023-02-04 ENCOUNTER — Ambulatory Visit (INDEPENDENT_AMBULATORY_CARE_PROVIDER_SITE_OTHER): Payer: 59 | Admitting: Family Medicine

## 2023-02-04 VITALS — BP 120/78 | HR 77 | Ht 70.0 in | Wt 225.6 lb

## 2023-02-04 DIAGNOSIS — I10 Essential (primary) hypertension: Secondary | ICD-10-CM | POA: Diagnosis not present

## 2023-02-04 DIAGNOSIS — K219 Gastro-esophageal reflux disease without esophagitis: Secondary | ICD-10-CM | POA: Diagnosis not present

## 2023-02-04 DIAGNOSIS — Z23 Encounter for immunization: Secondary | ICD-10-CM

## 2023-02-04 DIAGNOSIS — E7800 Pure hypercholesterolemia, unspecified: Secondary | ICD-10-CM | POA: Diagnosis not present

## 2023-02-04 DIAGNOSIS — N529 Male erectile dysfunction, unspecified: Secondary | ICD-10-CM | POA: Diagnosis not present

## 2023-02-04 DIAGNOSIS — R7989 Other specified abnormal findings of blood chemistry: Secondary | ICD-10-CM

## 2023-02-04 MED ORDER — LISINOPRIL 40 MG PO TABS: ORAL_TABLET | ORAL | 0 refills | Status: DC

## 2023-02-04 MED ORDER — TESTOSTERONE 20.25 MG/ACT (1.62%) TD GEL: 1.0000 | Freq: Every day | TRANSDERMAL | 5 refills | Status: DC

## 2023-02-04 MED ORDER — TADALAFIL 20 MG PO TABS: ORAL_TABLET | ORAL | 5 refills | Status: DC

## 2023-02-04 MED ORDER — PANTOPRAZOLE SODIUM 40 MG PO TBEC: DELAYED_RELEASE_TABLET | ORAL | 5 refills | Status: DC

## 2023-02-04 MED ORDER — ATORVASTATIN CALCIUM 10 MG PO TABS: ORAL_TABLET | ORAL | 5 refills | Status: DC

## 2023-02-04 NOTE — Progress Notes (Signed)
Date:  02/04/2023   Name:  Tyler Glover   DOB:  November 19, 1960   MRN:  [id number]   Chief Complaint: Hyperlipidemia and Hypertension  Hyperlipidemia This is a chronic problem. The current episode started more than 1 year ago. The problem is controlled. Recent lipid tests were reviewed and are normal. He has no history of chronic renal disease, diabetes, hypothyroidism, liver disease, obesity or nephrotic syndrome. There are no known factors aggravating his hyperlipidemia. Pertinent negatives include no chest pain, focal sensory loss, focal weakness, leg pain, myalgias or shortness of breath. Current antihyperlipidemic treatment includes statins. The current treatment provides moderate improvement of lipids. There are no compliance problems.  Risk factors for coronary artery disease include hypertension, dyslipidemia and male sex.  Hypertension This is a chronic problem. The current episode started more than 1 year ago. The problem has been gradually improving since onset. The problem is controlled. Pertinent negatives include no chest pain, palpitations or shortness of breath. Risk factors for coronary artery disease include dyslipidemia. Past treatments include ACE inhibitors. The current treatment provides moderate improvement. There are no compliance problems.  There is no history of chronic renal disease, a hypertension causing med or renovascular disease.  Erectile Dysfunction This is a chronic problem. The current episode started more than 1 year ago. The problem has been gradually improving since onset. The nature of his difficulty is achieving erection and maintaining erection. Irritative symptoms include frequency. Obstructive symptoms do not include dribbling, incomplete emptying, an intermittent stream, a slower stream, straining or a weak stream. Pertinent negatives include no chills or hematuria. The treatment provided moderate relief.    Lab Results  Component Value Date   NA 142  07/04/2022   K 4.5 07/04/2022   CO2 22 07/04/2022   GLUCOSE 83 07/04/2022   BUN 17 07/04/2022   CREATININE 1.03 07/04/2022   CALCIUM 9.4 07/04/2022   EGFR 84 07/04/2022   GFRNONAA 73 06/30/2019   Lab Results  Component Value Date   CHOL 151 07/04/2022   HDL 55 07/04/2022   LDLCALC 82 07/04/2022   TRIG 71 07/04/2022   CHOLHDL 4.6 08/22/2017   No results found for: "TSH" Lab Results  Component Value Date   HGBA1C 5.7 (H) 07/04/2022   No results found for: "WBC", "HGB", "HCT", "MCV", "PLT" Lab Results  Component Value Date   ALT 13 07/04/2022   AST 21 07/04/2022   ALKPHOS 89 07/04/2022   BILITOT 0.6 07/04/2022   Lab Results  Component Value Date   VD25OH 30.1 07/07/2015     Review of Systems  Constitutional:  Negative for chills, fatigue and unexpected weight change.  HENT:  Negative for trouble swallowing.   Eyes:  Negative for visual disturbance.  Respiratory:  Negative for cough, shortness of breath and wheezing.   Cardiovascular:  Negative for chest pain and palpitations.  Gastrointestinal:  Negative for abdominal pain and blood in stool.  Endocrine: Negative for polydipsia and polyuria.  Genitourinary:  Positive for frequency. Negative for hematuria and incomplete emptying.  Musculoskeletal:  Negative for myalgias.  Neurological:  Negative for focal weakness.    Patient Active Problem List   Diagnosis Date Noted   Encounter for screening colonoscopy    Polyp of transverse colon    Heartburn    Pure hypercholesterolemia 01/26/2016   Right lateral epicondylitis 07/07/2015   Vitamin D deficiency 12/24/2014   Prediabetes 12/24/2014   Allergic rhinitis, seasonal 06/03/2014   Acid reflux 06/03/2014   Essential (primary)  hypertension 06/03/2014   Family history of diabetes mellitus 06/03/2014   Family history of cardiac disorder 06/03/2014   Obesity (BMI 30.0-34.9) 06/03/2014    Allergies  Allergen Reactions   Penicillins     Told as child not to do  injectable, but has taken Amoxicillin since Unknown    Past Surgical History:  Procedure Laterality Date   COLONOSCOPY     COLONOSCOPY WITH PROPOFOL N/A 08/21/2019   Procedure: COLONOSCOPY WITH PROPOFOL;  Surgeon: Midge Minium, MD;  Location: [facility];  Service: Endoscopy;  Laterality: N/A;  priority 4   ESOPHAGOGASTRODUODENOSCOPY (EGD) WITH PROPOFOL N/A 08/21/2019   Procedure: ESOPHAGOGASTRODUODENOSCOPY (EGD) WITH PROPOFOL;  Surgeon: Midge Minium, MD;  Location: [facility];  Service: Endoscopy;  Laterality: N/A;   POLYPECTOMY  08/21/2019   Procedure: POLYPECTOMY;  Surgeon: Midge Minium, MD;  Location: [facility];  Service: Endoscopy;;   SHOULDER SURGERY     TESTICLE SURGERY     age 62 or 62   TONSILLECTOMY      Social History   Tobacco Use   Smoking status: Former    Current packs/day: 0.00    Types: Cigarettes    Quit date: 1993    Years since quitting: 31.9   Smokeless tobacco: Never  Vaping Use   Vaping status: Never Used  Substance Use Topics   Alcohol use: Not Currently    Alcohol/week: 0.0 standard drinks of alcohol   Drug use: No     Medication list has been reviewed and updated.  Current Meds  Medication Sig   Amantadine HCl 100 MG tablet Take 50 mg by mouth 2 (two) times daily. Sherryll Burger   atorvastatin (LIPITOR) 10 MG tablet TAKE 1 TABLET(10 MG) BY MOUTH DAILY   calcium carbonate (OS-CAL) 600 MG tablet Take 600 mg by mouth daily.   Cholecalciferol (VITAMIN D) 2000 units CAPS Take 1 capsule (2,000 Units total) by mouth daily.   Cyanocobalamin 1000 MCG CAPS Take 1 capsule by mouth daily.   fluticasone (FLONASE) 50 MCG/ACT nasal spray SHAKE LIQUID AND USE 1 SPRAY IN EACH NOSTRIL DAILY   folic acid (FOLVITE) 1 MG tablet Take 1 tablet by mouth daily. patel   hydroxychloroquine (PLAQUENIL) 200 MG tablet Take 1 tablet by mouth 2 (two) times daily. Dr patel   ibuprofen (ADVIL) 200 MG tablet Take 1 tablet by mouth as needed.   lisinopril (ZESTRIL)  40 MG tablet TAKE 1 TABLET(40 MG) BY MOUTH DAILY   meloxicam (MOBIC) 7.5 MG tablet TAKE 1 TABLET(7.5 MG) BY MOUTH DAILY   methotrexate (RHEUMATREX) 2.5 MG tablet Take 7 tablets by mouth once a week. patel   montelukast (SINGULAIR) 10 MG tablet TAKE 1 TABLET(10 MG) BY MOUTH AT BEDTIME   Multiple Vitamin (MULTIVITAMIN) capsule Take 1 capsule by mouth daily.   OVER THE COUNTER MEDICATION daily. Total Brain Dietary Supplement   pantoprazole (PROTONIX) 40 MG tablet TAKE 1 TABLET(40 MG) BY MOUTH DAILY   pseudoephedrine (WAL-PHED 12 HOUR) 120 MG 12 hr tablet TAKE 1 TABLET BY MOUTH EVERY 12 HOURS AS NEEDED FOR CONGESTION   tadalafil (CIALIS) 20 MG tablet TAKE  1/2-1 TABLET BY MOUTH EVERY OTHER DAY AS NEEDED FOR ERECTILE DYSFUNCTION   Testosterone (ANDROGEL PUMP) 20.25 MG/ACT (1.62%) GEL Place 1 Pump onto the skin daily. Apply 1 pump to the forearm daily   Turmeric (RA TURMERIC EXTRA STRENGTH) 1053 MG TABS Take by mouth.   vitamin E 180 MG (400 UNITS) capsule Take 400 Units by mouth daily.  02/04/2023    1:24 PM 07/04/2022   10:04 AM 01/03/2022   11:25 AM 08/28/2021   11:42 AM  GAD 7 : Generalized Anxiety Score  Nervous, Anxious, on Edge 0 0 0 0  Control/stop worrying 0 0 0 0  Worry too much - different things 0 0 0 0  Trouble relaxing 0 0 0 0  Restless 0 0 0 0  Easily annoyed or irritable 0 0 0 0  Afraid - awful might happen 0 0 0 0  Total GAD 7 Score 0 0 0 0  Anxiety Difficulty Not difficult at all Not difficult at all Not difficult at all Not difficult at all       02/04/2023    1:24 PM 07/04/2022   10:04 AM 01/03/2022   11:24 AM  Depression screen PHQ 2/9  Decreased Interest 0 0 0  Down, Depressed, Hopeless 0 0 0  PHQ - 2 Score 0 0 0  Altered sleeping 0 0 0  Tired, decreased energy 0 0 0  Change in appetite 0 0 0  Feeling bad or failure about yourself  0 0 0  Trouble concentrating 0 0 0  Moving slowly or fidgety/restless 0 0 0  Suicidal thoughts 0 0 0  PHQ-9 Score 0 0 0   Difficult doing work/chores Not difficult at all Not difficult at all Not difficult at all    BP Readings from Last 3 Encounters:  02/04/23 120/78  07/04/22 128/78  01/03/22 126/78    Physical Exam Vitals and nursing note reviewed.  HENT:     Head: Normocephalic.     Right Ear: Tympanic membrane and external ear normal.     Left Ear: Tympanic membrane and external ear normal.     Nose: Nose normal. No congestion or rhinorrhea.  Eyes:     General: No scleral icterus.       Right eye: No discharge.        Left eye: No discharge.     Conjunctiva/sclera: Conjunctivae normal.     Pupils: Pupils are equal, round, and reactive to light.  Neck:     Thyroid: No thyromegaly.     Vascular: No JVD.     Trachea: No tracheal deviation.  Cardiovascular:     Rate and Rhythm: Normal rate and regular rhythm.     Heart sounds: Normal heart sounds. No murmur heard.    No friction rub. No gallop.  Pulmonary:     Effort: No respiratory distress.     Breath sounds: Normal breath sounds. No wheezing, rhonchi or rales.  Abdominal:     General: Bowel sounds are normal.     Palpations: Abdomen is soft. There is no mass.     Tenderness: There is no abdominal tenderness. There is no guarding or rebound.  Musculoskeletal:        General: No tenderness. Normal range of motion.     Cervical back: Normal range of motion and neck supple.  Lymphadenopathy:     Cervical: No cervical adenopathy.  Skin:    General: Skin is warm.     Findings: No rash.  Neurological:     Mental Status: He is alert and oriented to person, place, and time.     Cranial Nerves: No cranial nerve deficit.     Deep Tendon Reflexes: Reflexes are normal and symmetric.     Wt Readings from Last 3 Encounters:  02/04/23 225 lb 9.6 oz (102.3 kg)  07/04/22 213 lb (96.6 kg)  01/03/22 207 lb (93.9 kg)    BP 120/78   Pulse 77   Ht 5\' 10"  (1.778 m)   Wt 225 lb 9.6 oz (102.3 kg)   SpO2 100%   BMI 32.37 kg/m   Assessment  and Plan:  1. Hypercholesterolemia Chronic.  Controlled.  Stable.  Asymptomatic without myalgias or muscle weakness.  Continue atorvastatin 10 mg once a day.  Will check lipid panel for current status of LDL control. - atorvastatin (LIPITOR) 10 MG tablet; TAKE 1 TABLET(10 MG) BY MOUTH DAILY  Dispense: 30 tablet; Refill: 5 - Lipid Panel With LDL/HDL Ratio  2. Essential (primary) hypertension (Primary) Chronic.  Controlled.  Stable.  Blood pressure 120/78.  Asymptomatic.  Tolerating medication well.  Continue lisinopril 40 mg once a day.  Will recheck CMP for electrolytes and GFR. - atorvastatin (LIPITOR) 10 MG tablet; TAKE 1 TABLET(10 MG) BY MOUTH DAILY  Dispense: 30 tablet; Refill: 5 - lisinopril (ZESTRIL) 40 MG tablet; TAKE 1 TABLET(40 MG) BY MOUTH DAILY  Dispense: 30 tablet; Refill: 0 - Comprehensive metabolic panel  3. Gastroesophageal reflux disease without esophagitis Chronic.  Controlled.  Stable.  Continue pantoprazole 40 mg once a day.  Will recheck in 4 months. - pantoprazole (PROTONIX) 40 MG tablet; TAKE 1 TABLET(40 MG) BY MOUTH DAILY  Dispense: 30 tablet; Refill: 5  4. Vasculogenic erectile dysfunction, unspecified vasculogenic erectile dysfunction type Chronic.  Symptomatic relief with Cialis 20 mg as needed 1/2 to 1 tablet. - tadalafil (CIALIS) 20 MG tablet; TAKE  1/2-1 TABLET BY MOUTH EVERY OTHER DAY AS NEEDED FOR ERECTILE DYSFUNCTION  Dispense: 20 tablet; Refill: 5  5. 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  Controlled.  Stable.  Resolution of testosterone deficiency symptomatology with testosterone gel pump 1.62% 1 pump and scan daily will recheck in 6 months. - Testosterone (ANDROGEL PUMP) 20.25 MG/ACT (1.62%) GEL; Place 1 Pump onto the skin daily. Apply 1 pump to the forearm daily  Dispense: 75 g; Refill: 5  6. Influenza vaccine needed Discussed and administered - Flu vaccine trivalent PF, 6mos and older(Flulaval,Afluria,Fluarix,Fluzone)  7. Need for COVID-19  vaccine Discussed and administered - Pfizer Comirnaty Covid -19 Vaccine 42yrs and older    Elizabeth Sauer, MD

## 2023-02-06 ENCOUNTER — Encounter: Payer: Self-pay | Admitting: Family Medicine

## 2023-02-06 LAB — COMPREHENSIVE METABOLIC PANEL
ALT: 16 [IU]/L (ref 0–44)
AST: 16 [IU]/L (ref 0–40)
Albumin: 4.6 g/dL (ref 3.8–4.9)
Alkaline Phosphatase: 92 [IU]/L (ref 44–121)
BUN/Creatinine Ratio: 22 (ref 10–24)
BUN: 19 mg/dL (ref 8–27)
Bilirubin Total: 0.7 mg/dL (ref 0.0–1.2)
CO2: 23 mmol/L (ref 20–29)
Calcium: 9.2 mg/dL (ref 8.6–10.2)
Chloride: 104 mmol/L (ref 96–106)
Creatinine, Ser: 0.87 mg/dL (ref 0.76–1.27)
Globulin, Total: 1.9 g/dL (ref 1.5–4.5)
Glucose: 78 mg/dL (ref 70–99)
Potassium: 4 mmol/L (ref 3.5–5.2)
Sodium: 143 mmol/L (ref 134–144)
Total Protein: 6.5 g/dL (ref 6.0–8.5)
eGFR: 99 mL/min/{1.73_m2} (ref >59)

## 2023-02-06 LAB — LIPID PANEL WITH LDL/HDL RATIO
Cholesterol, Total: 173 mg/dL (ref 100–199)
HDL: 69 mg/dL (ref >39)
LDL Chol Calc (NIH): 77 mg/dL (ref 0–99)
LDL/HDL Ratio: 1.1 {ratio} (ref 0.0–3.6)
Triglycerides: 161 mg/dL — ABNORMAL HIGH (ref 0–149)
VLDL Cholesterol Cal: 27 mg/dL (ref 5–40)

## 2023-02-11 ENCOUNTER — Other Ambulatory Visit: Payer: Self-pay

## 2023-02-21 ENCOUNTER — Other Ambulatory Visit: Payer: Self-pay | Admitting: Family Medicine

## 2023-02-21 DIAGNOSIS — I10 Essential (primary) hypertension: Secondary | ICD-10-CM

## 2023-03-24 ENCOUNTER — Other Ambulatory Visit: Payer: Self-pay | Admitting: Family Medicine

## 2023-03-24 DIAGNOSIS — I10 Essential (primary) hypertension: Secondary | ICD-10-CM

## 2023-03-26 NOTE — Telephone Encounter (Signed)
Requested Prescriptions  Refused Prescriptions Disp Refills   lisinopril (ZESTRIL) 40 MG tablet [Pharmacy Med Name: LISINOPRIL 40MG  TABLETS] 90 tablet 1    Sig: TAKE 1 TABLET(40 MG) BY MOUTH DAILY     Cardiovascular:  ACE Inhibitors Passed - 03/26/2023  7:44 AM      Passed - Cr in normal range and within 180 days    Creatinine, Ser  Date Value Ref Range Status  02/05/2023 0.87 0.76 - 1.27 mg/dL Final         Passed - K in normal range and within 180 days    Potassium  Date Value Ref Range Status  02/05/2023 4.0 3.5 - 5.2 mmol/L Final         Passed - Patient is not pregnant      Passed - Last BP in normal range    BP Readings from Last 1 Encounters:  02/04/23 120/78         Passed - Valid encounter within last 6 months    Recent Outpatient Visits           1 month ago Essential (primary) hypertension   [place] [facility]   8 months ago Essential (primary) hypertension   [place] [facility]   1 year ago Essential (primary) hypertension   [place] [facility]   1 year ago COVID   [facility]   1 year ago Acute non-recurrent sinusitis, unspecified location   [facility]       Future Appointments             In 3 months Duanne Limerick, MD [facility] at [facility], [facility]

## 2023-04-22 ENCOUNTER — Ambulatory Visit: Payer: 59 | Attending: Physical Medicine & Rehabilitation | Admitting: Physical Therapy

## 2023-04-22 ENCOUNTER — Encounter: Payer: Self-pay | Admitting: Physical Therapy

## 2023-04-22 DIAGNOSIS — R2681 Unsteadiness on feet: Secondary | ICD-10-CM | POA: Diagnosis present

## 2023-04-22 DIAGNOSIS — R2689 Other abnormalities of gait and mobility: Secondary | ICD-10-CM | POA: Insufficient documentation

## 2023-04-22 DIAGNOSIS — R262 Difficulty in walking, not elsewhere classified: Secondary | ICD-10-CM | POA: Diagnosis present

## 2023-04-22 DIAGNOSIS — R278 Other lack of coordination: Secondary | ICD-10-CM | POA: Insufficient documentation

## 2023-04-22 NOTE — Therapy (Unsigned)
 OUTPATIENT PHYSICAL THERAPY BALANCE/NEURO EVALUATION   Patient Name: Tyler Glover MRN: [id number] DOB:1960/07/23, 63 y.o., male Today's Date: 04/22/2023   PT End of Session - 04/22/23 1554     Visit Number 1    Number of Visits 17    Date for PT Re-Evaluation 06/20/23    PT Start Time 1555    PT Stop Time 1630    PT Time Calculation (min) 35 min    Equipment Utilized During Treatment Gait belt    Activity Tolerance Patient tolerated treatment well    Behavior During Therapy WFL for tasks assessed/performed             Past Medical History:  Diagnosis Date   Allergy    Arthritis    Rheumatoid   GERD (gastroesophageal reflux disease)    Hypertension    Past Surgical History:  Procedure Laterality Date   COLONOSCOPY     COLONOSCOPY WITH PROPOFOL N/A 08/21/2019   Procedure: COLONOSCOPY WITH PROPOFOL;  Surgeon: Midge Minium, MD;  Location: [facility];  Service: Endoscopy;  Laterality: N/A;  priority 4   ESOPHAGOGASTRODUODENOSCOPY (EGD) WITH PROPOFOL N/A 08/21/2019   Procedure: ESOPHAGOGASTRODUODENOSCOPY (EGD) WITH PROPOFOL;  Surgeon: Midge Minium, MD;  Location: [facility];  Service: Endoscopy;  Laterality: N/A;   POLYPECTOMY  08/21/2019   Procedure: POLYPECTOMY;  Surgeon: Midge Minium, MD;  Location: [facility];  Service: Endoscopy;;   SHOULDER SURGERY     TESTICLE SURGERY     age 42 or 89   TONSILLECTOMY     Patient Active Problem List   Diagnosis Date Noted   Encounter for screening colonoscopy    Polyp of transverse colon    Heartburn    Pure hypercholesterolemia 01/26/2016   Right lateral epicondylitis 07/07/2015   Vitamin D deficiency 12/24/2014   Prediabetes 12/24/2014   Allergic rhinitis, seasonal 06/03/2014   Acid reflux 06/03/2014   Essential (primary) hypertension 06/03/2014   Family history of diabetes mellitus 06/03/2014   Family history of cardiac disorder 06/03/2014   Obesity (BMI 30.0-34.9) 06/03/2014    PCP: Duanne Limerick, MD  REFERRING PROVIDER: Arminda Resides, MD  REFERRING DIAGNOSIS:  G20.A1 (ICD-10-CM) - Parkinson's disease (HCC)    THERAPY DIAG: Difficulty in walking, not elsewhere classified  Unsteadiness on feet  Other abnormalities of gait and mobility  ONSET DATE: 04/2022  FOLLOW UP APPT WITH PROVIDER: Yes, f/u with referring provider in July 2025   RATIONALE FOR EVALUATION AND TREATMENT: Rehabilitation  SUBJECTIVE:  Chief Complaint: Patient is a 63 year old male referred for gait/mobility deficits secondary to Parkinson's disease.   Pertinent History Patient is a 63 year old male referred for gait/mobility deficits secondary to Parkinson's disease. Using amantadine for PD symptoms. Patient reports L side of his body is getting weaker. He reports he wants to be able to increase his activity. Pt reports comorbid low back pain and hip pain. Patient reports responding relatively well with monthly infusions of RA. Pt reports his LLE tends to drag - concerned for it catching on carpet.   Pain: Yes; multi-site associated with RA; low back and comorbid L knee pain.  Numbness/Tingling: No Focal Weakness: Yes; L-sided weakness, LE worse than UE Recent changes in overall health/medication: No Prior history of physical therapy for balance:  Yes; previous PT in [facility] outpatient clinic  Falls: Has patient fallen in last 6 months? No, Number of falls: N/A Directional pattern for falls: No Dominant hand: right Imaging: Yes ;  Normal brain MRI in 2023;   Prior level of function: Independent Occupational demands: IT trainer - M-F; p thas to hold batteries, alternators, starters Hobbies: Traveling  Red flags (bowel/bladder changes, saddle paresthesia, personal history of cancer, h/o spinal tumors,  h/o compression fx, h/o abdominal aneurysm, abdominal pain, chills/fever, night sweats, nausea, vomiting, unrelenting pain): Negative  Precautions: None  Weight Bearing Restrictions: No  Living Environment Lives with: lives with their spouse and mother  Lives in: House/apartment; one step to get into home; stairs to get into home, pt has upstairs bedroom. Pt reports doing fairly well with stairs. Concrete from his car up to front steps  Has following equipment at home: None   Patient Goals: Improved mobility in L leg and L arm, improved strength in low back.     OBJECTIVE:   Patient Surveys  ABC: 91.3%   Cognition Patient is oriented to person, place, and time.  Recent memory is intact.  Remote memory is intact.  Attention span and concentration are intact.  Expressive speech is intact.  Patient's fund of knowledge is within normal limits for educational level.     Gross Musculoskeletal Assessment Tremor: No resting tremor  Bulk: Normal Tone: Cogwheel rigidity LE > UE. Hamstrings are spastic on L>R, Modified Ashworth 2 and 1, respectively.   GAIT: Distance walked: 80 ft Assistive device utilized: None Level of assistance: SBA Comments: Decreased arm swing, decreased trunk rotation with arm/leg swing, LLE decreased heel strike/mid-foot initial contact   Posture: Forward head, rounded shoulders    AROM  AROM (Normal range in degrees) AROM  04/22/2023  Lumbar   Flexion (65) 75%*  Extension (30) 50% no pain  Right lateral flexion (25) 75% (R-sided discomfort)  Left lateral flexion (25) 75% (R-sided discomfort)  Right rotation (30) 50%  Left rotation (30) 25%          Ankle    Dorsiflexion (20) WFL WFL  Plantarflexion (50)    Inversion (35)    Eversion (15    (* = pain; Blank rows = not tested)   LE MMT:  MMT (out of 5) Right 04/22/2023 Left 04/22/2023  Hip flexion 4- 3+  Hip extension    Hip abduction (seated) 5 5  Hip adduction (seated) 5 5  Hip  internal rotation    Hip external rotation    Knee flexion 5 4  Knee extension 5 4  Ankle dorsiflexion 5 4-  Ankle plantarflexion    Ankle inversion    Ankle eversion    (* =  pain; Blank rows = not tested)   Sensation Deferred   Reflexes Deferred   Cranial Nerves Deferred   Coordination/Cerebellar Finger to Nose: L-sided hypometria  Heel to Shin: L-sided impairment  Rapid alternating movements: Impaired L>R, mild impairment LE toe tapping  Finger Opposition: WNL Pronator Drift: Positive LUE   FUNCTIONAL OUTCOME MEASURES   Results Comments  BERG Next visit/56 Fall risk, in need of intervention  DGI Next visit/24   TUG 13.6 seconds   5TSTS 35.2 seconds   (Blank rows = not tested)   Postural Control Screen Romberg: EO WNL, EC up to 30 sec with significant sway and near-LOB x 2  TODAY'S TREATMENT   Patient education on current condition, anatomy involved, prognosis, plan of care.     PATIENT EDUCATION:  Education details: see above for patient education details Person educated: Patient Education method: Explanation Education comprehension: verbalized understanding   HOME EXERCISE PROGRAM: Formal HEP to be issued next visit.    ASSESSMENT:  CLINICAL IMPRESSION: Patient is a 63 y.o. male who was seen today for physical therapy evaluation and treatment for gait and mobility deficits related to Parkinson's disease. Symptoms are well controlled with amantadine. Patient is community-level ambulator who does not use AD; he teaches automotive classes during weekdays. Pt has primarily L-sided weakness and report of decreased toe clearance on L; during evaluation, he does demonstrate diminished L heel strike with largely mid-foot initial contact. Pt has comorbid back pain with no S&S consistent with classic radiculopathy. Objective impairments include Abnormal gait, decreased balance, decreased coordination, difficulty walking, decreased strength, impaired tone,  postural dysfunction, and pain. These impairments are limiting patient from shopping, community activity, and occupation. Personal factors including Profession, Time since onset of injury/illness/exacerbation, and 3+ comorbidities: (primary Parkinsonism, RA, HTN, GERD)  are also affecting patient's functional outcome. Patient will benefit from skilled PT to address above impairments and improve overall function.  REHAB POTENTIAL: Good  CLINICAL DECISION MAKING: Evolving/moderate complexity  EVALUATION COMPLEXITY: High   GOALS: Goals reviewed with patient? Yes  SHORT TERM GOALS: Target date: 05/16/2023  Pt will be independent with HEP in order to improve strength and balance in order to decrease fall risk and improve function at home. Baseline: 04/22/23: Formal HEP to be issued on visit # 2. Goal status: INITIAL   LONG TERM GOALS: Target date: 06/20/2023  Patient will improve MMT of tested LLE musculature to 4+/5 or greater indicative of improved strength as needed for power production to prevent fall and to improve performance of transfers and stair negotiation.  Baseline: 04/22/23: LLE gross strength 3+ to 4 (exception of seated hip ABD/ADD 5/5, not tested in standard testing position).  Goal status: INITIAL  2.  Pt will improve BERG by at least 3 points in order to demonstrate clinically significant improvement in balance.   Baseline: 04/22/23: To be completed on visit # 2 Goal status: INITIAL  3. Pt will decrease 5TSTS by at least 3 seconds in order to demonstrate clinically significant improvement in LE strength      Baseline: 04/22/23: 35.2 sec  Goal status: INITIAL  4. Pt will improve DGI by at least 3 points in order to demonstrate clinically significant improvement in balance and decreased risk for falls.     Baseline: 04/22/23: To be completed on visit # 2 Goal status: INITIAL  5. Pt will complete TUG in less than 11.5 sec indicative of decreased fall risk per clinical  population Baseline: 04/22/23: 13.6 sec  Goal status: INITIAL  PLAN: PT FREQUENCY: 2x/week  PT DURATION: 8-10 weeks  PLANNED INTERVENTIONS: Therapeutic exercises, Therapeutic activity, Neuromuscular re-education, Balance training, Gait training, Patient/Family education, Joint manipulation, Joint mobilization, Canalith repositioning, Aquatic Therapy, Dry Needling, Cognitive remediation, Electrical stimulation, Spinal manipulation, Spinal mobilization, Cryotherapy, Moist heat, Traction, Ultrasound, Ionotophoresis 4mg /ml Dexamethasone, and Manual therapy  PLAN FOR NEXT SESSION: Complete BERG, DGI; further fall risk testing as indicated. Continue with postural control training, LE coordination training for toe clearance and stable weight shift, LE strengthening. Large-amplitude movements for HEP.    Tyler Glover, PT, DPT #[id number]  Gertie Exon 04/22/2023, 3:55 PM

## 2023-04-24 ENCOUNTER — Ambulatory Visit: Payer: 59 | Admitting: Physical Therapy

## 2023-04-24 ENCOUNTER — Encounter: Payer: Self-pay | Admitting: Physical Therapy

## 2023-04-24 DIAGNOSIS — R2689 Other abnormalities of gait and mobility: Secondary | ICD-10-CM

## 2023-04-24 DIAGNOSIS — R262 Difficulty in walking, not elsewhere classified: Secondary | ICD-10-CM | POA: Diagnosis not present

## 2023-04-24 DIAGNOSIS — R2681 Unsteadiness on feet: Secondary | ICD-10-CM

## 2023-04-24 DIAGNOSIS — R278 Other lack of coordination: Secondary | ICD-10-CM

## 2023-04-24 NOTE — Therapy (Signed)
 OUTPATIENT PHYSICAL THERAPY TREATMENT   Patient Name: Tyler Glover MRN: [id number] DOB:07-Oct-1960, 63 y.o., male Today's Date: 04/24/2023   PT End of Session - 04/24/23 1509     Visit Number 2    Number of Visits 17    Date for PT Re-Evaluation 06/20/23    PT Start Time 1506    PT Stop Time 1546    PT Time Calculation (min) 40 min    Equipment Utilized During Treatment Gait belt    Activity Tolerance Patient tolerated treatment well    Behavior During Therapy WFL for tasks assessed/performed              Past Medical History:  Diagnosis Date   Allergy    Arthritis    Rheumatoid   GERD (gastroesophageal reflux disease)    Hypertension    Past Surgical History:  Procedure Laterality Date   COLONOSCOPY     COLONOSCOPY WITH PROPOFOL N/A 08/21/2019   Procedure: COLONOSCOPY WITH PROPOFOL;  Surgeon: Midge Minium, MD;  Location: [facility];  Service: Endoscopy;  Laterality: N/A;  priority 4   ESOPHAGOGASTRODUODENOSCOPY (EGD) WITH PROPOFOL N/A 08/21/2019   Procedure: ESOPHAGOGASTRODUODENOSCOPY (EGD) WITH PROPOFOL;  Surgeon: Midge Minium, MD;  Location: [facility];  Service: Endoscopy;  Laterality: N/A;   POLYPECTOMY  08/21/2019   Procedure: POLYPECTOMY;  Surgeon: Midge Minium, MD;  Location: [facility];  Service: Endoscopy;;   SHOULDER SURGERY     TESTICLE SURGERY     age 77 or 67   TONSILLECTOMY     Patient Active Problem List   Diagnosis Date Noted   Encounter for screening colonoscopy    Polyp of transverse colon    Heartburn    Pure hypercholesterolemia 01/26/2016   Right lateral epicondylitis 07/07/2015   Vitamin D deficiency 12/24/2014   Prediabetes 12/24/2014   Allergic rhinitis, seasonal 06/03/2014   Acid reflux 06/03/2014   Essential (primary) hypertension 06/03/2014   Family history of diabetes mellitus 06/03/2014   Family history of cardiac disorder 06/03/2014   Obesity (BMI 30.0-34.9) 06/03/2014    PCP: Duanne Limerick,  MD  REFERRING PROVIDER: Arminda Resides, MD  REFERRING DIAGNOSIS:  G20.A1 (ICD-10-CM) - Parkinson's disease (HCC)    THERAPY DIAG: Difficulty in walking, not elsewhere classified  Unsteadiness on feet  Other abnormalities of gait and mobility  Other lack of coordination  ONSET DATE: 02/2023  FOLLOW UP APPT WITH PROVIDER: Yes, f/u with referring provider in July 2025   RATIONALE FOR EVALUATION AND TREATMENT: Rehabilitation  Pertinent History Patient is a 63 year old male referred for gait/mobility deficits secondary to Parkinson's disease. Using amantadine for PD symptoms. Patient reports L side of his body is getting weaker. He reports he wants to be able to increase his activity. Pt reports comorbid low back pain and hip pain. Patient reports responding relatively well with monthly infusions of RA. Pt reports his LLE tends to drag - concerned for it catching on carpet.   Pain: Yes; multi-site associated with RA; low back and comorbid L knee pain.  Numbness/Tingling: No Focal Weakness: Yes; L-sided weakness, LE worse than UE Recent changes in overall health/medication: No Prior history of physical therapy for balance:  Yes; previous PT in [facility] outpatient clinic  Falls: Has patient fallen in last 6 months? No, Number of falls: N/A Directional pattern for falls: No Dominant hand: right Imaging: Yes ;  Normal brain MRI in 2023;   Prior level of function: Independent Occupational demands: IT trainer - M-F; p  thas to hold batteries, alternators, starters Hobbies: Traveling  Red flags (bowel/bladder changes, saddle paresthesia, personal history of cancer, h/o spinal tumors, h/o compression fx, h/o abdominal aneurysm, abdominal pain, chills/fever, night sweats, nausea, vomiting, unrelenting pain): Negative  Precautions: None  Weight Bearing Restrictions: No  Living Environment Lives with: lives with their spouse and mother  Lives in: House/apartment; one step to  get into home; stairs to get into home, pt has upstairs bedroom. Pt reports doing fairly well with stairs. Concrete from his car up to front steps  Has following equipment at home: None   Patient Goals: Improved mobility in L leg and L arm, improved strength in low back.     OBJECTIVE:   Patient Surveys  ABC: 91.3%  Cognition Patient is oriented to person, place, and time.  Recent memory is intact.  Remote memory is intact.  Attention span and concentration are intact.  Expressive speech is intact.  Patient's fund of knowledge is within normal limits for educational level.   Gross Musculoskeletal Assessment Tremor: No resting tremor  Bulk: Normal Tone: Cogwheel rigidity LE > UE. Hamstrings are spastic on L>R, Modified Ashworth 2 and 1, respectively.  GAIT: Distance walked: 80 ft Assistive device utilized: None Level of assistance: SBA Comments: Decreased arm swing, decreased trunk rotation with arm/leg swing, LLE decreased heel strike/mid-foot initial contact  Posture: Forward head, rounded shoulders   AROM  AROM (Normal range in degrees) AROM  04/22/2023  Lumbar   Flexion (65) 75%*  Extension (30) 50% no pain  Right lateral flexion (25) 75% (R-sided discomfort)  Left lateral flexion (25) 75% (R-sided discomfort)  Right rotation (30) 50%  Left rotation (30) 25%          Ankle    Dorsiflexion (20) WFL WFL  Plantarflexion (50)    Inversion (35)    Eversion (15    (* = pain; Blank rows = not tested)   LE MMT:  MMT (out of 5) Right 04/22/2023 Left 04/22/2023  Hip flexion 4- 3+  Hip extension    Hip abduction (seated) 5 5  Hip adduction (seated) 5 5  Hip internal rotation    Hip external rotation    Knee flexion 5 4  Knee extension 5 4  Ankle dorsiflexion 5 4-  Ankle plantarflexion    Ankle inversion    Ankle eversion    (* = pain; Blank rows = not tested)   Coordination/Cerebellar Finger to Nose: L-sided hypometria  Heel to Shin: L-sided  impairment  Rapid alternating movements: Impaired L>R, mild impairment LE toe tapping  Finger Opposition: WNL Pronator Drift: Positive LUE   FUNCTIONAL OUTCOME MEASURES (initial eval)   Results Comments  BERG 50/56 Fall risk, in need of intervention  DGI Next visit/24   TUG 13.6 seconds   5TSTS 35.2 seconds   (Blank rows = not tested)   Postural Control Screen Romberg: EO WNL, EC up to 30 sec with significant sway and near-LOB x 2   Lumbar Spine Screen (04/24/23) SLUMP: Negative SLR: Negative Repeated extension in standing: increases, worse pain locally along low back    TODAY'S TREATMENT    SUBJECTIVE STATEMENT:   Patient reports back and bilat hip pain more so today than yesterday. Patient reports no recent near-fall episodes. Patient reports one fall > 6 mos ago when he was running quickly into house - he attempted to jump into house and caught toe on step.     Therapeutic Exercise - improved strength as needed to  improve performance of CKC activities/functional movements and as needed for power production to prevent fall during episode of large postural perturbation   NuStep; Level 3, x 5 minutes - for improved soft tissue mobility and increased tissue temperature to improve muscle performance   -subjective gathered during this time  L-spine screen* (see above)  Lower trunk rotations; x10 ea dir, alternating R/L Supine figure-4 piriformis stretch, pull; 2 x 30 sec, bilat Seated hamstring stretch;    Neuromuscular Re-education - fall risk screening/management; for improved sensory integration, static and dynamic postural control, equilibrium and non-equilibrium coordination as needed for negotiating home and community environment and stepping over obstacles   *BERG completed   -discussed pt being above cut-off score and plan for continued fall risk assessment/DGI next visit    PATIENT EDUCATION:  Education details: see above for patient education  details Person educated: Patient Education method: Explanation Education comprehension: verbalized understanding   HOME EXERCISE PROGRAM: Access Code: [email or web address] URL: [email or web address] Date: 04/24/2023 Prepared by: Consuela Mimes  Exercises - Sit to Stand Without Arm Support  - 2 x daily - 7 x weekly - 2-3 sets - 5-10 reps - Heel Toe Raises with Counter Support  - 2 x daily - 7 x weekly - 2 sets - 10 reps - Standing March with Counter Support  - 2 x daily - 7 x weekly - 2 sets - 10 reps   Access Code: [email or web address] URL: [email or web address] Date: 04/24/2023 Prepared by: Consuela Mimes  Exercises - Supine Lower Trunk Rotation  - 2 x daily - 7 x weekly - 2 sets - 10 reps - Supine Piriformis Stretch with Foot on Ground  - 2 x daily - 7 x weekly - 3 sets - 30sec hold - Seated Hamstring Stretch  - 2 x daily - 7 x weekly - 3 sets - 30sec hold   ASSESSMENT:  CLINICAL IMPRESSION: Patient is largely limited by comorbid back pain. No findings per subjective exam or objective measures/testing consistent with classic radiculopathy. He does not have abatement of symptoms or centralization with repeated extension. We reviewed basic exercises for thoracolumbar mobility and hip and posterior chain stretching. Pt has BERG above cut-off of 45. We will need to complete DGI next visit; further fall risk testing as indicated. Patient will benefit from skilled PT to address above impairments and improve overall function.  REHAB POTENTIAL: Good  CLINICAL DECISION MAKING: Evolving/moderate complexity  EVALUATION COMPLEXITY: High   GOALS: Goals reviewed with patient? Yes  SHORT TERM GOALS: Target date: 05/16/2023  Pt will be independent with HEP in order to improve strength and balance in order to decrease fall risk and improve function at home. Baseline: 04/22/23: Formal HEP to be issued on visit # 2.   04/24/23: HEP given for thoracolumbar mobility, reviewed baseline  exercises for LE strengthening/fall risk. Goal status: INITIAL   LONG TERM GOALS: Target date: 06/20/2023  Patient will improve MMT of tested LLE musculature to 4+/5 or greater indicative of improved strength as needed for power production to prevent fall and to improve performance of transfers and stair negotiation.  Baseline: 04/22/23: LLE gross strength 3+ to 4 (exception of seated hip ABD/ADD 5/5, not tested in standard testing position).  Goal status: INITIAL  2.  Pt will improve BERG by at least 3 points in order to demonstrate clinically significant improvement in balance.   Baseline: 04/22/23: To be completed on visit # 2.    04/24/23: 50/56    Goal status: INITIAL  3. Pt  will decrease 5TSTS by at least 3 seconds in order to demonstrate clinically significant improvement in LE strength      Baseline: 04/22/23: 35.2 sec  Goal status: INITIAL  4. Pt will improve DGI by at least 3 points in order to demonstrate clinically significant improvement in balance and decreased risk for falls.     Baseline: 04/22/23: To be completed on visit # 3.   Goal status: INITIAL  5. Pt will complete TUG in less than 11.5 sec indicative of decreased fall risk per clinical population Baseline: 04/22/23: 13.6 sec  Goal status: INITIAL   PLAN: PT FREQUENCY: 2x/week  PT DURATION: 8-10 weeks  PLANNED INTERVENTIONS: Therapeutic exercises, Therapeutic activity, Neuromuscular re-education, Balance training, Gait training, Patient/Family education, Joint manipulation, Joint mobilization, Canalith repositioning, Aquatic Therapy, Dry Needling, Cognitive remediation, Electrical stimulation, Spinal manipulation, Spinal mobilization, Cryotherapy, Moist heat, Traction, Ultrasound, Ionotophoresis 4mg /ml Dexamethasone, and Manual therapy  PLAN FOR NEXT SESSION: Complete DGI; further fall risk testing as indicated. Continue with postural control training, LE coordination training for toe clearance and stable weight shift,  LE strengthening. Large-amplitude movements for HEP.    Consuela Mimes, PT, DPT #[id number]  Gertie Exon 04/24/2023, 3:10 PM

## 2023-04-29 ENCOUNTER — Encounter: Payer: Self-pay | Admitting: Physical Therapy

## 2023-04-29 ENCOUNTER — Ambulatory Visit: Payer: 59 | Admitting: Physical Therapy

## 2023-04-29 DIAGNOSIS — R262 Difficulty in walking, not elsewhere classified: Secondary | ICD-10-CM | POA: Diagnosis not present

## 2023-04-29 DIAGNOSIS — R2681 Unsteadiness on feet: Secondary | ICD-10-CM

## 2023-04-29 DIAGNOSIS — R2689 Other abnormalities of gait and mobility: Secondary | ICD-10-CM

## 2023-04-29 NOTE — Therapy (Unsigned)
 OUTPATIENT PHYSICAL THERAPY TREATMENT   Patient Name: Tyler Glover MRN: [id number] DOB:[birth year], 63 y.o., male Today's Date: 04/29/2023   PT End of Session - 05/01/23 1239     Visit Number 3    Number of Visits 17    Date for PT Re-Evaluation 06/20/23    PT Start Time 1716    PT Stop Time 1800    PT Time Calculation (min) 44 min    Equipment Utilized During Treatment Gait belt    Activity Tolerance Patient tolerated treatment well    Behavior During Therapy WFL for tasks assessed/performed                Past Medical History:  Diagnosis Date   Allergy    Arthritis    Rheumatoid   GERD (gastroesophageal reflux disease)    Hypertension    Past Surgical History:  Procedure Laterality Date   COLONOSCOPY     COLONOSCOPY WITH PROPOFOL N/A 08/21/2019   Procedure: COLONOSCOPY WITH PROPOFOL;  Surgeon: Midge Minium, MD;  Location: [facility];  Service: Endoscopy;  Laterality: N/A;  priority 4   ESOPHAGOGASTRODUODENOSCOPY (EGD) WITH PROPOFOL N/A 08/21/2019   Procedure: ESOPHAGOGASTRODUODENOSCOPY (EGD) WITH PROPOFOL;  Surgeon: Midge Minium, MD;  Location: [facility];  Service: Endoscopy;  Laterality: N/A;   POLYPECTOMY  08/21/2019   Procedure: POLYPECTOMY;  Surgeon: Midge Minium, MD;  Location: [facility];  Service: Endoscopy;;   SHOULDER SURGERY     TESTICLE SURGERY     age 76 or 72   TONSILLECTOMY     Patient Active Problem List   Diagnosis Date Noted   Encounter for screening colonoscopy    Polyp of transverse colon    Heartburn    Pure hypercholesterolemia 01/26/2016   Right lateral epicondylitis 07/07/2015   Vitamin D deficiency 12/24/2014   Prediabetes 12/24/2014   Allergic rhinitis, seasonal 06/03/2014   Acid reflux 06/03/2014   Essential (primary) hypertension 06/03/2014   Family history of diabetes mellitus 06/03/2014   Family history of cardiac disorder 06/03/2014   Obesity (BMI 30.0-34.9) 06/03/2014    PCP: Duanne Limerick,  MD  REFERRING PROVIDER: Arminda Resides, MD  REFERRING DIAGNOSIS:  G20.A1 (ICD-10-CM) - Parkinson's disease (HCC)    THERAPY DIAG: Difficulty in walking, not elsewhere classified  Unsteadiness on feet  Other abnormalities of gait and mobility  ONSET DATE: 02/2023  FOLLOW UP APPT WITH PROVIDER: Yes, f/u with referring provider in July 2025   RATIONALE FOR EVALUATION AND TREATMENT: Rehabilitation  Pertinent History Patient is a 63 year old male referred for gait/mobility deficits secondary to Parkinson's disease. Using amantadine for PD symptoms. Patient reports L side of his body is getting weaker. He reports he wants to be able to increase his activity. Pt reports comorbid low back pain and hip pain. Patient reports responding relatively well with monthly infusions of RA. Pt reports his LLE tends to drag - concerned for it catching on carpet.   Pain: Yes; multi-site associated with RA; low back and comorbid L knee pain.  Numbness/Tingling: No Focal Weakness: Yes; L-sided weakness, LE worse than UE Recent changes in overall health/medication: No Prior history of physical therapy for balance:  Yes; previous PT in [facility] outpatient clinic  Falls: Has patient fallen in last 6 months? No, Number of falls: N/A Directional pattern for falls: No Dominant hand: right Imaging: Yes ;  Normal brain MRI in 2023;   Prior level of function: Independent Occupational demands: IT trainer - M-F; p thas to hold  batteries, alternators, starters Hobbies: Traveling  Red flags (bowel/bladder changes, saddle paresthesia, personal history of cancer, h/o spinal tumors, h/o compression fx, h/o abdominal aneurysm, abdominal pain, chills/fever, night sweats, nausea, vomiting, unrelenting pain): Negative  Precautions: None  Weight Bearing Restrictions: No  Living Environment Lives with: lives with their spouse and mother  Lives in: House/apartment; one step to get into home; stairs to get  into home, pt has upstairs bedroom. Pt reports doing fairly well with stairs. Concrete from his car up to front steps  Has following equipment at home: None   Patient Goals: Improved mobility in L leg and L arm, improved strength in low back.     OBJECTIVE:   Patient Surveys  ABC: 91.3%  Cognition Patient is oriented to person, place, and time.  Recent memory is intact.  Remote memory is intact.  Attention span and concentration are intact.  Expressive speech is intact.  Patient's fund of knowledge is within normal limits for educational level.   Gross Musculoskeletal Assessment Tremor: No resting tremor  Bulk: Normal Tone: Cogwheel rigidity LE > UE. Hamstrings are spastic on L>R, Modified Ashworth 2 and 1, respectively.  GAIT: Distance walked: 80 ft Assistive device utilized: None Level of assistance: SBA Comments: Decreased arm swing, decreased trunk rotation with arm/leg swing, LLE decreased heel strike/mid-foot initial contact  Posture: Forward head, rounded shoulders   AROM  AROM (Normal range in degrees) AROM  04/22/2023  Lumbar   Flexion (65) 75%*  Extension (30) 50% no pain  Right lateral flexion (25) 75% (R-sided discomfort)  Left lateral flexion (25) 75% (R-sided discomfort)  Right rotation (30) 50%  Left rotation (30) 25%          Ankle    Dorsiflexion (20) WFL WFL  Plantarflexion (50)    Inversion (35)    Eversion (15    (* = pain; Blank rows = not tested)   LE MMT:  MMT (out of 5) Right 04/22/2023 Left 04/22/2023  Hip flexion 4- 3+  Hip extension    Hip abduction (seated) 5 5  Hip adduction (seated) 5 5  Hip internal rotation    Hip external rotation    Knee flexion 5 4  Knee extension 5 4  Ankle dorsiflexion 5 4-  Ankle plantarflexion    Ankle inversion    Ankle eversion    (* = pain; Blank rows = not tested)   Coordination/Cerebellar Finger to Nose: L-sided hypometria  Heel to Shin: L-sided impairment  Rapid alternating  movements: Impaired L>R, mild impairment LE toe tapping  Finger Opposition: WNL Pronator Drift: Positive LUE   FUNCTIONAL OUTCOME MEASURES (initial eval)   Results Comments  BERG 50/56 Fall risk, in need of intervention  DGI Next visit/24   TUG 13.6 seconds   5TSTS 35.2 seconds   (Blank rows = not tested)   Postural Control Screen Romberg: EO WNL, EC up to 30 sec with significant sway and near-LOB x 2   Lumbar Spine Screen (04/24/23) SLUMP: Negative SLR: Negative Repeated extension in standing: increases, worse pain locally along low back    TODAY'S TREATMENT    SUBJECTIVE STATEMENT:   Patient reports feeling better today versus how he felt at arrival last visit. Patient reports doing okay with exercises reviewed last visit.    Therapeutic Exercise - improved strength as needed to improve performance of CKC activities/functional movements and as needed for power production to prevent fall during episode of large postural perturbation   NuStep; Level 3, x  6 minutes - for improved soft tissue mobility and increased tissue temperature to improve muscle performance   -subjective gathered during this time  PATIENT EDUCATION: Updated HEP to include long forward step with bilat shoulder horiz abduction. Discussed community resources for Parkinson's population and provided information for Rocksteady classes.    Neuromuscular Re-education - fall risk screening/management; for improved sensory integration, static and dynamic postural control, equilibrium and non-equilibrium coordination as needed for negotiating home and community environment and stepping over obstacles   *DGI completed   Forward march in // bars, exaggerated arm and leg swing; 5x D/B   Forward long step with shoulder horizontal abduction, loud verbal count; 2 x 10 with alternating legs  Lateral long step with shoulder horizontal abduction, loud verbal count; 2 x 10 with alternating legs        PATIENT  EDUCATION:  Education details: see above for patient education details Person educated: Patient Education method: Explanation Education comprehension: verbalized understanding   HOME EXERCISE PROGRAM: Access Code: [email or web address] URL: [email or web address] Date: 04/29/2023 Prepared by: Consuela Mimes  Exercises - Sit to Stand Without Arm Support  - 2 x daily - 7 x weekly - 2-3 sets - 5-10 reps - Heel Toe Raises with Counter Support  - 2 x daily - 7 x weekly - 2 sets - 10 reps - Standing March with Counter Support  - 2 x daily - 7 x weekly - 2 sets - 10 reps - Step Forward with Arms Reaching to Sides  - 2 x daily - 7 x weekly - 2 sets - 10 reps   Access Code: [email or web address] URL: [email or web address] Date: 04/24/2023 Prepared by: Consuela Mimes  Exercises - Supine Lower Trunk Rotation  - 2 x daily - 7 x weekly - 2 sets - 10 reps - Supine Piriformis Stretch with Foot on Ground  - 2 x daily - 7 x weekly - 3 sets - 30sec hold - Seated Hamstring Stretch  - 2 x daily - 7 x weekly - 3 sets - 30sec hold   ASSESSMENT:  CLINICAL IMPRESSION: Patient has DGI below cut-off for fall risk, indicating need to harp on dynamic balance and balance with gait to improve safety with community-level mobility. We initiated large stepping drills with shoulder horizontal abduction and updated HEP to include large forward stepping. Pt tolerates session well today and will benefit from further work on gait stability, postural control, and large-amplitude drills to improve bradykinesia/hypokinesia. Patient will benefit from skilled PT to address above impairments and improve overall function.  REHAB POTENTIAL: Good  CLINICAL DECISION MAKING: Evolving/moderate complexity  EVALUATION COMPLEXITY: High   GOALS: Goals reviewed with patient? Yes  SHORT TERM GOALS: Target date: 05/16/2023  Pt will be independent with HEP in order to improve strength and balance in order to decrease fall  risk and improve function at home. Baseline: 04/22/23: Formal HEP to be issued on visit # 2.   04/24/23: HEP given for thoracolumbar mobility, reviewed baseline exercises for LE strengthening/fall risk. Goal status: INITIAL   LONG TERM GOALS: Target date: 06/20/2023  Patient will improve MMT of tested LLE musculature to 4+/5 or greater indicative of improved strength as needed for power production to prevent fall and to improve performance of transfers and stair negotiation.  Baseline: 04/22/23: LLE gross strength 3+ to 4 (exception of seated hip ABD/ADD 5/5, not tested in standard testing position).  Goal status: INITIAL  2.  Pt will improve BERG by at least 3 points in order to demonstrate  clinically significant improvement in balance.   Baseline: 04/22/23: To be completed on visit # 2.    04/24/23: 50/56    Goal status: INITIAL  3. Pt will decrease 5TSTS by at least 3 seconds in order to demonstrate clinically significant improvement in LE strength      Baseline: 04/22/23: 35.2 sec  Goal status: INITIAL  4. Pt will improve DGI by at least 3 points in order to demonstrate clinically significant improvement in balance and decreased risk for falls.     Baseline: 04/22/23: To be completed on visit # 3.  04/29/23: 15/24 Goal status: INITIAL  5. Pt will complete TUG in less than 11.5 sec indicative of decreased fall risk per clinical population Baseline: 04/22/23: 13.6 sec  Goal status: INITIAL   PLAN: PT FREQUENCY: 2x/week  PT DURATION: 8-10 weeks  PLANNED INTERVENTIONS: Therapeutic exercises, Therapeutic activity, Neuromuscular re-education, Balance training, Gait training, Patient/Family education, Joint manipulation, Joint mobilization, Canalith repositioning, Aquatic Therapy, Dry Needling, Cognitive remediation, Electrical stimulation, Spinal manipulation, Spinal mobilization, Cryotherapy, Moist heat, Traction, Ultrasound, Ionotophoresis 4mg /ml Dexamethasone, and Manual therapy  PLAN FOR  NEXT SESSION: Continue with postural control training, LE coordination training for toe clearance and stable weight shift, LE strengthening. Large-amplitude movements for HEP.    Consuela Mimes, PT, DPT #[id number]  Gertie Exon 05/01/2023, 12:39 PM

## 2023-05-01 ENCOUNTER — Ambulatory Visit: Payer: 59 | Admitting: Physical Therapy

## 2023-05-01 DIAGNOSIS — R262 Difficulty in walking, not elsewhere classified: Secondary | ICD-10-CM | POA: Diagnosis not present

## 2023-05-01 DIAGNOSIS — R2689 Other abnormalities of gait and mobility: Secondary | ICD-10-CM

## 2023-05-01 DIAGNOSIS — R2681 Unsteadiness on feet: Secondary | ICD-10-CM

## 2023-05-01 NOTE — Therapy (Unsigned)
 OUTPATIENT PHYSICAL THERAPY TREATMENT   Patient Name: Tyler Glover MRN: [id number] DOB:[birth year], 63 y.o., male Today's Date: 05/01/2023   PT End of Session - 05/01/23 1713     Visit Number 4    Number of Visits 17    Date for PT Re-Evaluation 06/20/23    PT Start Time 1713    PT Stop Time 1754    PT Time Calculation (min) 41 min    Equipment Utilized During Treatment Gait belt    Activity Tolerance Patient tolerated treatment well    Behavior During Therapy WFL for tasks assessed/performed                 Past Medical History:  Diagnosis Date   Allergy    Arthritis    Rheumatoid   GERD (gastroesophageal reflux disease)    Hypertension    Past Surgical History:  Procedure Laterality Date   COLONOSCOPY     COLONOSCOPY WITH PROPOFOL N/A 08/21/2019   Procedure: COLONOSCOPY WITH PROPOFOL;  Surgeon: Midge Minium, MD;  Location: [facility];  Service: Endoscopy;  Laterality: N/A;  priority 4   ESOPHAGOGASTRODUODENOSCOPY (EGD) WITH PROPOFOL N/A 08/21/2019   Procedure: ESOPHAGOGASTRODUODENOSCOPY (EGD) WITH PROPOFOL;  Surgeon: Midge Minium, MD;  Location: [facility];  Service: Endoscopy;  Laterality: N/A;   POLYPECTOMY  08/21/2019   Procedure: POLYPECTOMY;  Surgeon: Midge Minium, MD;  Location: [facility];  Service: Endoscopy;;   SHOULDER SURGERY     TESTICLE SURGERY     age 23 or 51   TONSILLECTOMY     Patient Active Problem List   Diagnosis Date Noted   Encounter for screening colonoscopy    Polyp of transverse colon    Heartburn    Pure hypercholesterolemia 01/26/2016   Right lateral epicondylitis 07/07/2015   Vitamin D deficiency 12/24/2014   Prediabetes 12/24/2014   Allergic rhinitis, seasonal 06/03/2014   Acid reflux 06/03/2014   Essential (primary) hypertension 06/03/2014   Family history of diabetes mellitus 06/03/2014   Family history of cardiac disorder 06/03/2014   Obesity (BMI 30.0-34.9) 06/03/2014    PCP: Duanne Limerick, MD  REFERRING PROVIDER: Arminda Resides, MD  REFERRING DIAGNOSIS:  G20.A1 (ICD-10-CM) - Parkinson's disease (HCC)    THERAPY DIAG: Difficulty in walking, not elsewhere classified  Unsteadiness on feet  Other abnormalities of gait and mobility  ONSET DATE: 02/2023  FOLLOW UP APPT WITH PROVIDER: Yes, f/u with referring provider in July 2025   RATIONALE FOR EVALUATION AND TREATMENT: Rehabilitation  Pertinent History Patient is a 63 year old male referred for gait/mobility deficits secondary to Parkinson's disease. Using amantadine for PD symptoms. Patient reports L side of his body is getting weaker. He reports he wants to be able to increase his activity. Pt reports comorbid low back pain and hip pain. Patient reports responding relatively well with monthly infusions of RA. Pt reports his LLE tends to drag - concerned for it catching on carpet.   Pain: Yes; multi-site associated with RA; low back and comorbid L knee pain.  Numbness/Tingling: No Focal Weakness: Yes; L-sided weakness, LE worse than UE Recent changes in overall health/medication: No Prior history of physical therapy for balance:  Yes; previous PT in [facility] outpatient clinic  Falls: Has patient fallen in last 6 months? No, Number of falls: N/A Directional pattern for falls: No Dominant hand: right Imaging: Yes ;  Normal brain MRI in 2023;   Prior level of function: Independent Occupational demands: IT trainer - M-F; p thas to  hold batteries, alternators, starters Hobbies: Traveling  Red flags (bowel/bladder changes, saddle paresthesia, personal history of cancer, h/o spinal tumors, h/o compression fx, h/o abdominal aneurysm, abdominal pain, chills/fever, night sweats, nausea, vomiting, unrelenting pain): Negative  Precautions: None  Weight Bearing Restrictions: No  Living Environment Lives with: lives with their spouse and mother  Lives in: House/apartment; one step to get into home; stairs  to get into home, pt has upstairs bedroom. Pt reports doing fairly well with stairs. Concrete from his car up to front steps  Has following equipment at home: None   Patient Goals: Improved mobility in L leg and L arm, improved strength in low back.     OBJECTIVE:   Patient Surveys  ABC: 91.3%  Cognition Patient is oriented to person, place, and time.  Recent memory is intact.  Remote memory is intact.  Attention span and concentration are intact.  Expressive speech is intact.  Patient's fund of knowledge is within normal limits for educational level.   Gross Musculoskeletal Assessment Tremor: No resting tremor  Bulk: Normal Tone: Cogwheel rigidity LE > UE. Hamstrings are spastic on L>R, Modified Ashworth 2 and 1, respectively.  GAIT: Distance walked: 80 ft Assistive device utilized: None Level of assistance: SBA Comments: Decreased arm swing, decreased trunk rotation with arm/leg swing, LLE decreased heel strike/mid-foot initial contact  Posture: Forward head, rounded shoulders   AROM  AROM (Normal range in degrees) AROM  04/22/2023  Lumbar   Flexion (65) 75%*  Extension (30) 50% no pain  Right lateral flexion (25) 75% (R-sided discomfort)  Left lateral flexion (25) 75% (R-sided discomfort)  Right rotation (30) 50%  Left rotation (30) 25%          Ankle    Dorsiflexion (20) WFL WFL  Plantarflexion (50)    Inversion (35)    Eversion (15    (* = pain; Blank rows = not tested)   LE MMT:  MMT (out of 5) Right 04/22/2023 Left 04/22/2023  Hip flexion 4- 3+  Hip extension    Hip abduction (seated) 5 5  Hip adduction (seated) 5 5  Hip internal rotation    Hip external rotation    Knee flexion 5 4  Knee extension 5 4  Ankle dorsiflexion 5 4-  Ankle plantarflexion    Ankle inversion    Ankle eversion    (* = pain; Blank rows = not tested)   Coordination/Cerebellar Finger to Nose: L-sided hypometria  Heel to Shin: L-sided impairment  Rapid alternating  movements: Impaired L>R, mild impairment LE toe tapping  Finger Opposition: WNL Pronator Drift: Positive LUE   FUNCTIONAL OUTCOME MEASURES (initial eval)   Results Comments  BERG 50/56 Fall risk, in need of intervention  DGI Next visit/24   TUG 13.6 seconds   5TSTS 35.2 seconds   (Blank rows = not tested)   Postural Control Screen Romberg: EO WNL, EC up to 30 sec with significant sway and near-LOB x 2   Lumbar Spine Screen (04/24/23) SLUMP: Negative SLR: Negative Repeated extension in standing: increases, worse pain locally along low back    TODAY'S TREATMENT    SUBJECTIVE STATEMENT:   Patient reports having pain in "a little big of everything." Patient reports no notable issues with near-falls or functional mobility over this past week. Patient reports tolerating home exercises well - he states "I haven't done any in the last day or two."    Therapeutic Exercise - improved strength as needed to improve performance of CKC activities/functional  movements and as needed for power production to prevent fall during episode of large postural perturbation   NuStep; Level 4, x 5 minutes - for improved soft tissue mobility and increased tissue temperature to improve muscle performance   -subjective gathered during this time  -Moist heat along low back for analgesic effect  PATIENT EDUCATION: Updated HEP to include long forward step with bilat shoulder horiz abduction. Discussed community resources for Parkinson's population and provided information for Rocksteady classes.    Neuromuscular Re-education - fall risk screening/management; for improved sensory integration, static and dynamic postural control, equilibrium and non-equilibrium coordination as needed for negotiating home and community environment and stepping over obstacles     Forward long step with shoulder horizontal abduction, loud verbal count; 2 x 10 with alternating legs  Lateral long step with shoulder horizontal  abduction, loud verbal count; 2 x 10 with alternating legs   Sit to stand with shoulder horizontal abduction, exagerrated posture with HABD at top of transfer held x 10 sec  -performed 2 x 6   Forward march in // bars, exaggerated arm and leg swing; 5x D/B   Toe tapping on 6-inch step; 2x10   Toe tapping on single cone; 2 x 10 alternating R/L     Sharpened Romberg: RLE in back 20 sec, LLE in back 10 sec with multiple attempts   PATIENT EDUCATION:  Education details: see above for patient education details Person educated: Patient Education method: Explanation Education comprehension: verbalized understanding   HOME EXERCISE PROGRAM: Access Code: [email or web address] URL: [email or web address] Date: 04/29/2023 Prepared by: Consuela Mimes  Exercises - Sit to Stand Without Arm Support  - 2 x daily - 7 x weekly - 2-3 sets - 5-10 reps - Heel Toe Raises with Counter Support  - 2 x daily - 7 x weekly - 2 sets - 10 reps - Standing March with Counter Support  - 2 x daily - 7 x weekly - 2 sets - 10 reps - Step Forward with Arms Reaching to Sides  - 2 x daily - 7 x weekly - 2 sets - 10 reps   Access Code: [email or web address] URL: [email or web address] Date: 04/24/2023 Prepared by: Consuela Mimes  Exercises - Supine Lower Trunk Rotation  - 2 x daily - 7 x weekly - 2 sets - 10 reps - Supine Piriformis Stretch with Foot on Ground  - 2 x daily - 7 x weekly - 3 sets - 30sec hold - Seated Hamstring Stretch  - 2 x daily - 7 x weekly - 3 sets - 30sec hold   ASSESSMENT:  CLINICAL IMPRESSION: Patient has DGI below cut-off for fall risk, indicating need to harp on dynamic balance and balance with gait to improve safety with community-level mobility. We initiated large stepping drills with shoulder horizontal abduction and updated HEP to include large forward stepping. Pt tolerates session well today and will benefit from further work on gait stability, postural control, and  large-amplitude drills to improve bradykinesia/hypokinesia. Patient will benefit from skilled PT to address above impairments and improve overall function.  REHAB POTENTIAL: Good  CLINICAL DECISION MAKING: Evolving/moderate complexity  EVALUATION COMPLEXITY: High   GOALS: Goals reviewed with patient? Yes  SHORT TERM GOALS: Target date: 05/16/2023  Pt will be independent with HEP in order to improve strength and balance in order to decrease fall risk and improve function at home. Baseline: 04/22/23: Formal HEP to be issued on visit # 2.   04/24/23: HEP given for thoracolumbar mobility, reviewed baseline exercises for  LE strengthening/fall risk. Goal status: INITIAL   LONG TERM GOALS: Target date: 06/20/2023  Patient will improve MMT of tested LLE musculature to 4+/5 or greater indicative of improved strength as needed for power production to prevent fall and to improve performance of transfers and stair negotiation.  Baseline: 04/22/23: LLE gross strength 3+ to 4 (exception of seated hip ABD/ADD 5/5, not tested in standard testing position).  Goal status: INITIAL  2.  Pt will improve BERG by at least 3 points in order to demonstrate clinically significant improvement in balance.   Baseline: 04/22/23: To be completed on visit # 2.    04/24/23: 50/56    Goal status: INITIAL  3. Pt will decrease 5TSTS by at least 3 seconds in order to demonstrate clinically significant improvement in LE strength      Baseline: 04/22/23: 35.2 sec  Goal status: INITIAL  4. Pt will improve DGI by at least 3 points in order to demonstrate clinically significant improvement in balance and decreased risk for falls.     Baseline: 04/22/23: To be completed on visit # 3.  04/29/23: 15/24 Goal status: INITIAL  5. Pt will complete TUG in less than 11.5 sec indicative of decreased fall risk per clinical population Baseline: 04/22/23: 13.6 sec  Goal status: INITIAL   PLAN: PT FREQUENCY: 2x/week  PT DURATION: 8-10  weeks  PLANNED INTERVENTIONS: Therapeutic exercises, Therapeutic activity, Neuromuscular re-education, Balance training, Gait training, Patient/Family education, Joint manipulation, Joint mobilization, Canalith repositioning, Aquatic Therapy, Dry Needling, Cognitive remediation, Electrical stimulation, Spinal manipulation, Spinal mobilization, Cryotherapy, Moist heat, Traction, Ultrasound, Ionotophoresis 4mg /ml Dexamethasone, and Manual therapy  PLAN FOR NEXT SESSION: Continue with postural control training, LE coordination training for toe clearance and stable weight shift, LE strengthening. Large-amplitude movements for HEP.    Consuela Mimes, PT, DPT #[id number]  Gertie Exon 05/01/2023, 5:15 PM

## 2023-05-02 ENCOUNTER — Encounter: Payer: Self-pay | Admitting: Physical Therapy

## 2023-05-06 ENCOUNTER — Ambulatory Visit: Payer: 59 | Admitting: Physical Therapy

## 2023-05-06 DIAGNOSIS — R262 Difficulty in walking, not elsewhere classified: Secondary | ICD-10-CM | POA: Diagnosis not present

## 2023-05-06 DIAGNOSIS — R2681 Unsteadiness on feet: Secondary | ICD-10-CM

## 2023-05-06 DIAGNOSIS — R2689 Other abnormalities of gait and mobility: Secondary | ICD-10-CM

## 2023-05-06 NOTE — Therapy (Unsigned)
 OUTPATIENT PHYSICAL THERAPY TREATMENT   Patient Name: Tyler Glover MRN: [id number] DOB:[birth year], 63 y.o., male Today's Date: 05/06/2023   PT End of Session - 05/06/23 1720     Visit Number 5    Number of Visits 17    Date for PT Re-Evaluation 06/20/23    PT Start Time 1717    PT Stop Time 1758    PT Time Calculation (min) 41 min    Equipment Utilized During Treatment Gait belt    Activity Tolerance Patient tolerated treatment well    Behavior During Therapy WFL for tasks assessed/performed             Past Medical History:  Diagnosis Date   Allergy    Arthritis    Rheumatoid   GERD (gastroesophageal reflux disease)    Hypertension    Past Surgical History:  Procedure Laterality Date   COLONOSCOPY     COLONOSCOPY WITH PROPOFOL N/A 08/21/2019   Procedure: COLONOSCOPY WITH PROPOFOL;  Surgeon: Midge Minium, MD;  Location: [facility];  Service: Endoscopy;  Laterality: N/A;  priority 4   ESOPHAGOGASTRODUODENOSCOPY (EGD) WITH PROPOFOL N/A 08/21/2019   Procedure: ESOPHAGOGASTRODUODENOSCOPY (EGD) WITH PROPOFOL;  Surgeon: Midge Minium, MD;  Location: [facility];  Service: Endoscopy;  Laterality: N/A;   POLYPECTOMY  08/21/2019   Procedure: POLYPECTOMY;  Surgeon: Midge Minium, MD;  Location: [facility];  Service: Endoscopy;;   SHOULDER SURGERY     TESTICLE SURGERY     age 59 or 66   TONSILLECTOMY     Patient Active Problem List   Diagnosis Date Noted   Encounter for screening colonoscopy    Polyp of transverse colon    Heartburn    Pure hypercholesterolemia 01/26/2016   Right lateral epicondylitis 07/07/2015   Vitamin D deficiency 12/24/2014   Prediabetes 12/24/2014   Allergic rhinitis, seasonal 06/03/2014   Acid reflux 06/03/2014   Essential (primary) hypertension 06/03/2014   Family history of diabetes mellitus 06/03/2014   Family history of cardiac disorder 06/03/2014   Obesity (BMI 30.0-34.9) 06/03/2014    PCP: Duanne Limerick,  MD  REFERRING PROVIDER: Arminda Resides, MD  REFERRING DIAGNOSIS:  G20.A1 (ICD-10-CM) - Parkinson's disease (HCC)    THERAPY DIAG: Difficulty in walking, not elsewhere classified  Unsteadiness on feet  Other abnormalities of gait and mobility  ONSET DATE: 02/2023  FOLLOW UP APPT WITH PROVIDER: Yes, f/u with referring provider in July 2025   RATIONALE FOR EVALUATION AND TREATMENT: Rehabilitation  Pertinent History Patient is a 63 year old male referred for gait/mobility deficits secondary to Parkinson's disease. Using amantadine for PD symptoms. Patient reports L side of his body is getting weaker. He reports he wants to be able to increase his activity. Pt reports comorbid low back pain and hip pain. Patient reports responding relatively well with monthly infusions of RA. Pt reports his LLE tends to drag - concerned for it catching on carpet.   Pain: Yes; multi-site associated with RA; low back and comorbid L knee pain.  Numbness/Tingling: No Focal Weakness: Yes; L-sided weakness, LE worse than UE Recent changes in overall health/medication: No Prior history of physical therapy for balance:  Yes; previous PT in [facility] outpatient clinic  Falls: Has patient fallen in last 6 months? No, Number of falls: N/A Directional pattern for falls: No Dominant hand: right Imaging: Yes ;  Normal brain MRI in 2023;   Prior level of function: Independent Occupational demands: IT trainer - M-F; p thas to hold batteries, alternators, starters  Hobbies: Traveling  Red flags (bowel/bladder changes, saddle paresthesia, personal history of cancer, h/o spinal tumors, h/o compression fx, h/o abdominal aneurysm, abdominal pain, chills/fever, night sweats, nausea, vomiting, unrelenting pain): Negative  Precautions: None  Weight Bearing Restrictions: No  Living Environment Lives with: lives with their spouse and mother  Lives in: House/apartment; one step to get into home; stairs to get  into home, pt has upstairs bedroom. Pt reports doing fairly well with stairs. Concrete from his car up to front steps  Has following equipment at home: None   Patient Goals: Improved mobility in L leg and L arm, improved strength in low back.     OBJECTIVE:   Patient Surveys  ABC: 91.3%  Cognition Patient is oriented to person, place, and time.  Recent memory is intact.  Remote memory is intact.  Attention span and concentration are intact.  Expressive speech is intact.  Patient's fund of knowledge is within normal limits for educational level.   Gross Musculoskeletal Assessment Tremor: No resting tremor  Bulk: Normal Tone: Cogwheel rigidity LE > UE. Hamstrings are spastic on L>R, Modified Ashworth 2 and 1, respectively.  GAIT: Distance walked: 80 ft Assistive device utilized: None Level of assistance: SBA Comments: Decreased arm swing, decreased trunk rotation with arm/leg swing, LLE decreased heel strike/mid-foot initial contact  Posture: Forward head, rounded shoulders   AROM  AROM (Normal range in degrees) AROM  04/22/2023  Lumbar   Flexion (65) 75%*  Extension (30) 50% no pain  Right lateral flexion (25) 75% (R-sided discomfort)  Left lateral flexion (25) 75% (R-sided discomfort)  Right rotation (30) 50%  Left rotation (30) 25%          Ankle    Dorsiflexion (20) WFL WFL  Plantarflexion (50)    Inversion (35)    Eversion (15    (* = pain; Blank rows = not tested)   LE MMT:  MMT (out of 5) Right 04/22/2023 Left 04/22/2023  Hip flexion 4- 3+  Hip extension    Hip abduction (seated) 5 5  Hip adduction (seated) 5 5  Hip internal rotation    Hip external rotation    Knee flexion 5 4  Knee extension 5 4  Ankle dorsiflexion 5 4-  Ankle plantarflexion    Ankle inversion    Ankle eversion    (* = pain; Blank rows = not tested)   Coordination/Cerebellar Finger to Nose: L-sided hypometria  Heel to Shin: L-sided impairment  Rapid alternating  movements: Impaired L>R, mild impairment LE toe tapping  Finger Opposition: WNL Pronator Drift: Positive LUE   FUNCTIONAL OUTCOME MEASURES (initial eval)   Results Comments  BERG 50/56 Fall risk, in need of intervention  DGI Next visit/24   TUG 13.6 seconds   5TSTS 35.2 seconds   (Blank rows = not tested)   Postural Control Screen Romberg: EO WNL, EC up to 30 sec with significant sway and near-LOB x 2   Lumbar Spine Screen (04/24/23) SLUMP: Negative SLR: Negative Repeated extension in standing: increases, worse pain locally along low back    TODAY'S TREATMENT    SUBJECTIVE STATEMENT:   Patient reports some back pain at arrival - he states his pain is "nothing out of the norm." Patient reports practicing walking/stepping exercises while going about his day. Pt reports no concerning LOB or near-fall incidents recently.    Therapeutic Exercise - improved strength as needed to improve performance of CKC activities/functional movements and as needed for power production to prevent fall  during episode of large postural perturbation   NuStep; Level 4, x 6 minutes - for improved soft tissue mobility and increased tissue temperature to improve muscle performance   -subjective gathered during this time  -Moist heat along low back for analgesic effect  // bars: high knees with opposite knee touch; 5x D/B  PATIENT EDUCATION: HEP update for large stepping movements with bilat shoulder horizontal abduction.     Neuromuscular Re-education - fall risk screening/management; for improved sensory integration, static and dynamic postural control, equilibrium and non-equilibrium coordination as needed for negotiating home and community environment and stepping over obstacles     Forward long step with shoulder horizontal abduction, loud verbal count; 2 x 10 with alternating legs  Lateral long step with shoulder horizontal abduction, loud verbal count; 2 x 10 with alternating legs   Sit to  stand with shoulder horizontal abduction, exagerrated posture with HABD at top of transfer held x 10 sec  -performed 2 x 6   Toe tapping on single cone; 2 x 10 alternating R/L     Alternating cone tap; 4 cones on R and L; 5x D/B   Hurdle stepping; (2) 12-inch hurdles and (3) 6-inch hurdles; step-to pattern; x2 D/B  -difficulty clearing L foot over 12-inch hurdle when LLE is trailing    Hurdle stepping; (3) 6-inch hurdles only; reciprocal step-over; x4 D/B    *next visit*  Tandem stance;     *not today*  Forward march along blue agility ladder, exaggerated arm and leg swing; 5x D/B  Toe tapping on 6-inch step; 2x10    PATIENT EDUCATION:  Education details: see above for patient education details Person educated: Patient Education method: Explanation Education comprehension: verbalized understanding   HOME EXERCISE PROGRAM: Access Code: [email or web address] URL: [email or web address] Date: 05/06/2023 Prepared by: Consuela Mimes  Exercises - Step Forward with Arms Reaching to Sides  - 2 x daily - 7 x weekly - 2 sets - 10 reps - Step Sideways with Arms Reaching  - 2 x daily - 7 x weekly - 2 sets - 10 reps - Sit to Stand with Arm Reach  - 2 x daily - 7 x weekly - 2 sets - 8 reps - Heel Toe Raises with Counter Support  - 2 x daily - 7 x weekly - 2 sets - 10 reps   Access Code: [email or web address] URL: [email or web address] Date: 04/24/2023 Prepared by: Consuela Mimes  Exercises - Supine Lower Trunk Rotation  - 2 x daily - 7 x weekly - 2 sets - 10 reps - Supine Piriformis Stretch with Foot on Ground  - 2 x daily - 7 x weekly - 3 sets - 30sec hold - Seated Hamstring Stretch  - 2 x daily - 7 x weekly - 3 sets - 30sec hold   ASSESSMENT:  CLINICAL IMPRESSION: HEP was updated today to include lateral large steps with bilat shoulder horizontal abduction and sit to stand with large stance at top of transfer with bilat shoulder horizontal abduction (see program  above). Pt demonstrates good coordination of lower limbs with single cone tapping; we progressed today to lateral alternating cone tap on R and L side with forward stepping. Pt participates very well with PT and fortunately is doing well with safe community-level gait at this time. Patient will benefit from skilled PT to address above impairments and improve overall function.  REHAB POTENTIAL: Good  CLINICAL DECISION MAKING: Evolving/moderate complexity  EVALUATION COMPLEXITY: High   GOALS: Goals reviewed with patient? Yes  SHORT  TERM GOALS: Target date: 05/16/2023  Pt will be independent with HEP in order to improve strength and balance in order to decrease fall risk and improve function at home. Baseline: 04/22/23: Formal HEP to be issued on visit # 2.   04/24/23: HEP given for thoracolumbar mobility, reviewed baseline exercises for LE strengthening/fall risk. Goal status: INITIAL   LONG TERM GOALS: Target date: 06/20/2023  Patient will improve MMT of tested LLE musculature to 4+/5 or greater indicative of improved strength as needed for power production to prevent fall and to improve performance of transfers and stair negotiation.  Baseline: 04/22/23: LLE gross strength 3+ to 4 (exception of seated hip ABD/ADD 5/5, not tested in standard testing position).  Goal status: INITIAL  2.  Pt will improve BERG by at least 3 points in order to demonstrate clinically significant improvement in balance.   Baseline: 04/22/23: To be completed on visit # 2.    04/24/23: 50/56    Goal status: INITIAL  3. Pt will decrease 5TSTS by at least 3 seconds in order to demonstrate clinically significant improvement in LE strength      Baseline: 04/22/23: 35.2 sec  Goal status: INITIAL  4. Pt will improve DGI by at least 3 points in order to demonstrate clinically significant improvement in balance and decreased risk for falls.     Baseline: 04/22/23: To be completed on visit # 3.  04/29/23: 15/24 Goal status:  INITIAL  5. Pt will complete TUG in less than 11.5 sec indicative of decreased fall risk per clinical population Baseline: 04/22/23: 13.6 sec  Goal status: INITIAL   PLAN: PT FREQUENCY: 2x/week  PT DURATION: 8-10 weeks  PLANNED INTERVENTIONS: Therapeutic exercises, Therapeutic activity, Neuromuscular re-education, Balance training, Gait training, Patient/Family education, Joint manipulation, Joint mobilization, Canalith repositioning, Aquatic Therapy, Dry Needling, Cognitive remediation, Electrical stimulation, Spinal manipulation, Spinal mobilization, Cryotherapy, Moist heat, Traction, Ultrasound, Ionotophoresis 4mg /ml Dexamethasone, and Manual therapy  PLAN FOR NEXT SESSION: Continue with postural control training, LE coordination training for toe clearance and stable weight shift, LE strengthening. Large-amplitude movements for HEP.    Consuela Mimes, PT, DPT #[id number]  Gertie Exon 05/06/2023, 5:21 PM

## 2023-05-07 ENCOUNTER — Encounter: Payer: Self-pay | Admitting: Physical Therapy

## 2023-05-08 ENCOUNTER — Encounter: Payer: 59 | Admitting: Physical Therapy

## 2023-05-13 ENCOUNTER — Ambulatory Visit: Payer: 59 | Admitting: Physical Therapy

## 2023-05-13 DIAGNOSIS — R2681 Unsteadiness on feet: Secondary | ICD-10-CM

## 2023-05-13 DIAGNOSIS — R262 Difficulty in walking, not elsewhere classified: Secondary | ICD-10-CM

## 2023-05-13 DIAGNOSIS — R2689 Other abnormalities of gait and mobility: Secondary | ICD-10-CM

## 2023-05-13 NOTE — Therapy (Signed)
 OUTPATIENT PHYSICAL THERAPY TREATMENT   Patient Name: Tyler Glover MRN: [id number] DOB:1960/06/16, 63 y.o., male Today's Date: 05/13/2023   PT End of Session - 05/13/23 1720     Visit Number 6    Number of Visits 17    Date for PT Re-Evaluation 06/20/23    PT Start Time 1711    PT Stop Time 1755    PT Time Calculation (min) 44 min    Equipment Utilized During Treatment Gait belt    Activity Tolerance Patient tolerated treatment well    Behavior During Therapy WFL for tasks assessed/performed              Past Medical History:  Diagnosis Date   Allergy    Arthritis    Rheumatoid   GERD (gastroesophageal reflux disease)    Hypertension    Past Surgical History:  Procedure Laterality Date   COLONOSCOPY     COLONOSCOPY WITH PROPOFOL N/A 08/21/2019   Procedure: COLONOSCOPY WITH PROPOFOL;  Surgeon: Midge Minium, MD;  Location: [facility];  Service: Endoscopy;  Laterality: N/A;  priority 4   ESOPHAGOGASTRODUODENOSCOPY (EGD) WITH PROPOFOL N/A 08/21/2019   Procedure: ESOPHAGOGASTRODUODENOSCOPY (EGD) WITH PROPOFOL;  Surgeon: Midge Minium, MD;  Location: [facility];  Service: Endoscopy;  Laterality: N/A;   POLYPECTOMY  08/21/2019   Procedure: POLYPECTOMY;  Surgeon: Midge Minium, MD;  Location: [facility];  Service: Endoscopy;;   SHOULDER SURGERY     TESTICLE SURGERY     age 38 or 50   TONSILLECTOMY     Patient Active Problem List   Diagnosis Date Noted   Encounter for screening colonoscopy    Polyp of transverse colon    Heartburn    Pure hypercholesterolemia 01/26/2016   Right lateral epicondylitis 07/07/2015   Vitamin D deficiency 12/24/2014   Prediabetes 12/24/2014   Allergic rhinitis, seasonal 06/03/2014   Acid reflux 06/03/2014   Essential (primary) hypertension 06/03/2014   Family history of diabetes mellitus 06/03/2014   Family history of cardiac disorder 06/03/2014   Obesity (BMI 30.0-34.9) 06/03/2014    PCP: Duanne Limerick,  MD  REFERRING PROVIDER: Arminda Resides, MD  REFERRING DIAGNOSIS:  G20.A1 (ICD-10-CM) - Parkinson's disease (HCC)    THERAPY DIAG: Difficulty in walking, not elsewhere classified  Unsteadiness on feet  Other abnormalities of gait and mobility  ONSET DATE: 02/2023  FOLLOW UP APPT WITH PROVIDER: Yes, f/u with referring provider in July 2025   RATIONALE FOR EVALUATION AND TREATMENT: Rehabilitation  Pertinent History Patient is a 63 year old male referred for gait/mobility deficits secondary to Parkinson's disease. Using amantadine for PD symptoms. Patient reports L side of his body is getting weaker. He reports he wants to be able to increase his activity. Pt reports comorbid low back pain and hip pain. Patient reports responding relatively well with monthly infusions of RA. Pt reports his LLE tends to drag - concerned for it catching on carpet.   Pain: Yes; multi-site associated with RA; low back and comorbid L knee pain.  Numbness/Tingling: No Focal Weakness: Yes; L-sided weakness, LE worse than UE Recent changes in overall health/medication: No Prior history of physical therapy for balance:  Yes; previous PT in [facility] outpatient clinic  Falls: Has patient fallen in last 6 months? No, Number of falls: N/A Directional pattern for falls: No Dominant hand: right Imaging: Yes ;  Normal brain MRI in 2023;   Prior level of function: Independent Occupational demands: IT trainer - M-F; p thas to hold batteries, alternators,  starters Hobbies: Traveling  Red flags (bowel/bladder changes, saddle paresthesia, personal history of cancer, h/o spinal tumors, h/o compression fx, h/o abdominal aneurysm, abdominal pain, chills/fever, night sweats, nausea, vomiting, unrelenting pain): Negative  Precautions: None  Weight Bearing Restrictions: No  Living Environment Lives with: lives with their spouse and mother  Lives in: House/apartment; one step to get into home; stairs to get  into home, pt has upstairs bedroom. Pt reports doing fairly well with stairs. Concrete from his car up to front steps  Has following equipment at home: None   Patient Goals: Improved mobility in L leg and L arm, improved strength in low back.     OBJECTIVE:   Patient Surveys  ABC: 91.3%  Cognition Patient is oriented to person, place, and time.  Recent memory is intact.  Remote memory is intact.  Attention span and concentration are intact.  Expressive speech is intact.  Patient's fund of knowledge is within normal limits for educational level.   Gross Musculoskeletal Assessment Tremor: No resting tremor  Bulk: Normal Tone: Cogwheel rigidity LE > UE. Hamstrings are spastic on L>R, Modified Ashworth 2 and 1, respectively.  GAIT: Distance walked: 80 ft Assistive device utilized: None Level of assistance: SBA Comments: Decreased arm swing, decreased trunk rotation with arm/leg swing, LLE decreased heel strike/mid-foot initial contact  Posture: Forward head, rounded shoulders   AROM  AROM (Normal range in degrees) AROM  04/22/2023  Lumbar   Flexion (65) 75%*  Extension (30) 50% no pain  Right lateral flexion (25) 75% (R-sided discomfort)  Left lateral flexion (25) 75% (R-sided discomfort)  Right rotation (30) 50%  Left rotation (30) 25%          Ankle    Dorsiflexion (20) WFL WFL  Plantarflexion (50)    Inversion (35)    Eversion (15    (* = pain; Blank rows = not tested)   LE MMT:  MMT (out of 5) Right 04/22/2023 Left 04/22/2023  Hip flexion 4- 3+  Hip extension    Hip abduction (seated) 5 5  Hip adduction (seated) 5 5  Hip internal rotation    Hip external rotation    Knee flexion 5 4  Knee extension 5 4  Ankle dorsiflexion 5 4-  Ankle plantarflexion    Ankle inversion    Ankle eversion    (* = pain; Blank rows = not tested)   Coordination/Cerebellar Finger to Nose: L-sided hypometria  Heel to Shin: L-sided impairment  Rapid alternating  movements: Impaired L>R, mild impairment LE toe tapping  Finger Opposition: WNL Pronator Drift: Positive LUE   FUNCTIONAL OUTCOME MEASURES (initial eval)   Results Comments  BERG 50/56 Fall risk, in need of intervention  DGI Next visit/24   TUG 13.6 seconds   5TSTS 35.2 seconds   (Blank rows = not tested)   Postural Control Screen Romberg: EO WNL, EC up to 30 sec with significant sway and near-LOB x 2   Lumbar Spine Screen (04/24/23) SLUMP: Negative SLR: Negative Repeated extension in standing: increases, worse pain locally along low back    TODAY'S TREATMENT    SUBJECTIVE STATEMENT:   Patient reports some back pain at arrival - he states his pain is "nothing out of the norm." Patient reports practicing walking/stepping exercises while going about his day. Pt reports no concerning LOB or near-fall incidents recently.    Therapeutic Exercise - improved strength as needed to improve performance of CKC activities/functional movements and as needed for power production to prevent  fall during episode of large postural perturbation   NuStep; Level 4, x 6 minutes - for improved soft tissue mobility and increased tissue temperature to improve muscle performance   -subjective gathered during this time  -Moist heat along low back for analgesic effect  // bars: high knees with opposite knee touch; 5x D/B  PATIENT EDUCATION: HEP update for large stepping movements with bilat shoulder horizontal abduction.     Neuromuscular Re-education - fall risk screening/management; for improved sensory integration, static and dynamic postural control, equilibrium and non-equilibrium coordination as needed for negotiating home and community environment and stepping over obstacles     Reverse long step with shoulder horizontal abduction, loud verbal count; 2 x 10 with alternating legs  Lateral long step with shoulder horizontal abduction and cross-body reach, loud verbal count; 2 x 10 with  alternating legs   Sit to stand with unweighted ball overhead reach, standing on Airex; 1 x 8, 1 x 10    Tandem stance; 2  x 20-30 sec static holds   Obstacle course: alternating cone tap (4 cones on R and L); (3) 6-inch hurdles; Airex step up/down; 5x D/B        *not today*  Toe tapping on single cone; 2 x 10 alternating R/L  Forward march along blue agility ladder, exaggerated arm and leg swing; 5x D/B  Toe tapping on 6-inch step; 2x10    PATIENT EDUCATION:  Education details: see above for patient education details Person educated: Patient Education method: Explanation Education comprehension: verbalized understanding   HOME EXERCISE PROGRAM: Access Code: [email or web address] URL: [email or web address] Date: 05/06/2023 Prepared by: Consuela Mimes  Exercises - Step Forward with Arms Reaching to Sides  - 2 x daily - 7 x weekly - 2 sets - 10 reps - Step Sideways with Arms Reaching  - 2 x daily - 7 x weekly - 2 sets - 10 reps - Sit to Stand with Arm Reach  - 2 x daily - 7 x weekly - 2 sets - 8 reps - Heel Toe Raises with Counter Support  - 2 x daily - 7 x weekly - 2 sets - 10 reps   Access Code: [email or web address] URL: [email or web address] Date: 04/24/2023 Prepared by: Consuela Mimes  Exercises - Supine Lower Trunk Rotation  - 2 x daily - 7 x weekly - 2 sets - 10 reps - Supine Piriformis Stretch with Foot on Ground  - 2 x daily - 7 x weekly - 3 sets - 30sec hold - Seated Hamstring Stretch  - 2 x daily - 7 x weekly - 3 sets - 30sec hold   ASSESSMENT:  CLINICAL IMPRESSION: HEP was updated today to include lateral large steps with bilat shoulder horizontal abduction and sit to stand with large stance at top of transfer with bilat shoulder horizontal abduction (see program above). Pt demonstrates good coordination of lower limbs with single cone tapping; we progressed today to lateral alternating cone tap on R and L side with forward stepping. Pt participates  very well with PT and fortunately is doing well with safe community-level gait at this time. Patient will benefit from skilled PT to address above impairments and improve overall function.  REHAB POTENTIAL: Good  CLINICAL DECISION MAKING: Evolving/moderate complexity  EVALUATION COMPLEXITY: High   GOALS: Goals reviewed with patient? Yes  SHORT TERM GOALS: Target date: 05/16/2023  Pt will be independent with HEP in order to improve strength and balance in order to decrease fall risk and improve function at home. Baseline:  04/22/23: Formal HEP to be issued on visit # 2.   04/24/23: HEP given for thoracolumbar mobility, reviewed baseline exercises for LE strengthening/fall risk. Goal status: INITIAL   LONG TERM GOALS: Target date: 06/20/2023  Patient will improve MMT of tested LLE musculature to 4+/5 or greater indicative of improved strength as needed for power production to prevent fall and to improve performance of transfers and stair negotiation.  Baseline: 04/22/23: LLE gross strength 3+ to 4 (exception of seated hip ABD/ADD 5/5, not tested in standard testing position).  Goal status: INITIAL  2.  Pt will improve BERG by at least 3 points in order to demonstrate clinically significant improvement in balance.   Baseline: 04/22/23: To be completed on visit # 2.    04/24/23: 50/56    Goal status: INITIAL  3. Pt will decrease 5TSTS by at least 3 seconds in order to demonstrate clinically significant improvement in LE strength      Baseline: 04/22/23: 35.2 sec  Goal status: INITIAL  4. Pt will improve DGI by at least 3 points in order to demonstrate clinically significant improvement in balance and decreased risk for falls.     Baseline: 04/22/23: To be completed on visit # 3.  04/29/23: 15/24 Goal status: INITIAL  5. Pt will complete TUG in less than 11.5 sec indicative of decreased fall risk per clinical population Baseline: 04/22/23: 13.6 sec  Goal status: INITIAL   PLAN: PT FREQUENCY:  2x/week  PT DURATION: 8-10 weeks  PLANNED INTERVENTIONS: Therapeutic exercises, Therapeutic activity, Neuromuscular re-education, Balance training, Gait training, Patient/Family education, Joint manipulation, Joint mobilization, Canalith repositioning, Aquatic Therapy, Dry Needling, Cognitive remediation, Electrical stimulation, Spinal manipulation, Spinal mobilization, Cryotherapy, Moist heat, Traction, Ultrasound, Ionotophoresis 4mg /ml Dexamethasone, and Manual therapy  PLAN FOR NEXT SESSION: Continue with postural control training, LE coordination training for toe clearance and stable weight shift, LE strengthening. Large-amplitude movements for HEP.   THIS NOTE IS INCOMPLETE, PLEASE DO NOT REFERENCE FOR INFORMATION   Consuela Mimes, PT, DPT [phone]  Gertie Exon 05/13/2023, 6:02 PM

## 2023-05-14 ENCOUNTER — Encounter: Payer: Self-pay | Admitting: Physical Therapy

## 2023-05-15 ENCOUNTER — Ambulatory Visit: Payer: 59 | Attending: Physical Medicine & Rehabilitation | Admitting: Physical Therapy

## 2023-05-15 DIAGNOSIS — R262 Difficulty in walking, not elsewhere classified: Secondary | ICD-10-CM | POA: Diagnosis present

## 2023-05-15 DIAGNOSIS — R2681 Unsteadiness on feet: Secondary | ICD-10-CM | POA: Diagnosis present

## 2023-05-15 DIAGNOSIS — R2689 Other abnormalities of gait and mobility: Secondary | ICD-10-CM | POA: Insufficient documentation

## 2023-05-15 NOTE — Therapy (Addendum)
 OUTPATIENT PHYSICAL THERAPY TREATMENT   Patient Name: Tyler Glover MRN: [id number] DOB:December 16, 1960, 63 y.o., male Today's Date: 05/15/2023   PT End of Session - 05/15/23 1715     Visit Number 7    Number of Visits 17    Date for PT Re-Evaluation 06/20/23    PT Start Time 1716    PT Stop Time 1756    PT Time Calculation (min) 40 min    Equipment Utilized During Treatment Gait belt    Activity Tolerance Patient tolerated treatment well    Behavior During Therapy WFL for tasks assessed/performed               Past Medical History:  Diagnosis Date   Allergy    Arthritis    Rheumatoid   GERD (gastroesophageal reflux disease)    Hypertension    Past Surgical History:  Procedure Laterality Date   COLONOSCOPY     COLONOSCOPY WITH PROPOFOL N/A 08/21/2019   Procedure: COLONOSCOPY WITH PROPOFOL;  Surgeon: Midge Minium, MD;  Location: [facility];  Service: Endoscopy;  Laterality: N/A;  priority 4   ESOPHAGOGASTRODUODENOSCOPY (EGD) WITH PROPOFOL N/A 08/21/2019   Procedure: ESOPHAGOGASTRODUODENOSCOPY (EGD) WITH PROPOFOL;  Surgeon: Midge Minium, MD;  Location: [facility];  Service: Endoscopy;  Laterality: N/A;   POLYPECTOMY  08/21/2019   Procedure: POLYPECTOMY;  Surgeon: Midge Minium, MD;  Location: [facility];  Service: Endoscopy;;   SHOULDER SURGERY     TESTICLE SURGERY     age 46 or 72   TONSILLECTOMY     Patient Active Problem List   Diagnosis Date Noted   Encounter for screening colonoscopy    Polyp of transverse colon    Heartburn    Pure hypercholesterolemia 01/26/2016   Right lateral epicondylitis 07/07/2015   Vitamin D deficiency 12/24/2014   Prediabetes 12/24/2014   Allergic rhinitis, seasonal 06/03/2014   Acid reflux 06/03/2014   Essential (primary) hypertension 06/03/2014   Family history of diabetes mellitus 06/03/2014   Family history of cardiac disorder 06/03/2014   Obesity (BMI 30.0-34.9) 06/03/2014    PCP: Duanne Limerick,  MD  REFERRING PROVIDER: Arminda Resides, MD  REFERRING DIAGNOSIS:  G20.A1 (ICD-10-CM) - Parkinson's disease (HCC)    THERAPY DIAG: Difficulty in walking, not elsewhere classified  Unsteadiness on feet  Other abnormalities of gait and mobility  ONSET DATE: 02/2023  FOLLOW UP APPT WITH PROVIDER: Yes, f/u with referring provider in July 2025   RATIONALE FOR EVALUATION AND TREATMENT: Rehabilitation  Pertinent History Patient is a 63 year old male referred for gait/mobility deficits secondary to Parkinson's disease. Using amantadine for PD symptoms. Patient reports L side of his body is getting weaker. He reports he wants to be able to increase his activity. Pt reports comorbid low back pain and hip pain. Patient reports responding relatively well with monthly infusions of RA. Pt reports his LLE tends to drag - concerned for it catching on carpet.   Pain: Yes; multi-site associated with RA; low back and comorbid L knee pain.  Numbness/Tingling: No Focal Weakness: Yes; L-sided weakness, LE worse than UE Recent changes in overall health/medication: No Prior history of physical therapy for balance:  Yes; previous PT in [facility] outpatient clinic  Falls: Has patient fallen in last 6 months? No, Number of falls: N/A Directional pattern for falls: No Dominant hand: right Imaging: Yes ;  Normal brain MRI in 2023;   Prior level of function: Independent Occupational demands: IT trainer - M-F; p thas to hold batteries,  alternators, starters Hobbies: Traveling  Red flags (bowel/bladder changes, saddle paresthesia, personal history of cancer, h/o spinal tumors, h/o compression fx, h/o abdominal aneurysm, abdominal pain, chills/fever, night sweats, nausea, vomiting, unrelenting pain): Negative  Precautions: None  Weight Bearing Restrictions: No  Living Environment Lives with: lives with their spouse and mother  Lives in: House/apartment; one step to get into home; stairs to get  into home, pt has upstairs bedroom. Pt reports doing fairly well with stairs. Concrete from his car up to front steps  Has following equipment at home: None   Patient Goals: Improved mobility in L leg and L arm, improved strength in low back.     OBJECTIVE:   Patient Surveys  ABC: 91.3%  Cognition Patient is oriented to person, place, and time.  Recent memory is intact.  Remote memory is intact.  Attention span and concentration are intact.  Expressive speech is intact.  Patient's fund of knowledge is within normal limits for educational level.   Gross Musculoskeletal Assessment Tremor: No resting tremor  Bulk: Normal Tone: Cogwheel rigidity LE > UE. Hamstrings are spastic on L>R, Modified Ashworth 2 and 1, respectively.  GAIT: Distance walked: 80 ft Assistive device utilized: None Level of assistance: SBA Comments: Decreased arm swing, decreased trunk rotation with arm/leg swing, LLE decreased heel strike/mid-foot initial contact  Posture: Forward head, rounded shoulders   AROM  AROM (Normal range in degrees) AROM  04/22/2023  Lumbar   Flexion (65) 75%*  Extension (30) 50% no pain  Right lateral flexion (25) 75% (R-sided discomfort)  Left lateral flexion (25) 75% (R-sided discomfort)  Right rotation (30) 50%  Left rotation (30) 25%          Ankle    Dorsiflexion (20) WFL WFL  Plantarflexion (50)    Inversion (35)    Eversion (15    (* = pain; Blank rows = not tested)   LE MMT:  MMT (out of 5) Right 04/22/2023 Left 04/22/2023  Hip flexion 4- 3+  Hip extension    Hip abduction (seated) 5 5  Hip adduction (seated) 5 5  Hip internal rotation    Hip external rotation    Knee flexion 5 4  Knee extension 5 4  Ankle dorsiflexion 5 4-  Ankle plantarflexion    Ankle inversion    Ankle eversion    (* = pain; Blank rows = not tested)   Coordination/Cerebellar Finger to Nose: L-sided hypometria  Heel to Shin: L-sided impairment  Rapid alternating  movements: Impaired L>R, mild impairment LE toe tapping  Finger Opposition: WNL Pronator Drift: Positive LUE   FUNCTIONAL OUTCOME MEASURES (initial eval)   Results Comments  BERG 50/56 Fall risk, in need of intervention  DGI Next visit/24   TUG 13.6 seconds   5TSTS 35.2 seconds   (Blank rows = not tested)   Postural Control Screen Romberg: EO WNL, EC up to 30 sec with significant sway and near-LOB x 2   Lumbar Spine Screen (04/24/23) SLUMP: Negative SLR: Negative Repeated extension in standing: increases, worse pain locally along low back    TODAY'S TREATMENT    SUBJECTIVE STATEMENT:   Patient reports busy week at work. He reports 6-7/10 back pain at arrival. Patient reports feeling better after completion of PT sessions.    Therapeutic Exercise - improved strength as needed to improve performance of CKC activities/functional movements and as needed for power production to prevent fall during episode of large postural perturbation   NuStep; Level 4, x 6  minutes - for improved soft tissue mobility and increased tissue temperature to improve muscle performance   -subjective gathered during this time  -Moist heat along low back for analgesic effect  // bars: high knees with opposite knee touch; 5x D/B  PATIENT EDUCATION: Discussed modification for his seat setup at work with use of lumbar roll prn.      Neuromuscular Re-education - fall risk screening/management; for improved sensory integration, static and dynamic postural control, equilibrium and non-equilibrium coordination as needed for negotiating home and community environment and stepping over obstacles     Reverse long step with shoulder horizontal abduction, loud verbal count; 2 x 10 with alternating legs  Lateral long step with shoulder horizontal abduction and cross-body reach, loud verbal count; 2 x 10 with alternating legs   Sit to stand with unweighted ball overhead reach, standing on Airex; 2x10   Tandem  stance; 2  x 20-30 sec static holds   Obstacle course: alternating cone tap (4 cones on R and L); (3) 6-inch hurdles; Airex step up/down; 5x D/B        *not today*  Toe tapping on single cone; 2 x 10 alternating R/L  Forward march along blue agility ladder, exaggerated arm and leg swing; 5x D/B  Toe tapping on 6-inch step; 2x10    PATIENT EDUCATION:  Education details: see above for patient education details Person educated: Patient Education method: Explanation Education comprehension: verbalized understanding   HOME EXERCISE PROGRAM: Access Code: [email or web address] URL: [email or web address] Date: 05/06/2023 Prepared by: Consuela Mimes  Exercises - Step Forward with Arms Reaching to Sides  - 2 x daily - 7 x weekly - 2 sets - 10 reps - Step Sideways with Arms Reaching  - 2 x daily - 7 x weekly - 2 sets - 10 reps - Sit to Stand with Arm Reach  - 2 x daily - 7 x weekly - 2 sets - 8 reps - Heel Toe Raises with Counter Support  - 2 x daily - 7 x weekly - 2 sets - 10 reps   Access Code: [email or web address] URL: [email or web address] Date: 04/24/2023 Prepared by: Consuela Mimes  Exercises - Supine Lower Trunk Rotation  - 2 x daily - 7 x weekly - 2 sets - 10 reps - Supine Piriformis Stretch with Foot on Ground  - 2 x daily - 7 x weekly - 3 sets - 30sec hold - Seated Hamstring Stretch  - 2 x daily - 7 x weekly - 3 sets - 30sec hold   ASSESSMENT:  CLINICAL IMPRESSION: Patient is adept with obstacle negotiation and has markedly improved capacity for multi-directional stepping with improved LLE toe clearance. Pt has ongoing truncal rigidity with his natural gait pattern, but no marked freezing or festinating while on amantadine. We will continue with progressing with large-amplitude movements with plan for transition to robust HEP once PT goals and patient goals are met. Patient will benefit from skilled PT to address above impairments and improve overall  function.  REHAB POTENTIAL: Good  CLINICAL DECISION MAKING: Evolving/moderate complexity  EVALUATION COMPLEXITY: High   GOALS: Goals reviewed with patient? Yes  SHORT TERM GOALS: Target date: 05/16/2023  Pt will be independent with HEP in order to improve strength and balance in order to decrease fall risk and improve function at home. Baseline: 04/22/23: Formal HEP to be issued on visit # 2.   04/24/23: HEP given for thoracolumbar mobility, reviewed baseline exercises for LE strengthening/fall risk. Goal status: INITIAL   LONG TERM GOALS:  Target date: 06/20/2023  Patient will improve MMT of tested LLE musculature to 4+/5 or greater indicative of improved strength as needed for power production to prevent fall and to improve performance of transfers and stair negotiation.  Baseline: 04/22/23: LLE gross strength 3+ to 4 (exception of seated hip ABD/ADD 5/5, not tested in standard testing position).  Goal status: INITIAL  2.  Pt will improve BERG by at least 3 points in order to demonstrate clinically significant improvement in balance.   Baseline: 04/22/23: To be completed on visit # 2.    04/24/23: 50/56    Goal status: INITIAL  3. Pt will decrease 5TSTS by at least 3 seconds in order to demonstrate clinically significant improvement in LE strength      Baseline: 04/22/23: 35.2 sec  Goal status: INITIAL  4. Pt will improve DGI by at least 3 points in order to demonstrate clinically significant improvement in balance and decreased risk for falls.     Baseline: 04/22/23: To be completed on visit # 3.  04/29/23: 15/24 Goal status: INITIAL  5. Pt will complete TUG in less than 11.5 sec indicative of decreased fall risk per clinical population Baseline: 04/22/23: 13.6 sec  Goal status: INITIAL   PLAN: PT FREQUENCY: 2x/week  PT DURATION: 8-10 weeks  PLANNED INTERVENTIONS: Therapeutic exercises, Therapeutic activity, Neuromuscular re-education, Balance training, Gait training,  Patient/Family education, Joint manipulation, Joint mobilization, Canalith repositioning, Aquatic Therapy, Dry Needling, Cognitive remediation, Electrical stimulation, Spinal manipulation, Spinal mobilization, Cryotherapy, Moist heat, Traction, Ultrasound, Ionotophoresis 4mg /ml Dexamethasone, and Manual therapy  PLAN FOR NEXT SESSION: Continue with postural control training, LE coordination training for toe clearance and stable weight shift, LE strengthening. Large-amplitude movements for HEP.     Consuela Mimes, PT, DPT #[id number]  Gertie Exon 05/15/2023, 5:16 PM

## 2023-05-17 ENCOUNTER — Encounter: Payer: Self-pay | Admitting: Physical Therapy

## 2023-05-20 ENCOUNTER — Ambulatory Visit: Payer: 59 | Admitting: Physical Therapy

## 2023-05-22 ENCOUNTER — Encounter: Payer: 59 | Admitting: Physical Therapy

## 2023-05-27 ENCOUNTER — Encounter: Payer: Self-pay | Admitting: Physical Therapy

## 2023-05-27 ENCOUNTER — Ambulatory Visit: Payer: 59 | Admitting: Physical Therapy

## 2023-05-27 DIAGNOSIS — R2681 Unsteadiness on feet: Secondary | ICD-10-CM

## 2023-05-27 DIAGNOSIS — R262 Difficulty in walking, not elsewhere classified: Secondary | ICD-10-CM | POA: Diagnosis not present

## 2023-05-27 DIAGNOSIS — R2689 Other abnormalities of gait and mobility: Secondary | ICD-10-CM

## 2023-05-27 NOTE — Therapy (Unsigned)
 OUTPATIENT PHYSICAL THERAPY TREATMENT   Patient Name: Tyler Glover MRN: [id number] DOB:[birth year], 63 y.o., male Today's Date: 05/27/2023   PT End of Session - 05/27/23 1651     Visit Number 8    Number of Visits 17    Date for PT Re-Evaluation 06/20/23    PT Start Time 1654    PT Stop Time 1743    PT Time Calculation (min) 49 min    Equipment Utilized During Treatment Gait belt    Activity Tolerance Patient tolerated treatment well    Behavior During Therapy WFL for tasks assessed/performed                Past Medical History:  Diagnosis Date   Allergy    Arthritis    Rheumatoid   GERD (gastroesophageal reflux disease)    Hypertension    Past Surgical History:  Procedure Laterality Date   COLONOSCOPY     COLONOSCOPY WITH PROPOFOL N/A 08/21/2019   Procedure: COLONOSCOPY WITH PROPOFOL;  Surgeon: Midge Minium, MD;  Location: [facility];  Service: Endoscopy;  Laterality: N/A;  priority 4   ESOPHAGOGASTRODUODENOSCOPY (EGD) WITH PROPOFOL N/A 08/21/2019   Procedure: ESOPHAGOGASTRODUODENOSCOPY (EGD) WITH PROPOFOL;  Surgeon: Midge Minium, MD;  Location: [facility];  Service: Endoscopy;  Laterality: N/A;   POLYPECTOMY  08/21/2019   Procedure: POLYPECTOMY;  Surgeon: Midge Minium, MD;  Location: [facility];  Service: Endoscopy;;   SHOULDER SURGERY     TESTICLE SURGERY     age 6 or 79   TONSILLECTOMY     Patient Active Problem List   Diagnosis Date Noted   Encounter for screening colonoscopy    Polyp of transverse colon    Heartburn    Pure hypercholesterolemia 01/26/2016   Right lateral epicondylitis 07/07/2015   Vitamin D deficiency 12/24/2014   Prediabetes 12/24/2014   Allergic rhinitis, seasonal 06/03/2014   Acid reflux 06/03/2014   Essential (primary) hypertension 06/03/2014   Family history of diabetes mellitus 06/03/2014   Family history of cardiac disorder 06/03/2014   Obesity (BMI 30.0-34.9) 06/03/2014    PCP: Duanne Limerick,  MD  REFERRING PROVIDER: Arminda Resides, MD  REFERRING DIAGNOSIS:  G20.A1 (ICD-10-CM) - Parkinson's disease (HCC)    THERAPY DIAG: Difficulty in walking, not elsewhere classified  Unsteadiness on feet  Other abnormalities of gait and mobility  ONSET DATE: 02/2023  FOLLOW UP APPT WITH PROVIDER: Yes, f/u with referring provider in July 2025   RATIONALE FOR EVALUATION AND TREATMENT: Rehabilitation  Pertinent History Patient is a 63 year old male referred for gait/mobility deficits secondary to Parkinson's disease. Using amantadine for PD symptoms. Patient reports L side of his body is getting weaker. He reports he wants to be able to increase his activity. Pt reports comorbid low back pain and hip pain. Patient reports responding relatively well with monthly infusions of RA. Pt reports his LLE tends to drag - concerned for it catching on carpet.   Pain: Yes; multi-site associated with RA; low back and comorbid L knee pain.  Numbness/Tingling: No Focal Weakness: Yes; L-sided weakness, LE worse than UE Recent changes in overall health/medication: No Prior history of physical therapy for balance:  Yes; previous PT in [facility] outpatient clinic  Falls: Has patient fallen in last 6 months? No, Number of falls: N/A Directional pattern for falls: No Dominant hand: right Imaging: Yes ;  Normal brain MRI in 2023;   Prior level of function: Independent Occupational demands: IT trainer - M-F; p thas to hold  batteries, alternators, starters Hobbies: Traveling  Red flags (bowel/bladder changes, saddle paresthesia, personal history of cancer, h/o spinal tumors, h/o compression fx, h/o abdominal aneurysm, abdominal pain, chills/fever, night sweats, nausea, vomiting, unrelenting pain): Negative  Precautions: None  Weight Bearing Restrictions: No  Living Environment Lives with: lives with their spouse and mother  Lives in: House/apartment; one step to get into home; stairs to get  into home, pt has upstairs bedroom. Pt reports doing fairly well with stairs. Concrete from his car up to front steps  Has following equipment at home: None   Patient Goals: Improved mobility in L leg and L arm, improved strength in low back.     OBJECTIVE:   Patient Surveys  ABC: 91.3%  Cognition Patient is oriented to person, place, and time.  Recent memory is intact.  Remote memory is intact.  Attention span and concentration are intact.  Expressive speech is intact.  Patient's fund of knowledge is within normal limits for educational level.   Gross Musculoskeletal Assessment Tremor: No resting tremor  Bulk: Normal Tone: Cogwheel rigidity LE > UE. Hamstrings are spastic on L>R, Modified Ashworth 2 and 1, respectively.  GAIT: Distance walked: 80 ft Assistive device utilized: None Level of assistance: SBA Comments: Decreased arm swing, decreased trunk rotation with arm/leg swing, LLE decreased heel strike/mid-foot initial contact  Posture: Forward head, rounded shoulders   AROM  AROM (Normal range in degrees) AROM  04/22/2023  Lumbar   Flexion (65) 75%*  Extension (30) 50% no pain  Right lateral flexion (25) 75% (R-sided discomfort)  Left lateral flexion (25) 75% (R-sided discomfort)  Right rotation (30) 50%  Left rotation (30) 25%          Ankle    Dorsiflexion (20) WFL WFL  Plantarflexion (50)    Inversion (35)    Eversion (15    (* = pain; Blank rows = not tested)   LE MMT:  MMT (out of 5) Right 04/22/2023 Left 04/22/2023  Hip flexion 4- 3+  Hip extension    Hip abduction (seated) 5 5  Hip adduction (seated) 5 5  Hip internal rotation    Hip external rotation    Knee flexion 5 4  Knee extension 5 4  Ankle dorsiflexion 5 4-  Ankle plantarflexion    Ankle inversion    Ankle eversion    (* = pain; Blank rows = not tested)   Coordination/Cerebellar Finger to Nose: L-sided hypometria  Heel to Shin: L-sided impairment  Rapid alternating  movements: Impaired L>R, mild impairment LE toe tapping  Finger Opposition: WNL Pronator Drift: Positive LUE   FUNCTIONAL OUTCOME MEASURES (initial eval)   Results Comments  BERG 50/56 Fall risk, in need of intervention  DGI Next visit/24   TUG 13.6 seconds   5TSTS 35.2 seconds   (Blank rows = not tested)   Postural Control Screen Romberg: EO WNL, EC up to 30 sec with significant sway and near-LOB x 2   Lumbar Spine Screen (04/24/23) SLUMP: Negative SLR: Negative Repeated extension in standing: increases, worse pain locally along low back    TODAY'S TREATMENT    SUBJECTIVE STATEMENT:   Patient reports notable back pain over last few days after prolonged walking during [employer] [place] competition over the weekend. Patient reports feeling better after completing PT sessions. Patient reports his hips feel better after increased dosage of his monthly infusions for RA.    Therapeutic Exercise - improved strength as needed to improve performance of CKC activities/functional movements and  as needed for power production to prevent fall during episode of large postural perturbation   NuStep; Level 5, x 7 minutes - for improved soft tissue mobility and increased tissue temperature to improve muscle performance   -subjective gathered during this time  -Moist heat along low back for analgesic effect  // bars: high knees with opposite knee touch, 5-lb ankle weights; 5x D/B  PATIENT EDUCATION: Discussed modification for his seat setup at work with use of lumbar roll prn.      Neuromuscular Re-education - fall risk screening/management; for improved sensory integration, static and dynamic postural control, equilibrium and non-equilibrium coordination as needed for negotiating home and community environment and stepping over obstacles     Reverse long step with shoulder horizontal abduction, loud verbal count; 2 x 10 with alternating legs  Lateral long step with shoulder horizontal  abduction and cross-body reach, loud verbal count; 2 x 10 with alternating legs   Sit to stand with unweighted ball overhead reach, standing on Airex; 2x10    Obstacle course: alternating cone tap (4 cones on R and L); (3) 6-inch hurdles; Airex step up/down x 3 pads; 5x D/B    Tandem stance; up to 30 sec on each side     *not today* BOSU forward step up;   Toe tapping on single cone; 2 x 10 alternating R/L  Forward march along blue agility ladder, exaggerated arm and leg swing; 5x D/B  Toe tapping on 6-inch step; 2x10    PATIENT EDUCATION:  Education details: see above for patient education details Person educated: Patient Education method: Explanation Education comprehension: verbalized understanding   HOME EXERCISE PROGRAM: Access Code: [email or web address] URL: [email or web address] Date: 05/06/2023 Prepared by: Consuela Mimes  Exercises - Step Forward with Arms Reaching to Sides  - 2 x daily - 7 x weekly - 2 sets - 10 reps - Step Sideways with Arms Reaching  - 2 x daily - 7 x weekly - 2 sets - 10 reps - Sit to Stand with Arm Reach  - 2 x daily - 7 x weekly - 2 sets - 8 reps - Heel Toe Raises with Counter Support  - 2 x daily - 7 x weekly - 2 sets - 10 reps   Access Code: [email or web address] URL: [email or web address] Date: 04/24/2023 Prepared by: Consuela Mimes  Exercises - Supine Lower Trunk Rotation  - 2 x daily - 7 x weekly - 2 sets - 10 reps - Supine Piriformis Stretch with Foot on Ground  - 2 x daily - 7 x weekly - 3 sets - 30sec hold - Seated Hamstring Stretch  - 2 x daily - 7 x weekly - 3 sets - 30sec hold   ASSESSMENT:  CLINICAL IMPRESSION: Patient is adept with obstacle negotiation and has markedly improved capacity for multi-directional stepping with improved LLE toe clearance. Pt has ongoing truncal rigidity with his natural gait pattern, but no marked freezing or festinating while on amantadine. We will continue with progressing with  large-amplitude movements with plan for transition to robust HEP once PT goals and patient goals are met. Patient will benefit from skilled PT to address above impairments and improve overall function.  REHAB POTENTIAL: Good  CLINICAL DECISION MAKING: Evolving/moderate complexity  EVALUATION COMPLEXITY: High   GOALS: Goals reviewed with patient? Yes  SHORT TERM GOALS: Target date: 05/16/2023  Pt will be independent with HEP in order to improve strength and balance in order to decrease fall risk and improve function at home. Baseline: 04/22/23: Formal  HEP to be issued on visit # 2.   04/24/23: HEP given for thoracolumbar mobility, reviewed baseline exercises for LE strengthening/fall risk. Goal status: INITIAL   LONG TERM GOALS: Target date: 06/20/2023  Patient will improve MMT of tested LLE musculature to 4+/5 or greater indicative of improved strength as needed for power production to prevent fall and to improve performance of transfers and stair negotiation.  Baseline: 04/22/23: LLE gross strength 3+ to 4 (exception of seated hip ABD/ADD 5/5, not tested in standard testing position).  Goal status: INITIAL  2.  Pt will improve BERG by at least 3 points in order to demonstrate clinically significant improvement in balance.   Baseline: 04/22/23: To be completed on visit # 2.    04/24/23: 50/56    Goal status: INITIAL  3. Pt will decrease 5TSTS by at least 3 seconds in order to demonstrate clinically significant improvement in LE strength      Baseline: 04/22/23: 35.2 sec  Goal status: INITIAL  4. Pt will improve DGI by at least 3 points in order to demonstrate clinically significant improvement in balance and decreased risk for falls.     Baseline: 04/22/23: To be completed on visit # 3.  04/29/23: 15/24 Goal status: INITIAL  5. Pt will complete TUG in less than 11.5 sec indicative of decreased fall risk per clinical population Baseline: 04/22/23: 13.6 sec  Goal status:  INITIAL   PLAN: PT FREQUENCY: 2x/week  PT DURATION: 8-10 weeks  PLANNED INTERVENTIONS: Therapeutic exercises, Therapeutic activity, Neuromuscular re-education, Balance training, Gait training, Patient/Family education, Joint manipulation, Joint mobilization, Canalith repositioning, Aquatic Therapy, Dry Needling, Cognitive remediation, Electrical stimulation, Spinal manipulation, Spinal mobilization, Cryotherapy, Moist heat, Traction, Ultrasound, Ionotophoresis 4mg /ml Dexamethasone, and Manual therapy  PLAN FOR NEXT SESSION: Continue with postural control training, LE coordination training for toe clearance and stable weight shift, LE strengthening. Large-amplitude movements for HEP.     Denese Finn, PT, DPT #[id number]  Aleatha Hunting 05/27/2023, 5:47 PM

## 2023-05-29 ENCOUNTER — Ambulatory Visit: Payer: 59 | Admitting: Physical Therapy

## 2023-05-29 DIAGNOSIS — R2681 Unsteadiness on feet: Secondary | ICD-10-CM

## 2023-05-29 DIAGNOSIS — R262 Difficulty in walking, not elsewhere classified: Secondary | ICD-10-CM | POA: Diagnosis not present

## 2023-05-29 DIAGNOSIS — R2689 Other abnormalities of gait and mobility: Secondary | ICD-10-CM

## 2023-05-29 NOTE — Therapy (Signed)
 OUTPATIENT PHYSICAL THERAPY TREATMENT   Patient Name: Tyler Glover MRN: [id number] DOB:09-Feb-1961, 63 y.o., male Today's Date: 05/29/2023   PT End of Session - 05/29/23 1714     Visit Number 9    Number of Visits 17    Date for PT Re-Evaluation 06/20/23    PT Start Time 1715    PT Stop Time 1756    PT Time Calculation (min) 41 min    Equipment Utilized During Treatment Gait belt    Activity Tolerance Patient tolerated treatment well    Behavior During Therapy WFL for tasks assessed/performed                Past Medical History:  Diagnosis Date   Allergy    Arthritis    Rheumatoid   GERD (gastroesophageal reflux disease)    Hypertension    Past Surgical History:  Procedure Laterality Date   COLONOSCOPY     COLONOSCOPY WITH PROPOFOL  N/A 08/21/2019   Procedure: COLONOSCOPY WITH PROPOFOL ;  Surgeon: Marnee Sink, MD;  Location: [facility];  Service: Endoscopy;  Laterality: N/A;  priority 4   ESOPHAGOGASTRODUODENOSCOPY (EGD) WITH PROPOFOL  N/A 08/21/2019   Procedure: ESOPHAGOGASTRODUODENOSCOPY (EGD) WITH PROPOFOL ;  Surgeon: Marnee Sink, MD;  Location: [facility];  Service: Endoscopy;  Laterality: N/A;   POLYPECTOMY  08/21/2019   Procedure: POLYPECTOMY;  Surgeon: Marnee Sink, MD;  Location: [facility];  Service: Endoscopy;;   SHOULDER SURGERY     TESTICLE SURGERY     age 21 or 28   TONSILLECTOMY     Patient Active Problem List   Diagnosis Date Noted   Encounter for screening colonoscopy    Polyp of transverse colon    Heartburn    Pure hypercholesterolemia 01/26/2016   Right lateral epicondylitis 07/07/2015   Vitamin D  deficiency 12/24/2014   Prediabetes 12/24/2014   Allergic rhinitis, seasonal 06/03/2014   Acid reflux 06/03/2014   Essential (primary) hypertension 06/03/2014   Family history of diabetes mellitus 06/03/2014   Family history of cardiac disorder 06/03/2014   Obesity (BMI 30.0-34.9) 06/03/2014    PCP: Clarise Crooks,  MD  REFERRING PROVIDER: Aurther Lefevre, MD  REFERRING DIAGNOSIS:  G20.A1 (ICD-10-CM) - Parkinson's disease (HCC)    THERAPY DIAG: Difficulty in walking, not elsewhere classified  Unsteadiness on feet  Other abnormalities of gait and mobility  ONSET DATE: 02/2023  FOLLOW UP APPT WITH PROVIDER: Yes, f/u with referring provider in July 2025   RATIONALE FOR EVALUATION AND TREATMENT: Rehabilitation  Pertinent History Patient is a 63 year old male referred for gait/mobility deficits secondary to Parkinson's disease. Using amantadine for PD symptoms. Patient reports L side of his body is getting weaker. He reports he wants to be able to increase his activity. Pt reports comorbid low back pain and hip pain. Patient reports responding relatively well with monthly infusions of RA. Pt reports his LLE tends to drag - concerned for it catching on carpet.   Pain: Yes; multi-site associated with RA; low back and comorbid L knee pain.  Numbness/Tingling: No Focal Weakness: Yes; L-sided weakness, LE worse than UE Recent changes in overall health/medication: No Prior history of physical therapy for balance:  Yes; previous PT in [facility] outpatient clinic  Falls: Has patient fallen in last 6 months? No, Number of falls: N/A Directional pattern for falls: No Dominant hand: right Imaging: Yes ;  Normal brain MRI in 2023;   Prior level of function: Independent Occupational demands: IT trainer - M-F; p thas to hold  batteries, alternators, starters Hobbies: Traveling  Red flags (bowel/bladder changes, saddle paresthesia, personal history of cancer, h/o spinal tumors, h/o compression fx, h/o abdominal aneurysm, abdominal pain, chills/fever, night sweats, nausea, vomiting, unrelenting pain): Negative  Precautions: None  Weight Bearing Restrictions: No  Living Environment Lives with: lives with their spouse and mother  Lives in: House/apartment; one step to get into home; stairs to get  into home, pt has upstairs bedroom. Pt reports doing fairly well with stairs. Concrete from his car up to front steps  Has following equipment at home: None   Patient Goals: Improved mobility in L leg and L arm, improved strength in low back.     OBJECTIVE:   Patient Surveys  ABC: 91.3%  Cognition Patient is oriented to person, place, and time.  Recent memory is intact.  Remote memory is intact.  Attention span and concentration are intact.  Expressive speech is intact.  Patient's fund of knowledge is within normal limits for educational level.   Gross Musculoskeletal Assessment Tremor: No resting tremor  Bulk: Normal Tone: Cogwheel rigidity LE > UE. Hamstrings are spastic on L>R, Modified Ashworth 2 and 1, respectively.  GAIT: Distance walked: 80 ft Assistive device utilized: None Level of assistance: SBA Comments: Decreased arm swing, decreased trunk rotation with arm/leg swing, LLE decreased heel strike/mid-foot initial contact  Posture: Forward head, rounded shoulders   AROM  AROM (Normal range in degrees) AROM  04/22/2023  Lumbar   Flexion (65) 75%*  Extension (30) 50% no pain  Right lateral flexion (25) 75% (R-sided discomfort)  Left lateral flexion (25) 75% (R-sided discomfort)  Right rotation (30) 50%  Left rotation (30) 25%          Ankle    Dorsiflexion (20) WFL WFL  Plantarflexion (50)    Inversion (35)    Eversion (15    (* = pain; Blank rows = not tested)   LE MMT:  MMT (out of 5) Right 04/22/2023 Left 04/22/2023  Hip flexion 4- 3+  Hip extension    Hip abduction (seated) 5 5  Hip adduction (seated) 5 5  Hip internal rotation    Hip external rotation    Knee flexion 5 4  Knee extension 5 4  Ankle dorsiflexion 5 4-  Ankle plantarflexion    Ankle inversion    Ankle eversion    (* = pain; Blank rows = not tested)   Coordination/Cerebellar Finger to Nose: L-sided hypometria  Heel to Shin: L-sided impairment  Rapid alternating  movements: Impaired L>R, mild impairment LE toe tapping  Finger Opposition: WNL Pronator Drift: Positive LUE   FUNCTIONAL OUTCOME MEASURES (initial eval)   Results Comments  BERG 50/56 Fall risk, in need of intervention  DGI Next visit/24   TUG 13.6 seconds   5TSTS 35.2 seconds   (Blank rows = not tested)   Postural Control Screen Romberg: EO WNL, EC up to 30 sec with significant sway and near-LOB x 2   Lumbar Spine Screen (04/24/23) SLUMP: Negative SLR: Negative Repeated extension in standing: increases, worse pain locally along low back    TODAY'S TREATMENT    SUBJECTIVE STATEMENT:   Patient reports his back pain is better than that reported at last visit. Patient reports    Therapeutic Exercise - improved strength as needed to improve performance of CKC activities/functional movements and as needed for power production to prevent fall during episode of large postural perturbation   NuStep; Level 5, x 6 minutes - for improved soft tissue  mobility and increased tissue temperature to improve muscle performance   -subjective gathered during this time  -Moist heat along low back for analgesic effect  // bars: high knees with opposite knee touch, 5-lb ankle weights; 5x D/B  PATIENT EDUCATION: Discussed PT progress, prognosis    Neuromuscular Re-education - fall risk screening/management; for improved sensory integration, static and dynamic postural control, equilibrium and non-equilibrium coordination as needed for negotiating home and community environment and stepping over obstacles     Reverse long step with shoulder horizontal abduction, loud verbal count; 2 x 10 with alternating legs  Lateral long step with shoulder horizontal abduction and cross-body reach, loud verbal count; 2 x 10 with alternating legs   Sit to stand with unweighted ball overhead reach, standing on Airex; 2x10   6-inch step + Airex forward step up; 1 x 15 with each LE   Tandem stance, on  Airex pad; up to 30 sec on each side without notable LOB   *next visit*  Obstacle course: alternating cone tap (4 cones on R and L); (3) 6-inch hurdles; Airex step up/down x 3 pads; 5x D/B      *not today*   Toe tapping on single cone; 2 x 10 alternating R/L  Forward march along blue agility ladder, exaggerated arm and leg swing; 5x D/B  Toe tapping on 6-inch step; 2x10    PATIENT EDUCATION:  Education details: see above for patient education details Person educated: Patient Education method: Explanation Education comprehension: verbalized understanding   HOME EXERCISE PROGRAM: Access Code: [email or web address] URL: [email or web address] Date: 05/06/2023 Prepared by: Denese Finn  Exercises - Step Forward with Arms Reaching to Sides  - 2 x daily - 7 x weekly - 2 sets - 10 reps - Step Sideways with Arms Reaching  - 2 x daily - 7 x weekly - 2 sets - 10 reps - Sit to Stand with Arm Reach  - 2 x daily - 7 x weekly - 2 sets - 8 reps - Heel Toe Raises with Counter Support  - 2 x daily - 7 x weekly - 2 sets - 10 reps   Access Code: [email or web address] URL: [email or web address] Date: 04/24/2023 Prepared by: Denese Finn  Exercises - Supine Lower Trunk Rotation  - 2 x daily - 7 x weekly - 2 sets - 10 reps - Supine Piriformis Stretch with Foot on Ground  - 2 x daily - 7 x weekly - 3 sets - 30sec hold - Seated Hamstring Stretch  - 2 x daily - 7 x weekly - 3 sets - 30sec hold   ASSESSMENT:  CLINICAL IMPRESSION: Patient is able to perform full Tandem standing with no LOB today up to 30 seconds. He demonstrates safe negotiation of obstacles; 2 errors with lateral cone tapping today (cone falling accidentally during tapping activity), but no significant LOB requiring PT assistance to prevent fall. Pt is doing well with large-amplitude movements and is doing very well with PT so far. Patient will benefit from skilled PT to address above impairments and improve overall  function.  REHAB POTENTIAL: Good  CLINICAL DECISION MAKING: Evolving/moderate complexity  EVALUATION COMPLEXITY: High   GOALS: Goals reviewed with patient? Yes  SHORT TERM GOALS: Target date: 05/16/2023  Pt will be independent with HEP in order to improve strength and balance in order to decrease fall risk and improve function at home. Baseline: 04/22/23: Formal HEP to be issued on visit # 2.   04/24/23: HEP given for thoracolumbar mobility, reviewed baseline exercises for  LE strengthening/fall risk. Goal status: INITIAL   LONG TERM GOALS: Target date: 06/20/2023  Patient will improve MMT of tested LLE musculature to 4+/5 or greater indicative of improved strength as needed for power production to prevent fall and to improve performance of transfers and stair negotiation.  Baseline: 04/22/23: LLE gross strength 3+ to 4 (exception of seated hip ABD/ADD 5/5, not tested in standard testing position).  Goal status: INITIAL  2.  Pt will improve BERG by at least 3 points in order to demonstrate clinically significant improvement in balance.   Baseline: 04/22/23: To be completed on visit # 2.    04/24/23: 50/56    Goal status: INITIAL  3. Pt will decrease 5TSTS by at least 3 seconds in order to demonstrate clinically significant improvement in LE strength      Baseline: 04/22/23: 35.2 sec  Goal status: INITIAL  4. Pt will improve DGI by at least 3 points in order to demonstrate clinically significant improvement in balance and decreased risk for falls.     Baseline: 04/22/23: To be completed on visit # 3.  04/29/23: 15/24 Goal status: INITIAL  5. Pt will complete TUG in less than 11.5 sec indicative of decreased fall risk per clinical population Baseline: 04/22/23: 13.6 sec  Goal status: INITIAL   PLAN: PT FREQUENCY: 2x/week  PT DURATION: 8-10 weeks  PLANNED INTERVENTIONS: Therapeutic exercises, Therapeutic activity, Neuromuscular re-education, Balance training, Gait training,  Patient/Family education, Joint manipulation, Joint mobilization, Canalith repositioning, Aquatic Therapy, Dry Needling, Cognitive remediation, Electrical stimulation, Spinal manipulation, Spinal mobilization, Cryotherapy, Moist heat, Traction, Ultrasound, Ionotophoresis 4mg /ml Dexamethasone, and Manual therapy  PLAN FOR NEXT SESSION: Continue with postural control training, LE coordination training for toe clearance and stable weight shift, LE strengthening. Large-amplitude movements for HEP.     Denese Finn, PT, DPT #[id number]  Aleatha Hunting 05/29/2023, 5:19 PM

## 2023-05-31 ENCOUNTER — Encounter: Payer: Self-pay | Admitting: Physical Therapy

## 2023-06-03 ENCOUNTER — Encounter: Payer: Self-pay | Admitting: Physical Therapy

## 2023-06-03 ENCOUNTER — Ambulatory Visit: Payer: 59 | Admitting: Physical Therapy

## 2023-06-03 DIAGNOSIS — R2689 Other abnormalities of gait and mobility: Secondary | ICD-10-CM

## 2023-06-03 DIAGNOSIS — R2681 Unsteadiness on feet: Secondary | ICD-10-CM

## 2023-06-03 DIAGNOSIS — R262 Difficulty in walking, not elsewhere classified: Secondary | ICD-10-CM

## 2023-06-03 NOTE — Therapy (Signed)
 OUTPATIENT PHYSICAL THERAPY TREATMENT AND PROGRESS NOTE   Dates of reporting period  04/22/23   to   06/03/23    Patient Name: Tyler Glover MRN: [id number] DOB:1960-07-27, 63 y.o., male Today's Date: 06/03/2023   PT End of Session - 06/03/23 1727     Visit Number 10    Number of Visits 17    Date for PT Re-Evaluation 06/20/23    PT Start Time 1724    PT Stop Time 1800    PT Time Calculation (min) 36 min    Equipment Utilized During Treatment Gait belt    Activity Tolerance Patient tolerated treatment well    Behavior During Therapy WFL for tasks assessed/performed             Past Medical History:  Diagnosis Date   Allergy    Arthritis    Rheumatoid   GERD (gastroesophageal reflux disease)    Hypertension    Past Surgical History:  Procedure Laterality Date   COLONOSCOPY     COLONOSCOPY WITH PROPOFOL  N/A 08/21/2019   Procedure: COLONOSCOPY WITH PROPOFOL ;  Surgeon: Marnee Sink, MD;  Location: [facility];  Service: Endoscopy;  Laterality: N/A;  priority 4   ESOPHAGOGASTRODUODENOSCOPY (EGD) WITH PROPOFOL  N/A 08/21/2019   Procedure: ESOPHAGOGASTRODUODENOSCOPY (EGD) WITH PROPOFOL ;  Surgeon: Marnee Sink, MD;  Location: [facility];  Service: Endoscopy;  Laterality: N/A;   POLYPECTOMY  08/21/2019   Procedure: POLYPECTOMY;  Surgeon: Marnee Sink, MD;  Location: [facility];  Service: Endoscopy;;   SHOULDER SURGERY     TESTICLE SURGERY     age 38 or 72   TONSILLECTOMY     Patient Active Problem List   Diagnosis Date Noted   Encounter for screening colonoscopy    Polyp of transverse colon    Heartburn    Pure hypercholesterolemia 01/26/2016   Right lateral epicondylitis 07/07/2015   Vitamin D  deficiency 12/24/2014   Prediabetes 12/24/2014   Allergic rhinitis, seasonal 06/03/2014   Acid reflux 06/03/2014   Essential (primary) hypertension 06/03/2014   Family history of diabetes mellitus 06/03/2014   Family history of cardiac disorder  06/03/2014   Obesity (BMI 30.0-34.9) 06/03/2014    PCP: Clarise Crooks, MD  REFERRING PROVIDER: Aurther Lefevre, MD  REFERRING DIAGNOSIS:  G20.A1 (ICD-10-CM) - Parkinson's disease (HCC)    THERAPY DIAG: Difficulty in walking, not elsewhere classified  Unsteadiness on feet  Other abnormalities of gait and mobility  ONSET DATE: 02/2023  FOLLOW UP APPT WITH PROVIDER: Yes, f/u with referring provider in July 2025   RATIONALE FOR EVALUATION AND TREATMENT: Rehabilitation  Pertinent History Patient is a 63 year old male referred for gait/mobility deficits secondary to Parkinson's disease. Using amantadine for PD symptoms. Patient reports L side of his body is getting weaker. He reports he wants to be able to increase his activity. Pt reports comorbid low back pain and hip pain. Patient reports responding relatively well with monthly infusions of RA. Pt reports his LLE tends to drag - concerned for it catching on carpet.   Pain: Yes; multi-site associated with RA; low back and comorbid L knee pain.  Numbness/Tingling: No Focal Weakness: Yes; L-sided weakness, LE worse than UE Recent changes in overall health/medication: No Prior history of physical therapy for balance:  Yes; previous PT in [facility]  Falls: Has patient fallen in last 6 months? No, Number of falls: N/A Directional pattern for falls: No Dominant hand: right Imaging: Yes ;  Normal brain MRI in 2023;  Prior level of function: Independent Occupational demands: IT trainer - M-F; p thas to hold batteries, alternators, starters Hobbies: Traveling  Red flags (bowel/bladder changes, saddle paresthesia, personal history of cancer, h/o spinal tumors, h/o compression fx, h/o abdominal aneurysm, abdominal pain, chills/fever, night sweats, nausea, vomiting, unrelenting pain): Negative  Precautions: None  Weight Bearing Restrictions: No  Living Environment Lives with: lives with their spouse and  mother  Lives in: House/apartment; one step to get into home; stairs to get into home, pt has upstairs bedroom. Pt reports doing fairly well with stairs. Concrete from his car up to front steps  Has following equipment at home: None   Patient Goals: Improved mobility in L leg and L arm, improved strength in low back.     OBJECTIVE:   Patient Surveys  ABC: 91.3%  Cognition Patient is oriented to person, place, and time.  Recent memory is intact.  Remote memory is intact.  Attention span and concentration are intact.  Expressive speech is intact.  Patient's fund of knowledge is within normal limits for educational level.   Gross Musculoskeletal Assessment Tremor: No resting tremor  Bulk: Normal Tone: Cogwheel rigidity LE > UE. Hamstrings are spastic on L>R, Modified Ashworth 2 and 1, respectively.  GAIT: Distance walked: 80 ft Assistive device utilized: None Level of assistance: SBA Comments: Decreased arm swing, decreased trunk rotation with arm/leg swing, LLE decreased heel strike/mid-foot initial contact  Posture: Forward head, rounded shoulders   AROM  AROM (Normal range in degrees) AROM  04/22/2023  Lumbar   Flexion (65) 75%*  Extension (30) 50% no pain  Right lateral flexion (25) 75% (R-sided discomfort)  Left lateral flexion (25) 75% (R-sided discomfort)  Right rotation (30) 50%  Left rotation (30) 25%          Ankle    Dorsiflexion (20) WFL WFL  Plantarflexion (50)    Inversion (35)    Eversion (15    (* = pain; Blank rows = not tested)   LE MMT:  MMT (out of 5) Right 04/22/2023 Left 04/22/2023 Right 06/03/23 Left 06/03/23  Hip flexion 4- 3+ 5- 4-  Hip extension      Hip abduction (seated) 5 5 5 5   Hip adduction (seated) 5 5 5 5   Hip internal rotation      Hip external rotation      Knee flexion 5 4 5  4+  Knee extension 5 4 5 4   Ankle dorsiflexion 5 4- 5 4-  Ankle plantarflexion      Ankle inversion      Ankle eversion      (* = pain; Blank  rows = not tested)   Coordination/Cerebellar Finger to Nose: L-sided hypometria  Heel to Shin: L-sided impairment  Rapid alternating movements: Impaired L>R, mild impairment LE toe tapping  Finger Opposition: WNL Pronator Drift: Positive LUE   FUNCTIONAL OUTCOME MEASURES (initial eval)   Results Comments  BERG 50/56 Fall risk, in need of intervention  DGI Next visit/24   TUG 13.6 seconds   5TSTS 35.2 seconds   (Blank rows = not tested)   Postural Control Screen Romberg: EO WNL, EC up to 30 sec with significant sway and near-LOB x 2   Lumbar Spine Screen (04/24/23) SLUMP: Negative SLR: Negative Repeated extension in standing: increases, worse pain locally along low back    TODAY'S TREATMENT    SUBJECTIVE STATEMENT:   Patient reports slow progress with PT, but he feels he is improving. He recognizes improving ability to  perform activities while in PT. Patient reports he needs to work more on LLE weakness and clearance during swing phase.     *GOAL UPDATE PERFORMED   Therapeutic Exercise - improved strength as needed to improve performance of CKC activities/functional movements and as needed for power production to prevent fall during episode of large postural perturbation   NuStep; Level 5, x 5 minutes - for improved soft tissue mobility and increased tissue temperature to improve muscle performance   -subjective gathered during this time  -Moist heat along low back for analgesic effect  PATIENT EDUCATION: Discussed PT progress, prognosis, continued POC   *not today* // bars: high knees with opposite knee touch, 5-lb ankle weights; 5x D/B   Neuromuscular Re-education - fall risk screening/management; for improved sensory integration, static and dynamic postural control, equilibrium and non-equilibrium coordination as needed for negotiating home and community environment and stepping over obstacles   *BERG and DGI performance     *next visit*  Obstacle course:  alternating cone tap (4 cones on R and L); (3) 6-inch hurdles; Airex step up/down x 3 pads; 5x D/B  Reverse long step with shoulder horizontal abduction, loud verbal count; 2 x 10 with alternating legs  Lateral long step with shoulder horizontal abduction and cross-body reach, loud verbal count; 2 x 10 with alternating legs  Sit to stand with unweighted ball overhead reach, standing on Airex; 2x10 6-inch step + Airex forward step up; 1 x 15 with each LE  Tandem stance, on Airex pad; multiple attempts - up to 30 sec on each side without notable LOB     *not today*  Toe tapping on single cone; 2 x 10 alternating R/L  Forward march along blue agility ladder, exaggerated arm and leg swing; 5x D/B  Toe tapping on 6-inch step; 2x10    PATIENT EDUCATION:  Education details: see above for patient education details Person educated: Patient Education method: Explanation Education comprehension: verbalized understanding   HOME EXERCISE PROGRAM: Access Code: [email or web address] URL: [email or web address] Date: 05/06/2023 Prepared by: Denese Finn  Exercises - Step Forward with Arms Reaching to Sides  - 2 x daily - 7 x weekly - 2 sets - 10 reps - Step Sideways with Arms Reaching  - 2 x daily - 7 x weekly - 2 sets - 10 reps - Sit to Stand with Arm Reach  - 2 x daily - 7 x weekly - 2 sets - 8 reps - Heel Toe Raises with Counter Support  - 2 x daily - 7 x weekly - 2 sets - 10 reps   Access Code: [email or web address] URL: [email or web address] Date: 04/24/2023 Prepared by: Denese Finn  Exercises - Supine Lower Trunk Rotation  - 2 x daily - 7 x weekly - 2 sets - 10 reps - Supine Piriformis Stretch with Foot on Ground  - 2 x daily - 7 x weekly - 3 sets - 30sec hold - Seated Hamstring Stretch  - 2 x daily - 7 x weekly - 3 sets - 30sec hold   ASSESSMENT:  CLINICAL IMPRESSION: Patient has met goals for DGI, TUG,and 5-times sit to stand, indicative of decreasing fall risk. He  has improved MMT for some muscles, but no notable improvement with L hip flexors or dorsiflexors. BERG has improved, but he has not yet attained MCID for this outcome measure. Patient does report subjective benefit with PT, but he has remaining concern for LLE weakness and difficulty with clearance of L foot during swinging/stepping. Pt participates very well  with PT, and he would benefit from continued work on restoration of L>RLE strength, fall risk management, and balance training. Patient will benefit from skilled PT to address above impairments and improve overall function.  REHAB POTENTIAL: Good  CLINICAL DECISION MAKING: Evolving/moderate complexity  EVALUATION COMPLEXITY: High   GOALS: Goals reviewed with patient? Yes  SHORT TERM GOALS: Target date: 05/16/2023  Pt will be independent with HEP in order to improve strength and balance in order to decrease fall risk and improve function at home. Baseline: 04/22/23: Formal HEP to be issued on visit # 2.   04/24/23: HEP given for thoracolumbar mobility, reviewed baseline exercises for LE strengthening/fall risk.    06/03/23: Pt is 50% compliant; he has difficulty completing on some days.  Goal status: ON-GOING   LONG TERM GOALS: Target date: 06/20/2023  Patient will improve MMT of tested LLE musculature to 4+/5 or greater indicative of improved strength as needed for power production to prevent fall and to improve performance of transfers and stair negotiation.  Baseline: 04/22/23: LLE gross strength 3+ to 4 (exception of seated hip ABD/ADD 5/5, not tested in standard testing position).     06/03/23: Modest improvement in strength, no change in L quads or dorsiflexors. Goal status: NOT MET   2.  Pt will improve BERG by at least 3 points in order to demonstrate clinically significant improvement in balance.   Baseline: 04/22/23: To be completed on visit # 2.    04/24/23: 50/56       06/03/23: 52/56 Goal status: IN PROGRESS  3. Pt will decrease  5TSTS by at least 3 seconds in order to demonstrate clinically significant improvement in LE strength      Baseline: 04/22/23: 35.2 sec     06/03/23: 14.3 sec Goal status: ACHIEVED   4. Pt will improve DGI by at least 3 points in order to demonstrate clinically significant improvement in balance and decreased risk for falls.     Baseline: 04/22/23: To be completed on visit # 3.  04/29/23: 15/24    06/03/23: 18/24 Goal status: ACHIEVED   5. Pt will complete TUG in less than 11.5 sec indicative of decreased fall risk per clinical population Baseline: 04/22/23: 13.6 sec     06/03/23: 10.9 sec  Goal status: ACHIEVED    PLAN: PT FREQUENCY: 2x/week  PT DURATION: 4-6 weeks  PLANNED INTERVENTIONS: Therapeutic exercises, Therapeutic activity, Neuromuscular re-education, Balance training, Gait training, Patient/Family education, Joint manipulation, Joint mobilization, Canalith repositioning, Aquatic Therapy, Dry Needling, Cognitive remediation, Electrical stimulation, Spinal manipulation, Spinal mobilization, Cryotherapy, Moist heat, Traction, Ultrasound, Ionotophoresis 4mg /ml Dexamethasone, and Manual therapy  PLAN FOR NEXT SESSION: Continue with postural control training, LE coordination training for toe clearance and stable weight shift, LE strengthening. Large-amplitude movements for HEP.     Denese Finn, PT, DPT #[id number]  Aleatha Hunting 06/03/2023, 5:28 PM

## 2023-06-05 ENCOUNTER — Ambulatory Visit: Payer: 59 | Admitting: Physical Therapy

## 2023-06-05 DIAGNOSIS — R2689 Other abnormalities of gait and mobility: Secondary | ICD-10-CM

## 2023-06-05 DIAGNOSIS — R262 Difficulty in walking, not elsewhere classified: Secondary | ICD-10-CM

## 2023-06-05 DIAGNOSIS — R2681 Unsteadiness on feet: Secondary | ICD-10-CM

## 2023-06-05 NOTE — Therapy (Signed)
 OUTPATIENT PHYSICAL THERAPY TREATMENT   Patient Name: Tyler Glover MRN: [id number] DOB:1960-12-13, 63 y.o., male Today's Date: 06/05/2023   PT End of Session - 06/10/23 1302     Visit Number 11    Number of Visits 17    Date for PT Re-Evaluation 06/20/23    PT Start Time 1715    PT Stop Time 1757    PT Time Calculation (min) 42 min    Equipment Utilized During Treatment Gait belt    Activity Tolerance Patient tolerated treatment well    Behavior During Therapy WFL for tasks assessed/performed             Past Medical History:  Diagnosis Date   Allergy    Arthritis    Rheumatoid   GERD (gastroesophageal reflux disease)    Hypertension    Past Surgical History:  Procedure Laterality Date   COLONOSCOPY     COLONOSCOPY WITH PROPOFOL  N/A 08/21/2019   Procedure: COLONOSCOPY WITH PROPOFOL ;  Surgeon: Marnee Sink, MD;  Location: [facility];  Service: Endoscopy;  Laterality: N/A;  priority 4   ESOPHAGOGASTRODUODENOSCOPY (EGD) WITH PROPOFOL  N/A 08/21/2019   Procedure: ESOPHAGOGASTRODUODENOSCOPY (EGD) WITH PROPOFOL ;  Surgeon: Marnee Sink, MD;  Location: [facility];  Service: Endoscopy;  Laterality: N/A;   POLYPECTOMY  08/21/2019   Procedure: POLYPECTOMY;  Surgeon: Marnee Sink, MD;  Location: [facility];  Service: Endoscopy;;   SHOULDER SURGERY     TESTICLE SURGERY     age 43 or 15   TONSILLECTOMY     Patient Active Problem List   Diagnosis Date Noted   Encounter for screening colonoscopy    Polyp of transverse colon    Heartburn    Pure hypercholesterolemia 01/26/2016   Right lateral epicondylitis 07/07/2015   Vitamin D  deficiency 12/24/2014   Prediabetes 12/24/2014   Allergic rhinitis, seasonal 06/03/2014   Acid reflux 06/03/2014   Essential (primary) hypertension 06/03/2014   Family history of diabetes mellitus 06/03/2014   Family history of cardiac disorder 06/03/2014   Obesity (BMI 30.0-34.9) 06/03/2014    PCP: Clarise Crooks,  MD  REFERRING PROVIDER: Aurther Lefevre, MD  REFERRING DIAGNOSIS:  G20.A1 (ICD-10-CM) - Parkinson's disease (HCC)    THERAPY DIAG: Difficulty in walking, not elsewhere classified  Unsteadiness on feet  Other abnormalities of gait and mobility  ONSET DATE: 02/2023  FOLLOW UP APPT WITH PROVIDER: Yes, f/u with referring provider in July 2025   RATIONALE FOR EVALUATION AND TREATMENT: Rehabilitation  Pertinent History Patient is a 63 year old male referred for gait/mobility deficits secondary to Parkinson's disease. Using amantadine for PD symptoms. Patient reports L side of his body is getting weaker. He reports he wants to be able to increase his activity. Pt reports comorbid low back pain and hip pain. Patient reports responding relatively well with monthly infusions of RA. Pt reports his LLE tends to drag - concerned for it catching on carpet.   Pain: Yes; multi-site associated with RA; low back and comorbid L knee pain.  Numbness/Tingling: No Focal Weakness: Yes; L-sided weakness, LE worse than UE Recent changes in overall health/medication: No Prior history of physical therapy for balance:  Yes; previous PT in [facility] outpatient clinic  Falls: Has patient fallen in last 6 months? No, Number of falls: N/A Directional pattern for falls: No Dominant hand: right Imaging: Yes ;  Normal brain MRI in 2023;   Prior level of function: Independent Occupational demands: IT trainer - M-F; p thas to hold batteries, alternators, starters  Hobbies: Traveling  Red flags (bowel/bladder changes, saddle paresthesia, personal history of cancer, h/o spinal tumors, h/o compression fx, h/o abdominal aneurysm, abdominal pain, chills/fever, night sweats, nausea, vomiting, unrelenting pain): Negative  Precautions: None  Weight Bearing Restrictions: No  Living Environment Lives with: lives with their spouse and mother  Lives in: House/apartment; one step to get into home; stairs to get  into home, pt has upstairs bedroom. Pt reports doing fairly well with stairs. Concrete from his car up to front steps  Has following equipment at home: None   Patient Goals: Improved mobility in L leg and L arm, improved strength in low back.     OBJECTIVE:   Patient Surveys  ABC: 91.3%  Cognition Patient is oriented to person, place, and time.  Recent memory is intact.  Remote memory is intact.  Attention span and concentration are intact.  Expressive speech is intact.  Patient's fund of knowledge is within normal limits for educational level.   Gross Musculoskeletal Assessment Tremor: No resting tremor  Bulk: Normal Tone: Cogwheel rigidity LE > UE. Hamstrings are spastic on L>R, Modified Ashworth 2 and 1, respectively.  GAIT: Distance walked: 80 ft Assistive device utilized: None Level of assistance: SBA Comments: Decreased arm swing, decreased trunk rotation with arm/leg swing, LLE decreased heel strike/mid-foot initial contact  Posture: Forward head, rounded shoulders   AROM  AROM (Normal range in degrees) AROM  04/22/2023  Lumbar   Flexion (65) 75%*  Extension (30) 50% no pain  Right lateral flexion (25) 75% (R-sided discomfort)  Left lateral flexion (25) 75% (R-sided discomfort)  Right rotation (30) 50%  Left rotation (30) 25%          Ankle    Dorsiflexion (20) WFL WFL  Plantarflexion (50)    Inversion (35)    Eversion (15    (* = pain; Blank rows = not tested)   LE MMT:  MMT (out of 5) Right 04/22/2023 Left 04/22/2023 Right 06/03/23 Left 06/03/23  Hip flexion 4- 3+ 5- 4-  Hip extension      Hip abduction (seated) 5 5 5 5   Hip adduction (seated) 5 5 5 5   Hip internal rotation      Hip external rotation      Knee flexion 5 4 5  4+  Knee extension 5 4 5 4   Ankle dorsiflexion 5 4- 5 4-  Ankle plantarflexion      Ankle inversion      Ankle eversion      (* = pain; Blank rows = not tested)   Coordination/Cerebellar Finger to Nose: L-sided  hypometria  Heel to Shin: L-sided impairment  Rapid alternating movements: Impaired L>R, mild impairment LE toe tapping  Finger Opposition: WNL Pronator Drift: Positive LUE   FUNCTIONAL OUTCOME MEASURES (initial eval)   Results Comments  BERG 50/56 Fall risk, in need of intervention  DGI Next visit/24   TUG 13.6 seconds   5TSTS 35.2 seconds   (Blank rows = not tested)   Postural Control Screen Romberg: EO WNL, EC up to 30 sec with significant sway and near-LOB x 2   Lumbar Spine Screen (04/24/23) SLUMP: Negative SLR: Negative Repeated extension in standing: increases, worse pain locally along low back    TODAY'S TREATMENT    SUBJECTIVE STATEMENT:   Patient reports usual level of back pain at arrival. He reports doing better versus previous visit in regard to pain.     Therapeutic Exercise - improved strength as needed to improve performance of  CKC activities/functional movements and as needed for power production to prevent fall during episode of large postural perturbation   NuStep; Level 5, x 5 minutes - for improved soft tissue mobility and increased tissue temperature to improve muscle performance   -interval subjective gathered during this time, 2 min not billed   PATIENT EDUCATION: Discussed PT progress, prognosis, continued POC   *not today* // bars: high knees with opposite knee touch, 5-lb ankle weights; 5x D/B   Neuromuscular Re-education - fall risk screening/management; for improved sensory integration, static and dynamic postural control, equilibrium and non-equilibrium coordination as needed for negotiating home and community environment and stepping over obstacles    Obstacle course: alternating cone tap (4 cones on R and L); (3) 6-inch hurdles; Airex step up/down x 3 pads; 5x D/B   Reverse long step with shoulder horizontal abduction, loud verbal count; 2 x 10 with alternating legs  Lateral long step with shoulder horizontal abduction and  cross-body reach, loud verbal count; 2 x 10 with alternating legs   Sit to stand with unweighted ball overhead reach, standing on Airex; 2x10 6-inch step + Airex forward step up; 1 x 15 with each LE  Tandem stance, on Airex pad; multiple attempts - up to 30 sec on each side without notable LOB     *not today*  Toe tapping on single cone; 2 x 10 alternating R/L  Forward march along blue agility ladder, exaggerated arm and leg swing; 5x D/B  Toe tapping on 6-inch step; 2x10    PATIENT EDUCATION:  Education details: see above for patient education details Person educated: Patient Education method: Explanation Education comprehension: verbalized understanding   HOME EXERCISE PROGRAM: Access Code: [email or web address] URL: [email or web address] Date: 05/06/2023 Prepared by: Denese Finn  Exercises - Step Forward with Arms Reaching to Sides  - 2 x daily - 7 x weekly - 2 sets - 10 reps - Step Sideways with Arms Reaching  - 2 x daily - 7 x weekly - 2 sets - 10 reps - Sit to Stand with Arm Reach  - 2 x daily - 7 x weekly - 2 sets - 8 reps - Heel Toe Raises with Counter Support  - 2 x daily - 7 x weekly - 2 sets - 10 reps   Access Code: [email or web address] URL: [email or web address] Date: 04/24/2023 Prepared by: Denese Finn  Exercises - Supine Lower Trunk Rotation  - 2 x daily - 7 x weekly - 2 sets - 10 reps - Supine Piriformis Stretch with Foot on Ground  - 2 x daily - 7 x weekly - 3 sets - 30sec hold - Seated Hamstring Stretch  - 2 x daily - 7 x weekly - 3 sets - 30sec hold   ASSESSMENT:  CLINICAL IMPRESSION: Patient is doing well with progress c static balance and obstacle negotiation. Pt exhibits improved foot clearance during large-amplitude movements with LLE. Pt participates very well with PT, and he would benefit from continued work on restoration of L>RLE strength, fall risk management, and balance training. Patient will benefit from skilled PT to address  above impairments and improve overall function.  REHAB POTENTIAL: Good  CLINICAL DECISION MAKING: Evolving/moderate complexity  EVALUATION COMPLEXITY: High   GOALS: Goals reviewed with patient? Yes  SHORT TERM GOALS: Target date: 05/16/2023  Pt will be independent with HEP in order to improve strength and balance in order to decrease fall risk and improve function at home. Baseline: 04/22/23: Formal HEP to be issued on visit # 2.  04/24/23: HEP given for thoracolumbar mobility, reviewed baseline exercises for LE strengthening/fall risk.    06/03/23: Pt is 50% compliant; he has difficulty completing on some days.  Goal status: ON-GOING   LONG TERM GOALS: Target date: 06/20/2023  Patient will improve MMT of tested LLE musculature to 4+/5 or greater indicative of improved strength as needed for power production to prevent fall and to improve performance of transfers and stair negotiation.  Baseline: 04/22/23: LLE gross strength 3+ to 4 (exception of seated hip ABD/ADD 5/5, not tested in standard testing position).     06/03/23: Modest improvement in strength, no change in L quads or dorsiflexors. Goal status: NOT MET   2.  Pt will improve BERG by at least 3 points in order to demonstrate clinically significant improvement in balance.   Baseline: 04/22/23: To be completed on visit # 2.    04/24/23: 50/56       06/03/23: 52/56 Goal status: IN PROGRESS  3. Pt will decrease 5TSTS by at least 3 seconds in order to demonstrate clinically significant improvement in LE strength      Baseline: 04/22/23: 35.2 sec     06/03/23: 14.3 sec Goal status: ACHIEVED   4. Pt will improve DGI by at least 3 points in order to demonstrate clinically significant improvement in balance and decreased risk for falls.     Baseline: 04/22/23: To be completed on visit # 3.  04/29/23: 15/24    06/03/23: 18/24 Goal status: ACHIEVED   5. Pt will complete TUG in less than 11.5 sec indicative of decreased fall risk per clinical  population Baseline: 04/22/23: 13.6 sec     06/03/23: 10.9 sec  Goal status: ACHIEVED    PLAN: PT FREQUENCY: 2x/week  PT DURATION: 4-6 weeks  PLANNED INTERVENTIONS: Therapeutic exercises, Therapeutic activity, Neuromuscular re-education, Balance training, Gait training, Patient/Family education, Joint manipulation, Joint mobilization, Canalith repositioning, Aquatic Therapy, Dry Needling, Cognitive remediation, Electrical stimulation, Spinal manipulation, Spinal mobilization, Cryotherapy, Moist heat, Traction, Ultrasound, Ionotophoresis 4mg /ml Dexamethasone, and Manual therapy  PLAN FOR NEXT SESSION: Continue with postural control training, LE coordination training for toe clearance and stable weight shift, LE strengthening. Large-amplitude movements for HEP.     Denese Finn, PT, DPT #[id number]  Aleatha Hunting 06/10/2023, 1:02 PM

## 2023-06-10 ENCOUNTER — Ambulatory Visit: Payer: 59 | Admitting: Physical Therapy

## 2023-06-10 ENCOUNTER — Encounter: Payer: Self-pay | Admitting: Physical Therapy

## 2023-06-10 DIAGNOSIS — R262 Difficulty in walking, not elsewhere classified: Secondary | ICD-10-CM

## 2023-06-10 DIAGNOSIS — R2689 Other abnormalities of gait and mobility: Secondary | ICD-10-CM

## 2023-06-10 DIAGNOSIS — R2681 Unsteadiness on feet: Secondary | ICD-10-CM

## 2023-06-10 NOTE — Therapy (Unsigned)
 OUTPATIENT PHYSICAL THERAPY TREATMENT   Patient Name: Tyler Glover MRN: [id number] DOB:October 03, 1960, 63 y.o., male Today's Date: 06/10/2023   PT End of Session - 06/10/23 1722     Visit Number 12    Number of Visits 17    Date for PT Re-Evaluation 06/20/23    PT Start Time 1717    PT Stop Time 1758    PT Time Calculation (min) 41 min    Equipment Utilized During Treatment Gait belt    Activity Tolerance Patient tolerated treatment well    Behavior During Therapy WFL for tasks assessed/performed             Past Medical History:  Diagnosis Date   Allergy    Arthritis    Rheumatoid   GERD (gastroesophageal reflux disease)    Hypertension    Past Surgical History:  Procedure Laterality Date   COLONOSCOPY     COLONOSCOPY WITH PROPOFOL  N/A 08/21/2019   Procedure: COLONOSCOPY WITH PROPOFOL ;  Surgeon: Marnee Sink, MD;  Location: [facility];  Service: Endoscopy;  Laterality: N/A;  priority 4   ESOPHAGOGASTRODUODENOSCOPY (EGD) WITH PROPOFOL  N/A 08/21/2019   Procedure: ESOPHAGOGASTRODUODENOSCOPY (EGD) WITH PROPOFOL ;  Surgeon: Marnee Sink, MD;  Location: [facility];  Service: Endoscopy;  Laterality: N/A;   POLYPECTOMY  08/21/2019   Procedure: POLYPECTOMY;  Surgeon: Marnee Sink, MD;  Location: [facility];  Service: Endoscopy;;   SHOULDER SURGERY     TESTICLE SURGERY     age 59 or 75   TONSILLECTOMY     Patient Active Problem List   Diagnosis Date Noted   Encounter for screening colonoscopy    Polyp of transverse colon    Heartburn    Pure hypercholesterolemia 01/26/2016   Right lateral epicondylitis 07/07/2015   Vitamin D  deficiency 12/24/2014   Prediabetes 12/24/2014   Allergic rhinitis, seasonal 06/03/2014   Acid reflux 06/03/2014   Essential (primary) hypertension 06/03/2014   Family history of diabetes mellitus 06/03/2014   Family history of cardiac disorder 06/03/2014   Obesity (BMI 30.0-34.9) 06/03/2014    PCP: Clarise Crooks,  MD  REFERRING PROVIDER: Aurther Lefevre, MD  REFERRING DIAGNOSIS:  G20.A1 (ICD-10-CM) - Parkinson's disease (HCC)    THERAPY DIAG: Difficulty in walking, not elsewhere classified  Unsteadiness on feet  Other abnormalities of gait and mobility  ONSET DATE: 02/2023  FOLLOW UP APPT WITH PROVIDER: Yes, f/u with referring provider in July 2025   RATIONALE FOR EVALUATION AND TREATMENT: Rehabilitation  Pertinent History Patient is a 63 year old male referred for gait/mobility deficits secondary to Parkinson's disease. Using amantadine for PD symptoms. Patient reports L side of his body is getting weaker. He reports he wants to be able to increase his activity. Pt reports comorbid low back pain and hip pain. Patient reports responding relatively well with monthly infusions of RA. Pt reports his LLE tends to drag - concerned for it catching on carpet.   Pain: Yes; multi-site associated with RA; low back and comorbid L knee pain.  Numbness/Tingling: No Focal Weakness: Yes; L-sided weakness, LE worse than UE Recent changes in overall health/medication: No Prior history of physical therapy for balance:  Yes; previous PT in [facility] outpatient clinic  Falls: Has patient fallen in last 6 months? No, Number of falls: N/A Directional pattern for falls: No Dominant hand: right Imaging: Yes ;  Normal brain MRI in 2023;   Prior level of function: Independent Occupational demands: IT trainer - M-F; p thas to hold batteries, alternators, starters  Hobbies: Traveling  Red flags (bowel/bladder changes, saddle paresthesia, personal history of cancer, h/o spinal tumors, h/o compression fx, h/o abdominal aneurysm, abdominal pain, chills/fever, night sweats, nausea, vomiting, unrelenting pain): Negative  Precautions: None  Weight Bearing Restrictions: No  Living Environment Lives with: lives with their spouse and mother  Lives in: House/apartment; one step to get into home; stairs to get  into home, pt has upstairs bedroom. Pt reports doing fairly well with stairs. Concrete from his car up to front steps  Has following equipment at home: None   Patient Goals: Improved mobility in L leg and L arm, improved strength in low back.     OBJECTIVE:   Patient Surveys  ABC: 91.3%  Cognition Patient is oriented to person, place, and time.  Recent memory is intact.  Remote memory is intact.  Attention span and concentration are intact.  Expressive speech is intact.  Patient's fund of knowledge is within normal limits for educational level.   Gross Musculoskeletal Assessment Tremor: No resting tremor  Bulk: Normal Tone: Cogwheel rigidity LE > UE. Hamstrings are spastic on L>R, Modified Ashworth 2 and 1, respectively.  GAIT: Distance walked: 80 ft Assistive device utilized: None Level of assistance: SBA Comments: Decreased arm swing, decreased trunk rotation with arm/leg swing, LLE decreased heel strike/mid-foot initial contact  Posture: Forward head, rounded shoulders   AROM  AROM (Normal range in degrees) AROM  04/22/2023  Lumbar   Flexion (65) 75%*  Extension (30) 50% no pain  Right lateral flexion (25) 75% (R-sided discomfort)  Left lateral flexion (25) 75% (R-sided discomfort)  Right rotation (30) 50%  Left rotation (30) 25%          Ankle    Dorsiflexion (20) WFL WFL  Plantarflexion (50)    Inversion (35)    Eversion (15    (* = pain; Blank rows = not tested)   LE MMT:  MMT (out of 5) Right 04/22/2023 Left 04/22/2023 Right 06/03/23 Left 06/03/23  Hip flexion 4- 3+ 5- 4-  Hip extension      Hip abduction (seated) 5 5 5 5   Hip adduction (seated) 5 5 5 5   Hip internal rotation      Hip external rotation      Knee flexion 5 4 5  4+  Knee extension 5 4 5 4   Ankle dorsiflexion 5 4- 5 4-  Ankle plantarflexion      Ankle inversion      Ankle eversion      (* = pain; Blank rows = not tested)   Coordination/Cerebellar Finger to Nose: L-sided  hypometria  Heel to Shin: L-sided impairment  Rapid alternating movements: Impaired L>R, mild impairment LE toe tapping  Finger Opposition: WNL Pronator Drift: Positive LUE   FUNCTIONAL OUTCOME MEASURES (initial eval)   Results Comments  BERG 50/56 Fall risk, in need of intervention  DGI Next visit/24   TUG 13.6 seconds   5TSTS 35.2 seconds   (Blank rows = not tested)   Postural Control Screen Romberg: EO WNL, EC up to 30 sec with significant sway and near-LOB x 2   Lumbar Spine Screen (04/24/23) SLUMP: Negative SLR: Negative Repeated extension in standing: increases, worse pain locally along low back    TODAY'S TREATMENT    SUBJECTIVE STATEMENT:   Patient reports "nothing out of the ordinary" in regard to his mobility. Patient reports     Therapeutic Exercise - improved strength as needed to improve performance of CKC activities/functional movements and as needed  for power production to prevent fall during episode of large postural perturbation   NuStep; Level 5, x 5 minutes - for improved soft tissue mobility and increased tissue temperature to improve muscle performance   -interval subjective gathered during this time   PATIENT EDUCATION: Discussed PT progress, prognosis, continued POC   *not today* // bars: high knees with opposite knee touch, 5-lb ankle weights; 5x D/B   Neuromuscular Re-education - fall risk screening/management; for improved sensory integration, static and dynamic postural control, equilibrium and non-equilibrium coordination as needed for negotiating home and community environment and stepping over obstacles   Exaggerated arm and leg swing with 4-lb ankle weights; // bars;    Obstacle course: alternating cone tap (4 cones on R and L); (3) 6-inch hurdles; Airex step up/down x 3 pads; 5x D/B   Sit to stand with 4-lb Med ball overhead reach, standing on Airex; 2x10  6-inch step + Airex forward step up; 1 x 15 with each LE  Tandem stance,  on Airex pad; multiple attempts - up to 30 sec on each side without notable LOB     *not today*  Toe tapping on single cone; 2 x 10 alternating R/L  Forward march along blue agility ladder, exaggerated arm and leg swing; 5x D/B  Toe tapping on 6-inch step; 2x10    PATIENT EDUCATION:  Education details: see above for patient education details Person educated: Patient Education method: Explanation Education comprehension: verbalized understanding   HOME EXERCISE PROGRAM: Access Code: [email or web address] URL: [email or web address] Date: 05/06/2023 Prepared by: Denese Finn  Exercises - Step Forward with Arms Reaching to Sides  - 2 x daily - 7 x weekly - 2 sets - 10 reps - Step Sideways with Arms Reaching  - 2 x daily - 7 x weekly - 2 sets - 10 reps - Sit to Stand with Arm Reach  - 2 x daily - 7 x weekly - 2 sets - 8 reps - Heel Toe Raises with Counter Support  - 2 x daily - 7 x weekly - 2 sets - 10 reps   Access Code: [email or web address] URL: [email or web address] Date: 04/24/2023 Prepared by: Denese Finn  Exercises - Supine Lower Trunk Rotation  - 2 x daily - 7 x weekly - 2 sets - 10 reps - Supine Piriformis Stretch with Foot on Ground  - 2 x daily - 7 x weekly - 3 sets - 30sec hold - Seated Hamstring Stretch  - 2 x daily - 7 x weekly - 3 sets - 30sec hold   ASSESSMENT:  CLINICAL IMPRESSION: Patient is doing well with progress c static balance and obstacle negotiation. Pt exhibits improved foot clearance during large-amplitude movements with LLE. Pt participates very well with PT, and he would benefit from continued work on restoration of L>RLE strength, fall risk management, and balance training. Patient will benefit from skilled PT to address above impairments and improve overall function.  REHAB POTENTIAL: Good  CLINICAL DECISION MAKING: Evolving/moderate complexity  EVALUATION COMPLEXITY: High   GOALS: Goals reviewed with patient? Yes  SHORT TERM  GOALS: Target date: 05/16/2023  Pt will be independent with HEP in order to improve strength and balance in order to decrease fall risk and improve function at home. Baseline: 04/22/23: Formal HEP to be issued on visit # 2.   04/24/23: HEP given for thoracolumbar mobility, reviewed baseline exercises for LE strengthening/fall risk.    06/03/23: Pt is 50% compliant; he has difficulty completing on some days.  Goal status: ON-GOING  LONG TERM GOALS: Target date: 06/20/2023  Patient will improve MMT of tested LLE musculature to 4+/5 or greater indicative of improved strength as needed for power production to prevent fall and to improve performance of transfers and stair negotiation.  Baseline: 04/22/23: LLE gross strength 3+ to 4 (exception of seated hip ABD/ADD 5/5, not tested in standard testing position).     06/03/23: Modest improvement in strength, no change in L quads or dorsiflexors. Goal status: NOT MET   2.  Pt will improve BERG by at least 3 points in order to demonstrate clinically significant improvement in balance.   Baseline: 04/22/23: To be completed on visit # 2.    04/24/23: 50/56       06/03/23: 52/56 Goal status: IN PROGRESS  3. Pt will decrease 5TSTS by at least 3 seconds in order to demonstrate clinically significant improvement in LE strength      Baseline: 04/22/23: 35.2 sec     06/03/23: 14.3 sec Goal status: ACHIEVED   4. Pt will improve DGI by at least 3 points in order to demonstrate clinically significant improvement in balance and decreased risk for falls.     Baseline: 04/22/23: To be completed on visit # 3.  04/29/23: 15/24    06/03/23: 18/24 Goal status: ACHIEVED   5. Pt will complete TUG in less than 11.5 sec indicative of decreased fall risk per clinical population Baseline: 04/22/23: 13.6 sec     06/03/23: 10.9 sec  Goal status: ACHIEVED    PLAN: PT FREQUENCY: 2x/week  PT DURATION: 4-6 weeks  PLANNED INTERVENTIONS: Therapeutic exercises, Therapeutic activity,  Neuromuscular re-education, Balance training, Gait training, Patient/Family education, Joint manipulation, Joint mobilization, Canalith repositioning, Aquatic Therapy, Dry Needling, Cognitive remediation, Electrical stimulation, Spinal manipulation, Spinal mobilization, Cryotherapy, Moist heat, Traction, Ultrasound, Ionotophoresis 4mg /ml Dexamethasone, and Manual therapy  PLAN FOR NEXT SESSION: Continue with postural control training, LE coordination training for toe clearance and stable weight shift, LE strengthening. Large-amplitude movements for HEP.     Denese Finn, PT, DPT #[id number]  Aleatha Hunting 06/10/2023, 5:23 PM

## 2023-06-12 ENCOUNTER — Ambulatory Visit: Payer: 59 | Admitting: Physical Therapy

## 2023-06-12 DIAGNOSIS — R2689 Other abnormalities of gait and mobility: Secondary | ICD-10-CM

## 2023-06-12 DIAGNOSIS — R2681 Unsteadiness on feet: Secondary | ICD-10-CM

## 2023-06-12 DIAGNOSIS — R262 Difficulty in walking, not elsewhere classified: Secondary | ICD-10-CM

## 2023-06-12 NOTE — Therapy (Signed)
 OUTPATIENT PHYSICAL THERAPY TREATMENT   Patient Name: Tyler Glover MRN: [id number] DOB:06/12/1960, 63 y.o., male Today's Date: 06/12/2023   PT End of Session - 06/12/23 1716     Visit Number 13    Number of Visits 17    Date for PT Re-Evaluation 06/20/23    PT Start Time 1715    PT Stop Time 1800    PT Time Calculation (min) 45 min    Equipment Utilized During Treatment Gait belt    Activity Tolerance Patient tolerated treatment well    Behavior During Therapy WFL for tasks assessed/performed              Past Medical History:  Diagnosis Date   Allergy    Arthritis    Rheumatoid   GERD (gastroesophageal reflux disease)    Hypertension    Past Surgical History:  Procedure Laterality Date   COLONOSCOPY     COLONOSCOPY WITH PROPOFOL  N/A 08/21/2019   Procedure: COLONOSCOPY WITH PROPOFOL ;  Surgeon: Marnee Sink, MD;  Location: [facility];  Service: Endoscopy;  Laterality: N/A;  priority 4   ESOPHAGOGASTRODUODENOSCOPY (EGD) WITH PROPOFOL  N/A 08/21/2019   Procedure: ESOPHAGOGASTRODUODENOSCOPY (EGD) WITH PROPOFOL ;  Surgeon: Marnee Sink, MD;  Location: [facility];  Service: Endoscopy;  Laterality: N/A;   POLYPECTOMY  08/21/2019   Procedure: POLYPECTOMY;  Surgeon: Marnee Sink, MD;  Location: [facility];  Service: Endoscopy;;   SHOULDER SURGERY     TESTICLE SURGERY     age 73 or 66   TONSILLECTOMY     Patient Active Problem List   Diagnosis Date Noted   Encounter for screening colonoscopy    Polyp of transverse colon    Heartburn    Pure hypercholesterolemia 01/26/2016   Right lateral epicondylitis 07/07/2015   Vitamin D  deficiency 12/24/2014   Prediabetes 12/24/2014   Allergic rhinitis, seasonal 06/03/2014   Acid reflux 06/03/2014   Essential (primary) hypertension 06/03/2014   Family history of diabetes mellitus 06/03/2014   Family history of cardiac disorder 06/03/2014   Obesity (BMI 30.0-34.9) 06/03/2014    PCP: Clarise Crooks,  MD  REFERRING PROVIDER: Aurther Lefevre, MD  REFERRING DIAGNOSIS:  G20.A1 (ICD-10-CM) - Parkinson's disease (HCC)    THERAPY DIAG: Difficulty in walking, not elsewhere classified  Unsteadiness on feet  Other abnormalities of gait and mobility  ONSET DATE: 02/2023  FOLLOW UP APPT WITH PROVIDER: Yes, f/u with referring provider in July 2025   RATIONALE FOR EVALUATION AND TREATMENT: Rehabilitation  Pertinent History Patient is a 63 year old male referred for gait/mobility deficits secondary to Parkinson's disease. Using amantadine for PD symptoms. Patient reports L side of his body is getting weaker. He reports he wants to be able to increase his activity. Pt reports comorbid low back pain and hip pain. Patient reports responding relatively well with monthly infusions of RA. Pt reports his LLE tends to drag - concerned for it catching on carpet.   Pain: Yes; multi-site associated with RA; low back and comorbid L knee pain.  Numbness/Tingling: No Focal Weakness: Yes; L-sided weakness, LE worse than UE Recent changes in overall health/medication: No Prior history of physical therapy for balance:  Yes; previous PT in [facility] outpatient clinic  Falls: Has patient fallen in last 6 months? No, Number of falls: N/A Directional pattern for falls: No Dominant hand: right Imaging: Yes ;  Normal brain MRI in 2023;   Prior level of function: Independent Occupational demands: IT trainer - M-F; p thas to hold batteries, alternators,  starters Hobbies: Traveling  Red flags (bowel/bladder changes, saddle paresthesia, personal history of cancer, h/o spinal tumors, h/o compression fx, h/o abdominal aneurysm, abdominal pain, chills/fever, night sweats, nausea, vomiting, unrelenting pain): Negative  Precautions: None  Weight Bearing Restrictions: No  Living Environment Lives with: lives with their spouse and mother  Lives in: House/apartment; one step to get into home; stairs to get  into home, pt has upstairs bedroom. Pt reports doing fairly well with stairs. Concrete from his car up to front steps  Has following equipment at home: None   Patient Goals: Improved mobility in L leg and L arm, improved strength in low back.     OBJECTIVE:   Patient Surveys  ABC: 91.3%  Cognition Patient is oriented to person, place, and time.  Recent memory is intact.  Remote memory is intact.  Attention span and concentration are intact.  Expressive speech is intact.  Patient's fund of knowledge is within normal limits for educational level.   Gross Musculoskeletal Assessment Tremor: No resting tremor  Bulk: Normal Tone: Cogwheel rigidity LE > UE. Hamstrings are spastic on L>R, Modified Ashworth 2 and 1, respectively.  GAIT: Distance walked: 80 ft Assistive device utilized: None Level of assistance: SBA Comments: Decreased arm swing, decreased trunk rotation with arm/leg swing, LLE decreased heel strike/mid-foot initial contact  Posture: Forward head, rounded shoulders   AROM  AROM (Normal range in degrees) AROM  04/22/2023  Lumbar   Flexion (65) 75%*  Extension (30) 50% no pain  Right lateral flexion (25) 75% (R-sided discomfort)  Left lateral flexion (25) 75% (R-sided discomfort)  Right rotation (30) 50%  Left rotation (30) 25%          Ankle    Dorsiflexion (20) WFL WFL  Plantarflexion (50)    Inversion (35)    Eversion (15    (* = pain; Blank rows = not tested)   LE MMT:  MMT (out of 5) Right 04/22/2023 Left 04/22/2023 Right 06/03/23 Left 06/03/23  Hip flexion 4- 3+ 5- 4-  Hip extension      Hip abduction (seated) 5 5 5 5   Hip adduction (seated) 5 5 5 5   Hip internal rotation      Hip external rotation      Knee flexion 5 4 5  4+  Knee extension 5 4 5 4   Ankle dorsiflexion 5 4- 5 4-  Ankle plantarflexion      Ankle inversion      Ankle eversion      (* = pain; Blank rows = not tested)   Coordination/Cerebellar Finger to Nose: L-sided  hypometria  Heel to Shin: L-sided impairment  Rapid alternating movements: Impaired L>R, mild impairment LE toe tapping  Finger Opposition: WNL Pronator Drift: Positive LUE   FUNCTIONAL OUTCOME MEASURES (initial eval)   Results Comments  BERG 50/56 Fall risk, in need of intervention  DGI Next visit/24   TUG 13.6 seconds   5TSTS 35.2 seconds   (Blank rows = not tested)   Postural Control Screen Romberg: EO WNL, EC up to 30 sec with significant sway and near-LOB x 2   Lumbar Spine Screen (04/24/23) SLUMP: Negative SLR: Negative Repeated extension in standing: increases, worse pain locally along low back    TODAY'S TREATMENT    SUBJECTIVE STATEMENT:   Patient reports back is better generally versus earlier this week. He reports difficulty sleeping due to his work. He reports some back soreness after last visit. Patient reports    Therapeutic Exercise -  improved strength as needed to improve performance of CKC activities/functional movements and as needed for power production to prevent fall during episode of large postural perturbation   NuStep; Level 5, x 5 minutes - for improved soft tissue mobility and increased tissue temperature to improve muscle performance   -interval subjective gathered during this time   PATIENT EDUCATION: Discussed role of PT/rationale for exercises, prognosis   *not today* // bars: high knees with opposite knee touch, 5-lb ankle weights; 5x D/B   Neuromuscular Re-education - fall risk screening/management; for improved sensory integration, static and dynamic postural control, equilibrium and non-equilibrium coordination as needed for negotiating home and community environment and stepping over obstacles   Exaggerated arm and leg swing with 4-lb ankle weights; // bars; 5x D/B length of bars    Hurdle stepping: (3) 6-inch hurdles and (2) 12-inch hurdles; 4x D/B   Sit to stand with 4-lb Med ball overhead reach, standing on Airex;  2x10   Obstacle course: alternating cone tap (4 cones on R and L); (3) 6-inch hurdles; Airex step up/down x 3 pads; 5x D/B  6-inch step + Airex forward step up; 1 x 15 with each LE  Forward step up to BOSU ball; x10 with each LE leading    Tandem stance, on Airex pad; multiple attempts - up to 20 sec on each side without notable LOB   *not today*  Toe tapping on single cone; 2 x 10 alternating R/L  Forward march along blue agility ladder, exaggerated arm and leg swing; 5x D/B  Toe tapping on 6-inch step; 2x10    PATIENT EDUCATION:  Education details: see above for patient education details Person educated: Patient Education method: Explanation Education comprehension: verbalized understanding   HOME EXERCISE PROGRAM: Access Code: [email or web address] URL: [email or web address] Date: 05/06/2023 Prepared by: Denese Finn  Exercises - Step Forward with Arms Reaching to Sides  - 2 x daily - 7 x weekly - 2 sets - 10 reps - Step Sideways with Arms Reaching  - 2 x daily - 7 x weekly - 2 sets - 10 reps - Sit to Stand with Arm Reach  - 2 x daily - 7 x weekly - 2 sets - 8 reps - Heel Toe Raises with Counter Support  - 2 x daily - 7 x weekly - 2 sets - 10 reps   Access Code: [email or web address] URL: [email or web address] Date: 04/24/2023 Prepared by: Denese Finn  Exercises - Supine Lower Trunk Rotation  - 2 x daily - 7 x weekly - 2 sets - 10 reps - Supine Piriformis Stretch with Foot on Ground  - 2 x daily - 7 x weekly - 3 sets - 30sec hold - Seated Hamstring Stretch  - 2 x daily - 7 x weekly - 3 sets - 30sec hold   ASSESSMENT:  CLINICAL IMPRESSION: Patient is able to significantly progress with higher-level postural control and dynamic balance drills, including stepping on uneven surfaces and completing Sharpened Romberg on Airex. Pt is improving with ability to clear LLE during obstacle negotiation and gait. Pt participates very well with PT, and he would benefit  from continued work on restoration of L>RLE strength, fall risk management, and balance training. Patient will benefit from skilled PT to address above impairments and improve overall function.  REHAB POTENTIAL: Good  CLINICAL DECISION MAKING: Evolving/moderate complexity  EVALUATION COMPLEXITY: High   GOALS: Goals reviewed with patient? Yes  SHORT TERM GOALS: Target date: 05/16/2023  Pt will be independent with HEP in order  to improve strength and balance in order to decrease fall risk and improve function at home. Baseline: 04/22/23: Formal HEP to be issued on visit # 2.   04/24/23: HEP given for thoracolumbar mobility, reviewed baseline exercises for LE strengthening/fall risk.    06/03/23: Pt is 50% compliant; he has difficulty completing on some days.  Goal status: ON-GOING   LONG TERM GOALS: Target date: 06/20/2023  Patient will improve MMT of tested LLE musculature to 4+/5 or greater indicative of improved strength as needed for power production to prevent fall and to improve performance of transfers and stair negotiation.  Baseline: 04/22/23: LLE gross strength 3+ to 4 (exception of seated hip ABD/ADD 5/5, not tested in standard testing position).     06/03/23: Modest improvement in strength, no change in L quads or dorsiflexors. Goal status: NOT MET   2.  Pt will improve BERG by at least 3 points in order to demonstrate clinically significant improvement in balance.   Baseline: 04/22/23: To be completed on visit # 2.    04/24/23: 50/56       06/03/23: 52/56 Goal status: IN PROGRESS  3. Pt will decrease 5TSTS by at least 3 seconds in order to demonstrate clinically significant improvement in LE strength      Baseline: 04/22/23: 35.2 sec     06/03/23: 14.3 sec Goal status: ACHIEVED   4. Pt will improve DGI by at least 3 points in order to demonstrate clinically significant improvement in balance and decreased risk for falls.     Baseline: 04/22/23: To be completed on visit # 3.  04/29/23:  15/24    06/03/23: 18/24 Goal status: ACHIEVED   5. Pt will complete TUG in less than 11.5 sec indicative of decreased fall risk per clinical population Baseline: 04/22/23: 13.6 sec     06/03/23: 10.9 sec  Goal status: ACHIEVED    PLAN: PT FREQUENCY: 2x/week  PT DURATION: 4-6 weeks  PLANNED INTERVENTIONS: Therapeutic exercises, Therapeutic activity, Neuromuscular re-education, Balance training, Gait training, Patient/Family education, Joint manipulation, Joint mobilization, Canalith repositioning, Aquatic Therapy, Dry Needling, Cognitive remediation, Electrical stimulation, Spinal manipulation, Spinal mobilization, Cryotherapy, Moist heat, Traction, Ultrasound, Ionotophoresis 4mg /ml Dexamethasone, and Manual therapy  PLAN FOR NEXT SESSION: Continue with postural control training, LE coordination training for toe clearance and stable weight shift, LE strengthening. Large-amplitude movements for HEP.     Denese Finn, PT, DPT #[id number]  Aleatha Hunting 06/12/2023, 6:01 PM

## 2023-06-17 ENCOUNTER — Ambulatory Visit: Payer: 59 | Admitting: Physical Therapy

## 2023-06-17 DIAGNOSIS — R262 Difficulty in walking, not elsewhere classified: Secondary | ICD-10-CM

## 2023-06-17 DIAGNOSIS — R2689 Other abnormalities of gait and mobility: Secondary | ICD-10-CM

## 2023-06-17 DIAGNOSIS — R2681 Unsteadiness on feet: Secondary | ICD-10-CM

## 2023-06-17 NOTE — Therapy (Deleted)
 OUTPATIENT PHYSICAL THERAPY TREATMENT   Patient Name: Tyler Glover MRN: [id number] DOB:31-Jan-1961, 63 y.o., male Today's Date: 06/17/2023      Past Medical History:  Diagnosis Date   Allergy    Arthritis    Rheumatoid   GERD (gastroesophageal reflux disease)    Hypertension    Past Surgical History:  Procedure Laterality Date   COLONOSCOPY     COLONOSCOPY WITH PROPOFOL  N/A 08/21/2019   Procedure: COLONOSCOPY WITH PROPOFOL ;  Surgeon: Marnee Sink, MD;  Location: [facility];  Service: Endoscopy;  Laterality: N/A;  priority 4   ESOPHAGOGASTRODUODENOSCOPY (EGD) WITH PROPOFOL  N/A 08/21/2019   Procedure: ESOPHAGOGASTRODUODENOSCOPY (EGD) WITH PROPOFOL ;  Surgeon: Marnee Sink, MD;  Location: [facility];  Service: Endoscopy;  Laterality: N/A;   POLYPECTOMY  08/21/2019   Procedure: POLYPECTOMY;  Surgeon: Marnee Sink, MD;  Location[facility];  Service: Endoscopy;;   SHOULDER SURGERY     TESTICLE SURGERY     age 83 or 44   TONSILLECTOMY     Patient Active Problem List   Diagnosis Date Noted   Encounter for screening colonoscopy    Polyp of transverse colon    Heartburn    Pure hypercholesterolemia 01/26/2016   Right lateral epicondylitis 07/07/2015   Vitamin D  deficiency 12/24/2014   Prediabetes 12/24/2014   Allergic rhinitis, seasonal 06/03/2014   Acid reflux 06/03/2014   Essential (primary) hypertension 06/03/2014   Family history of diabetes mellitus 06/03/2014   Family history of cardiac disorder 06/03/2014   Obesity (BMI 30.0-34.9) 06/03/2014    PCP: Clarise Crooks, MD  REFERRING PROVIDER: Aurther Lefevre, MD  REFERRING DIAGNOSIS:  G20.A1 (ICD-10-CM) - Parkinson's disease (HCC)    THERAPY DIAG: Difficulty in walking, not elsewhere classified  Unsteadiness on feet  Other abnormalities of gait and mobility  ONSET DATE: 02/2023  FOLLOW UP APPT WITH PROVIDER: Yes, f/u with referring provider in July 2025   RATIONALE FOR EVALUATION  AND TREATMENT: Rehabilitation  Pertinent History Patient is a 63 year old male referred for gait/mobility deficits secondary to Parkinson's disease. Using amantadine for PD symptoms. Patient reports L side of his body is getting weaker. He reports he wants to be able to increase his activity. Pt reports comorbid low back pain and hip pain. Patient reports responding relatively well with monthly infusions of RA. Pt reports his LLE tends to drag - concerned for it catching on carpet.   Pain: Yes; multi-site associated with RA; low back and comorbid L knee pain.  Numbness/Tingling: No Focal Weakness: Yes; L-sided weakness, LE worse than UE Recent changes in overall health/medication: No Prior history of physical therapy for balance:  Yes; previous PT in [facility] outpatient [facility]  Falls: Has patient fallen in last 6 months? No, Number of falls: N/A Directional pattern for falls: No Dominant hand: right Imaging: Yes ;  Normal brain MRI in 2023;   Prior level of function: Independent Occupational demands: IT trainer - M-F; p thas to hold batteries, alternators, starters Hobbies: Traveling  Red flags (bowel/bladder changes, saddle paresthesia, personal history of cancer, h/o spinal tumors, h/o compression fx, h/o abdominal aneurysm, abdominal pain, chills/fever, night sweats, nausea, vomiting, unrelenting pain): Negative  Precautions: None  Weight Bearing Restrictions: No  Living Environment Lives with: lives with their spouse and mother  Lives in: House/apartment; one step to get into home; stairs to get into home, pt has upstairs bedroom. Pt reports doing fairly well with stairs. Concrete from his car up to front steps  Has following equipment  at home: None   Patient Goals: Improved mobility in L leg and L arm, improved strength in low back.     OBJECTIVE:   Patient Surveys  ABC: 91.3%  Cognition Patient is oriented to person, place, and time.  Recent memory is  intact.  Remote memory is intact.  Attention span and concentration are intact.  Expressive speech is intact.  Patient's fund of knowledge is within normal limits for educational level.   Gross Musculoskeletal Assessment Tremor: No resting tremor  Bulk: Normal Tone: Cogwheel rigidity LE > UE. Hamstrings are spastic on L>R, Modified Ashworth 2 and 1, respectively.  GAIT: Distance walked: 80 ft Assistive device utilized: None Level of assistance: SBA Comments: Decreased arm swing, decreased trunk rotation with arm/leg swing, LLE decreased heel strike/mid-foot initial contact  Posture: Forward head, rounded shoulders   AROM  AROM (Normal range in degrees) AROM  04/22/2023  Lumbar   Flexion (65) 75%*  Extension (30) 50% no pain  Right lateral flexion (25) 75% (R-sided discomfort)  Left lateral flexion (25) 75% (R-sided discomfort)  Right rotation (30) 50%  Left rotation (30) 25%          Ankle    Dorsiflexion (20) WFL WFL  Plantarflexion (50)    Inversion (35)    Eversion (15    (* = pain; Blank rows = not tested)   LE MMT:  MMT (out of 5) Right 04/22/2023 Left 04/22/2023 Right 06/03/23 Left 06/03/23  Hip flexion 4- 3+ 5- 4-  Hip extension      Hip abduction (seated) 5 5 5 5   Hip adduction (seated) 5 5 5 5   Hip internal rotation      Hip external rotation      Knee flexion 5 4 5  4+  Knee extension 5 4 5 4   Ankle dorsiflexion 5 4- 5 4-  Ankle plantarflexion      Ankle inversion      Ankle eversion      (* = pain; Blank rows = not tested)   Coordination/Cerebellar Finger to Nose: L-sided hypometria  Heel to Shin: L-sided impairment  Rapid alternating movements: Impaired L>R, mild impairment LE toe tapping  Finger Opposition: WNL Pronator Drift: Positive LUE   FUNCTIONAL OUTCOME MEASURES (initial eval)   Results Comments  BERG 50/56 Fall risk, in need of intervention  DGI Next visit/24   TUG 13.6 seconds   5TSTS 35.2 seconds   (Blank rows = not  tested)   Postural Control Screen Romberg: EO WNL, EC up to 30 sec with significant sway and near-LOB x 2   Lumbar Spine Screen (04/24/23) SLUMP: Negative SLR: Negative Repeated extension in standing: increases, worse pain locally along low back    TODAY'S TREATMENT    SUBJECTIVE STATEMENT:   Patient reports back is better generally versus earlier this week. He reports difficulty sleeping due to his work. He reports some back soreness after last visit. Patient reports    Therapeutic Exercise - improved strength as needed to improve performance of CKC activities/functional movements and as needed for power production to prevent fall during episode of large postural perturbation   NuStep; Level 5, x 5 minutes - for improved soft tissue mobility and increased tissue temperature to improve muscle performance   -interval subjective gathered during this time   PATIENT EDUCATION: Discussed role of PT/rationale for exercises, prognosis   *not today* // bars: high knees with opposite knee touch, 5-lb ankle weights; 5x D/B   Neuromuscular Re-education - fall  risk screening/management; for improved sensory integration, static and dynamic postural control, equilibrium and non-equilibrium coordination as needed for negotiating home and community environment and stepping over obstacles   Exaggerated arm and leg swing with 4-lb ankle weights; // bars; 5x D/B length of bars    Hurdle stepping: (3) 6-inch hurdles and (2) 12-inch hurdles; 4x D/B   Sit to stand with 4-lb Med ball overhead reach, standing on Airex; 2x10   Obstacle course: alternating cone tap (4 cones on R and L); (3) 6-inch hurdles; Airex step up/down x 3 pads; 5x D/B  6-inch step + Airex forward step up; 1 x 15 with each LE  Forward step up to BOSU ball; x10 with each LE leading    Tandem stance, on Airex pad; multiple attempts - up to 20 sec on each side without notable LOB   *not today*  Toe tapping on single cone; 2  x 10 alternating R/L  Forward march along blue agility ladder, exaggerated arm and leg swing; 5x D/B  Toe tapping on 6-inch step; 2x10    PATIENT EDUCATION:  Education details: see above for patient education details Person educated: Patient Education method: Explanation Education comprehension: verbalized understanding   HOME EXERCISE PROGRAM: Access Code: [email or web address] URL: [email or web address] Date: 05/06/2023 Prepared by: Denese Finn  Exercises - Step Forward with Arms Reaching to Sides  - 2 x daily - 7 x weekly - 2 sets - 10 reps - Step Sideways with Arms Reaching  - 2 x daily - 7 x weekly - 2 sets - 10 reps - Sit to Stand with Arm Reach  - 2 x daily - 7 x weekly - 2 sets - 8 reps - Heel Toe Raises with Counter Support  - 2 x daily - 7 x weekly - 2 sets - 10 reps   Access Code: [email or web address] URL: [email or web address] Date: 04/24/2023 Prepared by: Denese Finn  Exercises - Supine Lower Trunk Rotation  - 2 x daily - 7 x weekly - 2 sets - 10 reps - Supine Piriformis Stretch with Foot on Ground  - 2 x daily - 7 x weekly - 3 sets - 30sec hold - Seated Hamstring Stretch  - 2 x daily - 7 x weekly - 3 sets - 30sec hold   ASSESSMENT:  CLINICAL IMPRESSION: Patient is able to significantly progress with higher-level postural control and dynamic balance drills, including stepping on uneven surfaces and completing Sharpened Romberg on Airex. Pt is improving with ability to clear LLE during obstacle negotiation and gait. Pt participates very well with PT, and he would benefit from continued work on restoration of L>RLE strength, fall risk management, and balance training. Patient will benefit from skilled PT to address above impairments and improve overall function.  REHAB POTENTIAL: Good  CLINICAL DECISION MAKING: Evolving/moderate complexity  EVALUATION COMPLEXITY: High   GOALS: Goals reviewed with patient? Yes  SHORT TERM GOALS: Target date:  05/16/2023  Pt will be independent with HEP in order to improve strength and balance in order to decrease fall risk and improve function at home. Baseline: 04/22/23: Formal HEP to be issued on visit # 2.   04/24/23: HEP given for thoracolumbar mobility, reviewed baseline exercises for LE strengthening/fall risk.    06/03/23: Pt is 50% compliant; he has difficulty completing on some days.  Goal status: ON-GOING   LONG TERM GOALS: Target date: 06/20/2023  Patient will improve MMT of tested LLE musculature to 4+/5 or greater indicative of improved strength as needed for power  production to prevent fall and to improve performance of transfers and stair negotiation.  Baseline: 04/22/23: LLE gross strength 3+ to 4 (exception of seated hip ABD/ADD 5/5, not tested in standard testing position).     06/03/23: Modest improvement in strength, no change in L quads or dorsiflexors. Goal status: NOT MET   2.  Pt will improve BERG by at least 3 points in order to demonstrate clinically significant improvement in balance.   Baseline: 04/22/23: To be completed on visit # 2.    04/24/23: 50/56       06/03/23: 52/56 Goal status: IN PROGRESS  3. Pt will decrease 5TSTS by at least 3 seconds in order to demonstrate clinically significant improvement in LE strength      Baseline: 04/22/23: 35.2 sec     06/03/23: 14.3 sec Goal status: ACHIEVED   4. Pt will improve DGI by at least 3 points in order to demonstrate clinically significant improvement in balance and decreased risk for falls.     Baseline: 04/22/23: To be completed on visit # 3.  04/29/23: 15/24    06/03/23: 18/24 Goal status: ACHIEVED   5. Pt will complete TUG in less than 11.5 sec indicative of decreased fall risk per clinical population Baseline: 04/22/23: 13.6 sec     06/03/23: 10.9 sec  Goal status: ACHIEVED    PLAN: PT FREQUENCY: 2x/week  PT DURATION: 4-6 weeks  PLANNED INTERVENTIONS: Therapeutic exercises, Therapeutic activity, Neuromuscular re-education,  Balance training, Gait training, Patient/Family education, Joint manipulation, Joint mobilization, Canalith repositioning, Aquatic Therapy, Dry Needling, Cognitive remediation, Electrical stimulation, Spinal manipulation, Spinal mobilization, Cryotherapy, Moist heat, Traction, Ultrasound, Ionotophoresis 4mg /ml Dexamethasone, and Manual therapy  PLAN FOR NEXT SESSION: Continue with postural control training, LE coordination training for toe clearance and stable weight shift, LE strengthening. Large-amplitude movements for HEP.     Denese Finn, PT, DPT #[id number]  Aleatha Hunting 06/17/2023, 12:42 PM

## 2023-06-19 ENCOUNTER — Encounter: Payer: Self-pay | Admitting: Physical Therapy

## 2023-06-19 ENCOUNTER — Ambulatory Visit: Payer: 59 | Attending: Physical Medicine & Rehabilitation | Admitting: Physical Therapy

## 2023-06-19 DIAGNOSIS — R262 Difficulty in walking, not elsewhere classified: Secondary | ICD-10-CM | POA: Diagnosis present

## 2023-06-19 DIAGNOSIS — R2689 Other abnormalities of gait and mobility: Secondary | ICD-10-CM | POA: Diagnosis present

## 2023-06-19 DIAGNOSIS — R2681 Unsteadiness on feet: Secondary | ICD-10-CM | POA: Insufficient documentation

## 2023-06-19 NOTE — Therapy (Unsigned)
 OUTPATIENT PHYSICAL THERAPY TREATMENT   Patient Name: Tyler Glover MRN: [id number] DOB:06/18/1960, 63 y.o., male Today's Date: 06/19/2023   PT End of Session - 06/19/23 1724     Visit Number 14    Number of Visits 17    Date for PT Re-Evaluation 06/20/23    PT Start Time 1720    PT Stop Time 1800    PT Time Calculation (min) 40 min    Equipment Utilized During Treatment Gait belt    Activity Tolerance Patient tolerated treatment well    Behavior During Therapy WFL for tasks assessed/performed              Past Medical History:  Diagnosis Date   Allergy    Arthritis    Rheumatoid   GERD (gastroesophageal reflux disease)    Hypertension    Past Surgical History:  Procedure Laterality Date   COLONOSCOPY     COLONOSCOPY WITH PROPOFOL  N/A 08/21/2019   Procedure: COLONOSCOPY WITH PROPOFOL ;  Surgeon: Marnee Sink, MD;  Location: [facility];  Service: Endoscopy;  Laterality: N/A;  priority 4   ESOPHAGOGASTRODUODENOSCOPY (EGD) WITH PROPOFOL  N/A 08/21/2019   Procedure: ESOPHAGOGASTRODUODENOSCOPY (EGD) WITH PROPOFOL ;  Surgeon: Marnee Sink, MD;  Location: [facility];  Service: Endoscopy;  Laterality: N/A;   POLYPECTOMY  08/21/2019   Procedure: POLYPECTOMY;  Surgeon: Marnee Sink, MD;  Location: [facility];  Service: Endoscopy;;   SHOULDER SURGERY     TESTICLE SURGERY     age 25 or 59   TONSILLECTOMY     Patient Active Problem List   Diagnosis Date Noted   Encounter for screening colonoscopy    Polyp of transverse colon    Heartburn    Pure hypercholesterolemia 01/26/2016   Right lateral epicondylitis 07/07/2015   Vitamin D  deficiency 12/24/2014   Prediabetes 12/24/2014   Allergic rhinitis, seasonal 06/03/2014   Acid reflux 06/03/2014   Essential (primary) hypertension 06/03/2014   Family history of diabetes mellitus 06/03/2014   Family history of cardiac disorder 06/03/2014   Obesity (BMI 30.0-34.9) 06/03/2014    PCP: Clarise Crooks,  MD  REFERRING PROVIDER: Aurther Lefevre, MD  REFERRING DIAGNOSIS:  G20.A1 (ICD-10-CM) - Parkinson's disease (HCC)    THERAPY DIAG: Difficulty in walking, not elsewhere classified  Unsteadiness on feet  Other abnormalities of gait and mobility  ONSET DATE: 02/2023  FOLLOW UP APPT WITH PROVIDER: Yes, f/u with referring provider in July 2025   RATIONALE FOR EVALUATION AND TREATMENT: Rehabilitation  Pertinent History Patient is a 63 year old male referred for gait/mobility deficits secondary to Parkinson's disease. Using amantadine for PD symptoms. Patient reports L side of his body is getting weaker. He reports he wants to be able to increase his activity. Pt reports comorbid low back pain and hip pain. Patient reports responding relatively well with monthly infusions of RA. Pt reports his LLE tends to drag - concerned for it catching on carpet.   Pain: Yes; multi-site associated with RA; low back and comorbid L knee pain.  Numbness/Tingling: No Focal Weakness: Yes; L-sided weakness, LE worse than UE Recent changes in overall health/medication: No Prior history of physical therapy for balance:  Yes; previous PT in [facility] outpatient clinic  Falls: Has patient fallen in last 6 months? No, Number of falls: N/A Directional pattern for falls: No Dominant hand: right Imaging: Yes ;  Normal brain MRI in 2023;   Prior level of function: Independent Occupational demands: IT trainer - M-F; p thas to hold batteries, alternators,  starters Hobbies: Traveling  Red flags (bowel/bladder changes, saddle paresthesia, personal history of cancer, h/o spinal tumors, h/o compression fx, h/o abdominal aneurysm, abdominal pain, chills/fever, night sweats, nausea, vomiting, unrelenting pain): Negative  Precautions: None  Weight Bearing Restrictions: No  Living Environment Lives with: lives with their spouse and mother  Lives in: House/apartment; one step to get into home; stairs to get  into home, pt has upstairs bedroom. Pt reports doing fairly well with stairs. Concrete from his car up to front steps  Has following equipment at home: None   Patient Goals: Improved mobility in L leg and L arm, improved strength in low back.     OBJECTIVE:   Patient Surveys  ABC: 91.3%  Cognition Patient is oriented to person, place, and time.  Recent memory is intact.  Remote memory is intact.  Attention span and concentration are intact.  Expressive speech is intact.  Patient's fund of knowledge is within normal limits for educational level.   Gross Musculoskeletal Assessment Tremor: No resting tremor  Bulk: Normal Tone: Cogwheel rigidity LE > UE. Hamstrings are spastic on L>R, Modified Ashworth 2 and 1, respectively.  GAIT: Distance walked: 80 ft Assistive device utilized: None Level of assistance: SBA Comments: Decreased arm swing, decreased trunk rotation with arm/leg swing, LLE decreased heel strike/mid-foot initial contact  Posture: Forward head, rounded shoulders   AROM  AROM (Normal range in degrees) AROM  04/22/2023  Lumbar   Flexion (65) 75%*  Extension (30) 50% no pain  Right lateral flexion (25) 75% (R-sided discomfort)  Left lateral flexion (25) 75% (R-sided discomfort)  Right rotation (30) 50%  Left rotation (30) 25%          Ankle    Dorsiflexion (20) WFL WFL  Plantarflexion (50)    Inversion (35)    Eversion (15    (* = pain; Blank rows = not tested)   LE MMT:  MMT (out of 5) Right 04/22/2023 Left 04/22/2023 Right 06/03/23 Left 06/03/23  Hip flexion 4- 3+ 5- 4-  Hip extension      Hip abduction (seated) 5 5 5 5   Hip adduction (seated) 5 5 5 5   Hip internal rotation      Hip external rotation      Knee flexion 5 4 5  4+  Knee extension 5 4 5 4   Ankle dorsiflexion 5 4- 5 4-  Ankle plantarflexion      Ankle inversion      Ankle eversion      (* = pain; Blank rows = not tested)   Coordination/Cerebellar Finger to Nose: L-sided  hypometria  Heel to Shin: L-sided impairment  Rapid alternating movements: Impaired L>R, mild impairment LE toe tapping  Finger Opposition: WNL Pronator Drift: Positive LUE   FUNCTIONAL OUTCOME MEASURES (initial eval)   Results Comments  BERG 50/56 Fall risk, in need of intervention  DGI Next visit/24   TUG 13.6 seconds   5TSTS 35.2 seconds   (Blank rows = not tested)   Postural Control Screen Romberg: EO WNL, EC up to 30 sec with significant sway and near-LOB x 2   Lumbar Spine Screen (04/24/23) SLUMP: Negative SLR: Negative Repeated extension in standing: increases, worse pain locally along low back    TODAY'S TREATMENT    SUBJECTIVE STATEMENT:   Patient reports some fatigue after his infusion yesterday. Patient reports more fatigue recently and being more tired overall since his Parkinson's diagnosis.     Therapeutic Exercise - improved strength as needed to  improve performance of CKC activities/functional movements and as needed for power production to prevent fall during episode of large postural perturbation   NuStep; Level 5, x 5 minutes - for improved soft tissue mobility and increased tissue temperature to improve muscle performance   -interval subjective gathered during this time   PATIENT EDUCATION: Discussed role of PT/rationale for exercises, prognosis   *not today* // bars: high knees with opposite knee touch, 5-lb ankle weights; 5x D/B   Neuromuscular Re-education - fall risk screening/management; for improved sensory integration, static and dynamic postural control, equilibrium and non-equilibrium coordination as needed for negotiating home and community environment and stepping over obstacles   Exaggerated arm and leg swing with 4-lb ankle weights; // bars; 5x D/B length of bars    Forward step up to hurdle stance, 6-inch step (staircase, center of gym); 2 x 10  6-inch step + Airex forward step up; 1 x 15 with each LE  Forward step up to BOSU  ball; x10 with each LE leading    Tandem stance, on Airex pad; multiple attempts - up to 20 sec on each side without notable LOB    *next visit*  Hurdle stepping: (3) 6-inch hurdles and (2) 12-inch hurdles; 4x D/B   *not today*  Obstacle course: alternating cone tap (4 cones on R and L); (3) 6-inch hurdles; Airex step up/down x 3 pads; 5x D/B  Sit to stand with 4-lb Med ball overhead reach, standing on Airex; 2x10  Toe tapping on single cone; 2 x 10 alternating R/L  Forward march along blue agility ladder, exaggerated arm and leg swing; 5x D/B  Toe tapping on 6-inch step; 2x10    PATIENT EDUCATION:  Education details: see above for patient education details Person educated: Patient Education method: Explanation Education comprehension: verbalized understanding   HOME EXERCISE PROGRAM: Access Code: [email or web address] URL: [email or web address] Date: 05/06/2023 Prepared by: Denese Finn  Exercises - Step Forward with Arms Reaching to Sides  - 2 x daily - 7 x weekly - 2 sets - 10 reps - Step Sideways with Arms Reaching  - 2 x daily - 7 x weekly - 2 sets - 10 reps - Sit to Stand with Arm Reach  - 2 x daily - 7 x weekly - 2 sets - 8 reps - Heel Toe Raises with Counter Support  - 2 x daily - 7 x weekly - 2 sets - 10 reps   Access Code: [email or web address] URL: [email or web address] Date: 04/24/2023 Prepared by: Denese Finn  Exercises - Supine Lower Trunk Rotation  - 2 x daily - 7 x weekly - 2 sets - 10 reps - Supine Piriformis Stretch with Foot on Ground  - 2 x daily - 7 x weekly - 3 sets - 30sec hold - Seated Hamstring Stretch  - 2 x daily - 7 x weekly - 3 sets - 30sec hold   ASSESSMENT:  CLINICAL IMPRESSION: Patient exhibits markedly improved L foot clearance and ability to clear obstacles. He is notably challenged with BOSU step up, but he otherwise is able to maintain postural control well with other step-up tasks performed. Pt participates very well  with PT, and he would benefit from continued work on restoration of L>RLE strength, fall risk management, and balance training. Patient will benefit from skilled PT to address above impairments and improve overall function.  REHAB POTENTIAL: Good  CLINICAL DECISION MAKING: Evolving/moderate complexity  EVALUATION COMPLEXITY: High   GOALS: Goals reviewed with patient? Yes  SHORT TERM GOALS: Target  date: 05/16/2023  Pt will be independent with HEP in order to improve strength and balance in order to decrease fall risk and improve function at home. Baseline: 04/22/23: Formal HEP to be issued on visit # 2.   04/24/23: HEP given for thoracolumbar mobility, reviewed baseline exercises for LE strengthening/fall risk.    06/03/23: Pt is 50% compliant; he has difficulty completing on some days.  Goal status: ON-GOING   LONG TERM GOALS: Target date: 06/20/2023  Patient will improve MMT of tested LLE musculature to 4+/5 or greater indicative of improved strength as needed for power production to prevent fall and to improve performance of transfers and stair negotiation.  Baseline: 04/22/23: LLE gross strength 3+ to 4 (exception of seated hip ABD/ADD 5/5, not tested in standard testing position).     06/03/23: Modest improvement in strength, no change in L quads or dorsiflexors. Goal status: NOT MET   2.  Pt will improve BERG by at least 3 points in order to demonstrate clinically significant improvement in balance.   Baseline: 04/22/23: To be completed on visit # 2.    04/24/23: 50/56       06/03/23: 52/56 Goal status: IN PROGRESS  3. Pt will decrease 5TSTS by at least 3 seconds in order to demonstrate clinically significant improvement in LE strength      Baseline: 04/22/23: 35.2 sec     06/03/23: 14.3 sec Goal status: ACHIEVED   4. Pt will improve DGI by at least 3 points in order to demonstrate clinically significant improvement in balance and decreased risk for falls.     Baseline: 04/22/23: To be  completed on visit # 3.  04/29/23: 15/24    06/03/23: 18/24 Goal status: ACHIEVED   5. Pt will complete TUG in less than 11.5 sec indicative of decreased fall risk per clinical population Baseline: 04/22/23: 13.6 sec     06/03/23: 10.9 sec  Goal status: ACHIEVED    PLAN: PT FREQUENCY: 2x/week  PT DURATION: 4-6 weeks  PLANNED INTERVENTIONS: Therapeutic exercises, Therapeutic activity, Neuromuscular re-education, Balance training, Gait training, Patient/Family education, Joint manipulation, Joint mobilization, Canalith repositioning, Aquatic Therapy, Dry Needling, Cognitive remediation, Electrical stimulation, Spinal manipulation, Spinal mobilization, Cryotherapy, Moist heat, Traction, Ultrasound, Ionotophoresis 4mg /ml Dexamethasone, and Manual therapy  PLAN FOR NEXT SESSION: Continue with postural control training, LE coordination training for toe clearance and stable weight shift, LE strengthening. Large-amplitude movements for HEP.     Denese Finn, PT, DPT #[id number]  Aleatha Hunting 06/19/2023, 5:24 PM

## 2023-06-24 ENCOUNTER — Ambulatory Visit (INDEPENDENT_AMBULATORY_CARE_PROVIDER_SITE_OTHER): Admitting: Family Medicine

## 2023-06-24 ENCOUNTER — Ambulatory Visit: Admitting: Physical Therapy

## 2023-06-24 ENCOUNTER — Encounter: Payer: Self-pay | Admitting: Family Medicine

## 2023-06-24 ENCOUNTER — Encounter: Payer: Self-pay | Admitting: Physical Therapy

## 2023-06-24 VITALS — BP 136/86 | HR 83 | Ht 70.0 in | Wt 224.0 lb

## 2023-06-24 DIAGNOSIS — I10 Essential (primary) hypertension: Secondary | ICD-10-CM

## 2023-06-24 DIAGNOSIS — J301 Allergic rhinitis due to pollen: Secondary | ICD-10-CM

## 2023-06-24 DIAGNOSIS — H6993 Unspecified Eustachian tube disorder, bilateral: Secondary | ICD-10-CM

## 2023-06-24 DIAGNOSIS — R2689 Other abnormalities of gait and mobility: Secondary | ICD-10-CM

## 2023-06-24 DIAGNOSIS — K219 Gastro-esophageal reflux disease without esophagitis: Secondary | ICD-10-CM

## 2023-06-24 DIAGNOSIS — R7989 Other specified abnormal findings of blood chemistry: Secondary | ICD-10-CM

## 2023-06-24 DIAGNOSIS — R262 Difficulty in walking, not elsewhere classified: Secondary | ICD-10-CM | POA: Diagnosis not present

## 2023-06-24 DIAGNOSIS — E7800 Pure hypercholesterolemia, unspecified: Secondary | ICD-10-CM

## 2023-06-24 DIAGNOSIS — R2681 Unsteadiness on feet: Secondary | ICD-10-CM

## 2023-06-24 DIAGNOSIS — N529 Male erectile dysfunction, unspecified: Secondary | ICD-10-CM

## 2023-06-24 MED ORDER — ATORVASTATIN CALCIUM 10 MG PO TABS: ORAL_TABLET | ORAL | 5 refills | Status: DC

## 2023-06-24 MED ORDER — FLUTICASONE PROPIONATE 50 MCG/ACT NA SUSP: NASAL | 0 refills | Status: DC

## 2023-06-24 MED ORDER — PSEUDOEPHEDRINE HCL ER 120 MG PO TB12: ORAL_TABLET | ORAL | 11 refills | Status: DC

## 2023-06-24 MED ORDER — PANTOPRAZOLE SODIUM 40 MG PO TBEC: DELAYED_RELEASE_TABLET | ORAL | 5 refills | Status: DC

## 2023-06-24 MED ORDER — TESTOSTERONE 20.25 MG/ACT (1.62%) TD GEL: 1.0000 | Freq: Every day | TRANSDERMAL | 5 refills | Status: DC

## 2023-06-24 MED ORDER — MONTELUKAST SODIUM 10 MG PO TABS: ORAL_TABLET | ORAL | 5 refills | Status: DC

## 2023-06-24 MED ORDER — LISINOPRIL 40 MG PO TABS: ORAL_TABLET | ORAL | 5 refills | Status: DC

## 2023-06-24 MED ORDER — TADALAFIL 20 MG PO TABS: ORAL_TABLET | ORAL | 5 refills | Status: DC

## 2023-06-24 NOTE — Patient Instructions (Signed)

## 2023-06-24 NOTE — Therapy (Unsigned)
 OUTPATIENT PHYSICAL THERAPY TREATMENT   Patient Name: Tyler Glover MRN: [id number] DOB:[birth year], 63 y.o., male Today's Date: 06/24/2023   PT End of Session - 06/24/23 1718     Visit Number 15    Number of Visits 17    Date for PT Re-Evaluation 06/20/23    PT Start Time 1720    PT Stop Time 1802    PT Time Calculation (min) 42 min    Equipment Utilized During Treatment Gait belt    Activity Tolerance Patient tolerated treatment well    Behavior During Therapy WFL for tasks assessed/performed             Past Medical History:  Diagnosis Date   Allergy    Arthritis    Rheumatoid   GERD (gastroesophageal reflux disease)    Hypertension    Past Surgical History:  Procedure Laterality Date   COLONOSCOPY     COLONOSCOPY WITH PROPOFOL  N/A 08/21/2019   Procedure: COLONOSCOPY WITH PROPOFOL ;  Surgeon: Marnee Sink, MD;  Location: [facility];  Service: Endoscopy;  Laterality: N/A;  priority 4   ESOPHAGOGASTRODUODENOSCOPY (EGD) WITH PROPOFOL  N/A 08/21/2019   Procedure: ESOPHAGOGASTRODUODENOSCOPY (EGD) WITH PROPOFOL ;  Surgeon: Marnee Sink, MD;  Location: [facility];  Service: Endoscopy;  Laterality: N/A;   POLYPECTOMY  08/21/2019   Procedure: POLYPECTOMY;  Surgeon: Marnee Sink, MD;  Location: [facility];  Service: Endoscopy;;   SHOULDER SURGERY     TESTICLE SURGERY     age 6 or 41   TONSILLECTOMY     Patient Active Problem List   Diagnosis Date Noted   Encounter for screening colonoscopy    Polyp of transverse colon    Heartburn    Pure hypercholesterolemia 01/26/2016   Right lateral epicondylitis 07/07/2015   Vitamin D  deficiency 12/24/2014   Prediabetes 12/24/2014   Allergic rhinitis, seasonal 06/03/2014   Acid reflux 06/03/2014   Essential (primary) hypertension 06/03/2014   Family history of diabetes mellitus 06/03/2014   Family history of cardiac disorder 06/03/2014   Obesity (BMI 30.0-34.9) 06/03/2014    PCP: Clarise Crooks,  MD  REFERRING PROVIDER: Aurther Lefevre, MD  REFERRING DIAGNOSIS:  G20.A1 (ICD-10-CM) - Parkinson's disease (HCC)    THERAPY DIAG: Difficulty in walking, not elsewhere classified  Unsteadiness on feet  Other abnormalities of gait and mobility  ONSET DATE: 02/2023  FOLLOW UP APPT WITH PROVIDER: Yes, f/u with referring provider in July 2025   RATIONALE FOR EVALUATION AND TREATMENT: Rehabilitation  Pertinent History Patient is a 63 year old male referred for gait/mobility deficits secondary to Parkinson's disease. Using amantadine for PD symptoms. Patient reports L side of his body is getting weaker. He reports he wants to be able to increase his activity. Pt reports comorbid low back pain and hip pain. Patient reports responding relatively well with monthly infusions of RA. Pt reports his LLE tends to drag - concerned for it catching on carpet.   Pain: Yes; multi-site associated with RA; low back and comorbid L knee pain.  Numbness/Tingling: No Focal Weakness: Yes; L-sided weakness, LE worse than UE Recent changes in overall health/medication: No Prior history of physical therapy for balance:  Yes; previous PT in [facility] outpatient clinic  Falls: Has patient fallen in last 6 months? No, Number of falls: N/A Directional pattern for falls: No Dominant hand: right Imaging: Yes ;  Normal brain MRI in 2023;   Prior level of function: Independent Occupational demands: IT trainer - M-F; p thas to hold batteries, alternators, starters  Hobbies: Traveling  Red flags (bowel/bladder changes, saddle paresthesia, personal history of cancer, h/o spinal tumors, h/o compression fx, h/o abdominal aneurysm, abdominal pain, chills/fever, night sweats, nausea, vomiting, unrelenting pain): Negative  Precautions: None  Weight Bearing Restrictions: No  Living Environment Lives with: lives with their spouse and mother  Lives in: House/apartment; one step to get into home; stairs to get  into home, pt has upstairs bedroom. Pt reports doing fairly well with stairs. Concrete from his car up to front steps  Has following equipment at home: None   Patient Goals: Improved mobility in L leg and L arm, improved strength in low back.     OBJECTIVE:   Patient Surveys  ABC: 91.3%  Cognition Patient is oriented to person, place, and time.  Recent memory is intact.  Remote memory is intact.  Attention span and concentration are intact.  Expressive speech is intact.  Patient's fund of knowledge is within normal limits for educational level.   Gross Musculoskeletal Assessment Tremor: No resting tremor  Bulk: Normal Tone: Cogwheel rigidity LE > UE. Hamstrings are spastic on L>R, Modified Ashworth 2 and 1, respectively.  GAIT: Distance walked: 80 ft Assistive device utilized: None Level of assistance: SBA Comments: Decreased arm swing, decreased trunk rotation with arm/leg swing, LLE decreased heel strike/mid-foot initial contact  Posture: Forward head, rounded shoulders   AROM  AROM (Normal range in degrees) AROM  04/22/2023  Lumbar   Flexion (65) 75%*  Extension (30) 50% no pain  Right lateral flexion (25) 75% (R-sided discomfort)  Left lateral flexion (25) 75% (R-sided discomfort)  Right rotation (30) 50%  Left rotation (30) 25%          Ankle    Dorsiflexion (20) WFL WFL  Plantarflexion (50)    Inversion (35)    Eversion (15    (* = pain; Blank rows = not tested)   LE MMT:  MMT (out of 5) Right 04/22/2023 Left 04/22/2023 Right 06/03/23 Left 06/03/23  Hip flexion 4- 3+ 5- 4-  Hip extension      Hip abduction (seated) 5 5 5 5   Hip adduction (seated) 5 5 5 5   Hip internal rotation      Hip external rotation      Knee flexion 5 4 5  4+  Knee extension 5 4 5 4   Ankle dorsiflexion 5 4- 5 4-  Ankle plantarflexion      Ankle inversion      Ankle eversion      (* = pain; Blank rows = not tested)   Coordination/Cerebellar Finger to Nose: L-sided  hypometria  Heel to Shin: L-sided impairment  Rapid alternating movements: Impaired L>R, mild impairment LE toe tapping  Finger Opposition: WNL Pronator Drift: Positive LUE   FUNCTIONAL OUTCOME MEASURES (initial eval)   Results Comments  BERG 50/56 Fall risk, in need of intervention  DGI Next visit/24   TUG 13.6 seconds   5TSTS 35.2 seconds   (Blank rows = not tested)   Postural Control Screen Romberg: EO WNL, EC up to 30 sec with significant sway and near-LOB x 2   Lumbar Spine Screen (04/24/23) SLUMP: Negative SLR: Negative Repeated extension in standing: increases, worse pain locally along low back    TODAY'S TREATMENT    SUBJECTIVE STATEMENT:   Patient reports comorbid back pain for which he followed up with rheumatology - will be referred to spine specialist. Patient reports some episodes of dragging LLE. Pt reports being more conscientious of lifting LLE. Pt  feels that his back pain is progressively worsening. He states that he may be getting referral for PT focused on lumbar spine. His back pain is largely R lower lumbar paraspinal region.    Therapeutic Exercise - improved strength as needed to improve performance of CKC activities/functional movements and as needed for power production to prevent fall during episode of large postural perturbation   NuStep; Level 6, x 5 minutes - for improved soft tissue mobility and increased tissue temperature to improve muscle performance   -interval subjective gathered during this time  -cold pack on low back during time on NuStep (not billed)   PATIENT EDUCATION: Discussed progress with PT and intention for completing goal update next visit; discussed transition to focus on lumbar spine when clinic receives new referral   *not today* // bars: high knees with opposite knee touch, 5-lb ankle weights; 5x D/B   Neuromuscular Re-education - fall risk screening/management; for improved sensory integration, static and dynamic  postural control, equilibrium and non-equilibrium coordination as needed for negotiating home and community environment and stepping over obstacles   Exaggerated arm and leg swing with 4-lb ankle weights; // bars; 5x D/B length of bars    Forward step up to hurdle stance, 6-inch step (staircase, center of gym); 2 x 10   Hurdle stepping: (3) 6-inch hurdles and (2) 12-inch hurdles; 4x D/B  6-inch step + Airex forward step up; 1 x 15 with each LE  Forward step up to BOSU ball; x10 with each LLE leading; x 5 with RLE leading  -pain with unilateral loading on R side        *not today* Tandem stance, on Airex pad; multiple attempts - up to 20 sec on each side without notable LOB  Obstacle course: alternating cone tap (4 cones on R and L); (3) 6-inch hurdles; Airex step up/down x 3 pads; 5x D/B  Sit to stand with 4-lb Med ball overhead reach, standing on Airex; 2x10  Toe tapping on single cone; 2 x 10 alternating R/L  Forward march along blue agility ladder, exaggerated arm and leg swing; 5x D/B  Toe tapping on 6-inch step; 2x10    PATIENT EDUCATION:  Education details: see above for patient education details Person educated: Patient Education method: Explanation Education comprehension: verbalized understanding   HOME EXERCISE PROGRAM: Access Code: [email or web address] URL: [email or web address] Date: 05/06/2023 Prepared by: Denese Finn  Exercises - Step Forward with Arms Reaching to Sides  - 2 x daily - 7 x weekly - 2 sets - 10 reps - Step Sideways with Arms Reaching  - 2 x daily - 7 x weekly - 2 sets - 10 reps - Sit to Stand with Arm Reach  - 2 x daily - 7 x weekly - 2 sets - 8 reps - Heel Toe Raises with Counter Support  - 2 x daily - 7 x weekly - 2 sets - 10 reps   Access Code: [email or web address] URL: [email or web address] Date: 04/24/2023 Prepared by: Denese Finn  Exercises - Supine Lower Trunk Rotation  - 2 x daily - 7 x weekly - 2 sets - 10 reps -  Supine Piriformis Stretch with Foot on Ground  - 2 x daily - 7 x weekly - 3 sets - 30sec hold - Seated Hamstring Stretch  - 2 x daily - 7 x weekly - 3 sets - 30sec hold   ASSESSMENT:  CLINICAL IMPRESSION: Patient has made good progress to date with LLE clearance and improved postural control. He reports that  some weakness in L side and impaired movement control is his "new normal." Pt likely will have to utilize large-amplitude movement strategies for offset bradykinesia and impaired initiation of gait/transfers due to Parkinson's disease. Pt has significant comorbid back pain for which we reviewed home exercises early in this episode of care. We may transition to focus on targeted manual therapy/exercise for low back if new referral is received from spine clinic (pt recently referred to spine specialist).  Patient will benefit from skilled PT to address above impairments and improve overall function.  REHAB POTENTIAL: Good  CLINICAL DECISION MAKING: Evolving/moderate complexity  EVALUATION COMPLEXITY: High   GOALS: Goals reviewed with patient? Yes  SHORT TERM GOALS: Target date: 05/16/2023  Pt will be independent with HEP in order to improve strength and balance in order to decrease fall risk and improve function at home. Baseline: 04/22/23: Formal HEP to be issued on visit # 2.   04/24/23: HEP given for thoracolumbar mobility, reviewed baseline exercises for LE strengthening/fall risk.    06/03/23: Pt is 50% compliant; he has difficulty completing on some days.  Goal status: ON-GOING   LONG TERM GOALS: Target date: 06/20/2023  Patient will improve MMT of tested LLE musculature to 4+/5 or greater indicative of improved strength as needed for power production to prevent fall and to improve performance of transfers and stair negotiation.  Baseline: 04/22/23: LLE gross strength 3+ to 4 (exception of seated hip ABD/ADD 5/5, not tested in standard testing position).     06/03/23: Modest improvement  in strength, no change in L quads or dorsiflexors. Goal status: NOT MET   2.  Pt will improve BERG by at least 3 points in order to demonstrate clinically significant improvement in balance.   Baseline: 04/22/23: To be completed on visit # 2.    04/24/23: 50/56       06/03/23: 52/56 Goal status: IN PROGRESS  3. Pt will decrease 5TSTS by at least 3 seconds in order to demonstrate clinically significant improvement in LE strength      Baseline: 04/22/23: 35.2 sec     06/03/23: 14.3 sec Goal status: ACHIEVED   4. Pt will improve DGI by at least 3 points in order to demonstrate clinically significant improvement in balance and decreased risk for falls.     Baseline: 04/22/23: To be completed on visit # 3.  04/29/23: 15/24    06/03/23: 18/24 Goal status: ACHIEVED   5. Pt will complete TUG in less than 11.5 sec indicative of decreased fall risk per clinical population Baseline: 04/22/23: 13.6 sec     06/03/23: 10.9 sec  Goal status: ACHIEVED    PLAN: PT FREQUENCY: 2x/week  PT DURATION: 4-6 weeks  PLANNED INTERVENTIONS: Therapeutic exercises, Therapeutic activity, Neuromuscular re-education, Balance training, Gait training, Patient/Family education, Joint manipulation, Joint mobilization, Canalith repositioning, Aquatic Therapy, Dry Needling, Cognitive remediation, Electrical stimulation, Spinal manipulation, Spinal mobilization, Cryotherapy, Moist heat, Traction, Ultrasound, Ionotophoresis 4mg /ml Dexamethasone, and Manual therapy  PLAN FOR NEXT SESSION: Continue with postural control training, LE coordination training for toe clearance and stable weight shift, LE strengthening. Large-amplitude movements for HEP. Re-assessment next visit.     Denese Finn, PT, DPT #[id number]  Aleatha Hunting 06/25/2023, 8:41 AM

## 2023-06-24 NOTE — Progress Notes (Signed)
 Date:  06/24/2023   Name:  Tyler Glover   DOB:  [birth year]   MRN:  [id number]   Chief Complaint: Hypertension  Patient is a 63 year old male who presents for a comprehensive physical exam. The patient reports the following problems: hypotestosterone. Health maintenance has been reviewed up to date    Hypertension This is a chronic problem. The current episode started more than 1 year ago. The problem has been gradually improving since onset. The problem is controlled. Pertinent negatives include no chest pain, headaches, neck pain, palpitations or shortness of breath. Past treatments include ACE inhibitors. There are no compliance problems.  There is no history of CAD/MI or CVA. There is no history of chronic renal disease, a hypertension causing med or renovascular disease.  Hyperlipidemia This is a chronic problem. The current episode started more than 1 year ago. The problem is controlled. He has no history of chronic renal disease. Pertinent negatives include no chest pain, myalgias or shortness of breath. Current antihyperlipidemic treatment includes statins and diet change. The current treatment provides moderate improvement of lipids.  Gastroesophageal Reflux He reports no abdominal pain, no belching, no chest pain, no coughing, no dysphagia, no heartburn, no nausea, no sore throat or no wheezing. This is a chronic problem. The current episode started more than 1 year ago. The problem has been gradually improving. Nothing aggravates the symptoms. Pertinent negatives include no anemia, fatigue, melena (60), muscle weakness, orthopnea or weight loss. He has tried a PPI for the symptoms. The treatment provided moderate relief.  URI  This is a chronic (allergic rhinitis) problem. The problem has been waxing and waning. Associated symptoms include congestion, rhinorrhea and sneezing. Pertinent negatives include no abdominal pain, chest pain, coughing, diarrhea, dysuria, ear pain,  headaches, nausea, neck pain, rash, sore throat or wheezing. Treatments tried: nasal steroid/decongestant. The treatment provided moderate relief.    Lab Results  Component Value Date   NA 143 02/05/2023   K 4.0 02/05/2023   CO2 23 02/05/2023   GLUCOSE 78 02/05/2023   BUN 19 02/05/2023   CREATININE 0.87 02/05/2023   CALCIUM  9.2 02/05/2023   EGFR 99 02/05/2023   GFRNONAA 73 06/30/2019   Lab Results  Component Value Date   CHOL 173 02/05/2023   HDL 69 02/05/2023   LDLCALC 77 02/05/2023   TRIG 161 (H) 02/05/2023   CHOLHDL 4.6 08/22/2017   No results found for: "TSH" Lab Results  Component Value Date   HGBA1C 5.7 (H) 07/04/2022   No results found for: "WBC", "HGB", "HCT", "MCV", "PLT" Lab Results  Component Value Date   ALT 16 02/05/2023   AST 16 02/05/2023   ALKPHOS 92 02/05/2023   BILITOT 0.7 02/05/2023   Lab Results  Component Value Date   VD25OH 30.1 07/07/2015     Review of Systems  Constitutional:  Negative for chills, fatigue, fever and weight loss.  HENT:  Positive for congestion, postnasal drip, rhinorrhea and sneezing. Negative for drooling, ear discharge, ear pain, sinus pressure and sore throat.   Respiratory:  Negative for cough, shortness of breath and wheezing.   Cardiovascular:  Negative for chest pain, palpitations and leg swelling.  Gastrointestinal:  Negative for abdominal pain, blood in stool, constipation, diarrhea, dysphagia, heartburn, melena (60) and nausea.  Endocrine: Negative for polydipsia.  Genitourinary:  Negative for dysuria, frequency, hematuria and urgency.  Musculoskeletal:  Negative for back pain, myalgias, muscle weakness and neck pain.  Skin:  Negative for rash.  Allergic/Immunologic:  Negative for environmental allergies.  Neurological:  Negative for dizziness and headaches.  Hematological:  Does not bruise/bleed easily.  Psychiatric/Behavioral:  Negative for suicidal ideas. The patient is not nervous/anxious.     Patient  Active Problem List   Diagnosis Date Noted   Encounter for screening colonoscopy    Polyp of transverse colon    Heartburn    Pure hypercholesterolemia 01/26/2016   Right lateral epicondylitis 07/07/2015   Vitamin D  deficiency 12/24/2014   Prediabetes 12/24/2014   Allergic rhinitis, seasonal 06/03/2014   Acid reflux 06/03/2014   Essential (primary) hypertension 06/03/2014   Family history of diabetes mellitus 06/03/2014   Family history of cardiac disorder 06/03/2014   Obesity (BMI 30.0-34.9) 06/03/2014    Allergies  Allergen Reactions   Penicillins     Told as child not to do injectable, but has taken Amoxicillin since Unknown    Past Surgical History:  Procedure Laterality Date   COLONOSCOPY     COLONOSCOPY WITH PROPOFOL  N/A 08/21/2019   Procedure: COLONOSCOPY WITH PROPOFOL ;  Surgeon: Marnee Sink, MD;  Location: [facility];  Service: Endoscopy;  Laterality: N/A;  priority 4   ESOPHAGOGASTRODUODENOSCOPY (EGD) WITH PROPOFOL  N/A 08/21/2019   Procedure: ESOPHAGOGASTRODUODENOSCOPY (EGD) WITH PROPOFOL ;  Surgeon: Marnee Sink, MD;  Location: [facility];  Service: Endoscopy;  Laterality: N/A;   POLYPECTOMY  08/21/2019   Procedure: POLYPECTOMY;  Surgeon: Marnee Sink, MD;  Location: [facility];  Service: Endoscopy;;   SHOULDER SURGERY     TESTICLE SURGERY     age 39 or 82   TONSILLECTOMY      Social History   Tobacco Use   Smoking status: Former    Current packs/day: 0.00    Types: Cigarettes    Quit date: 1993    Years since quitting: 32.3   Smokeless tobacco: Never  Vaping Use   Vaping status: Never Used  Substance Use Topics   Alcohol use: Not Currently    Alcohol/week: 0.0 standard drinks of alcohol   Drug use: No     Medication list has been reviewed and updated.  Current Meds  Medication Sig   Amantadine HCl 100 MG tablet Take 50 mg by mouth 2 (two) times daily. Mason Sole   atorvastatin  (LIPITOR) 10 MG tablet TAKE 1 TABLET(10 MG) BY MOUTH  DAILY   calcium  carbonate (OS-CAL) 600 MG tablet Take 600 mg by mouth daily.   Cholecalciferol (VITAMIN D ) 2000 units CAPS Take 1 capsule (2,000 Units total) by mouth daily.   Cyanocobalamin 1000 MCG CAPS Take 1 capsule by mouth daily.   fluticasone  (FLONASE ) 50 MCG/ACT nasal spray SHAKE LIQUID AND USE 1 SPRAY IN EACH NOSTRIL DAILY   folic acid (FOLVITE) 1 MG tablet Take 1 tablet by mouth daily. patel   hydroxychloroquine (PLAQUENIL) 200 MG tablet Take 1 tablet by mouth 2 (two) times daily. Dr patel   lisinopril  (ZESTRIL ) 40 MG tablet TAKE 1 TABLET(40 MG) BY MOUTH DAILY   meloxicam  (MOBIC ) 7.5 MG tablet TAKE 1 TABLET(7.5 MG) BY MOUTH DAILY   methotrexate (RHEUMATREX) 2.5 MG tablet Take 7 tablets by mouth once a week. patel   montelukast  (SINGULAIR ) 10 MG tablet TAKE 1 TABLET(10 MG) BY MOUTH AT BEDTIME   Multiple Vitamin (MULTIVITAMIN) capsule Take 1 capsule by mouth daily.   OVER THE COUNTER MEDICATION daily. Total Brain Dietary Supplement   pantoprazole  (PROTONIX ) 40 MG tablet TAKE 1 TABLET(40 MG) BY MOUTH DAILY   pseudoephedrine  (WAL-PHED 12 HOUR) 120 MG 12 hr tablet TAKE  1 TABLET BY MOUTH EVERY 12 HOURS AS NEEDED FOR CONGESTION   tadalafil  (CIALIS ) 20 MG tablet TAKE  1/2-1 TABLET BY MOUTH EVERY OTHER DAY AS NEEDED FOR ERECTILE DYSFUNCTION   Testosterone  (ANDROGEL  PUMP) 20.25 MG/ACT (1.62%) GEL Place 1 Pump onto the skin daily. Apply 1 pump to the forearm daily   Turmeric (RA TURMERIC EXTRA STRENGTH) 1053 MG TABS Take by mouth.   vitamin E 180 MG (400 UNITS) capsule Take 400 Units by mouth daily.       06/24/2023    8:36 AM 02/04/2023    1:24 PM 07/04/2022   10:04 AM 01/03/2022   11:25 AM  GAD 7 : Generalized Anxiety Score  Nervous, Anxious, on Edge 0 0 0 0  Control/stop worrying 0 0 0 0  Worry too much - different things 0 0 0 0  Trouble relaxing 0 0 0 0  Restless 0 0 0 0  Easily annoyed or irritable 0 0 0 0  Afraid - awful might happen 0 0 0 0  Total GAD 7 Score 0 0 0 0  Anxiety  Difficulty Not difficult at all Not difficult at all Not difficult at all Not difficult at all       06/24/2023    8:35 AM 02/04/2023    1:24 PM 07/04/2022   10:04 AM  Depression screen PHQ 2/9  Decreased Interest 0 0 0  Down, Depressed, Hopeless 0 0 0  PHQ - 2 Score 0 0 0  Altered sleeping 0 0 0  Tired, decreased energy 0 0 0  Change in appetite 0 0 0  Feeling bad or failure about yourself  0 0 0  Trouble concentrating 0 0 0  Moving slowly or fidgety/restless 0 0 0  Suicidal thoughts 0 0 0  PHQ-9 Score 0 0 0  Difficult doing work/chores Not difficult at all Not difficult at all Not difficult at all    BP Readings from Last 3 Encounters:  06/24/23 136/86  02/04/23 120/78  07/04/22 128/78    Physical Exam Vitals and nursing note reviewed.  Constitutional:      Appearance: He is well-developed.  HENT:     Head: Normocephalic and atraumatic.     Right Ear: Tympanic membrane, ear canal and external ear normal.     Left Ear: Tympanic membrane, ear canal and external ear normal.     Nose: Nose normal.     Mouth/Throat:     Dentition: Normal dentition.  Eyes:     General: Lids are normal. No scleral icterus.    Conjunctiva/sclera: Conjunctivae normal.     Pupils: Pupils are equal, round, and reactive to light.  Neck:     Thyroid: No thyromegaly.     Vascular: No carotid bruit, hepatojugular reflux or JVD.     Trachea: No tracheal deviation.  Cardiovascular:     Rate and Rhythm: Normal rate and regular rhythm.     Heart sounds: Normal heart sounds.  Pulmonary:     Effort: Pulmonary effort is normal.     Breath sounds: Normal breath sounds.  Abdominal:     General: Bowel sounds are normal.     Palpations: Abdomen is soft. There is no hepatomegaly, splenomegaly or mass.     Tenderness: There is no abdominal tenderness.     Hernia: There is no hernia in the left inguinal area.  Musculoskeletal:        General: Normal range of motion.     Cervical back: Normal range  of  motion and neck supple.  Lymphadenopathy:     Cervical: No cervical adenopathy.  Skin:    General: Skin is warm and dry.     Findings: No rash.  Neurological:     Mental Status: He is alert and oriented to person, place, and time.     Sensory: No sensory deficit.     Deep Tendon Reflexes: Reflexes are normal and symmetric.  Psychiatric:        Mood and Affect: Mood is not anxious or depressed.     Wt Readings from Last 3 Encounters:  06/24/23 224 lb (101.6 kg)  02/04/23 225 lb 9.6 oz (102.3 kg)  07/04/22 213 lb (96.6 kg)    BP 136/86 (BP Location: Left Arm, Cuff Size: Large)   Pulse 83   Ht 5\' 10"  (1.778 m)   Wt 224 lb (101.6 kg)   SpO2 98%   BMI 32.14 kg/m   Assessment and Plan:  1. Hypercholesterolemia Chronic.  Controlled.  Stable.  Asymptomatic.  Patient denies any myalgias or muscle weakness.  Continue atorvastatin  10 mg once a day.  Review of previous lipid panel is acceptable and we will recheck in 6 months. - atorvastatin  (LIPITOR) 10 MG tablet; TAKE 1 TABLET(10 MG) BY MOUTH DAILY  Dispense: 30 tablet; Refill: 5  2. Essential (primary) hypertension (Primary) Chronic.  Controlled.  Stable.  Asymptomatic.  Blood pressure 136/86.  Asymptomatic.  Continue lisinopril  40 mg once a day.  Will check renal function panel for electrolytes and GFR.  Will recheck patient in 6 months. - atorvastatin  (LIPITOR) 10 MG tablet; TAKE 1 TABLET(10 MG) BY MOUTH DAILY  Dispense: 30 tablet; Refill: 5 - lisinopril  (ZESTRIL ) 40 MG tablet; TAKE 1 TABLET(40 MG) BY MOUTH DAILY  Dispense: 30 tablet; Refill: 5 - Renal Function Panel  3. Seasonal allergic rhinitis due to pollen Chronic.  Controlled.  Stable.  Patient has reasonable control of allergies on Flonase  1 spray each nostril daily and Sudafed 12 hours every 12 hours. - fluticasone  (FLONASE ) 50 MCG/ACT nasal spray; SHAKE LIQUID AND USE 1 SPRAY IN EACH NOSTRIL DAILY  Dispense: 16 g; Refill: 0 - pseudoephedrine  (WAL-PHED 12 HOUR) 120 MG 12  hr tablet; TAKE 1 TABLET BY MOUTH EVERY 12 HOURS AS NEEDED FOR CONGESTION  Dispense: 30 tablet; Refill: 11  4. Eustachian tube dysfunction, bilateral Chronic.  Controlled.  Stable.  Asymptomatic as noted above - montelukast  (SINGULAIR ) 10 MG tablet; TAKE 1 TABLET(10 MG) BY MOUTH AT BEDTIME  Dispense: 30 tablet; Refill: 5 - pseudoephedrine  (WAL-PHED 12 HOUR) 120 MG 12 hr tablet; TAKE 1 TABLET BY MOUTH EVERY 12 HOURS AS NEEDED FOR CONGESTION  Dispense: 30 tablet; Refill: 11  5. Gastroesophageal reflux disease without esophagitis Chronic.  Controlled.  Stable.  Continue pantoprazole  40 mg once a day.  Will recheck patient in 6 months. - pantoprazole  (PROTONIX ) 40 MG tablet; TAKE 1 TABLET(40 MG) BY MOUTH DAILY  Dispense: 30 tablet; Refill: 5  6. Vasculogenic erectile dysfunction, unspecified vasculogenic erectile dysfunction type Patient having excellent results on current dosing of test tadalafil  20 mg 1/2 to 1 tablet as needed.  Will recheck in 6 months. - tadalafil  (CIALIS ) 20 MG tablet; TAKE  1/2-1 TABLET BY MOUTH EVERY OTHER DAY AS NEEDED FOR ERECTILE DYSFUNCTION  Dispense: 20 tablet; Refill: 5  7. 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  Controlled.  Stable.  Patient has had low testosterone  in the past and we will refer to urology for further evaluation and continued maintenance of testosterone   supplementation.  As well as patient has been complaining of some swelling on a episodic basis of his testicle and would like to have this evaluated. - Testosterone  (ANDROGEL  PUMP) 20.25 MG/ACT (1.62%) GEL; Place 1 Pump onto the skin daily. Apply 1 pump to the forearm daily  Dispense: 75 g; Refill: 5 - Ambulatory referral to Urology    Alayne Allis, MD

## 2023-06-25 ENCOUNTER — Ambulatory Visit: Payer: Self-pay

## 2023-06-25 LAB — RENAL FUNCTION PANEL
Albumin: 4.7 g/dL (ref 3.8–4.9)
BUN/Creatinine Ratio: 15 (ref 10–24)
BUN: 13 mg/dL (ref 8–27)
CO2: 23 mmol/L (ref 20–29)
Calcium: 9 mg/dL (ref 8.6–10.2)
Chloride: 106 mmol/L (ref 96–106)
Creatinine, Ser: 0.84 mg/dL (ref 0.76–1.27)
Glucose: 78 mg/dL (ref 70–99)
Phosphorus: 3 mg/dL (ref 2.8–4.1)
Potassium: 4.6 mmol/L (ref 3.5–5.2)
Sodium: 143 mmol/L (ref 134–144)
eGFR: 100 mL/min/{1.73_m2} (ref >59)

## 2023-06-26 ENCOUNTER — Ambulatory Visit: Admitting: Physical Therapy

## 2023-06-26 DIAGNOSIS — R2689 Other abnormalities of gait and mobility: Secondary | ICD-10-CM

## 2023-06-26 DIAGNOSIS — R262 Difficulty in walking, not elsewhere classified: Secondary | ICD-10-CM | POA: Diagnosis not present

## 2023-06-26 DIAGNOSIS — R2681 Unsteadiness on feet: Secondary | ICD-10-CM

## 2023-06-26 NOTE — Therapy (Unsigned)
 OUTPATIENT PHYSICAL THERAPY TREATMENT/GOAL UPDATE    Patient Name: Tyler Glover MRN: [id number] DOB:Nov 05, 1960, 63 y.o., male Today's Date: 06/26/2023    PT End of Session - 06/26/23 1718     Visit Number 16    Number of Visits 17    Date for PT Re-Evaluation 06/20/23    PT Start Time 1718    PT Stop Time 1758    PT Time Calculation (min) 40 min    Equipment Utilized During Treatment Gait belt    Activity Tolerance Patient tolerated treatment well    Behavior During Therapy WFL for tasks assessed/performed              Past Medical History:  Diagnosis Date   Allergy    Arthritis    Rheumatoid   GERD (gastroesophageal reflux disease)    Hypertension    Past Surgical History:  Procedure Laterality Date   COLONOSCOPY     COLONOSCOPY WITH PROPOFOL  N/A 08/21/2019   Procedure: COLONOSCOPY WITH PROPOFOL ;  Surgeon: Marnee Sink, MD;  Location: [facility];  Service: Endoscopy;  Laterality: N/A;  priority 4   ESOPHAGOGASTRODUODENOSCOPY (EGD) WITH PROPOFOL  N/A 08/21/2019   Procedure: ESOPHAGOGASTRODUODENOSCOPY (EGD) WITH PROPOFOL ;  Surgeon: Marnee Sink, MD;  Location: [facility];  Service: Endoscopy;  Laterality: N/A;   POLYPECTOMY  08/21/2019   Procedure: POLYPECTOMY;  Surgeon: Marnee Sink, MD;  Location: [facility];  Service: Endoscopy;;   SHOULDER SURGERY     TESTICLE SURGERY     age 75 or 71   TONSILLECTOMY     Patient Active Problem List   Diagnosis Date Noted   Encounter for screening colonoscopy    Polyp of transverse colon    Heartburn    Pure hypercholesterolemia 01/26/2016   Right lateral epicondylitis 07/07/2015   Vitamin D  deficiency 12/24/2014   Prediabetes 12/24/2014   Allergic rhinitis, seasonal 06/03/2014   Acid reflux 06/03/2014   Essential (primary) hypertension 06/03/2014   Family history of diabetes mellitus 06/03/2014   Family history of cardiac disorder 06/03/2014   Obesity (BMI 30.0-34.9) 06/03/2014    PCP:  Clarise Crooks, MD  REFERRING PROVIDER: Aurther Lefevre, MD  REFERRING DIAGNOSIS:  G20.A1 (ICD-10-CM) - Parkinson's disease (HCC)    THERAPY DIAG: Difficulty in walking, not elsewhere classified  Unsteadiness on feet  Other abnormalities of gait and mobility  ONSET DATE: 02/2023  FOLLOW UP APPT WITH PROVIDER: Yes, f/u with referring provider in July 2025   RATIONALE FOR EVALUATION AND TREATMENT: Rehabilitation  Pertinent History Patient is a 63 year old male referred for gait/mobility deficits secondary to Parkinson's disease. Using amantadine for PD symptoms. Patient reports L side of his body is getting weaker. He reports he wants to be able to increase his activity. Pt reports comorbid low back pain and hip pain. Patient reports responding relatively well with monthly infusions of RA. Pt reports his LLE tends to drag - concerned for it catching on carpet.   Pain: Yes; multi-site associated with RA; low back and comorbid L knee pain.  Numbness/Tingling: No Focal Weakness: Yes; L-sided weakness, LE worse than UE Recent changes in overall health/medication: No Prior history of physical therapy for balance:  Yes; previous PT in [facility] outpatient clinic  Falls: Has patient fallen in last 6 months? No, Number of falls: N/A Directional pattern for falls: No Dominant hand: right Imaging: Yes ;  Normal brain MRI in 2023;   Prior level of function: Independent Occupational demands: IT trainer - M-F; p thas to  hold batteries, alternators, starters Hobbies: Traveling  Red flags (bowel/bladder changes, saddle paresthesia, personal history of cancer, h/o spinal tumors, h/o compression fx, h/o abdominal aneurysm, abdominal pain, chills/fever, night sweats, nausea, vomiting, unrelenting pain): Negative  Precautions: None  Weight Bearing Restrictions: No  Living Environment Lives with: lives with their spouse and mother  Lives in: House/apartment; one step to get into  home; stairs to get into home, pt has upstairs bedroom. Pt reports doing fairly well with stairs. Concrete from his car up to front steps  Has following equipment at home: None   Patient Goals: Improved mobility in L leg and L arm, improved strength in low back.     OBJECTIVE:   Patient Surveys  ABC: 91.3%  Cognition Patient is oriented to person, place, and time.  Recent memory is intact.  Remote memory is intact.  Attention span and concentration are intact.  Expressive speech is intact.  Patient's fund of knowledge is within normal limits for educational level.   Gross Musculoskeletal Assessment Tremor: No resting tremor  Bulk: Normal Tone: Cogwheel rigidity LE > UE. Hamstrings are spastic on L>R, Modified Ashworth 2 and 1, respectively.  GAIT: Distance walked: 80 ft Assistive device utilized: None Level of assistance: SBA Comments: Decreased arm swing, decreased trunk rotation with arm/leg swing, LLE decreased heel strike/mid-foot initial contact  Posture: Forward head, rounded shoulders   AROM  AROM (Normal range in degrees) AROM  04/22/2023  Lumbar   Flexion (65) 75%*  Extension (30) 50% no pain  Right lateral flexion (25) 75% (R-sided discomfort)  Left lateral flexion (25) 75% (R-sided discomfort)  Right rotation (30) 50%  Left rotation (30) 25%          Ankle    Dorsiflexion (20) WFL WFL  Plantarflexion (50)    Inversion (35)    Eversion (15    (* = pain; Blank rows = not tested)   LE MMT:  MMT (out of 5) Right 04/22/2023 Left 04/22/2023 Right 06/03/23 Left 06/03/23 Right 06/26/23 Left 06/26/23  Hip flexion 4- 3+ 5- 4- 5 4+  Hip extension        Hip abduction (seated) 5 5 5 5 5 5   Hip adduction (seated) 5 5 5 5 5 5   Hip internal rotation        Hip external rotation        Knee flexion 5 4 5  4+ 5 5  Knee extension 5 4 5 4 5 5   Ankle dorsiflexion 5 4- 5 4- 5 4  Ankle plantarflexion        Ankle inversion        Ankle eversion        (* =  pain; Blank rows = not tested)   Coordination/Cerebellar Finger to Nose: L-sided hypometria  Heel to Shin: L-sided impairment  Rapid alternating movements: Impaired L>R, mild impairment LE toe tapping  Finger Opposition: WNL Pronator Drift: Positive LUE   FUNCTIONAL OUTCOME MEASURES (initial eval)   Results Comments  BERG 50/56 Fall risk, in need of intervention  DGI Next visit/24   TUG 13.6 seconds   5TSTS 35.2 seconds   (Blank rows = not tested)   Postural Control Screen Romberg: EO WNL, EC up to 30 sec with significant sway and near-LOB x 2   Lumbar Spine Screen (04/24/23) SLUMP: Negative SLR: Negative Repeated extension in standing: increases, worse pain locally along low back    TODAY'S TREATMENT    SUBJECTIVE STATEMENT:   Patient reports progressively  worsening R lower lumbar back pain without paresthesias, sciatica, or lower limb referral. He feels that PT has helped for his condition with LLE weakness/gait deficits. Pt feels that his back pain is most limiting at this time, and he states that he may be getting PT referral for lumbar spine.   *GOAL UPDATE PERFORMED   Therapeutic Exercise - improved strength as needed to improve performance of CKC activities/functional movements and as needed for power production to prevent fall during episode of large postural perturbation   NuStep; Level 5, x 6 minutes - for improved soft tissue mobility and increased tissue temperature to improve muscle performance   -interval subjective gathered during this time   PATIENT EDUCATION: Discussed current progress with PT, plan for transition to eval & treat for lumbar spine pending new referral. Updated HEP and provided new MedBridge handout (see program below)   *not today* // bars: high knees with opposite knee touch, 5-lb ankle weights; 5x D/B    Neuromuscular Re-education - fall risk screening/management; for improved sensory integration, static and dynamic postural  control, equilibrium and non-equilibrium coordination as needed for negotiating home and community environment and stepping over obstacles   BERG and DGI performance*        *not today* Exaggerated arm and leg swing with 4-lb ankle weights; // bars; 5x D/B length of bars  Forward step up to hurdle stance, 6-inch step (staircase, center of gym); 2 x 10  Hurdle stepping: (3) 6-inch hurdles and (2) 12-inch hurdles; 4x D/B 6-inch step + Airex forward step up; 1 x 15 with each LE Forward step up to BOSU ball; x10 with each LLE leading; x 5 with RLE leading  -pain with unilateral loading on R side Tandem stance, on Airex pad; multiple attempts - up to 20 sec on each side without notable LOB  Obstacle course: alternating cone tap (4 cones on R and L); (3) 6-inch hurdles; Airex step up/down x 3 pads; 5x D/B  Sit to stand with 4-lb Med ball overhead reach, standing on Airex; 2x10  Toe tapping on single cone; 2 x 10 alternating R/L  Forward march along blue agility ladder, exaggerated arm and leg swing; 5x D/B  Toe tapping on 6-inch step; 2x10    PATIENT EDUCATION:  Education details: see above for patient education details Person educated: Patient Education method: Explanation Education comprehension: verbalized understanding   HOME EXERCISE PROGRAM: Access Code: [email or web address] URL: [email or web address] Date: 06/26/2023 Prepared by: Denese Finn  Exercises - Step Forward with Arms Reaching to Sides  - 2 x daily - 7 x weekly - 2 sets - 10 reps - Step Sideways with Arms Reaching  - 2 x daily - 7 x weekly - 2 sets - 10 reps - Sit to Stand with Arm Reach  - 2 x daily - 7 x weekly - 2 sets - 8 reps - Heel Toe Raises with Counter Support  - 2 x daily - 7 x weekly - 2 sets - 10 reps - Runner's Step Up/Down  - 1 x daily - 4-5 x weekly - 2 sets - 10 reps - Standing Single Leg Stance with Counter Support  - 1 x daily - 4-5 x weekly - 3-4 sets - 10sec hold   Access Code:  [email or web address] URL: [email or web address] Date: 04/24/2023 Prepared by: Denese Finn  Exercises - Supine Lower Trunk Rotation  - 2 x daily - 7 x weekly - 2 sets - 10 reps - Supine Piriformis Stretch with Foot on Ground  -  2 x daily - 7 x weekly - 3 sets - 30sec hold - Seated Hamstring Stretch  - 2 x daily - 7 x weekly - 3 sets - 30sec hold   ASSESSMENT:  CLINICAL IMPRESSION: Patient has met majority of performance-based goals, and he is able to surpass well established cut-off scores for DGI, BERG, TUG, and 5TSTS. His primary deficit is remaining distal LLE weakness. He does feel that he has to consciously ensure he is lifting LLE during gait and obstacle negotiation - he reports more awareness of this. Pt has intermittent HEP compliance given his work schedule and limited time during the weekdays. Pt does participate very well with physical therapy, but he is largely limited by R lower quarter/R lower lumbar pain without symptoms reported for classic radiculopathy. Pt is following up with spine specialist, and may have new referral for PT focused on lumbar spine. We can transition to focus on targeted manual therapy/exercise for low back if new referral is received from spine clinic (pt recently referred to spine specialist).  Patient will benefit from skilled PT to address above impairments and improve overall function.  REHAB POTENTIAL: Good  CLINICAL DECISION MAKING: Evolving/moderate complexity  EVALUATION COMPLEXITY: High   GOALS: Goals reviewed with patient? Yes  SHORT TERM GOALS: Target date: 05/16/2023  Pt will be independent with HEP in order to improve strength and balance in order to decrease fall risk and improve function at home. Baseline: 04/22/23: Formal HEP to be issued on visit # 2.   04/24/23: HEP given for thoracolumbar mobility, reviewed baseline exercises for LE strengthening/fall risk.    06/03/23: Pt is 50% compliant; he has difficulty completing on some  days.   06/26/23: Dec compliance through work week; gets some exercise done each day.  Goal status: ON-GOING   LONG TERM GOALS: Target date: 06/20/2023  Patient will improve MMT of tested LLE musculature to 4+/5 or greater indicative of improved strength as needed for power production to prevent fall and to improve performance of transfers and stair negotiation.  Baseline: 04/22/23: LLE gross strength 3+ to 4 (exception of seated hip ABD/ADD 5/5, not tested in standard testing position).     06/03/23: Modest improvement in strength, no change in L quads or dorsiflexors.   06/26/23: Met for all with exception of dorsiflexor weakness Goal status: PARTIALLY MET/IN PROGRESS   2.  Pt will improve BERG by at least 3 points in order to demonstrate clinically significant improvement in balance.   Baseline: 04/22/23: To be completed on visit # 2.    04/24/23: 50/56       06/03/23: 52/56      06/26/23: 54/56 Goal status: ACHIEVED  3. Pt will decrease 5TSTS by at least 3 seconds in order to demonstrate clinically significant improvement in LE strength      Baseline: 04/22/23: 35.2 sec     06/03/23: 14.3 sec Goal status: ACHIEVED   4. Pt will improve DGI by at least 3 points in order to demonstrate clinically significant improvement in balance and decreased risk for falls.     Baseline: 04/22/23: To be completed on visit # 3.  04/29/23: 15/24    06/03/23: 18/24    06/26/23: 21/24 Goal status: ACHIEVED   5. Pt will complete TUG in less than 11.5 sec indicative of decreased fall risk per clinical population Baseline: 04/22/23: 13.6 sec     06/03/23: 10.9 sec  Goal status: ACHIEVED    PLAN: PT FREQUENCY: 2x/week  PT DURATION: 3-4  weeks  PLANNED INTERVENTIONS: Therapeutic exercises, Therapeutic activity, Neuromuscular re-education, Balance training, Gait training, Patient/Family education, Joint manipulation, Joint mobilization, Canalith repositioning, Aquatic Therapy, Dry Needling, Cognitive remediation, Electrical  stimulation, Spinal manipulation, Spinal mobilization, Cryotherapy, Moist heat, Traction, Ultrasound, Ionotophoresis 4mg /ml Dexamethasone, and Manual therapy  PLAN FOR NEXT SESSION: Continue with LE strengthening, LLE movement control and proprioceptive/kinesthetic awareness. Transition to evaluation/treatment for lumbar spine with new episode of care pending new referral.     Denese Finn, PT, DPT [phone]  Aleatha Hunting 06/26/2023, 5:20 PM

## 2023-06-28 ENCOUNTER — Encounter: Payer: Self-pay | Admitting: Physical Therapy

## 2023-07-04 ENCOUNTER — Ambulatory Visit: Payer: Self-pay | Admitting: Family Medicine

## 2023-07-15 ENCOUNTER — Ambulatory Visit: Admitting: Physical Therapy

## 2023-07-15 NOTE — Therapy (Deleted)
 OUTPATIENT PHYSICAL THERAPY TREATMENT/GOAL UPDATE    Patient Name: Tyler Glover MRN: [id number] DOB:[birth year], 63 y.o., male Today's Date: 07/15/2023       Past Medical History:  Diagnosis Date   Allergy    Arthritis    Rheumatoid   GERD (gastroesophageal reflux disease)    Hypertension    Past Surgical History:  Procedure Laterality Date   COLONOSCOPY     COLONOSCOPY WITH PROPOFOL  N/A 08/21/2019   Procedure: COLONOSCOPY WITH PROPOFOL ;  Surgeon: Marnee Sink, MD;  Location: [facility];  Service: Endoscopy;  Laterality: N/A;  priority 4   ESOPHAGOGASTRODUODENOSCOPY (EGD) WITH PROPOFOL  N/A 08/21/2019   Procedure: ESOPHAGOGASTRODUODENOSCOPY (EGD) WITH PROPOFOL ;  Surgeon: Marnee Sink, MD;  Location: [facility];  Service: Endoscopy;  Laterality: N/A;   POLYPECTOMY  08/21/2019   Procedure: POLYPECTOMY;  Surgeon: Marnee Sink, MD;  Location: [facility];  Service: Endoscopy;;   SHOULDER SURGERY     TESTICLE SURGERY     age 18 or 60   TONSILLECTOMY     Patient Active Problem List   Diagnosis Date Noted   Encounter for screening colonoscopy    Polyp of transverse colon    Heartburn    Pure hypercholesterolemia 01/26/2016   Right lateral epicondylitis 07/07/2015   Vitamin D  deficiency 12/24/2014   Prediabetes 12/24/2014   Allergic rhinitis, seasonal 06/03/2014   Acid reflux 06/03/2014   Essential (primary) hypertension 06/03/2014   Family history of diabetes mellitus 06/03/2014   Family history of cardiac disorder 06/03/2014   Obesity (BMI 30.0-34.9) 06/03/2014    PCP: Clarise Crooks, MD  REFERRING PROVIDER: Aurther Lefevre, MD  REFERRING DIAGNOSIS:  G20.A1 (ICD-10-CM) - Parkinson's disease (HCC)    THERAPY DIAG: Difficulty in walking, not elsewhere classified  Unsteadiness on feet  Other abnormalities of gait and mobility  ONSET DATE: 02/2023  FOLLOW UP APPT WITH PROVIDER: Yes, f/u with referring provider in July 2025   RATIONALE  FOR EVALUATION AND TREATMENT: Rehabilitation  Pertinent History Patient is a 63 year old male referred for gait/mobility deficits secondary to Parkinson's disease. Using amantadine for PD symptoms. Patient reports L side of his body is getting weaker. He reports he wants to be able to increase his activity. Pt reports comorbid low back pain and hip pain. Patient reports responding relatively well with monthly infusions of RA. Pt reports his LLE tends to drag - concerned for it catching on carpet.   Pain: Yes; multi-site associated with RA; low back and comorbid L knee pain.  Numbness/Tingling: No Focal Weakness: Yes; L-sided weakness, LE worse than UE Recent changes in overall health/medication: No Prior history of physical therapy for balance:  Yes; previous PT in [facility] outpatient clinic  Falls: Has patient fallen in last 6 months? No, Number of falls: N/A Directional pattern for falls: No Dominant hand: right Imaging: Yes ;  Normal brain MRI in 2023;   Prior level of function: Independent Occupational demands: IT trainer - M-F; p thas to hold batteries, alternators, starters Hobbies: Traveling  Red flags (bowel/bladder changes, saddle paresthesia, personal history of cancer, h/o spinal tumors, h/o compression fx, h/o abdominal aneurysm, abdominal pain, chills/fever, night sweats, nausea, vomiting, unrelenting pain): Negative  Precautions: None  Weight Bearing Restrictions: No  Living Environment Lives with: lives with their spouse and mother  Lives in: House/apartment; one step to get into home; stairs to get into home, pt has upstairs bedroom. Pt reports doing fairly well with stairs. Concrete from his car up to front steps  Has following equipment at home: None   Patient Goals: Improved mobility in L leg and L arm, improved strength in low back.     OBJECTIVE:   Patient Surveys  ABC: 91.3%  Cognition Patient is oriented to person, place, and time.  Recent  memory is intact.  Remote memory is intact.  Attention span and concentration are intact.  Expressive speech is intact.  Patient's fund of knowledge is within normal limits for educational level.   Gross Musculoskeletal Assessment Tremor: No resting tremor  Bulk: Normal Tone: Cogwheel rigidity LE > UE. Hamstrings are spastic on L>R, Modified Ashworth 2 and 1, respectively.  GAIT: Distance walked: 80 ft Assistive device utilized: None Level of assistance: SBA Comments: Decreased arm swing, decreased trunk rotation with arm/leg swing, LLE decreased heel strike/mid-foot initial contact  Posture: Forward head, rounded shoulders   AROM  AROM (Normal range in degrees) AROM  04/22/2023  Lumbar   Flexion (65) 75%*  Extension (30) 50% no pain  Right lateral flexion (25) 75% (R-sided discomfort)  Left lateral flexion (25) 75% (R-sided discomfort)  Right rotation (30) 50%  Left rotation (30) 25%          Ankle    Dorsiflexion (20) WFL WFL  Plantarflexion (50)    Inversion (35)    Eversion (15    (* = pain; Blank rows = not tested)   LE MMT:  MMT (out of 5) Right 04/22/2023 Left 04/22/2023 Right 06/03/23 Left 06/03/23 Right 06/26/23 Left 06/26/23  Hip flexion 4- 3+ 5- 4- 5 4+  Hip extension        Hip abduction (seated) 5 5 5 5 5 5   Hip adduction (seated) 5 5 5 5 5 5   Hip internal rotation        Hip external rotation        Knee flexion 5 4 5  4+ 5 5  Knee extension 5 4 5 4 5 5   Ankle dorsiflexion 5 4- 5 4- 5 4  Ankle plantarflexion        Ankle inversion        Ankle eversion        (* = pain; Blank rows = not tested)   Coordination/Cerebellar Finger to Nose: L-sided hypometria  Heel to Shin: L-sided impairment  Rapid alternating movements: Impaired L>R, mild impairment LE toe tapping  Finger Opposition: WNL Pronator Drift: Positive LUE   FUNCTIONAL OUTCOME MEASURES (initial eval)   Results Comments  BERG 50/56 Fall risk, in need of intervention  DGI Next  visit/24   TUG 13.6 seconds   5TSTS 35.2 seconds   (Blank rows = not tested)   Postural Control Screen Romberg: EO WNL, EC up to 30 sec with significant sway and near-LOB x 2   Lumbar Spine Screen (04/24/23) SLUMP: Negative SLR: Negative Repeated extension in standing: increases, worse pain locally along low back    TODAY'S TREATMENT    SUBJECTIVE STATEMENT:   Patient reports progressively worsening R lower lumbar back pain without paresthesias, sciatica, or lower limb referral. He feels that PT has helped for his condition with LLE weakness/gait deficits. Pt feels that his back pain is most limiting at this time, and he states that he may be getting PT referral for lumbar spine.   *GOAL UPDATE PERFORMED   Therapeutic Exercise - improved strength as needed to improve performance of CKC activities/functional movements and as needed for power production to prevent fall during episode of large postural perturbation   NuStep;  Level 5, x 6 minutes - for improved soft tissue mobility and increased tissue temperature to improve muscle performance   -interval subjective gathered during this time   PATIENT EDUCATION: Discussed current progress with PT, plan for transition to eval & treat for lumbar spine pending new referral. Updated HEP and provided new MedBridge handout (see program below)   *not today* // bars: high knees with opposite knee touch, 5-lb ankle weights; 5x D/B    Neuromuscular Re-education - fall risk screening/management; for improved sensory integration, static and dynamic postural control, equilibrium and non-equilibrium coordination as needed for negotiating home and community environment and stepping over obstacles   BERG and DGI performance*        *not today* Exaggerated arm and leg swing with 4-lb ankle weights; // bars; 5x D/B length of bars  Forward step up to hurdle stance, 6-inch step (staircase, center of gym); 2 x 10  Hurdle stepping: (3) 6-inch  hurdles and (2) 12-inch hurdles; 4x D/B 6-inch step + Airex forward step up; 1 x 15 with each LE Forward step up to BOSU ball; x10 with each LLE leading; x 5 with RLE leading  -pain with unilateral loading on R side Tandem stance, on Airex pad; multiple attempts - up to 20 sec on each side without notable LOB  Obstacle course: alternating cone tap (4 cones on R and L); (3) 6-inch hurdles; Airex step up/down x 3 pads; 5x D/B  Sit to stand with 4-lb Med ball overhead reach, standing on Airex; 2x10  Toe tapping on single cone; 2 x 10 alternating R/L  Forward march along blue agility ladder, exaggerated arm and leg swing; 5x D/B  Toe tapping on 6-inch step; 2x10    PATIENT EDUCATION:  Education details: see above for patient education details Person educated: Patient Education method: Explanation Education comprehension: verbalized understanding   HOME EXERCISE PROGRAM: Access Code: [email or web address] URL: [email or web address] Date: 06/26/2023 Prepared by: Denese Finn  Exercises - Step Forward with Arms Reaching to Sides  - 2 x daily - 7 x weekly - 2 sets - 10 reps - Step Sideways with Arms Reaching  - 2 x daily - 7 x weekly - 2 sets - 10 reps - Sit to Stand with Arm Reach  - 2 x daily - 7 x weekly - 2 sets - 8 reps - Heel Toe Raises with Counter Support  - 2 x daily - 7 x weekly - 2 sets - 10 reps - Runner's Step Up/Down  - 1 x daily - 4-5 x weekly - 2 sets - 10 reps - Standing Single Leg Stance with Counter Support  - 1 x daily - 4-5 x weekly - 3-4 sets - 10sec hold   Access Code: [email or web address] URL: [email or web address] Date: 04/24/2023 Prepared by: Denese Finn  Exercises - Supine Lower Trunk Rotation  - 2 x daily - 7 x weekly - 2 sets - 10 reps - Supine Piriformis Stretch with Foot on Ground  - 2 x daily - 7 x weekly - 3 sets - 30sec hold - Seated Hamstring Stretch  - 2 x daily - 7 x weekly - 3 sets - 30sec hold   ASSESSMENT:  CLINICAL  IMPRESSION: Patient has met majority of performance-based goals, and he is able to surpass well established cut-off scores for DGI, BERG, TUG, and 5TSTS. His primary deficit is remaining distal LLE weakness. He does feel that he has to consciously ensure he is lifting LLE during gait and obstacle negotiation -  he reports more awareness of this. Pt has intermittent HEP compliance given his work schedule and limited time during the weekdays. Pt does participate very well with physical therapy, but he is largely limited by R lower quarter/R lower lumbar pain without symptoms reported for classic radiculopathy. Pt is following up with spine specialist, and may have new referral for PT focused on lumbar spine. We can transition to focus on targeted manual therapy/exercise for low back if new referral is received from spine clinic (pt recently referred to spine specialist).  Patient will benefit from skilled PT to address above impairments and improve overall function.  REHAB POTENTIAL: Good  CLINICAL DECISION MAKING: Evolving/moderate complexity  EVALUATION COMPLEXITY: High   GOALS: Goals reviewed with patient? Yes  SHORT TERM GOALS: Target date: 05/16/2023  Pt will be independent with HEP in order to improve strength and balance in order to decrease fall risk and improve function at home. Baseline: 04/22/23: Formal HEP to be issued on visit # 2.   04/24/23: HEP given for thoracolumbar mobility, reviewed baseline exercises for LE strengthening/fall risk.    06/03/23: Pt is 50% compliant; he has difficulty completing on some days.   06/26/23: Dec compliance through work week; gets some exercise done each day.  Goal status: ON-GOING   LONG TERM GOALS: Target date: 06/20/2023  Patient will improve MMT of tested LLE musculature to 4+/5 or greater indicative of improved strength as needed for power production to prevent fall and to improve performance of transfers and stair negotiation.  Baseline: 04/22/23: LLE  gross strength 3+ to 4 (exception of seated hip ABD/ADD 5/5, not tested in standard testing position).     06/03/23: Modest improvement in strength, no change in L quads or dorsiflexors.   06/26/23: Met for all with exception of dorsiflexor weakness Goal status: PARTIALLY MET/IN PROGRESS   2.  Pt will improve BERG by at least 3 points in order to demonstrate clinically significant improvement in balance.   Baseline: 04/22/23: To be completed on visit # 2.    04/24/23: 50/56       06/03/23: 52/56      06/26/23: 54/56 Goal status: ACHIEVED  3. Pt will decrease 5TSTS by at least 3 seconds in order to demonstrate clinically significant improvement in LE strength      Baseline: 04/22/23: 35.2 sec     06/03/23: 14.3 sec Goal status: ACHIEVED   4. Pt will improve DGI by at least 3 points in order to demonstrate clinically significant improvement in balance and decreased risk for falls.     Baseline: 04/22/23: To be completed on visit # 3.  04/29/23: 15/24    06/03/23: 18/24    06/26/23: 21/24 Goal status: ACHIEVED   5. Pt will complete TUG in less than 11.5 sec indicative of decreased fall risk per clinical population Baseline: 04/22/23: 13.6 sec     06/03/23: 10.9 sec  Goal status: ACHIEVED    PLAN: PT FREQUENCY: 2x/week  PT DURATION: 3-4 weeks  PLANNED INTERVENTIONS: Therapeutic exercises, Therapeutic activity, Neuromuscular re-education, Balance training, Gait training, Patient/Family education, Joint manipulation, Joint mobilization, Canalith repositioning, Aquatic Therapy, Dry Needling, Cognitive remediation, Electrical stimulation, Spinal manipulation, Spinal mobilization, Cryotherapy, Moist heat, Traction, Ultrasound, Ionotophoresis 4mg /ml Dexamethasone, and Manual therapy  PLAN FOR NEXT SESSION: Continue with LE strengthening, LLE movement control and proprioceptive/kinesthetic awareness. Transition to evaluation/treatment for lumbar spine with new episode of care pending new referral.     Denese Finn, PT, DPT [phone]  Aleatha Hunting  07/15/2023, 7:30 AM

## 2023-07-16 ENCOUNTER — Ambulatory Visit: Admitting: Urology

## 2023-07-16 ENCOUNTER — Encounter: Payer: Self-pay | Admitting: Physical Therapy

## 2023-07-16 ENCOUNTER — Ambulatory Visit: Payer: Self-pay | Attending: Physical Medicine & Rehabilitation | Admitting: Physical Therapy

## 2023-07-16 DIAGNOSIS — R262 Difficulty in walking, not elsewhere classified: Secondary | ICD-10-CM | POA: Diagnosis present

## 2023-07-16 DIAGNOSIS — R2681 Unsteadiness on feet: Secondary | ICD-10-CM | POA: Insufficient documentation

## 2023-07-16 DIAGNOSIS — R2689 Other abnormalities of gait and mobility: Secondary | ICD-10-CM | POA: Insufficient documentation

## 2023-07-16 NOTE — Therapy (Signed)
 OUTPATIENT PHYSICAL THERAPY TREATMENT/GOAL UPDATE    Patient Name: Tyler Glover MRN: [id number] DOB:31-Oct-1960, 63 y.o., male Today's Date: 07/16/2023    PT End of Session - 07/16/23 1122     Visit Number 17    Number of Visits 20    Date for PT Re-Evaluation 06/20/23    PT Start Time 1117    PT Stop Time 1203    PT Time Calculation (min) 46 min    Equipment Utilized During Treatment Gait belt    Activity Tolerance Patient tolerated treatment well    Behavior During Therapy WFL for tasks assessed/performed               Past Medical History:  Diagnosis Date   Allergy    Arthritis    Rheumatoid   GERD (gastroesophageal reflux disease)    Hypertension    Past Surgical History:  Procedure Laterality Date   COLONOSCOPY     COLONOSCOPY WITH PROPOFOL  N/A 08/21/2019   Procedure: COLONOSCOPY WITH PROPOFOL ;  Surgeon: Marnee Sink, MD;  Location: [facility];  Service: Endoscopy;  Laterality: N/A;  priority 4   ESOPHAGOGASTRODUODENOSCOPY (EGD) WITH PROPOFOL  N/A 08/21/2019   Procedure: ESOPHAGOGASTRODUODENOSCOPY (EGD) WITH PROPOFOL ;  Surgeon: Marnee Sink, MD;  Location: [facility];  Service: Endoscopy;  Laterality: N/A;   POLYPECTOMY  08/21/2019   Procedure: POLYPECTOMY;  Surgeon: Marnee Sink, MD;  Location: [facility];  Service: Endoscopy;;   SHOULDER SURGERY     TESTICLE SURGERY     age 57 or 6   TONSILLECTOMY     Patient Active Problem List   Diagnosis Date Noted   Encounter for screening colonoscopy    Polyp of transverse colon    Heartburn    Pure hypercholesterolemia 01/26/2016   Right lateral epicondylitis 07/07/2015   Vitamin D  deficiency 12/24/2014   Prediabetes 12/24/2014   Allergic rhinitis, seasonal 06/03/2014   Acid reflux 06/03/2014   Essential (primary) hypertension 06/03/2014   Family history of diabetes mellitus 06/03/2014   Family history of cardiac disorder 06/03/2014   Obesity (BMI 30.0-34.9) 06/03/2014    PCP:  Clarise Crooks, MD  REFERRING PROVIDER: Aurther Lefevre, MD  REFERRING DIAGNOSIS:  G20.A1 (ICD-10-CM) - Parkinson's disease (HCC)    THERAPY DIAG: Difficulty in walking, not elsewhere classified  Unsteadiness on feet  Other abnormalities of gait and mobility  ONSET DATE: 02/2023  FOLLOW UP APPT WITH PROVIDER: Yes, f/u with referring provider in July 2025   RATIONALE FOR EVALUATION AND TREATMENT: Rehabilitation  Pertinent History Patient is a 63 year old male referred for gait/mobility deficits secondary to Parkinson's disease. Using amantadine for PD symptoms. Patient reports L side of his body is getting weaker. He reports he wants to be able to increase his activity. Pt reports comorbid low back pain and hip pain. Patient reports responding relatively well with monthly infusions of RA. Pt reports his LLE tends to drag - concerned for it catching on carpet.   Pain: Yes; multi-site associated with RA; low back and comorbid L knee pain.  Numbness/Tingling: No Focal Weakness: Yes; L-sided weakness, LE worse than UE Recent changes in overall health/medication: No Prior history of physical therapy for balance:  Yes; previous PT in [facility] outpatient clinic  Falls: Has patient fallen in last 6 months? No, Number of falls: N/A Directional pattern for falls: No Dominant hand: right Imaging: Yes ;  Normal brain MRI in 2023;   Prior level of function: Independent Occupational demands: IT trainer - M-F; p thas  to hold batteries, alternators, starters Hobbies: Traveling  Red flags (bowel/bladder changes, saddle paresthesia, personal history of cancer, h/o spinal tumors, h/o compression fx, h/o abdominal aneurysm, abdominal pain, chills/fever, night sweats, nausea, vomiting, unrelenting pain): Negative  Precautions: None  Weight Bearing Restrictions: No  Living Environment Lives with: lives with their spouse and mother  Lives in: House/apartment; one step to get into  home; stairs to get into home, pt has upstairs bedroom. Pt reports doing fairly well with stairs. Concrete from his car up to front steps  Has following equipment at home: None   Patient Goals: Improved mobility in L leg and L arm, improved strength in low back.     OBJECTIVE:   Patient Surveys  ABC: 91.3%  Cognition Patient is oriented to person, place, and time.  Recent memory is intact.  Remote memory is intact.  Attention span and concentration are intact.  Expressive speech is intact.  Patient's fund of knowledge is within normal limits for educational level.   Gross Musculoskeletal Assessment Tremor: No resting tremor  Bulk: Normal Tone: Cogwheel rigidity LE > UE. Hamstrings are spastic on L>R, Modified Ashworth 2 and 1, respectively.  GAIT: Distance walked: 80 ft Assistive device utilized: None Level of assistance: SBA Comments: Decreased arm swing, decreased trunk rotation with arm/leg swing, LLE decreased heel strike/mid-foot initial contact  Posture: Forward head, rounded shoulders   AROM  AROM (Normal range in degrees) AROM  04/22/2023  Lumbar   Flexion (65) 75%*  Extension (30) 50% no pain  Right lateral flexion (25) 75% (R-sided discomfort)  Left lateral flexion (25) 75% (R-sided discomfort)  Right rotation (30) 50%  Left rotation (30) 25%          Ankle    Dorsiflexion (20) WFL WFL  Plantarflexion (50)    Inversion (35)    Eversion (15    (* = pain; Blank rows = not tested)   LE MMT:  MMT (out of 5) Right 04/22/2023 Left 04/22/2023 Right 06/03/23 Left 06/03/23 Right 06/26/23 Left 06/26/23  Hip flexion 4- 3+ 5- 4- 5 4+  Hip extension        Hip abduction (seated) 5 5 5 5 5 5   Hip adduction (seated) 5 5 5 5 5 5   Hip internal rotation        Hip external rotation        Knee flexion 5 4 5  4+ 5 5  Knee extension 5 4 5 4 5 5   Ankle dorsiflexion 5 4- 5 4- 5 4  Ankle plantarflexion        Ankle inversion        Ankle eversion        (* =  pain; Blank rows = not tested)   Coordination/Cerebellar Finger to Nose: L-sided hypometria  Heel to Shin: L-sided impairment  Rapid alternating movements: Impaired L>R, mild impairment LE toe tapping  Finger Opposition: WNL Pronator Drift: Positive LUE   FUNCTIONAL OUTCOME MEASURES (initial eval)   Results Comments  BERG 50/56 Fall risk, in need of intervention  DGI Next visit/24   TUG 13.6 seconds   5TSTS 35.2 seconds   (Blank rows = not tested)   Postural Control Screen Romberg: EO WNL, EC up to 30 sec with significant sway and near-LOB x 2   Lumbar Spine Screen (04/24/23) SLUMP: Negative SLR: Negative Repeated extension in standing: increases, worse pain locally along low back    TODAY'S TREATMENT    SUBJECTIVE STATEMENT:   Patient had  ESI yesterday and was instructed to hold on exercise loading lumbar spine. His provider stated he can work on gait/balance. Patient will be traveling over next few weeks - next f/u 08/12/23.     Therapeutic Exercise - improved strength as needed to improve performance of CKC activities/functional movements and as needed for power production to prevent fall during episode of large postural perturbation   NuStep; Level 5, x 5 minutes - for improved soft tissue mobility and increased tissue temperature to improve muscle performance   -interval subjective gathered during this time   PATIENT EDUCATION: Discussed current progress with PT, plan for transition to eval & treat for lumbar spine pending new referral upon completion of this episode of care.    *not today* // bars: high knees with opposite knee touch, 5-lb ankle weights; 5x D/B    Neuromuscular Re-education - fall risk screening/management; for improved sensory integration, static and dynamic postural control, equilibrium and non-equilibrium coordination as needed for negotiating home and community environment and stepping over obstacles   Forward step up to hurdle stance,  6-inch step (staircase, center of gym); x 10 with each LE  Forward step up to BOSU ball; x10 with each LLE leading; x 5 with RLE leading  -pain with unilateral loading on R side   Hurdle stepping: (3) 6-inch hurdles and (2) 12-inch hurdles; 4x D/B   In // bars:Tandem stance, on Airex pad; 2 x 30 sec bilat, intermittent RUE touch during second set   Standing rockerboard, double limb; x 15 (forward/backward)    *not today* Exaggerated arm and leg swing with 4-lb ankle weights; // bars; 5x D/B length of bars 6-inch step + Airex forward step up; 1 x 15 with each LE  Obstacle course: alternating cone tap (4 cones on R and L); (3) 6-inch hurdles; Airex step up/down x 3 pads; 5x D/B  Sit to stand with 4-lb Med ball overhead reach, standing on Airex; 2x10  Toe tapping on single cone; 2 x 10 alternating R/L  Forward march along blue agility ladder, exaggerated arm and leg swing; 5x D/B  Toe tapping on 6-inch step; 2x10    PATIENT EDUCATION:  Education details: see above for patient education details Person educated: Patient Education method: Explanation Education comprehension: verbalized understanding   HOME EXERCISE PROGRAM: Access Code: [email or web address] URL: [email or web address] Date: 06/26/2023 Prepared by: Denese Finn  Exercises - Step Forward with Arms Reaching to Sides  - 2 x daily - 7 x weekly - 2 sets - 10 reps - Step Sideways with Arms Reaching  - 2 x daily - 7 x weekly - 2 sets - 10 reps - Sit to Stand with Arm Reach  - 2 x daily - 7 x weekly - 2 sets - 8 reps - Heel Toe Raises with Counter Support  - 2 x daily - 7 x weekly - 2 sets - 10 reps - Runner's Step Up/Down  - 1 x daily - 4-5 x weekly - 2 sets - 10 reps - Standing Single Leg Stance with Counter Support  - 1 x daily - 4-5 x weekly - 3-4 sets - 10sec hold   Access Code: [email or web address] URL: [email or web address] Date: 04/24/2023 Prepared by: Denese Finn  Exercises - Supine Lower Trunk  Rotation  - 2 x daily - 7 x weekly - 2 sets - 10 reps - Supine Piriformis Stretch with Foot on Ground  - 2 x daily - 7 x weekly - 3 sets - 30sec hold - Seated Hamstring  Stretch  - 2 x daily - 7 x weekly - 3 sets - 30sec hold   ASSESSMENT:  CLINICAL IMPRESSION: Patient has made excellent progress, but he does still have some difficulty with LLE weakness and intermittently feeling of "dragging" LLE during gait. He just had change in meds for Parkinson's disease management and is unsure about effectiveness of this presently. Pt fortunately has met cut-off scores and demonstrated notable improvement with outcome measures, but he has remaining LLE weakness. We can transition to focus on targeted manual therapy/exercise for low back if new referral is received from spine clinic (pt recently referred to spine specialist).  Patient will benefit from skilled PT to address above impairments and improve overall function.  REHAB POTENTIAL: Good  CLINICAL DECISION MAKING: Evolving/moderate complexity  EVALUATION COMPLEXITY: High   GOALS: Goals reviewed with patient? Yes  SHORT TERM GOALS: Target date: 05/16/2023  Pt will be independent with HEP in order to improve strength and balance in order to decrease fall risk and improve function at home. Baseline: 04/22/23: Formal HEP to be issued on visit # 2.   04/24/23: HEP given for thoracolumbar mobility, reviewed baseline exercises for LE strengthening/fall risk.    06/03/23: Pt is 50% compliant; he has difficulty completing on some days.   06/26/23: Dec compliance through work week; gets some exercise done each day.  Goal status: ON-GOING   LONG TERM GOALS: Target date: 06/20/2023  Patient will improve MMT of tested LLE musculature to 4+/5 or greater indicative of improved strength as needed for power production to prevent fall and to improve performance of transfers and stair negotiation.  Baseline: 04/22/23: LLE gross strength 3+ to 4 (exception of seated  hip ABD/ADD 5/5, not tested in standard testing position).     06/03/23: Modest improvement in strength, no change in L quads or dorsiflexors.   06/26/23: Met for all with exception of dorsiflexor weakness Goal status: PARTIALLY MET/IN PROGRESS   2.  Pt will improve BERG by at least 3 points in order to demonstrate clinically significant improvement in balance.   Baseline: 04/22/23: To be completed on visit # 2.    04/24/23: 50/56       06/03/23: 52/56      06/26/23: 54/56 Goal status: ACHIEVED  3. Pt will decrease 5TSTS by at least 3 seconds in order to demonstrate clinically significant improvement in LE strength      Baseline: 04/22/23: 35.2 sec     06/03/23: 14.3 sec Goal status: ACHIEVED   4. Pt will improve DGI by at least 3 points in order to demonstrate clinically significant improvement in balance and decreased risk for falls.     Baseline: 04/22/23: To be completed on visit # 3.  04/29/23: 15/24    06/03/23: 18/24    06/26/23: 21/24 Goal status: ACHIEVED   5. Pt will complete TUG in less than 11.5 sec indicative of decreased fall risk per clinical population Baseline: 04/22/23: 13.6 sec     06/03/23: 10.9 sec  Goal status: ACHIEVED    PLAN: PT FREQUENCY: 2x/week  PT DURATION: 3-4 weeks  PLANNED INTERVENTIONS: Therapeutic exercises, Therapeutic activity, Neuromuscular re-education, Balance training, Gait training, Patient/Family education, Joint manipulation, Joint mobilization, Canalith repositioning, Aquatic Therapy, Dry Needling, Cognitive remediation, Electrical stimulation, Spinal manipulation, Spinal mobilization, Cryotherapy, Moist heat, Traction, Ultrasound, Ionotophoresis 4mg /ml Dexamethasone, and Manual therapy  PLAN FOR NEXT SESSION: Continue with LE strengthening, LLE movement control and proprioceptive/kinesthetic awareness. Transition to evaluation/treatment for lumbar spine with new episode of care  pending new referral.     Denese Finn, PT, DPT [phone]  Aleatha Hunting 07/16/2023, 12:03 PM

## 2023-07-17 ENCOUNTER — Ambulatory Visit: Admitting: Physical Therapy

## 2023-08-12 ENCOUNTER — Ambulatory Visit: Admitting: Physical Therapy

## 2023-08-12 ENCOUNTER — Encounter: Payer: Self-pay | Admitting: Physical Therapy

## 2023-08-12 DIAGNOSIS — R262 Difficulty in walking, not elsewhere classified: Secondary | ICD-10-CM

## 2023-08-12 DIAGNOSIS — R2681 Unsteadiness on feet: Secondary | ICD-10-CM

## 2023-08-12 DIAGNOSIS — R2689 Other abnormalities of gait and mobility: Secondary | ICD-10-CM

## 2023-08-12 NOTE — Therapy (Signed)
 OUTPATIENT PHYSICAL THERAPY TREATMENT/GOAL UPDATE    Patient Name: Tyler Glover MRN: [id number] DOB:1960-07-24, 63 y.o., male Today's Date: 08/12/2023    PT End of Session - 08/12/23 0943     Visit Number 18    Number of Visits 20    Date for PT Re-Evaluation 06/20/23    PT Start Time 0946    PT Stop Time 1027    PT Time Calculation (min) 41 min    Equipment Utilized During Treatment Gait belt    Activity Tolerance Patient tolerated treatment well    Behavior During Therapy WFL for tasks assessed/performed             Past Medical History:  Diagnosis Date   Allergy    Arthritis    Rheumatoid   GERD (gastroesophageal reflux disease)    Hypertension    Past Surgical History:  Procedure Laterality Date   COLONOSCOPY     COLONOSCOPY WITH PROPOFOL  N/A 08/21/2019   Procedure: COLONOSCOPY WITH PROPOFOL ;  Surgeon: Jinny Carmine, MD;  Location: [facility];  Service: Endoscopy;  Laterality: N/A;  priority 4   ESOPHAGOGASTRODUODENOSCOPY (EGD) WITH PROPOFOL  N/A 08/21/2019   Procedure: ESOPHAGOGASTRODUODENOSCOPY (EGD) WITH PROPOFOL ;  Surgeon: Jinny Carmine, MD;  Location: [facility];  Service: Endoscopy;  Laterality: N/A;   POLYPECTOMY  08/21/2019   Procedure: POLYPECTOMY;  Surgeon: Jinny Carmine, MD;  Location: [facility];  Service: Endoscopy;;   SHOULDER SURGERY     TESTICLE SURGERY     age 34 or 63   TONSILLECTOMY     Patient Active Problem List   Diagnosis Date Noted   Encounter for screening colonoscopy    Polyp of transverse colon    Heartburn    Pure hypercholesterolemia 01/26/2016   Right lateral epicondylitis 07/07/2015   Vitamin D  deficiency 12/24/2014   Prediabetes 12/24/2014   Allergic rhinitis, seasonal 06/03/2014   Acid reflux 06/03/2014   Essential (primary) hypertension 06/03/2014   Family history of diabetes mellitus 06/03/2014   Family history of cardiac disorder 06/03/2014   Obesity (BMI 30.0-34.9) 06/03/2014    PCP:  Cathryne [name] Molt, MD  REFERRING PROVIDER: Mylene Gins, MD  REFERRING DIAGNOSIS:  G20.A1 (ICD-10-CM) - Parkinson's disease (HCC)    THERAPY DIAG: Difficulty in walking, not elsewhere classified  Unsteadiness on feet  Other abnormalities of gait and mobility  ONSET DATE: 02/2023  FOLLOW UP APPT WITH PROVIDER: Yes, f/u with referring provider in July 2025   RATIONALE FOR EVALUATION AND TREATMENT: Rehabilitation  Pertinent History Patient is a 63 year old male referred for gait/mobility deficits secondary to Parkinson's disease. Using amantadine for PD symptoms. Patient reports L side of his body is getting weaker. He reports he wants to be able to increase his activity. Pt reports comorbid low back pain and hip pain. Patient reports responding relatively well with monthly infusions of RA. Pt reports his LLE tends to drag - concerned for it catching on carpet.   Pain: Yes; multi-site associated with RA; low back and comorbid L knee pain.  Numbness/Tingling: No Focal Weakness: Yes; L-sided weakness, LE worse than UE Recent changes in overall health/medication: No Prior history of physical therapy for balance:  Yes; previous PT in [facility] outpatient clinic  Falls: Has patient fallen in last 6 months? No, Number of falls: N/A Directional pattern for falls: No Dominant hand: right Imaging: Yes ;  Normal brain MRI in 2023;   Prior level of function: Independent Occupational demands: IT trainer - M-F; p thas to hold  batteries, alternators, starters Hobbies: Traveling  Red flags (bowel/bladder changes, saddle paresthesia, personal history of cancer, h/o spinal tumors, h/o compression fx, h/o abdominal aneurysm, abdominal pain, chills/fever, night sweats, nausea, vomiting, unrelenting pain): Negative  Precautions: None  Weight Bearing Restrictions: No  Living Environment Lives with: lives with their spouse and mother  Lives in: House/apartment; one step to get into  home; stairs to get into home, pt has upstairs bedroom. Pt reports doing fairly well with stairs. Concrete from his car up to front steps  Has following equipment at home: None   Patient Goals: Improved mobility in L leg and L arm, improved strength in low back.     OBJECTIVE:   Patient Surveys  ABC: 91.3%  Cognition Patient is oriented to person, place, and time.  Recent memory is intact.  Remote memory is intact.  Attention span and concentration are intact.  Expressive speech is intact.  Patient's fund of knowledge is within normal limits for educational level.   Gross Musculoskeletal Assessment Tremor: No resting tremor  Bulk: Normal Tone: Cogwheel rigidity LE > UE. Hamstrings are spastic on L>R, Modified Ashworth 2 and 1, respectively.  GAIT: Distance walked: 80 ft Assistive device utilized: None Level of assistance: SBA Comments: Decreased arm swing, decreased trunk rotation with arm/leg swing, LLE decreased heel strike/mid-foot initial contact  Posture: Forward head, rounded shoulders   AROM  AROM (Normal range in degrees) AROM  04/22/2023  Lumbar   Flexion (65) 75%*  Extension (30) 50% no pain  Right lateral flexion (25) 75% (R-sided discomfort)  Left lateral flexion (25) 75% (R-sided discomfort)  Right rotation (30) 50%  Left rotation (30) 25%          Ankle    Dorsiflexion (20) WFL WFL  Plantarflexion (50)    Inversion (35)    Eversion (15    (* = pain; Blank rows = not tested)   LE MMT:  MMT (out of 5) Right 04/22/2023 Left 04/22/2023 Right 06/03/23 Left 06/03/23 Right 06/26/23 Left 06/26/23 Right 08/12/23 Left 08/12/23  Hip flexion 4- 3+ 5- 4- 5 4+ 4+ 4  Hip extension          Hip abduction (seated) 5 5 5 5 5 5 5 5   Hip adduction (seated) 5 5 5 5 5 5 5 5   Hip internal rotation          Hip external rotation          Knee flexion 5 4 5  4+ 5 5 5 5   Knee extension 5 4 5 4 5 5 5 5   Ankle dorsiflexion 5 4- 5 4- 5 4 5  4+  Ankle plantarflexion           Ankle inversion          Ankle eversion          (* = pain; Blank rows = not tested)   Coordination/Cerebellar Finger to Nose: L-sided hypometria  Heel to Shin: L-sided impairment  Rapid alternating movements: Impaired L>R, mild impairment LE toe tapping  Finger Opposition: WNL Pronator Drift: Positive LUE   FUNCTIONAL OUTCOME MEASURES (initial eval)   Results Comments  BERG 50/56 Fall risk, in need of intervention  DGI Next visit/24   TUG 13.6 seconds   5TSTS 35.2 seconds   (Blank rows = not tested)   Postural Control Screen Romberg: EO WNL, EC up to 30 sec with significant sway and near-LOB x 2   Lumbar Spine Screen (04/24/23) SLUMP: Negative SLR:  Negative Repeated extension in standing: increases, worse pain locally along low back    TODAY'S TREATMENT    SUBJECTIVE STATEMENT:   Patient reports that ESI did relieve his low back pain notably. He reports feeling is mildly now, but it is much better. Patient reports getting around well [place]. Pt reports more dragging with L foot/toe toward end of day with walking around conference. He feels that PT has helped significantly.     Therapeutic Exercise - improved strength as needed to improve performance of CKC activities/functional movements and as needed for power production to prevent fall during episode of large postural perturbation   NuStep; Level 5, x 5 minutes - for improved soft tissue mobility and increased tissue temperature to improve muscle performance   -interval subjective gathered during this time  *GOAL UPDATE PERFORMED  -remaining long-term strength goal (see MMT chart above)  UE MMT: grossly 4+/5 RUE, 4/5 LUE   PATIENT EDUCATION: Discussed current progress with PT, plan for transition to eval & treat for lumbar spine pending new referral upon completion of this episode of care. Reviewed upper limb exercises that can be used for home exercise; putty gripping (spherical and  pinch grip with pull apart), resisted shoulder elevation, and bicep curls. Discussed potential benefit of OT if fine motor tasks/fine movement control of hand/fingers was bigger concern for pt.    *not today* // bars: high knees with opposite knee touch, 5-lb ankle weights; 5x D/B    Neuromuscular Re-education - fall risk screening/management; for improved sensory integration, static and dynamic postural control, equilibrium and non-equilibrium coordination as needed for negotiating home and community environment and stepping over obstacles   Forward step up to hurdle stance, 6-inch step (staircase, center of gym); x 10 with each LE   In // bars:Tandem stance, on Airex pad; 2 x 30 sec bilat, intermittent RUE touch during second set   Standing rockerboard, double limb; x 15 (forward/backward)    *next visit* Forward step up to BOSU ball; x10 with each LLE leading; x 5 with RLE leading  -pain with unilateral loading on R side  Hurdle stepping: (3) 6-inch hurdles and (2) 12-inch hurdles; 4x D/B   *not today* Exaggerated arm and leg swing with 4-lb ankle weights; // bars; 5x D/B length of bars 6-inch step + Airex forward step up; 1 x 15 with each LE  Obstacle course: alternating cone tap (4 cones on R and L); (3) 6-inch hurdles; Airex step up/down x 3 pads; 5x D/B  Sit to stand with 4-lb Med ball overhead reach, standing on Airex; 2x10  Toe tapping on single cone; 2 x 10 alternating R/L  Forward march along blue agility ladder, exaggerated arm and leg swing; 5x D/B  Toe tapping on 6-inch step; 2x10    PATIENT EDUCATION:  Education details: see above for patient education details Person educated: Patient Education method: Explanation Education comprehension: verbalized understanding   HOME EXERCISE PROGRAM: Access Code: [email or web address] URL: [email or web address] Date: 06/26/2023 Prepared by: Venetia Endo  Exercises - Step Forward with Arms Reaching to Sides  - 2 x  daily - 7 x weekly - 2 sets - 10 reps - Step Sideways with Arms Reaching  - 2 x daily - 7 x weekly - 2 sets - 10 reps - Sit to Stand with Arm Reach  - 2 x daily - 7 x weekly - 2 sets - 8 reps - Heel Toe Raises with Counter Support  - 2 x daily - 7  x weekly - 2 sets - 10 reps - Runner's Step Up/Down  - 1 x daily - 4-5 x weekly - 2 sets - 10 reps - Standing Single Leg Stance with Counter Support  - 1 x daily - 4-5 x weekly - 3-4 sets - 10sec hold   Access Code: [email or web address] URL: [email or web address] Date: 04/24/2023 Prepared by: Venetia Endo  Exercises - Supine Lower Trunk Rotation  - 2 x daily - 7 x weekly - 2 sets - 10 reps - Supine Piriformis Stretch with Foot on Ground  - 2 x daily - 7 x weekly - 3 sets - 30sec hold - Seated Hamstring Stretch  - 2 x daily - 7 x weekly - 3 sets - 30sec hold   ASSESSMENT:  CLINICAL IMPRESSION: Patient has met almost all PT goals. He mainly has residual L-sided weakness primarily affecting L hip flexors and ankle dorsiflexors. He intermittently has diminished toe clearance of L foot during swing phase, and he is able to correct this with increased conscious effort to lift foot during each swing phase. New referral for low back pain has been received; today, we discussed transitioning to this issue during his second visit next week. Patient will benefit from skilled PT to address above impairments and improve overall function.  REHAB POTENTIAL: Good  CLINICAL DECISION MAKING: Evolving/moderate complexity  EVALUATION COMPLEXITY: High   GOALS: Goals reviewed with patient? Yes  SHORT TERM GOALS: Target date: 05/16/2023  Pt will be independent with HEP in order to improve strength and balance in order to decrease fall risk and improve function at home. Baseline: 04/22/23: Formal HEP to be issued on visit # 2.   04/24/23: HEP given for thoracolumbar mobility, reviewed baseline exercises for LE strengthening/fall risk.    06/03/23: Pt is 50%  compliant; he has difficulty completing on some days.   06/26/23: Dec compliance through work week; gets some exercise done each day.  08/12/23: Pt reports partial compliance while on recent vacation, pt reports at least every other day completing exercises over vacation.  Goal status: PARTIALLY MET    LONG TERM GOALS: Target date: 06/20/2023  Patient will improve MMT of tested LLE musculature to 4+/5 or greater indicative of improved strength as needed for power production to prevent fall and to improve performance of transfers and stair negotiation.  Baseline: 04/22/23: LLE gross strength 3+ to 4 (exception of seated hip ABD/ADD 5/5, not tested in standard testing position).     06/03/23: Modest improvement in strength, no change in L quads or dorsiflexors.   06/26/23: Met for all with exception of dorsiflexor weakness.   08/12/23: Met for all with exception of L hip flexion  Goal status: MOSTLY MET/IN PROGRESS   2.  Pt will improve BERG by at least 3 points in order to demonstrate clinically significant improvement in balance.   Baseline: 04/22/23: To be completed on visit # 2.    04/24/23: 50/56       06/03/23: 52/56      06/26/23: 54/56 Goal status: ACHIEVED  3. Pt will decrease 5TSTS by at least 3 seconds in order to demonstrate clinically significant improvement in LE strength      Baseline: 04/22/23: 35.2 sec     06/03/23: 14.3 sec Goal status: ACHIEVED   4. Pt will improve DGI by at least 3 points in order to demonstrate clinically significant improvement in balance and decreased risk for falls.     Baseline: 04/22/23: To be completed on visit # 3.  04/29/23: 15/24    06/03/23: 18/24    06/26/23: 21/24 Goal status: ACHIEVED   5. Pt will complete TUG in less than 11.5 sec indicative of decreased fall risk per clinical population Baseline: 04/22/23: 13.6 sec     06/03/23: 10.9 sec  Goal status: ACHIEVED    PLAN: PT FREQUENCY: 2x/week  PT DURATION: 3-4 weeks  PLANNED INTERVENTIONS: Therapeutic  exercises, Therapeutic activity, Neuromuscular re-education, Balance training, Gait training, Patient/Family education, Joint manipulation, Joint mobilization, Canalith repositioning, Aquatic Therapy, Dry Needling, Cognitive remediation, Electrical stimulation, Spinal manipulation, Spinal mobilization, Cryotherapy, Moist heat, Traction, Ultrasound, Ionotophoresis 4mg /ml Dexamethasone, and Manual therapy  PLAN FOR NEXT SESSION: Continue with LE strengthening, LLE movement control and proprioceptive/kinesthetic awareness. Transition to evaluation/treatment for lumbar spine with new episode of care on 08/21/23 visit.     Venetia Endo, PT, DPT #[id number]  Venetia ONEIDA Endo 08/12/2023, 9:43 AM

## 2023-08-14 ENCOUNTER — Ambulatory Visit: Attending: Physical Medicine & Rehabilitation | Admitting: Physical Therapy

## 2023-08-14 DIAGNOSIS — M47816 Spondylosis without myelopathy or radiculopathy, lumbar region: Secondary | ICD-10-CM | POA: Insufficient documentation

## 2023-08-14 DIAGNOSIS — M5459 Other low back pain: Secondary | ICD-10-CM | POA: Diagnosis present

## 2023-08-14 DIAGNOSIS — R262 Difficulty in walking, not elsewhere classified: Secondary | ICD-10-CM | POA: Insufficient documentation

## 2023-08-14 DIAGNOSIS — R278 Other lack of coordination: Secondary | ICD-10-CM | POA: Insufficient documentation

## 2023-08-14 DIAGNOSIS — R2689 Other abnormalities of gait and mobility: Secondary | ICD-10-CM | POA: Diagnosis present

## 2023-08-14 DIAGNOSIS — R2681 Unsteadiness on feet: Secondary | ICD-10-CM | POA: Insufficient documentation

## 2023-08-14 DIAGNOSIS — M6281 Muscle weakness (generalized): Secondary | ICD-10-CM | POA: Insufficient documentation

## 2023-08-14 NOTE — Therapy (Signed)
 OUTPATIENT PHYSICAL THERAPY TREATMENT   Patient Name: Tyler Glover MRN: [id number] DOB:23-Jul-1960, 63 y.o., male Today's Date: 08/14/2023    PT End of Session - 08/14/23 0948     Visit Number 19    Number of Visits 20    Date for PT Re-Evaluation 06/20/23    PT Start Time 0948    PT Stop Time 1029    PT Time Calculation (min) 41 min    Equipment Utilized During Treatment Gait belt    Activity Tolerance Patient tolerated treatment well    Behavior During Therapy WFL for tasks assessed/performed              Past Medical History:  Diagnosis Date   Allergy    Arthritis    Rheumatoid   GERD (gastroesophageal reflux disease)    Hypertension    Past Surgical History:  Procedure Laterality Date   COLONOSCOPY     COLONOSCOPY WITH PROPOFOL  N/A 08/21/2019   Procedure: COLONOSCOPY WITH PROPOFOL ;  Surgeon: Jinny Carmine, MD;  Location: [facility];  Service: Endoscopy;  Laterality: N/A;  priority 4   ESOPHAGOGASTRODUODENOSCOPY (EGD) WITH PROPOFOL  N/A 08/21/2019   Procedure: ESOPHAGOGASTRODUODENOSCOPY (EGD) WITH PROPOFOL ;  Surgeon: Jinny Carmine, MD;  Location: [facility];  Service: Endoscopy;  Laterality: N/A;   POLYPECTOMY  08/21/2019   Procedure: POLYPECTOMY;  Surgeon: Jinny Carmine, MD;  Location: [facility];  Service: Endoscopy;;   SHOULDER SURGERY     TESTICLE SURGERY     age 45 or 34   TONSILLECTOMY     Patient Active Problem List   Diagnosis Date Noted   Encounter for screening colonoscopy    Polyp of transverse colon    Heartburn    Pure hypercholesterolemia 01/26/2016   Right lateral epicondylitis 07/07/2015   Vitamin D  deficiency 12/24/2014   Prediabetes 12/24/2014   Allergic rhinitis, seasonal 06/03/2014   Acid reflux 06/03/2014   Essential (primary) hypertension 06/03/2014   Family history of diabetes mellitus 06/03/2014   Family history of cardiac disorder 06/03/2014   Obesity (BMI 30.0-34.9) 06/03/2014    PCP: Cathryne [name] Molt,  MD  REFERRING PROVIDER: Mylene Gins, MD  REFERRING DIAGNOSIS:  G20.A1 (ICD-10-CM) - Parkinson's disease (HCC)    THERAPY DIAG: Difficulty in walking, not elsewhere classified  Unsteadiness on feet  Other abnormalities of gait and mobility  ONSET DATE: 02/2023  FOLLOW UP APPT WITH PROVIDER: Yes, f/u with referring provider in July 2025   RATIONALE FOR EVALUATION AND TREATMENT: Rehabilitation  Pertinent History Patient is a 63 year old male referred for gait/mobility deficits secondary to Parkinson's disease. Using amantadine for PD symptoms. Patient reports L side of his body is getting weaker. He reports he wants to be able to increase his activity. Pt reports comorbid low back pain and hip pain. Patient reports responding relatively well with monthly infusions of RA. Pt reports his LLE tends to drag - concerned for it catching on carpet.   Pain: Yes; multi-site associated with RA; low back and comorbid L knee pain.  Numbness/Tingling: No Focal Weakness: Yes; L-sided weakness, LE worse than UE Recent changes in overall health/medication: No Prior history of physical therapy for balance:  Yes; previous PT in [facility] outpatient clinic  Falls: Has patient fallen in last 6 months? No, Number of falls: N/A Directional pattern for falls: No Dominant hand: right Imaging: Yes ;  Normal brain MRI in 2023;   Prior level of function: Independent Occupational demands: IT trainer - M-F; p thas to hold batteries,  alternators, starters Hobbies: Traveling  Red flags (bowel/bladder changes, saddle paresthesia, personal history of cancer, h/o spinal tumors, h/o compression fx, h/o abdominal aneurysm, abdominal pain, chills/fever, night sweats, nausea, vomiting, unrelenting pain): Negative  Precautions: None  Weight Bearing Restrictions: No  Living Environment Lives with: lives with their spouse and mother  Lives in: House/apartment; one step to get into home; stairs to get  into home, pt has upstairs bedroom. Pt reports doing fairly well with stairs. Concrete from his car up to front steps  Has following equipment at home: None   Patient Goals: Improved mobility in L leg and L arm, improved strength in low back.     OBJECTIVE:   Patient Surveys  ABC: 91.3%  Cognition Patient is oriented to person, place, and time.  Recent memory is intact.  Remote memory is intact.  Attention span and concentration are intact.  Expressive speech is intact.  Patient's fund of knowledge is within normal limits for educational level.   Gross Musculoskeletal Assessment Tremor: No resting tremor  Bulk: Normal Tone: Cogwheel rigidity LE > UE. Hamstrings are spastic on L>R, Modified Ashworth 2 and 1, respectively.  GAIT: Distance walked: 80 ft Assistive device utilized: None Level of assistance: SBA Comments: Decreased arm swing, decreased trunk rotation with arm/leg swing, LLE decreased heel strike/mid-foot initial contact  Posture: Forward head, rounded shoulders   AROM  AROM (Normal range in degrees) AROM  04/22/2023  Lumbar   Flexion (65) 75%*  Extension (30) 50% no pain  Right lateral flexion (25) 75% (R-sided discomfort)  Left lateral flexion (25) 75% (R-sided discomfort)  Right rotation (30) 50%  Left rotation (30) 25%          Ankle    Dorsiflexion (20) WFL WFL  Plantarflexion (50)    Inversion (35)    Eversion (15    (* = pain; Blank rows = not tested)   LE MMT:  MMT (out of 5) Right 04/22/2023 Left 04/22/2023 Right 06/03/23 Left 06/03/23 Right 06/26/23 Left 06/26/23 Right 08/12/23 Left 08/12/23  Hip flexion 4- 3+ 5- 4- 5 4+ 4+ 4  Hip extension          Hip abduction (seated) 5 5 5 5 5 5 5 5   Hip adduction (seated) 5 5 5 5 5 5 5 5   Hip internal rotation          Hip external rotation          Knee flexion 5 4 5  4+ 5 5 5 5   Knee extension 5 4 5 4 5 5 5 5   Ankle dorsiflexion 5 4- 5 4- 5 4 5  4+  Ankle plantarflexion          Ankle  inversion          Ankle eversion          (* = pain; Blank rows = not tested)   Coordination/Cerebellar Finger to Nose: L-sided hypometria  Heel to Shin: L-sided impairment  Rapid alternating movements: Impaired L>R, mild impairment LE toe tapping  Finger Opposition: WNL Pronator Drift: Positive LUE   FUNCTIONAL OUTCOME MEASURES (initial eval)   Results Comments  BERG 50/56 Fall risk, in need of intervention  DGI Next visit/24   TUG 13.6 seconds   5TSTS 35.2 seconds   (Blank rows = not tested)   Postural Control Screen Romberg: EO WNL, EC up to 30 sec with significant sway and near-LOB x 2   Lumbar Spine Screen (04/24/23) SLUMP: Negative SLR: Negative  Repeated extension in standing: increases, worse pain locally along low back    TODAY'S TREATMENT    SUBJECTIVE STATEMENT:   Patient reports doing well recently with gait/balance and reports some challenge with dragging LLE intermittently; he has to be more conscious of picking up L foot during swing phase.     Therapeutic Exercise - improved strength as needed to improve performance of CKC activities/functional movements and as needed for power production to prevent fall during episode of large postural perturbation   NuStep; Level 6, x 5 minutes - for improved soft tissue mobility and increased tissue temperature to improve muscle performance   -interval subjective gathered during this time   PATIENT EDUCATION: Discussed continued POC with focus on gait/balance and transitioning focus next Wednesday to low back eval & treat.    *not today* // bars: high knees with opposite knee touch, 5-lb ankle weights; 5x D/B    Neuromuscular Re-education - fall risk screening/management; for improved sensory integration, static and dynamic postural control, equilibrium and non-equilibrium coordination as needed for negotiating home and community environment and stepping over obstacles   Forward step up to hurdle stance,  6-inch step (staircase, center of gym); x 10 with each LE   In // bars:Tandem stance, on Airex pad; 2 x 30 sec bilat, intermittent RUE touch during second set   Standing rockerboard, double limb; x 15 (forward/backward)  Forward step up to BOSU ball; x10 with each LLE leading; x 10 with RLE leading   Obstacle course: (3) 6-inch hurdles and (2) 12-inch hurdles, 6-inch step-up and down, (3) 4x1 boards, and step over long Airex pad; 3x D/B    Feet together with perturbations, on Airex pad; 2 x 1-minute bouts    *not today* Exaggerated arm and leg swing with 4-lb ankle weights; // bars; 5x D/B length of bars 6-inch step + Airex forward step up; 1 x 15 with each LE  Obstacle course: alternating cone tap (4 cones on R and L); (3) 6-inch hurdles; Airex step up/down x 3 pads; 5x D/B  Sit to stand with 4-lb Med ball overhead reach, standing on Airex; 2x10  Toe tapping on single cone; 2 x 10 alternating R/L  Forward march along blue agility ladder, exaggerated arm and leg swing; 5x D/B  Toe tapping on 6-inch step; 2x10    PATIENT EDUCATION:  Education details: see above for patient education details Person educated: Patient Education method: Explanation Education comprehension: verbalized understanding   HOME EXERCISE PROGRAM: Access Code: [email or web address] URL: [email or web address] Date: 06/26/2023 Prepared by: Venetia Endo  Exercises - Step Forward with Arms Reaching to Sides  - 2 x daily - 7 x weekly - 2 sets - 10 reps - Step Sideways with Arms Reaching  - 2 x daily - 7 x weekly - 2 sets - 10 reps - Sit to Stand with Arm Reach  - 2 x daily - 7 x weekly - 2 sets - 8 reps - Heel Toe Raises with Counter Support  - 2 x daily - 7 x weekly - 2 sets - 10 reps - Runner's Step Up/Down  - 1 x daily - 4-5 x weekly - 2 sets - 10 reps - Standing Single Leg Stance with Counter Support  - 1 x daily - 4-5 x weekly - 3-4 sets - 10sec hold   Access Code: [email or web address] URL:  [email or web address] Date: 04/24/2023 Prepared by: Venetia Endo  Exercises - Supine Lower Trunk Rotation  - 2 x daily - 7 x weekly -  2 sets - 10 reps - Supine Piriformis Stretch with Foot on Ground  - 2 x daily - 7 x weekly - 3 sets - 30sec hold - Seated Hamstring Stretch  - 2 x daily - 7 x weekly - 3 sets - 30sec hold   ASSESSMENT:  CLINICAL IMPRESSION: Patient has made excellent progress with this episode of care and has met most goals related to fall risk/balance. We worked on challenging ankle dorsiflexors functionally with postural control drill including mult-directional perturbations; pt is most challenged with posterior perturbations. Pt performs well with obstacle negotiation tasks, but he still reports some challenge with lifting LLE during swing phase of gait and obstacle clearance. Patient will benefit from skilled PT to address above impairments and improve overall function.  REHAB POTENTIAL: Good  CLINICAL DECISION MAKING: Evolving/moderate complexity  EVALUATION COMPLEXITY: High   GOALS: Goals reviewed with patient? Yes  SHORT TERM GOALS: Target date: 05/16/2023  Pt will be independent with HEP in order to improve strength and balance in order to decrease fall risk and improve function at home. Baseline: 04/22/23: Formal HEP to be issued on visit # 2.   04/24/23: HEP given for thoracolumbar mobility, reviewed baseline exercises for LE strengthening/fall risk.    06/03/23: Pt is 50% compliant; he has difficulty completing on some days.   06/26/23: Dec compliance through work week; gets some exercise done each day.  08/12/23: Pt reports partial compliance while on recent vacation, pt reports at least every other day completing exercises over vacation.  Goal status: PARTIALLY MET    LONG TERM GOALS: Target date: 06/20/2023  Patient will improve MMT of tested LLE musculature to 4+/5 or greater indicative of improved strength as needed for power production to  prevent fall and to improve performance of transfers and stair negotiation.  Baseline: 04/22/23: LLE gross strength 3+ to 4 (exception of seated hip ABD/ADD 5/5, not tested in standard testing position).     06/03/23: Modest improvement in strength, no change in L quads or dorsiflexors.   06/26/23: Met for all with exception of dorsiflexor weakness.   08/12/23: Met for all with exception of L hip flexion  Goal status: MOSTLY MET/IN PROGRESS   2.  Pt will improve BERG by at least 3 points in order to demonstrate clinically significant improvement in balance.   Baseline: 04/22/23: To be completed on visit # 2.    04/24/23: 50/56       06/03/23: 52/56      06/26/23: 54/56 Goal status: ACHIEVED  3. Pt will decrease 5TSTS by at least 3 seconds in order to demonstrate clinically significant improvement in LE strength      Baseline: 04/22/23: 35.2 sec     06/03/23: 14.3 sec Goal status: ACHIEVED   4. Pt will improve DGI by at least 3 points in order to demonstrate clinically significant improvement in balance and decreased risk for falls.     Baseline: 04/22/23: To be completed on visit # 3.  04/29/23: 15/24    06/03/23: 18/24    06/26/23: 21/24 Goal status: ACHIEVED   5. Pt will complete TUG in less than 11.5 sec indicative of decreased fall risk per clinical population Baseline: 04/22/23: 13.6 sec     06/03/23: 10.9 sec  Goal status: ACHIEVED    PLAN: PT FREQUENCY: 2x/week  PT DURATION: 3-4 weeks  PLANNED INTERVENTIONS: Therapeutic exercises, Therapeutic activity, Neuromuscular re-education, Balance training, Gait training, Patient/Family education, Joint manipulation, Joint mobilization, Canalith repositioning, Aquatic Therapy, Dry Needling, Cognitive  remediation, Electrical stimulation, Spinal manipulation, Spinal mobilization, Cryotherapy, Moist heat, Traction, Ultrasound, Ionotophoresis 4mg /ml Dexamethasone, and Manual therapy  PLAN FOR NEXT SESSION: Continue with LE strengthening, LLE movement control  and proprioceptive/kinesthetic awareness. Transition to evaluation/treatment for lumbar spine with new episode of care on 08/21/23 visit.     Venetia Endo, PT, DPT #[id number]  Venetia ONEIDA Endo 08/14/2023, 9:48 AM

## 2023-08-18 NOTE — Therapy (Incomplete)
 OUTPATIENT PHYSICAL THERAPY TREATMENT   Patient Name: Tyler Glover MRN: [id number] DOB:10-Apr-1960, 63 y.o., male Today's Date: 08/18/2023          Past Medical History:  Diagnosis Date   Allergy    Arthritis    Rheumatoid   GERD (gastroesophageal reflux disease)    Hypertension    Past Surgical History:  Procedure Laterality Date   COLONOSCOPY     COLONOSCOPY WITH PROPOFOL  N/A 08/21/2019   Procedure: COLONOSCOPY WITH PROPOFOL ;  Surgeon: Jinny Carmine, MD;  Location: [facility];  Service: Endoscopy;  Laterality: N/A;  priority 4   ESOPHAGOGASTRODUODENOSCOPY (EGD) WITH PROPOFOL  N/A 08/21/2019   Procedure: ESOPHAGOGASTRODUODENOSCOPY (EGD) WITH PROPOFOL ;  Surgeon: Jinny Carmine, MD;  Location: [facility];  Service: Endoscopy;  Laterality: N/A;   POLYPECTOMY  08/21/2019   Procedure: POLYPECTOMY;  Surgeon: Jinny Carmine, MD;  Location: [facility];  Service: Endoscopy;;   SHOULDER SURGERY     TESTICLE SURGERY     age 38 or 64   TONSILLECTOMY     Patient Active Problem List   Diagnosis Date Noted   Encounter for screening colonoscopy    Polyp of transverse colon    Heartburn    Pure hypercholesterolemia 01/26/2016   Right lateral epicondylitis 07/07/2015   Vitamin D  deficiency 12/24/2014   Prediabetes 12/24/2014   Allergic rhinitis, seasonal 06/03/2014   Acid reflux 06/03/2014   Essential (primary) hypertension 06/03/2014   Family history of diabetes mellitus 06/03/2014   Family history of cardiac disorder 06/03/2014   Obesity (BMI 30.0-34.9) 06/03/2014    PCP: Cathryne [name] Molt, MD  REFERRING PROVIDER: Mylene Gins, MD  REFERRING DIAGNOSIS:  G20.A1 (ICD-10-CM) - Parkinson's disease (HCC)    THERAPY DIAG: Difficulty in walking, not elsewhere classified  Unsteadiness on feet  Other abnormalities of gait and mobility  Other lack of coordination  Muscle weakness (generalized)  ONSET DATE: 02/2023  FOLLOW UP APPT WITH PROVIDER: Yes,  f/u with referring provider in July 2025   RATIONALE FOR EVALUATION AND TREATMENT: Rehabilitation  Pertinent History Patient is a 63 year old male referred for gait/mobility deficits secondary to Parkinson's disease. Using amantadine for PD symptoms. Patient reports L side of his body is getting weaker. He reports he wants to be able to increase his activity. Pt reports comorbid low back pain and hip pain. Patient reports responding relatively well with monthly infusions of RA. Pt reports his LLE tends to drag - concerned for it catching on carpet.   Pain: Yes; multi-site associated with RA; low back and comorbid L knee pain.  Numbness/Tingling: No Focal Weakness: Yes; L-sided weakness, LE worse than UE Recent changes in overall health/medication: No Prior history of physical therapy for balance:  Yes; previous PT in [facility] outpatient clinic  Falls: Has patient fallen in last 6 months? No, Number of falls: N/A Directional pattern for falls: No Dominant hand: right Imaging: Yes ;  Normal brain MRI in 2023;   Prior level of function: Independent Occupational demands: IT trainer - M-F; p thas to hold batteries, alternators, starters Hobbies: Traveling  Red flags (bowel/bladder changes, saddle paresthesia, personal history of cancer, h/o spinal tumors, h/o compression fx, h/o abdominal aneurysm, abdominal pain, chills/fever, night sweats, nausea, vomiting, unrelenting pain): Negative  Precautions: None  Weight Bearing Restrictions: No  Living Environment Lives with: lives with their spouse and mother  Lives in: House/apartment; one step to get into home; stairs to get into home, pt has upstairs bedroom. Pt reports doing fairly well with  stairs. Concrete from his car up to front steps  Has following equipment at home: None   Patient Goals: Improved mobility in L leg and L arm, improved strength in low back.     OBJECTIVE:   Patient Surveys  ABC:  91.3%  Cognition Patient is oriented to person, place, and time.  Recent memory is intact.  Remote memory is intact.  Attention span and concentration are intact.  Expressive speech is intact.  Patient's fund of knowledge is within normal limits for educational level.   Gross Musculoskeletal Assessment Tremor: No resting tremor  Bulk: Normal Tone: Cogwheel rigidity LE > UE. Hamstrings are spastic on L>R, Modified Ashworth 2 and 1, respectively.  GAIT: Distance walked: 80 ft Assistive device utilized: None Level of assistance: SBA Comments: Decreased arm swing, decreased trunk rotation with arm/leg swing, LLE decreased heel strike/mid-foot initial contact  Posture: Forward head, rounded shoulders   AROM  AROM (Normal range in degrees) AROM  04/22/2023  Lumbar   Flexion (65) 75%*  Extension (30) 50% no pain  Right lateral flexion (25) 75% (R-sided discomfort)  Left lateral flexion (25) 75% (R-sided discomfort)  Right rotation (30) 50%  Left rotation (30) 25%          Ankle    Dorsiflexion (20) WFL WFL  Plantarflexion (50)    Inversion (35)    Eversion (15    (* = pain; Blank rows = not tested)   LE MMT:  MMT (out of 5) Right 04/22/2023 Left 04/22/2023 Right 06/03/23 Left 06/03/23 Right 06/26/23 Left 06/26/23 Right 08/12/23 Left 08/12/23  Hip flexion 4- 3+ 5- 4- 5 4+ 4+ 4  Hip extension          Hip abduction (seated) 5 5 5 5 5 5 5 5   Hip adduction (seated) 5 5 5 5 5 5 5 5   Hip internal rotation          Hip external rotation          Knee flexion 5 4 5  4+ 5 5 5 5   Knee extension 5 4 5 4 5 5 5 5   Ankle dorsiflexion 5 4- 5 4- 5 4 5  4+  Ankle plantarflexion          Ankle inversion          Ankle eversion          (* = pain; Blank rows = not tested)   Coordination/Cerebellar Finger to Nose: L-sided hypometria  Heel to Shin: L-sided impairment  Rapid alternating movements: Impaired L>R, mild impairment LE toe tapping  Finger Opposition: WNL Pronator Drift:  Positive LUE   FUNCTIONAL OUTCOME MEASURES (initial eval)   Results Comments  BERG 50/56 Fall risk, in need of intervention  DGI Next visit/24   TUG 13.6 seconds   5TSTS 35.2 seconds   (Blank rows = not tested)   Postural Control Screen Romberg: EO WNL, EC up to 30 sec with significant sway and near-LOB x 2   Lumbar Spine Screen (04/24/23) SLUMP: Negative SLR: Negative Repeated extension in standing: increases, worse pain locally along low back    TODAY'S TREATMENT: 08/18/23  ***  SUBJECTIVE STATEMENT:   Patient reports that ESI did relieve his low back pain notably. He reports feeling is mildly now, but it is much better. Patient reports getting around well [place]. Pt reports more dragging with L foot/toe toward end of day with walking around conference. He feels that PT has helped significantly.  Therapeutic Exercise - improved strength as needed to improve performance of CKC activities/functional movements and as needed for power production to prevent fall during episode of large postural perturbation   NuStep; Level 6, x 5 minutes - for improved soft tissue mobility and increased tissue temperature to improve muscle performance   -interval subjective gathered during this time   PATIENT EDUCATION: Discussed current progress with PT, plan for transition to eval & treat for lumbar spine pending new referral upon completion of this episode of care. Reviewed upper limb exercises that can be used for home exercise; putty gripping (spherical and pinch grip with pull apart), resisted shoulder elevation, and bicep curls. Discussed potential benefit of OT if fine motor tasks/fine movement control of hand/fingers was bigger concern for pt.    *not today* // bars: high knees with opposite knee touch, 5-lb ankle weights; 5x D/B    Neuromuscular Re-education - fall risk screening/management; for improved sensory integration, static and dynamic postural  control, equilibrium and non-equilibrium coordination as needed for negotiating home and community environment and stepping over obstacles   Forward step up to hurdle stance, 6-inch step (staircase, center of gym); x 10 with each LE   In // bars:Tandem stance, on Airex pad; 2 x 30 sec bilat, intermittent RUE touch during second set   Standing rockerboard, double limb; x 15 (forward/backward)  Forward step up to BOSU ball; x10 with each LLE leading; x 10 with RLE leading   Obstacle course: (3) 6-inch hurdles and (2) 12-inch hurdles, 6-inch step-up and down, (3) 4x1 boards, and step over long Airex pad; 3x D/B    Feet together with perturbations, on Airex pad; 2 x 1-minute bouts   *not today* Exaggerated arm and leg swing with 4-lb ankle weights; // bars; 5x D/B length of bars 6-inch step + Airex forward step up; 1 x 15 with each LE  Obstacle course: alternating cone tap (4 cones on R and L); (3) 6-inch hurdles; Airex step up/down x 3 pads; 5x D/B  Sit to stand with 4-lb Med ball overhead reach, standing on Airex; 2x10  Toe tapping on single cone; 2 x 10 alternating R/L  Forward march along blue agility ladder, exaggerated arm and leg swing; 5x D/B  Toe tapping on 6-inch step; 2x10    PATIENT EDUCATION:  Education details: see above for patient education details Person educated: Patient Education method: Explanation Education comprehension: verbalized understanding   HOME EXERCISE PROGRAM: Access Code: [email or web address] URL: [email or web address] Date: 06/26/2023 Prepared by: Venetia Endo  Exercises - Step Forward with Arms Reaching to Sides  - 2 x daily - 7 x weekly - 2 sets - 10 reps - Step Sideways with Arms Reaching  - 2 x daily - 7 x weekly - 2 sets - 10 reps - Sit to Stand with Arm Reach  - 2 x daily - 7 x weekly - 2 sets - 8 reps - Heel Toe Raises with Counter Support  - 2 x daily - 7 x weekly - 2 sets - 10 reps - Runner's Step Up/Down  - 1 x daily - 4-5 x  weekly - 2 sets - 10 reps - Standing Single Leg Stance with Counter Support  - 1 x daily - 4-5 x weekly - 3-4 sets - 10sec hold   Access Code: [email or web address] URL: [email or web address] Date: 04/24/2023 Prepared by: Venetia Endo  Exercises - Supine Lower Trunk Rotation  - 2 x daily - 7 x weekly - 2 sets - 10 reps -  Supine Piriformis Stretch with Foot on Ground  - 2 x daily - 7 x weekly - 3 sets - 30sec hold - Seated Hamstring Stretch  - 2 x daily - 7 x weekly - 3 sets - 30sec hold   ASSESSMENT:  CLINICAL IMPRESSION: ***  Patient has met almost all PT goals. He mainly has residual L-sided weakness primarily affecting L hip flexors and ankle dorsiflexors. He intermittently has diminished toe clearance of L foot during swing phase, and he is able to correct this with increased conscious effort to lift foot during each swing phase. New referral for low back pain has been received; today, we discussed transitioning to this issue during his second visit next week. Patient will benefit from skilled PT to address above impairments and improve overall function.  REHAB POTENTIAL: Good  CLINICAL DECISION MAKING: Evolving/moderate complexity  EVALUATION COMPLEXITY: High   GOALS: Goals reviewed with patient? Yes  SHORT TERM GOALS: Target date: 05/16/2023  Pt will be independent with HEP in order to improve strength and balance in order to decrease fall risk and improve function at home. Baseline: 04/22/23: Formal HEP to be issued on visit # 2.   04/24/23: HEP given for thoracolumbar mobility, reviewed baseline exercises for LE strengthening/fall risk.    06/03/23: Pt is 50% compliant; he has difficulty completing on some days.   06/26/23: Dec compliance through work week; gets some exercise done each day.  08/12/23: Pt reports partial compliance while on recent vacation, pt reports at least every other day completing exercises over vacation.  Goal status: PARTIALLY MET    LONG TERM  GOALS: Target date: 06/20/2023  Patient will improve MMT of tested LLE musculature to 4+/5 or greater indicative of improved strength as needed for power production to prevent fall and to improve performance of transfers and stair negotiation.  Baseline: 04/22/23: LLE gross strength 3+ to 4 (exception of seated hip ABD/ADD 5/5, not tested in standard testing position).     06/03/23: Modest improvement in strength, no change in L quads or dorsiflexors.   06/26/23: Met for all with exception of dorsiflexor weakness.   08/12/23: Met for all with exception of L hip flexion  Goal status: MOSTLY MET/IN PROGRESS   2.  Pt will improve BERG by at least 3 points in order to demonstrate clinically significant improvement in balance.   Baseline: 04/22/23: To be completed on visit # 2.    04/24/23: 50/56       06/03/23: 52/56      06/26/23: 54/56 Goal status: ACHIEVED  3. Pt will decrease 5TSTS by at least 3 seconds in order to demonstrate clinically significant improvement in LE strength      Baseline: 04/22/23: 35.2 sec     06/03/23: 14.3 sec Goal status: ACHIEVED   4. Pt will improve DGI by at least 3 points in order to demonstrate clinically significant improvement in balance and decreased risk for falls.     Baseline: 04/22/23: To be completed on visit # 3.  04/29/23: 15/24    06/03/23: 18/24    06/26/23: 21/24 Goal status: ACHIEVED   5. Pt will complete TUG in less than 11.5 sec indicative of decreased fall risk per clinical population Baseline: 04/22/23: 13.6 sec     06/03/23: 10.9 sec  Goal status: ACHIEVED    PLAN: PT FREQUENCY: 2x/week  PT DURATION: 3-4 weeks  PLANNED INTERVENTIONS: Therapeutic exercises, Therapeutic activity, Neuromuscular re-education, Balance training, Gait training, Patient/Family education, Joint manipulation, Joint mobilization, Canalith repositioning,  Aquatic Therapy, Dry Needling, Cognitive remediation, Electrical stimulation, Spinal manipulation, Spinal mobilization, Cryotherapy,  Moist heat, Traction, Ultrasound, Ionotophoresis 4mg /ml Dexamethasone, and Manual therapy  PLAN FOR NEXT SESSION: Continue with LE strengthening, LLE movement control and proprioceptive/kinesthetic awareness. Transition to evaluation/treatment for lumbar spine with new episode of care on 08/21/23 visit.     Maryanne Finder, PT, DPT Physical Therapist - [place]  [facility]  Quenesha Douglass A Teshaun Olarte 08/18/2023, 3:17 PM

## 2023-08-19 ENCOUNTER — Ambulatory Visit

## 2023-08-19 ENCOUNTER — Encounter: Payer: Self-pay | Admitting: Physical Therapy

## 2023-08-19 DIAGNOSIS — R278 Other lack of coordination: Secondary | ICD-10-CM

## 2023-08-19 DIAGNOSIS — R2689 Other abnormalities of gait and mobility: Secondary | ICD-10-CM

## 2023-08-19 DIAGNOSIS — M6281 Muscle weakness (generalized): Secondary | ICD-10-CM

## 2023-08-19 DIAGNOSIS — R262 Difficulty in walking, not elsewhere classified: Secondary | ICD-10-CM

## 2023-08-19 DIAGNOSIS — R2681 Unsteadiness on feet: Secondary | ICD-10-CM

## 2023-08-19 NOTE — Therapy (Signed)
 OUTPATIENT PHYSICAL THERAPY TREATMENT   Patient Name: Tyler Glover MRN: [id number] DOB:March 03, 1960, 63 y.o., male Today's Date: 08/19/2023    PT End of Session - 08/19/23 0947     Visit Number 20    Number of Visits 20    Date for PT Re-Evaluation 06/20/23    PT Start Time 0947    Equipment Utilized During Treatment Gait belt    Activity Tolerance Patient tolerated treatment well    Behavior During Therapy [name] for tasks assessed/performed              Past Medical History:  Diagnosis Date   Allergy    Arthritis    Rheumatoid   GERD (gastroesophageal reflux disease)    Hypertension    Past Surgical History:  Procedure Laterality Date   COLONOSCOPY     COLONOSCOPY WITH PROPOFOL  N/A 08/21/2019   Procedure: COLONOSCOPY WITH PROPOFOL ;  Surgeon: Jinny Carmine, MD;  Location: [facility];  Service: Endoscopy;  Laterality: N/A;  priority 4   ESOPHAGOGASTRODUODENOSCOPY (EGD) WITH PROPOFOL  N/A 08/21/2019   Procedure: ESOPHAGOGASTRODUODENOSCOPY (EGD) WITH PROPOFOL ;  Surgeon: Jinny Carmine, MD;  Location: [facility];  Service: Endoscopy;  Laterality: N/A;   POLYPECTOMY  08/21/2019   Procedure: POLYPECTOMY;  Surgeon: Jinny Carmine, MD;  Location: [facility];  Service: Endoscopy;;   SHOULDER SURGERY     TESTICLE SURGERY     age 61 or 42   TONSILLECTOMY     Patient Active Problem List   Diagnosis Date Noted   Encounter for screening colonoscopy    Polyp of transverse colon    Heartburn    Pure hypercholesterolemia 01/26/2016   Right lateral epicondylitis 07/07/2015   Vitamin D  deficiency 12/24/2014   Prediabetes 12/24/2014   Allergic rhinitis, seasonal 06/03/2014   Acid reflux 06/03/2014   Essential (primary) hypertension 06/03/2014   Family history of diabetes mellitus 06/03/2014   Family history of cardiac disorder 06/03/2014   Obesity (BMI 30.0-34.9) 06/03/2014    PCP: Cathryne [name] Molt, MD  REFERRING PROVIDER: Mylene Gins, MD  REFERRING  DIAGNOSIS:  G20.A1 (ICD-10-CM) - Parkinson's disease (HCC)    THERAPY DIAG: Difficulty in walking, not elsewhere classified  Unsteadiness on feet  Other abnormalities of gait and mobility  Other lack of coordination  Muscle weakness (generalized)  ONSET DATE: 02/2023  FOLLOW UP APPT WITH PROVIDER: Yes, f/u with referring provider in July 2025   RATIONALE FOR EVALUATION AND TREATMENT: Rehabilitation  Pertinent History Patient is a 63 year old male referred for gait/mobility deficits secondary to Parkinson's disease. Using amantadine for PD symptoms. Patient reports L side of his body is getting weaker. He reports he wants to be able to increase his activity. Pt reports comorbid low back pain and hip pain. Patient reports responding relatively well with monthly infusions of RA. Pt reports his LLE tends to drag - concerned for it catching on carpet.   Pain: Yes; multi-site associated with RA; low back and comorbid L knee pain.  Numbness/Tingling: No Focal Weakness: Yes; L-sided weakness, LE worse than UE Recent changes in overall health/medication: No Prior history of physical therapy for balance:  Yes; previous PT in [facility] outpatient clinic  Falls: Has patient fallen in last 6 months? No, Number of falls: N/A Directional pattern for falls: No Dominant hand: right Imaging: Yes ;  Normal brain MRI in 2023;   Prior level of function: Independent Occupational demands: IT trainer - M-F; p thas to hold batteries, alternators, starters Hobbies: Traveling  Red flags (  bowel/bladder changes, saddle paresthesia, personal history of cancer, h/o spinal tumors, h/o compression fx, h/o abdominal aneurysm, abdominal pain, chills/fever, night sweats, nausea, vomiting, unrelenting pain): Negative  Precautions: None  Weight Bearing Restrictions: No  Living Environment Lives with: lives with their spouse and mother  Lives in: House/apartment; one step to get into home; stairs to  get into home, pt has upstairs bedroom. Pt reports doing fairly well with stairs. Concrete from his car up to front steps  Has following equipment at home: None   Patient Goals: Improved mobility in L leg and L arm, improved strength in low back.     OBJECTIVE:   Patient Surveys  ABC: 91.3%  Cognition Patient is oriented to person, place, and time.  Recent memory is intact.  Remote memory is intact.  Attention span and concentration are intact.  Expressive speech is intact.  Patient's fund of knowledge is within normal limits for educational level.   Gross Musculoskeletal Assessment Tremor: No resting tremor  Bulk: Normal Tone: Cogwheel rigidity LE > UE. Hamstrings are spastic on L>R, Modified Ashworth 2 and 1, respectively.  GAIT: Distance walked: 80 ft Assistive device utilized: None Level of assistance: SBA Comments: Decreased arm swing, decreased trunk rotation with arm/leg swing, LLE decreased heel strike/mid-foot initial contact  Posture: Forward head, rounded shoulders   AROM  AROM (Normal range in degrees) AROM  04/22/2023  Lumbar   Flexion (65) 75%*  Extension (30) 50% no pain  Right lateral flexion (25) 75% (R-sided discomfort)  Left lateral flexion (25) 75% (R-sided discomfort)  Right rotation (30) 50%  Left rotation (30) 25%          Ankle    Dorsiflexion (20) WFL WFL  Plantarflexion (50)    Inversion (35)    Eversion (15    (* = pain; Blank rows = not tested)   LE MMT:  MMT (out of 5) Right 04/22/2023 Left 04/22/2023 Right 06/03/23 Left 06/03/23 Right 06/26/23 Left 06/26/23 Right 08/12/23 Left 08/12/23  Hip flexion 4- 3+ 5- 4- 5 4+ 4+ 4  Hip extension          Hip abduction (seated) 5 5 5 5 5 5 5 5   Hip adduction (seated) 5 5 5 5 5 5 5 5   Hip internal rotation          Hip external rotation          Knee flexion 5 4 5  4+ 5 5 5 5   Knee extension 5 4 5 4 5 5 5 5   Ankle dorsiflexion 5 4- 5 4- 5 4 5  4+  Ankle plantarflexion          Ankle  inversion          Ankle eversion          (* = pain; Blank rows = not tested)   Coordination/Cerebellar Finger to Nose: L-sided hypometria  Heel to Shin: L-sided impairment  Rapid alternating movements: Impaired L>R, mild impairment LE toe tapping  Finger Opposition: WNL Pronator Drift: Positive LUE   FUNCTIONAL OUTCOME MEASURES (initial eval)   Results Comments  BERG 50/56 Fall risk, in need of intervention  DGI Next visit/24   TUG 13.6 seconds   5TSTS 35.2 seconds   (Blank rows = not tested)   Postural Control Screen Romberg: EO WNL, EC up to 30 sec with significant sway and near-LOB x 2   Lumbar Spine Screen (04/24/23) SLUMP: Negative SLR: Negative Repeated extension in standing: increases, worse pain  locally along low back    TODAY'S TREATMENT : 08/19/23   SUBJECTIVE STATEMENT:   Patient reports doing well recently with gait/balance and reports continual challenges with dragging LLE intermittently. Pt has follow-up with MD this afternoon for his PD/back.    Therapeutic Exercise - improved strength as needed to improve performance of CKC activities/functional movements and as needed for power production to prevent fall during episode of large postural perturbation   NuStep; Level 6, x 6 minutes - for improved soft tissue mobility and increased tissue temperature to improve muscle performance   -interval subjective gathered during this time (3 mins unbilled)   PATIENT EDUCATION: Discussed continued POC with focus on gait/balance and transitioning focus next Wednesday to low back eval & treat.    *not today* // bars: high knees with opposite knee touch, 5-lb ankle weights; 5x D/B    Neuromuscular Re-education - fall risk screening/management; for improved sensory integration, static and dynamic postural control, equilibrium and non-equilibrium coordination as needed for negotiating home and community environment and stepping over obstacles   Forward step up to  hurdle stance, 6-inch step (staircase, center of gym); x 10 with each LE   In // bars:Tandem stance, on Airex pad; 2 x 30 sec bilat, intermittent RUE touch during second set   Standing rockerboard, double limb; x 15 (forward/backward)  Forward step up to BOSU ball; x10 with each LLE leading; x 10 with RLE leading   Balloon toss in // bars standing on AE pad with PT, 2 x 2'    Side stepping x 25 ft ea direction    Backwards walking x 25 ft    Sit to stand with 6# overhead press when standing 2 x 10    Forward marching over 6 hurdles in // bars, 5 laps   Tandem walking on AE beam in // bars, 5 laps    *not today* Exaggerated arm and leg swing with 4-lb ankle weights; // bars; 5x D/B length of bars 6-inch step + Airex forward step up; 1 x 15 with each LE Obstacle course: alternating cone tap (4 cones on R and L); (3) 6-inch hurdles; Airex step up/down x 3 pads; 5x D/B  Sit to stand with 4-lb Med ball overhead reach, standing on Airex; 2x10  Toe tapping on single cone; 2 x 10 alternating R/L  Forward march along blue agility ladder, exaggerated arm and leg swing; 5x D/B  Toe tapping on 6-inch step; 2x10 Feet together with perturbations, on Airex pad; 2 x 1-minute bouts    PATIENT EDUCATION:  Education details: see above for patient education details Person educated: Patient Education method: Explanation Education comprehension: verbalized understanding   HOME EXERCISE PROGRAM: Access Code: [email or web address] URL: [email or web address] Date: 06/26/2023 Prepared by: Venetia Endo  Exercises - Step Forward with Arms Reaching to Sides  - 2 x daily - 7 x weekly - 2 sets - 10 reps - Step Sideways with Arms Reaching  - 2 x daily - 7 x weekly - 2 sets - 10 reps - Sit to Stand with Arm Reach  - 2 x daily - 7 x weekly - 2 sets - 8 reps - Heel Toe Raises with Counter Support  - 2 x daily - 7 x weekly - 2 sets - 10 reps - Runner's Step Up/Down  - 1 x daily - 4-5 x weekly - 2  sets - 10 reps - Standing Single Leg Stance with Counter Support  - 1 x daily - 4-5 x  weekly - 3-4 sets - 10sec hold   Access Code: [email or web address] URL: [email or web address] Date: 04/24/2023 Prepared by: Venetia Endo  Exercises - Supine Lower Trunk Rotation  - 2 x daily - 7 x weekly - 2 sets - 10 reps - Supine Piriformis Stretch with Foot on Ground  - 2 x daily - 7 x weekly - 3 sets - 30sec hold - Seated Hamstring Stretch  - 2 x daily - 7 x weekly - 3 sets - 30sec hold   ASSESSMENT:  CLINICAL IMPRESSION: Patient has made excellent progress with this episode of care and has met most goals related to fall risk/balance. Pt was able to perform side-stepping and marching with minimal postural sway. Pt struggled with backwards walking especially with initiation of LLE backwards. Pt has good postural corrections with LOB and minimal postural sway during dynamic balance activities. Pt is ready for discharge from his balance case at this time as he has met his goals.   REHAB POTENTIAL: Good  CLINICAL DECISION MAKING: Evolving/moderate complexity  EVALUATION COMPLEXITY: High   GOALS: Goals reviewed with patient? Yes  SHORT TERM GOALS: Target date: 05/16/2023  Pt will be independent with HEP in order to improve strength and balance in order to decrease fall risk and improve function at home. Baseline: 04/22/23: Formal HEP to be issued on visit # 2.   04/24/23: HEP given for thoracolumbar mobility, reviewed baseline exercises for LE strengthening/fall risk.    06/03/23: Pt is 50% compliant; he has difficulty completing on some days.   06/26/23: Dec compliance through work week; gets some exercise done each day.  08/12/23: Pt reports partial compliance while on recent vacation, pt reports at least every other day completing exercises over vacation.  Goal status: PARTIALLY MET    LONG TERM GOALS: Target date: 06/20/2023  Patient will improve MMT of tested LLE musculature to 4+/5 or  greater indicative of improved strength as needed for power production to prevent fall and to improve performance of transfers and stair negotiation.  Baseline: 04/22/23: LLE gross strength 3+ to 4 (exception of seated hip ABD/ADD 5/5, not tested in standard testing position).     06/03/23: Modest improvement in strength, no change in L quads or dorsiflexors.   06/26/23: Met for all with exception of dorsiflexor weakness.   08/12/23: Met for all with exception of L hip flexion  Goal status: MOSTLY MET/IN PROGRESS   2.  Pt will improve BERG by at least 3 points in order to demonstrate clinically significant improvement in balance.   Baseline: 04/22/23: To be completed on visit # 2.    04/24/23: 50/56       06/03/23: 52/56      06/26/23: 54/56 Goal status: ACHIEVED  3. Pt will decrease 5TSTS by at least 3 seconds in order to demonstrate clinically significant improvement in LE strength      Baseline: 04/22/23: 35.2 sec     06/03/23: 14.3 sec Goal status: ACHIEVED   4. Pt will improve DGI by at least 3 points in order to demonstrate clinically significant improvement in balance and decreased risk for falls.     Baseline: 04/22/23: To be completed on visit # 3.  04/29/23: 15/24    06/03/23: 18/24    06/26/23: 21/24 Goal status: ACHIEVED   5. Pt will complete TUG in less than 11.5 sec indicative of decreased fall risk per clinical population Baseline: 04/22/23: 13.6 sec     06/03/23: 10.9 sec  Goal status: ACHIEVED  PLAN: PT FREQUENCY: 2x/week  PT DURATION: 3-4 weeks  PLANNED INTERVENTIONS: Therapeutic exercises, Therapeutic activity, Neuromuscular re-education, Balance training, Gait training, Patient/Family education, Joint manipulation, Joint mobilization, Canalith repositioning, Aquatic Therapy, Dry Needling, Cognitive remediation, Electrical stimulation, Spinal manipulation, Spinal mobilization, Cryotherapy, Moist heat, Traction, Ultrasound, Ionotophoresis 4mg /ml Dexamethasone, and Manual therapy  PLAN  FOR NEXT SESSION: Transition to evaluation/treatment for lumbar spine with new episode of care on 08/21/23 visit.    Vernell Moats, SPT Maryanne Finder, PT, DPT Physical Therapist - [facility] Vernell Moats 08/19/2023, 9:53 AM

## 2023-08-21 ENCOUNTER — Encounter: Payer: Self-pay | Admitting: Physical Therapy

## 2023-08-21 ENCOUNTER — Ambulatory Visit: Admitting: Physical Therapy

## 2023-08-21 DIAGNOSIS — R262 Difficulty in walking, not elsewhere classified: Secondary | ICD-10-CM | POA: Diagnosis not present

## 2023-08-21 DIAGNOSIS — M6281 Muscle weakness (generalized): Secondary | ICD-10-CM

## 2023-08-21 DIAGNOSIS — M47816 Spondylosis without myelopathy or radiculopathy, lumbar region: Secondary | ICD-10-CM

## 2023-08-21 DIAGNOSIS — M5459 Other low back pain: Secondary | ICD-10-CM

## 2023-08-21 NOTE — Therapy (Unsigned)
 OUTPATIENT PHYSICAL THERAPY THORACOLUMBAR EVALUATION   Patient Name: Tyler Glover MRN: [id number] DOB:[birth year], 63 y.o., male Today's Date: 08/21/2023  END OF SESSION:  PT End of Session - 08/21/23 0954     Visit Number 1    Number of Visits 13    Date for PT Re-Evaluation 10/03/23    PT Start Time 0951    PT Stop Time 1031    PT Time Calculation (min) 40 min          Past Medical History:  Diagnosis Date   Allergy    Arthritis    Rheumatoid   GERD (gastroesophageal reflux disease)    Hypertension    Past Surgical History:  Procedure Laterality Date   COLONOSCOPY     COLONOSCOPY WITH PROPOFOL  N/A 08/21/2019   Procedure: COLONOSCOPY WITH PROPOFOL ;  Surgeon: Jinny Carmine, MD;  Location: [facility];  Service: Endoscopy;  Laterality: N/A;  priority 4   ESOPHAGOGASTRODUODENOSCOPY (EGD) WITH PROPOFOL  N/A 08/21/2019   Procedure: ESOPHAGOGASTRODUODENOSCOPY (EGD) WITH PROPOFOL ;  Surgeon: Jinny Carmine, MD;  Location: [facility];  Service: Endoscopy;  Laterality: N/A;   POLYPECTOMY  08/21/2019   Procedure: POLYPECTOMY;  Surgeon: Jinny Carmine, MD;  Location: [facility];  Service: Endoscopy;;   SHOULDER SURGERY     TESTICLE SURGERY     age 72 or 60   TONSILLECTOMY     Patient Active Problem List   Diagnosis Date Noted   Encounter for screening colonoscopy    Polyp of transverse colon    Heartburn    Pure hypercholesterolemia 01/26/2016   Right lateral epicondylitis 07/07/2015   Vitamin D  deficiency 12/24/2014   Prediabetes 12/24/2014   Allergic rhinitis, seasonal 06/03/2014   Acid reflux 06/03/2014   Essential (primary) hypertension 06/03/2014   Family history of diabetes mellitus 06/03/2014   Family history of cardiac disorder 06/03/2014   Obesity (BMI 30.0-34.9) 06/03/2014    PCP: Joshua Cathryne BROCKS, MD (Inactive)  REFERRING PROVIDER: Mayur Loree Blanch, MD, PharmD  REFERRING DIAG: [phone] Lumbar spondylosis, M54.9 Back pain  RATIONALE  FOR EVALUATION AND TREATMENT: Rehabilitation  THERAPY DIAG: Other low back pain  Lumbar spondylosis  ONSET DATE: May 2025 (worsening symptoms, acute on chronic flare-up)  FOLLOW-UP APPT SCHEDULED WITH REFERRING PROVIDER: None currently in EMR   SUBJECTIVE:                                                                                                                                                                                         SUBJECTIVE STATEMENT:  :Pt is a 63 year old male with Parkinson's disease who ambulates at community-level without AD; he is well known  to this clinic and has been attending PT with focus on gait/balance/strengthening. Pt has new referral for lumbar spondylosis/LBP.   PERTINENT HISTORY: Patient is s/p ESI at R L4-L5/L5-S1 neuroforamen. Pt reports acute flare-up of back pain in May of this year with worsening pain and R-sided pain primarily. Pt reports attempting to care for it on his own. He reports dealing with it most of this year prior to that. He feels that injections have helped. He state she still has pain along R side. Pt reports pain in R flank/R lower lumbar paraspinal region. Pt reports previous episode of L-sided sciatica - not present now. He reports no current paresthesias/numbness. Patient reports intermittent disturbed sleep due to low back.   Previous episode of care focused on gait, balance, and LLE weakness within context of Parkinson's disease. Pt met goals for outcome measures. He would still like to work on improving LLE strength and foot clearance.   PAIN:    Pain Intensity: Present: 4-6/10, Best: 0/10, Worst: 9/10 Pain location: R lower lumbar paraspinal/R flank Pain Quality: sharp  Radiating: No  Numbness/Tingling: No Focal Weakness: Yes; weakness in LLE due to neurological deficits; no R-sided weakness per pt.  Aggravating factors: leaning over countertop/desktop, bending down to pick up items, prolonged sitting > 1-2  hours Relieving factors: Double knee to chest, heat, ice, Salonpas patch, Tylenol  24-hour pain behavior: worse late in the day  How long can you sit: painful after 1-2 hours History of prior back injury, pain, surgery, or therapy: Yes; Hx of episodic back pain managed with chiropractic and medical management/injections  Dominant hand: right Imaging: Yes ;  CLINICAL DATA:  Low back pain progressively worsening. Right thigh numbness   EXAM: MRI LUMBAR SPINE WITHOUT CONTRAST   TECHNIQUE: Multiplanar, multisequence MR imaging of the lumbar spine was performed. No intravenous contrast was administered.   COMPARISON:  None Available.   FINDINGS: Segmentation:  Standard.   Alignment:  2 mm retrolisthesis of L2 on L3.   Vertebrae: No acute fracture, evidence of discitis, or aggressive bone lesion.   Conus medullaris and cauda equina: Conus extends to the T12-L1 level. Conus and cauda equina appear normal.   Paraspinal and other soft tissues: No acute paraspinal abnormality.   Disc levels:   Disc spaces: Degenerative disease with disc height loss at L5-S1. Mild disc height loss at L1-2. Disc desiccation at L1-2, L2-3 and L3-4.   T11-12: Mild broad-based disc bulge. No foraminal or central canal stenosis.   T12-L1: No significant disc bulge. No neural foraminal stenosis. No central canal stenosis. Mild bilateral facet arthropathy.   L1-L2: Mild broad-based disc bulge. Mild bilateral facet arthropathy. No foraminal or central canal stenosis.   L2-L3: Broad-based disc bulge. Mild bilateral facet arthropathy. Bilateral lateral recess narrowing. Minimal spinal stenosis. No foraminal stenosis.   L3-L4: Mild broad-based disc bulge. Mild bilateral facet arthropathy. No foraminal or central canal stenosis.   L4-L5: No significant disc bulge. No neural foraminal stenosis. No central canal stenosis. Mild bilateral facet arthropathy.   L5-S1: Broad-based disc osteophyte complex  with a focal prominent right far lateral disc osteophyte complex. No foraminal or central canal stenosis.   IMPRESSION: 1. Lumbar spine spondylosis as described above. 2. No acute osseous injury of the lumbar spine.   Red flags: Negative for bowel/bladder changes, saddle paresthesia, personal history of cancer, h/o spinal tumors, h/o compression fx, h/o abdominal aneurysm, abdominal pain, chills/fever, night sweats, nausea, vomiting, unrelenting pain, first onset of insidious LBP <20 y/o  PRECAUTIONS: None  WEIGHT BEARING RESTRICTIONS: No  FALLS: Has patient fallen in last 6 months? No  Living Environment Lives with: lives with their spouse and mother  Lives in: House/apartment; one step to get into home; stairs to get into home, pt has upstairs bedroom. Pt reports doing fairly well with stairs. 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  Recent memory is intact.  Remote memory is intact.  Attention span and concentration are intact.  Expressive speech is intact.  Patient's fund of knowledge is within normal limits for educational level.    Gross Musculoskeletal Assessment Tremor: None Bulk: Normal Tone: Normal No visible step-off along spinal column, no signs of scoliosis  GAIT: Distance walked: *** Assistive device utilized: {Assistive devices:23999} Level of assistance: {Levels of assistance:24026} Comments: ***  Posture: Lumbar lordosis: WNL Iliac crest height: Equal bilaterally Lumbar lateral shift: Negative  AROM AROM (Normal range in degrees)  AROM  08/21/23  Lumbar   Flexion (65) 75%*  Extension (30) 75%*  Right lateral flexion (25) 75%  Left lateral flexion (25) 75%  Right rotation (30) 100%  Left rotation (30) 50%      Hip Right Left  Flexion (125)    Extension (15)    Abduction (40)    Adduction     Internal Rotation (45)    External Rotation (45)        Knee    Flexion (135)    Extension (0)        Ankle    Dorsiflexion (20)    Plantarflexion (50)    Inversion (35)    Eversion (15)    (* = pain; Blank rows = not tested)  LE MMT: MMT (out of 5) Right 08/21/23 Left 08/21/23  Hip flexion 5 5  Hip extension    Hip abduction    Hip adduction    Hip internal rotation    Hip external rotation    Knee flexion 5 5  Knee extension 5 5-  Ankle dorsiflexion 4 4  Ankle plantarflexion    Ankle inversion    Ankle eversion    (* = pain; Blank rows = not tested)  Sensation Grossly intact to light touch throughout bilateral LEs as determined by testing dermatomes L2-S2. Proprioception, stereognosis, and hot/cold testing deferred on this date.  Reflexes R/L Knee Jerk (L3/4): 2+/2+  Ankle Jerk (S1/2): Unable to obtain  Clonus: Negative bilat   Muscle Length Hamstrings: R: {NEGATIVE/POSITIVE QNM:80001} L: {NEGATIVE/POSITIVE QNM:80001} Ely (quadriceps): R: {NEGATIVE/POSITIVE QNM:80001} L: {NEGATIVE/POSITIVE QNM:80001} Thomas (hip flexors): R: {NEGATIVE/POSITIVE QNM:80001} L: {NEGATIVE/POSITIVE QNM:80001} Ober: R: {NEGATIVE/POSITIVE QNM:80001} L: {NEGATIVE/POSITIVE QNM:80001}  Palpation Location Right Left         Lumbar paraspinals    Quadratus Lumborum    Gluteus Maximus    Gluteus Medius    Deep hip external rotators    PSIS    Fortin's Area (SIJ)    Greater Trochanter    (Blank rows = not tested) Graded on 0-4 scale (0 = no pain, 1 = pain, 2 = pain with wincing/grimacing/flinching, 3 = pain with withdrawal, 4 =  unwilling to allow palpation)  Passive Accessory Intervertebral Motion Pt denies  reproduction of back pain with CPA L1-L5 and UPA bilaterally L1-L5. Generally, hypomobile throughout  Special Tests Lumbar Radiculopathy and Discogenic: Centralization and Peripheralization (SN 92, -LR 0.12): {NEGATIVE/POSITIVE FOR:19998} Slump (SN 83, -LR 0.32): R: Negative L: Negative SLR (SN 92, -LR 0.29): R: Negative L:  Negative   Facet Joint: Extension-Rotation (SN 100, -LR 0.0): R: Negative L: Negative  Lumbar Foraminal Stenosis: Lumbar quadrant (SN 70): R: Negative L: Negative  Hip: FABER (SN 81): R: {NEGATIVE/POSITIVE QNM:80001} L: {NEGATIVE/POSITIVE QNM:80001} FADIR (SN 94): R: {NEGATIVE/POSITIVE FOR:19998} L: {NEGATIVE/POSITIVE QNM:80001} Hip scour (SN 50): R: {NEGATIVE/POSITIVE QNM:80001} L: {NEGATIVE/POSITIVE QNM:80001}  SIJ:  Thigh Thrust (SN 88, -LR 0.18) : R: {NEGATIVE/POSITIVE QNM:80001} L: {NEGATIVE/POSITIVE QNM:80001}  Piriformis Syndrome: FAIR Test (SN 88, SP 83): R: {NEGATIVE/POSITIVE QNM:80001} L: {NEGATIVE/POSITIVE QNM:80001}  Functional Tasks Lifting: Deep squat: Sit to stand: Forward Step-Down Test: R:  L:  Lateral Step-Down Test: R:  L:   Beighton scale  LEFT  RIGHT           1. Passive dorsiflexion and hyperextension of the fifth MCP joint beyond 90  0 0   2. Passive apposition of the thumb to the flexor aspect of the forearm  0  0   3. Passive hyperextension of the elbow beyond 10  0  0   4. Passive hyperextension of the knee beyond 10  0  0   5. Active forward flexion of the trunk with the knees fully extended so that the palms of the hands rest flat on the floor   0   TOTAL         0/ 9      TODAY'S TREATMENT: DATE: 08/21/23     PATIENT EDUCATION:  Education details: *** Person educated: {Person educated:25204} Education method: {Education Method:25205} Education comprehension: {Education Comprehension:25206}   HOME EXERCISE PROGRAM:  Access Code: [email or web address] URL: [email or web address] Date: 08/21/2023 Prepared by:  Venetia Endo  Exercises - Supine Quadratus Lumborum Stretch  - 2 x daily - 7 x weekly - 2 sets - 10 reps - 2-3sec hold - Seated Hamstring Stretch  - 2 x daily - 7 x weekly - 3 sets - 30sec hold   ASSESSMENT:  CLINICAL IMPRESSION: Patient is a 63 y.o. male with Parkinson's disease - known to this clinic with recent episode of care focused on PD/gait/balance/strengthening; pt was seen today for physical therapy evaluation and treatment for ***.   OBJECTIVE IMPAIRMENTS: {opptimpairments:25111}.   ACTIVITY LIMITATIONS: {activitylimitations:27494}  PARTICIPATION LIMITATIONS: {participationrestrictions:25113}  PERSONAL FACTORS: {Personal factors:25162} are also affecting patient's functional outcome.   REHAB POTENTIAL: {rehabpotential:25112}  CLINICAL DECISION MAKING: {clinical decision making:25114}  EVALUATION COMPLEXITY: {Evaluation complexity:25115}   GOALS: Goals reviewed with patient? {yes/no:20286}  SHORT TERM GOALS: Target date: {follow up:25551}  Pt will be independent with HEP in order to improve strength and decrease back pain to improve pain-free function at home and work. Baseline: *** Goal status: INITIAL   LONG TERM GOALS: Target date: {follow up:25551}  Pt will increase FOTO to at least *** to demonstrate significant improvement in function at home and work related to back pain  Baseline:  Goal status: INITIAL  2.  Pt will decrease worst back pain by at least 2 points on the NPRS in order to demonstrate clinically significant reduction in back pain. Baseline: *** Goal status: INITIAL  3.  Pt will decrease mODI score by at least 13 points in order demonstrate clinically significant reduction  in back pain/disability.       Baseline: *** Goal status: INITIAL  4.  *** Baseline: *** Goal status: INITIAL   PLAN: PT FREQUENCY: 1-2x/week  PT DURATION: 6 weeks  PLANNED INTERVENTIONS: Therapeutic exercises, Therapeutic activity, Neuromuscular  re-education, Balance training, Gait training, Patient/Family education, Self Care, Joint mobilization, Joint manipulation, Vestibular training, Canalith repositioning, Orthotic/Fit training, DME instructions, Dry Needling, Electrical stimulation, Spinal manipulation, Spinal mobilization, Cryotherapy, Moist heat, Taping, Traction, Ultrasound, Ionotophoresis 4mg /ml Dexamethasone, Manual therapy, and Re-evaluation.  PLAN FOR NEXT SESSION: Check hip PROM and FABER testing. Continue with thoracolumbar mobility. Manual techniques/DTM along R QL/iliocostalis lumborum.    Venetia Endo, PT, DPT #[id number]  Venetia ONEIDA Endo, PT 08/21/2023, 9:54 AM

## 2023-08-27 ENCOUNTER — Ambulatory Visit: Admitting: Physical Therapy

## 2023-08-27 ENCOUNTER — Ambulatory Visit: Admitting: Urology

## 2023-08-27 DIAGNOSIS — R2681 Unsteadiness on feet: Secondary | ICD-10-CM

## 2023-08-27 DIAGNOSIS — R262 Difficulty in walking, not elsewhere classified: Secondary | ICD-10-CM

## 2023-08-27 DIAGNOSIS — M5459 Other low back pain: Secondary | ICD-10-CM

## 2023-08-27 DIAGNOSIS — M47816 Spondylosis without myelopathy or radiculopathy, lumbar region: Secondary | ICD-10-CM

## 2023-08-27 DIAGNOSIS — M6281 Muscle weakness (generalized): Secondary | ICD-10-CM

## 2023-08-27 NOTE — Therapy (Unsigned)
 OUTPATIENT PHYSICAL THERAPY TREATMENT   Patient Name: Tyler Glover MRN: [id number] DOB:1960/12/13, 63 y.o., male Today's Date: 08/27/2023   END OF SESSION:  PT End of Session - 08/27/23 0858     Visit Number 2    Number of Visits 13    Date for PT Re-Evaluation 10/03/23    PT Start Time 0859    PT Stop Time 0943    PT Time Calculation (min) 44 min    Behavior During Therapy [facility] for tasks assessed/performed           Past Medical History:  Diagnosis Date   Allergy    Arthritis    Rheumatoid   GERD (gastroesophageal reflux disease)    Hypertension    Past Surgical History:  Procedure Laterality Date   COLONOSCOPY     COLONOSCOPY WITH PROPOFOL  N/A 08/21/2019   Procedure: COLONOSCOPY WITH PROPOFOL ;  Surgeon: Jinny Carmine, MD;  Location: [facility];  Service: Endoscopy;  Laterality: N/A;  priority 4   ESOPHAGOGASTRODUODENOSCOPY (EGD) WITH PROPOFOL  N/A 08/21/2019   Procedure: ESOPHAGOGASTRODUODENOSCOPY (EGD) WITH PROPOFOL ;  Surgeon: Jinny Carmine, MD;  Location: [facility];  Service: Endoscopy;  Laterality: N/A;   POLYPECTOMY  08/21/2019   Procedure: POLYPECTOMY;  Surgeon: Jinny Carmine, MD;  Location: [facility];  Service: Endoscopy;;   SHOULDER SURGERY     TESTICLE SURGERY     age 79 or 21   TONSILLECTOMY     Patient Active Problem List   Diagnosis Date Noted   Encounter for screening colonoscopy    Polyp of transverse colon    Heartburn    Pure hypercholesterolemia 01/26/2016   Right lateral epicondylitis 07/07/2015   Vitamin D  deficiency 12/24/2014   Prediabetes 12/24/2014   Allergic rhinitis, seasonal 06/03/2014   Acid reflux 06/03/2014   Essential (primary) hypertension 06/03/2014   Family history of diabetes mellitus 06/03/2014   Family history of cardiac disorder 06/03/2014   Obesity (BMI 30.0-34.9) 06/03/2014    PCP: Joshua Cathryne BROCKS, MD (Inactive)  REFERRING PROVIDER: Mayur Loree Blanch, MD, PharmD  REFERRING DIAG: [phone]  Lumbar spondylosis, M54.9 Back pain  RATIONALE FOR EVALUATION AND TREATMENT: Rehabilitation  THERAPY DIAG: Other low back pain  Lumbar spondylosis  Muscle weakness (generalized)  Difficulty in walking, not elsewhere classified  Unsteadiness on feet  ONSET DATE: May 2025 (worsening symptoms, acute on chronic flare-up)  FOLLOW-UP APPT SCHEDULED WITH REFERRING PROVIDER: None currently in EMR  PERTINENT HISTORY: Pt is a 63 year old male with Parkinson's disease who ambulates at community-level without AD. Pt has new referral for lumbar spondylosis/LBP. Pt is well known to this clinic and has been attending PT with focus on gait/balance/strengthening.   Patient is s/p ESI at R L4-L5/L5-S1 neuroforamen. Pt reports acute flare-up of back pain in May of this year with worsening pain and R-sided symptoms primarily. Pt reports attempting to care for it on his own. He reports dealing with it most of this year prior to that. He feels that injections have helped. He state she still has pain along R side. Pt reports pain in R flank/R lower lumbar paraspinal region. Pt reports previous episode of L-sided sciatica - not present now. He reports no current paresthesias/numbness. Patient reports intermittent disturbed sleep due to low back.   Previous episode of care focused on gait, balance, and LLE weakness within context of Parkinson's disease. Pt met goals for outcome measures. He would still like to work on improving LLE strength and foot clearance.   PAIN:  Pain Intensity: Present: 4-6/10, Best: 0/10, Worst: 9/10 Pain location: R lower lumbar paraspinal/R flank Pain Quality: sharp  Radiating: No  Numbness/Tingling: No Focal Weakness: Yes; weakness in LLE due to neurological deficits; no R-sided weakness per pt.  Aggravating factors: leaning over countertop/desktop, bending down to pick up items, prolonged sitting > 1-2 hours Relieving factors: Double knee to chest, heat, ice, Salonpas patch,  Tylenol  24-hour pain behavior: worse late in the day  How long can you sit: painful after 1-2 hours History of prior back injury, pain, surgery, or therapy: Yes; Hx of episodic back pain managed with chiropractic and medical management/injections  Dominant hand: right Imaging: Yes ;  CLINICAL DATA:  Low back pain progressively worsening. Right thigh numbness   EXAM: MRI LUMBAR SPINE WITHOUT CONTRAST   TECHNIQUE: Multiplanar, multisequence MR imaging of the lumbar spine was performed. No intravenous contrast was administered.   COMPARISON:  None Available.   FINDINGS: Segmentation:  Standard.   Alignment:  2 mm retrolisthesis of L2 on L3.   Vertebrae: No acute fracture, evidence of discitis, or aggressive bone lesion.   Conus medullaris and cauda equina: Conus extends to the T12-L1 level. Conus and cauda equina appear normal.   Paraspinal and other soft tissues: No acute paraspinal abnormality.   Disc levels:   Disc spaces: Degenerative disease with disc height loss at L5-S1. Mild disc height loss at L1-2. Disc desiccation at L1-2, L2-3 and L3-4.   T11-12: Mild broad-based disc bulge. No foraminal or central canal stenosis.   T12-L1: No significant disc bulge. No neural foraminal stenosis. No central canal stenosis. Mild bilateral facet arthropathy.   L1-L2: Mild broad-based disc bulge. Mild bilateral facet arthropathy. No foraminal or central canal stenosis.   L2-L3: Broad-based disc bulge. Mild bilateral facet arthropathy. Bilateral lateral recess narrowing. Minimal spinal stenosis. No foraminal stenosis.   L3-L4: Mild broad-based disc bulge. Mild bilateral facet arthropathy. No foraminal or central canal stenosis.   L4-L5: No significant disc bulge. No neural foraminal stenosis. No central canal stenosis. Mild bilateral facet arthropathy.   L5-S1: Broad-based disc osteophyte complex with a focal prominent right far lateral disc osteophyte complex. No  foraminal or central canal stenosis.   IMPRESSION: 1. Lumbar spine spondylosis as described above. 2. No acute osseous injury of the lumbar spine.   Red flags: Negative for bowel/bladder changes, saddle paresthesia, personal history of cancer, h/o spinal tumors, h/o compression fx, h/o abdominal aneurysm, abdominal pain, chills/fever, night sweats, nausea, vomiting, unrelenting pain, first onset of insidious LBP <20 y/o  PRECAUTIONS: None  WEIGHT BEARING RESTRICTIONS: No  FALLS: Has patient fallen in last 6 months? No  Living Environment Lives with: lives with their spouse and mother  Lives in: House/apartment; one step to get into home; stairs to get into home, pt has upstairs bedroom. Pt reports doing fairly well with stairs. 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   Hobbies: Visiting museums, sight-seeing, travel  Patient Goals: Reduced pain, improved movement     OBJECTIVE (data from initial evaluation unless otherwise dated):   Patient Surveys  ODI = 10/50 = 20%  GAIT: Distance walked: 40 ft Assistive device utilized: None Level of assistance: SBA Comments: Forward-flexed posture, dec step length  Posture: Moderate thoracic kyphosis and forward head, slouched posture c Hx of Parkinson's  Lumbar lordosis: WNL Iliac crest height: Equal bilaterally Lumbar lateral shift: Negative  AROM AROM (Normal range in degrees) AROM  08/21/23  Lumbar   Flexion (65) 75%*  Extension (30) 75%*  Right lateral flexion (25) 75%  Left lateral flexion (25) 75%  Right rotation (30) 100%  Left rotation (30) 50%      Hip Right Left  Flexion (125) WNL WNL  Extension (15)    Abduction (40)    Adduction     Internal Rotation (45)    External Rotation (45)        (* = pain;  Blank rows = not tested)  LE MMT: MMT (out of 5) Right 08/21/23 Left 08/21/23  Hip flexion 5 5  Hip extension    Hip abduction    Hip adduction    Hip internal rotation    Hip external rotation    Knee flexion 5 5  Knee extension 5 5-  Ankle dorsiflexion 4 4  Ankle plantarflexion    Ankle inversion    Ankle eversion    (* = pain; Blank rows = not tested)  Sensation Grossly intact to light touch throughout bilateral LEs as determined by testing dermatomes L2-S2. Proprioception, stereognosis, and hot/cold testing deferred on this date.  Reflexes R/L Knee Jerk (L3/4): 2+/2+  Ankle Jerk (S1/2): Unable to obtain  Clonus: Negative bilat   Muscle Length Hamstrings: R: Positive L: Positive E[place] (quadriceps): R: Positive L: Positive  Palpation Location Right Left         Lumbar paraspinals 2   Quadratus Lumborum 1   Gluteus Maximus 1   Gluteus Medius 0   Deep hip external rotators 0   PSIS 0   Fortin's Area (SIJ) 0   Greater Trochanter    (Blank rows = not tested) Graded on 0-4 scale (0 = no pain, 1 = pain, 2 = pain with wincing/grimacing/flinching, 3 = pain with withdrawal, 4 = unwilling to allow palpation)  Special Tests Lumbar Radiculopathy and Discogenic: Centralization and Peripheralization (SN 92, -LR 0.12): Not examined.  Slump (SN 83, -LR 0.32): R: Negative L: Negative SLR (SN 92, -LR 0.29): R: Negative L:  Negative   Facet Joint: Extension-Rotation (SN 100, -LR 0.0): R: Negative L: Negative  Lumbar Foraminal Stenosis: Lumbar quadrant (SN 70): R: Negative L: Negative  Hip: FABER (SN 81): R: Negative (pain with release of FABER on R;  L: Negative   Passive Accessory Intervertebral Motion Pain at restriction L3-5 CPA, moderate hypomobility of lower lumbar spine.     TODAY'S TREATMENT: DATE: 08/27/2023    SUBJECTIVE STATEMENT:   Patient reports having notable back pain with disturbed sleep at 4 AM. Patient reports that he is moving around better during  the day. Patient reports intermittent dragging with LLE, but no major near-fall incidents. Patient reports primarily R paraspinal pain. 3-4/10 pain at this time; up to 6-7/10 at night with disturbed sleep episodes.      Manual Therapy - for symptom modulation, soft tissue sensitivity and mobility, joint mobility, ROM   Hooklying lumbar traction, 10-sec intermittent holds; x 6 minutes, PT at foot of treatment table STM/DTM R iliocostalis lumborum; x 10 minutes   Therapeutic Exercise - for improved soft tissue flexibility and extensibility as needed for ROM, improved strength as needed to improve performance of CKC activities/functional movements  Repeated movement screen:  - Repeated extension in lying: increase in R flank pain during, no change after   - Repeated flexion in lying: sharp pain with  lowering legs, more irritated after movement   Lower trunk rotations, hooklying; 1 x 10 alt R/L  Lower trunk rotations; with QL bias; x 10 (LLE crossed to stretch R QL today), 3 sec hold  PATIENT EDUCATION: Discussed expectations with repeated movement program, instructions on when to discontinue. Updated HEP and provided handout.    PATIENT EDUCATION:  Education details: see above for patient education details Person educated: Patient Education method: Explanation, Demonstration, and Handouts Education comprehension: verbalized understanding and returned demonstration   HOME EXERCISE PROGRAM:  Access Code: [email or web address] URL: [email or web address] Date: 08/27/2023 Prepared by: Venetia Endo  Exercises - Prone Press Up  - 5-6 x daily - 7 x weekly - 1 sets - 10 reps - 1sec hold - Supine Quadratus Lumborum Stretch  - 2 x daily - 7 x weekly - 2 sets - 10 reps - 2-3sec hold - Seated Hamstring Stretch  - 2 x daily - 7 x weekly - 3 sets - 30sec hold   ASSESSMENT:  CLINICAL IMPRESSION: Patient largely had minimal reproduction of symptoms with provocative testing at eval.  However, he does have persistent R lumbar paraspinal pain. He has less sensitivity along QL region today, and pain is more localized to R iliocostalis/longissimus lumborum. Pt has more aggravation of symptoms with repeated flexion and no significant change with repeated extension. Given subjective history and relatively neutral response with extension, we will initiate trial of this for HEP as provisional MDT strategy and monitor for response with successive visits. Pt has current deficits in thoracolumbar AROM, decreased LLE strength, lumbar spine stiffness/hypomobility, posterior chain tightness/dec flexibility. Pt will continue to benefit from skilled PT services to address deficits and improve function.   OBJECTIVE IMPAIRMENTS: Abnormal gait, decreased mobility, decreased ROM, decreased strength, hypomobility, impaired flexibility, postural dysfunction, and pain.   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   REHAB POTENTIAL: Good  CLINICAL DECISION MAKING: Evolving/moderate complexity  EVALUATION COMPLEXITY: Moderate   GOALS: Goals reviewed with patient? Yes  SHORT TERM GOALS: Target date: 09/12/2023  Pt will be independent with HEP in order to improve strength and decrease back pain to improve pain-free function at home and work. Baseline: 08/21/23: Baseline HEP initiated.  Goal status: INITIAL   LONG TERM GOALS: Target date: 10/10/2023  Patient will have full thoracolumbar AROM without reproduction of pain as needed for reaching items on ground, household chores, bending Baseline: 08/21/23: Motion loss  and pain with flexion, motion loss with extension, bilateral lateral flexion, L rotation.  Goal status: INITIAL  2.  Pt will decrease worst back pain by at least 2 points on the NPRS in order to demonstrate clinically significant reduction in back pain. Baseline: 08/21/23: 9/10  Goal status: INITIAL  3.  Pt will decrease mODI score by at least 13 points in order demonstrate clinically significant reduction in back pain/disability.       Baseline: 08/21/23: 10/50 = 20% Goal status: INITIAL  4.  Pt will tolerate sitting for leisure activity and traveling up to 1 hour without reproduction of pain.  Baseline: 08/21/23: Pain with sitting > 1-2 hr.  Goal status: INITIAL   PLAN: PT FREQUENCY: 1-2x/week  PT DURATION: 6 weeks  PLANNED INTERVENTIONS: Therapeutic exercises, Therapeutic activity, Neuromuscular re-education, Balance training, Gait  training, Patient/Family education, Self Care, Joint mobilization, Joint manipulation, Vestibular training, Canalith repositioning, Orthotic/Fit training, DME instructions, Dry Needling, Electrical stimulation, Spinal manipulation, Spinal mobilization, Cryotherapy, Moist heat, Taping, Traction, Ultrasound, Ionotophoresis 4mg /ml Dexamethasone, Manual therapy, and Re-evaluation.  PLAN FOR NEXT SESSION: Continue with thoracolumbar mobility. Manual techniques/DTM along R QL/iliocostalis lumborum.    Venetia Endo, PT, DPT #[id number]  Venetia ONEIDA Endo, PT 08/28/2023, 7:45 AM

## 2023-08-28 ENCOUNTER — Other Ambulatory Visit
Admission: RE | Admit: 2023-08-28 | Discharge: 2023-08-28 | Disposition: A | Discharge status: Home / Self Care | Attending: Urology | Admitting: Urology

## 2023-08-28 ENCOUNTER — Ambulatory Visit (INDEPENDENT_AMBULATORY_CARE_PROVIDER_SITE_OTHER): Admitting: Urology

## 2023-08-28 ENCOUNTER — Encounter: Payer: Self-pay | Admitting: Physical Therapy

## 2023-08-28 VITALS — BP 158/90 | HR 75 | Ht 70.0 in | Wt 216.0 lb

## 2023-08-28 DIAGNOSIS — E291 Testicular hypofunction: Secondary | ICD-10-CM

## 2023-08-28 DIAGNOSIS — N529 Male erectile dysfunction, unspecified: Secondary | ICD-10-CM

## 2023-08-28 DIAGNOSIS — R7989 Other specified abnormal findings of blood chemistry: Secondary | ICD-10-CM | POA: Diagnosis not present

## 2023-08-28 LAB — HEMOGLOBIN AND HEMATOCRIT, BLOOD
HCT: 44.5 % (ref 39.0–52.0)
Hemoglobin: 16.1 g/dL (ref 13.0–17.0)

## 2023-08-28 LAB — PSA: Prostatic Specific Antigen: 1.71 ng/mL (ref 0.00–4.00)

## 2023-08-28 MED ORDER — TADALAFIL 20 MG PO TABS: ORAL_TABLET | ORAL | 11 refills | Status: DC

## 2023-08-28 MED ORDER — TADALAFIL 20 MG PO TABS: ORAL_TABLET | ORAL | 11 refills | Status: AC

## 2023-08-28 MED ORDER — TESTOSTERONE 20.25 MG/ACT (1.62%) TD GEL: 1.0000 | Freq: Every day | TRANSDERMAL | 5 refills | Status: AC

## 2023-08-28 NOTE — Progress Notes (Signed)
 08/28/23 9:30 AM   Tyler Glover 18-Mar-1960 [id number]  CC: TRT management, ED, scrotal swelling, urinary symptoms  HPI: 63 year old male with Parkinson's disease who previously has been managed with testosterone  gel by Dr. Joshua.  After retirement, he is referred for urology to manage his TRT.  He has ED well-controlled on Cialis  10 to 20 mg every other day.  He also reports some new scrotal swelling that is minimally bothersome, as well as some occasional urinary symptoms with urgency or frequency.  TRT: Has been on testosterone  gel with excellent results, testosterone  levels have been normal and hematocrit normal, PSA was normal at 1.3.  Due for labs this year.  Urinary symptoms: Occasional urgency and frequency, minimally bothersome, does consume coffee and Gatorade.  No dysuria or gross hematuria.  No incontinence.  1 episode of urgency on a plane where he had difficulty urinating and had to rush to the bathroom after getting off the plane.  She  Scrotal swelling: Mild, occasionally achy at the end of the day   PMH: Past Medical History:  Diagnosis Date   Allergy    Arthritis    Rheumatoid   GERD (gastroesophageal reflux disease)    Hypertension     Surgical History: Past Surgical History:  Procedure Laterality Date   COLONOSCOPY     COLONOSCOPY WITH PROPOFOL  N/A 08/21/2019   Procedure: COLONOSCOPY WITH PROPOFOL ;  Surgeon: Jinny Carmine, MD;  Location: [facility];  Service: Endoscopy;  Laterality: N/A;  priority 4   ESOPHAGOGASTRODUODENOSCOPY (EGD) WITH PROPOFOL  N/A 08/21/2019   Procedure: ESOPHAGOGASTRODUODENOSCOPY (EGD) WITH PROPOFOL ;  Surgeon: Jinny Carmine, MD;  Location: [facility];  Service: Endoscopy;  Laterality: N/A;   POLYPECTOMY  08/21/2019   Procedure: POLYPECTOMY;  Surgeon: Jinny Carmine, MD;  Location: [facility];  Service: Endoscopy;;   SHOULDER SURGERY     TESTICLE SURGERY     age 68 or 47   TONSILLECTOMY       Family  History: Family History  Problem Relation Age of Onset   Healthy Mother    CAD Father    Hypertension Father    Diabetes Father     Social History:  reports that he quit smoking about 32 years ago. His smoking use included cigarettes. He has never used smokeless tobacco. He reports that he does not currently use alcohol. He reports that he does not use drugs.  Physical Exam: BP (!) 158/90 (BP Location: Left Arm, Patient Position: Sitting, Cuff Size: Large)   Pulse 75   Ht 5' 10 (1.778 m)   Wt 216 lb (98 kg)   SpO2 99%   BMI 30.99 kg/m    Constitutional:  Alert and oriented, No acute distress. Cardiovascular: No clubbing, cyanosis, or edema. Respiratory: Normal respiratory effort, no increased work of breathing. GI: Abdomen is soft, nontender, nondistended, no abdominal masses GU: Phallus with patent meatus, no lesions, testicles 20 cc and descended bilaterally, no tenderness, no swelling noted   Laboratory Data: Reviewed, see HPI   Assessment & Plan:   63 year old male with Parkinson's disease who previously was on TRT through his PCP and after their retirement opted to transfer TRT care to urology.  He also has ED well-controlled on Cialis .  Mild scrotal swelling with benign physical exam today.  Mild overactive symptoms, we discussed relationship between Parkinson's disease and OAB, recommend avoid bladder irritants  -Continue testosterone  gel, yearly PSA, H/H, testosterone  level -Continue Cialis  10 to 20 mg every other day -Behavioral strategies discussed  regarding urinary symptoms, check PVR at follow-up -Consider scrotal ultrasound if worsening scrotal swelling or discomfort, but exam benign today -RTC 1 year for above  Redell Burnet, MD 08/28/2023  [facility] [address] [place], [place] [address] [phone]

## 2023-08-29 ENCOUNTER — Ambulatory Visit: Payer: Self-pay | Admitting: Urology

## 2023-08-29 ENCOUNTER — Ambulatory Visit: Admitting: Physical Therapy

## 2023-08-29 ENCOUNTER — Encounter: Payer: Self-pay | Admitting: Physical Therapy

## 2023-08-29 DIAGNOSIS — M6281 Muscle weakness (generalized): Secondary | ICD-10-CM

## 2023-08-29 DIAGNOSIS — M5459 Other low back pain: Secondary | ICD-10-CM

## 2023-08-29 DIAGNOSIS — R262 Difficulty in walking, not elsewhere classified: Secondary | ICD-10-CM

## 2023-08-29 DIAGNOSIS — M47816 Spondylosis without myelopathy or radiculopathy, lumbar region: Secondary | ICD-10-CM

## 2023-08-29 DIAGNOSIS — R2681 Unsteadiness on feet: Secondary | ICD-10-CM

## 2023-08-29 LAB — TESTOSTERONE: Testosterone: 371 ng/dL (ref 264–916)

## 2023-08-29 NOTE — Therapy (Signed)
 OUTPATIENT PHYSICAL THERAPY TREATMENT   Patient Name: Tyler Glover MRN: [id number] DOB:12-29-1960, 63 y.o., male Today's Date: 08/29/2023  END OF SESSION:  PT End of Session - 08/29/23 0911     Visit Number 3    Number of Visits 13    Date for PT Re-Evaluation 10/03/23    PT Start Time 0910    PT Stop Time 0951    PT Time Calculation (min) 41 min    Behavior During Therapy [facility] for tasks assessed/performed          Past Medical History:  Diagnosis Date   Allergy    Arthritis    Rheumatoid   GERD (gastroesophageal reflux disease)    Hypertension    Past Surgical History:  Procedure Laterality Date   COLONOSCOPY     COLONOSCOPY WITH PROPOFOL  N/A 08/21/2019   Procedure: COLONOSCOPY WITH PROPOFOL ;  Surgeon: Jinny Carmine, MD;  Location: [facility];  Service: Endoscopy;  Laterality: N/A;  priority 4   ESOPHAGOGASTRODUODENOSCOPY (EGD) WITH PROPOFOL  N/A 08/21/2019   Procedure: ESOPHAGOGASTRODUODENOSCOPY (EGD) WITH PROPOFOL ;  Surgeon: Jinny Carmine, MD;  Location: [facility];  Service: Endoscopy;  Laterality: N/A;   POLYPECTOMY  08/21/2019   Procedure: POLYPECTOMY;  Surgeon: Jinny Carmine, MD;  Location: [facility];  Service: Endoscopy;;   SHOULDER SURGERY     TESTICLE SURGERY     age 67 or 29   TONSILLECTOMY     Patient Active Problem List   Diagnosis Date Noted   Encounter for screening colonoscopy    Polyp of transverse colon    Heartburn    Pure hypercholesterolemia 01/26/2016   Right lateral epicondylitis 07/07/2015   Vitamin D  deficiency 12/24/2014   Prediabetes 12/24/2014   Allergic rhinitis, seasonal 06/03/2014   Acid reflux 06/03/2014   Essential (primary) hypertension 06/03/2014   Family history of diabetes mellitus 06/03/2014   Family history of cardiac disorder 06/03/2014   Obesity (BMI 30.0-34.9) 06/03/2014    PCP: Joshua Cathryne BROCKS, MD (Inactive)  REFERRING PROVIDER: Mayur Loree Blanch, MD, PharmD  REFERRING DIAG: [phone]  Lumbar spondylosis, M54.9 Back pain  RATIONALE FOR EVALUATION AND TREATMENT: Rehabilitation  THERAPY DIAG: Other low back pain  Lumbar spondylosis  Muscle weakness (generalized)  Difficulty in walking, not elsewhere classified  Unsteadiness on feet  ONSET DATE: May 2025 (worsening symptoms, acute on chronic flare-up)  FOLLOW-UP APPT SCHEDULED WITH REFERRING PROVIDER: None currently in EMR  PERTINENT HISTORY: Pt is a 63 year old male with Parkinson's disease who ambulates at community-level without AD. Pt has new referral for lumbar spondylosis/LBP. Pt is well known to this clinic and has been attending PT with focus on gait/balance/strengthening.   Patient is s/p ESI at R L4-L5/L5-S1 neuroforamen. Pt reports acute flare-up of back pain in May of this year with worsening pain and R-sided symptoms primarily. Pt reports attempting to care for it on his own. He reports dealing with it most of this year prior to that. He feels that injections have helped. He state she still has pain along R side. Pt reports pain in R flank/R lower lumbar paraspinal region. Pt reports previous episode of L-sided sciatica - not present now. He reports no current paresthesias/numbness. Patient reports intermittent disturbed sleep due to low back.   Previous episode of care focused on gait, balance, and LLE weakness within context of Parkinson's disease. Pt met goals for outcome measures. He would still like to work on improving LLE strength and foot clearance.   PAIN:  Pain Intensity: Present: 4-6/10, Best: 0/10, Worst: 9/10 Pain location: R lower lumbar paraspinal/R flank Pain Quality: sharp  Radiating: No  Numbness/Tingling: No Focal Weakness: Yes; weakness in LLE due to neurological deficits; no R-sided weakness per pt.  Aggravating factors: leaning over countertop/desktop, bending down to pick up items, prolonged sitting > 1-2 hours Relieving factors: Double knee to chest, heat, ice, Salonpas patch,  Tylenol  24-hour pain behavior: worse late in the day  How long can you sit: painful after 1-2 hours History of prior back injury, pain, surgery, or therapy: Yes; Hx of episodic back pain managed with chiropractic and medical management/injections  Dominant hand: right Imaging: Yes ;  CLINICAL DATA:  Low back pain progressively worsening. Right thigh numbness   EXAM: MRI LUMBAR SPINE WITHOUT CONTRAST   TECHNIQUE: Multiplanar, multisequence MR imaging of the lumbar spine was performed. No intravenous contrast was administered.   COMPARISON:  None Available.   FINDINGS: Segmentation:  Standard.   Alignment:  2 mm retrolisthesis of L2 on L3.   Vertebrae: No acute fracture, evidence of discitis, or aggressive bone lesion.   Conus medullaris and cauda equina: Conus extends to the T12-L1 level. Conus and cauda equina appear normal.   Paraspinal and other soft tissues: No acute paraspinal abnormality.   Disc levels:   Disc spaces: Degenerative disease with disc height loss at L5-S1. Mild disc height loss at L1-2. Disc desiccation at L1-2, L2-3 and L3-4.   T11-12: Mild broad-based disc bulge. No foraminal or central canal stenosis.   T12-L1: No significant disc bulge. No neural foraminal stenosis. No central canal stenosis. Mild bilateral facet arthropathy.   L1-L2: Mild broad-based disc bulge. Mild bilateral facet arthropathy. No foraminal or central canal stenosis.   L2-L3: Broad-based disc bulge. Mild bilateral facet arthropathy. Bilateral lateral recess narrowing. Minimal spinal stenosis. No foraminal stenosis.   L3-L4: Mild broad-based disc bulge. Mild bilateral facet arthropathy. No foraminal or central canal stenosis.   L4-L5: No significant disc bulge. No neural foraminal stenosis. No central canal stenosis. Mild bilateral facet arthropathy.   L5-S1: Broad-based disc osteophyte complex with a focal prominent right far lateral disc osteophyte complex. No  foraminal or central canal stenosis.   IMPRESSION: 1. Lumbar spine spondylosis as described above. 2. No acute osseous injury of the lumbar spine.   Red flags: Negative for bowel/bladder changes, saddle paresthesia, personal history of cancer, h/o spinal tumors, h/o compression fx, h/o abdominal aneurysm, abdominal pain, chills/fever, night sweats, nausea, vomiting, unrelenting pain, first onset of insidious LBP <20 y/o  PRECAUTIONS: None  WEIGHT BEARING RESTRICTIONS: No  FALLS: Has patient fallen in last 6 months? No  Living Environment Lives with: lives with their spouse and mother  Lives in: House/apartment; one step to get into home; stairs to get into home, pt has upstairs bedroom. Pt reports doing fairly well with stairs. 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   Hobbies: Visiting museums, sight-seeing, travel  Patient Goals: Reduced pain, improved movement     OBJECTIVE (data from initial evaluation unless otherwise dated):   Patient Surveys  ODI = 10/50 = 20%  GAIT: Distance walked: 40 ft Assistive device utilized: None Level of assistance: SBA Comments: Forward-flexed posture, dec step length  Posture: Moderate thoracic kyphosis and forward head, slouched posture c Hx of Parkinson's  Lumbar lordosis: WNL Iliac crest height: Equal bilaterally Lumbar lateral shift: Negative  AROM AROM (Normal range in degrees) AROM  08/21/23  Lumbar   Flexion (65) 75%*  Extension (30) 75%*  Right lateral flexion (25) 75%  Left lateral flexion (25) 75%  Right rotation (30) 100%  Left rotation (30) 50%      Hip Right Left  Flexion (125) WNL WNL  Extension (15)    Abduction (40)    Adduction     Internal Rotation (45)    External Rotation (45)        (* = pain;  Blank rows = not tested)  LE MMT: MMT (out of 5) Right 08/21/23 Left 08/21/23  Hip flexion 5 5  Hip extension    Hip abduction    Hip adduction    Hip internal rotation    Hip external rotation    Knee flexion 5 5  Knee extension 5 5-  Ankle dorsiflexion 4 4  Ankle plantarflexion    Ankle inversion    Ankle eversion    (* = pain; Blank rows = not tested)  Sensation Grossly intact to light touch throughout bilateral LEs as determined by testing dermatomes L2-S2. Proprioception, stereognosis, and hot/cold testing deferred on this date.  Reflexes R/L Knee Jerk (L3/4): 2+/2+  Ankle Jerk (S1/2): Unable to obtain  Clonus: Negative bilat   Muscle Length Hamstrings: R: Positive L: Positive [place] (quadriceps): R: Positive L: Positive  Palpation Location Right Left         Lumbar paraspinals 2   Quadratus Lumborum 1   Gluteus Maximus 1   Gluteus Medius 0   Deep hip external rotators 0   PSIS 0   Fortin's Area (SIJ) 0   Greater Trochanter    (Blank rows = not tested) Graded on 0-4 scale (0 = no pain, 1 = pain, 2 = pain with wincing/grimacing/flinching, 3 = pain with withdrawal, 4 = unwilling to allow palpation)  Special Tests Lumbar Radiculopathy and Discogenic: Centralization and Peripheralization (SN 92, -LR 0.12): Not examined.  Slump (SN 83, -LR 0.32): R: Negative L: Negative SLR (SN 92, -LR 0.29): R: Negative L:  Negative   Facet Joint: Extension-Rotation (SN 100, -LR 0.0): R: Negative L: Negative  Lumbar Foraminal Stenosis: Lumbar quadrant (SN 70): R: Negative L: Negative  Hip: FABER (SN 81): R: Negative (pain with release of FABER on R;  L: Negative   Passive Accessory Intervertebral Motion Pain at restriction L3-5 CPA, moderate hypomobility of lower lumbar spine.     TODAY'S TREATMENT: DATE: 08/29/2023    SUBJECTIVE STATEMENT:   Patient reports that last visit helped notably. 1-2/10 pain along R lower lumbar parsapinal. Patient reports he is happy with  improvement this week. Pt is compliant with HEP.    AROM Lumbar flexion 75%* (distal tibia) Lumbar extension 50%* Lateral flexion: R 75%, L WNL Thoracolumbar rotation: R 75%, L 75%    Manual Therapy - for symptom modulation, soft tissue sensitivity and mobility, joint mobility, ROM   STM/DTM R iliocostalis lumborum; x 10 minutes  *not today* Hooklying lumbar traction, 10-sec intermittent holds; x 6 minutes, PT at foot of treatment table   Therapeutic Exercise - for improved soft tissue flexibility and extensibility as needed for ROM, improved strength as needed to improve performance of CKC activities/functional movements, MDT/repeated movement for symptom modulation  Repeated extension in standing, 1 x 10: reproduction of sharp R paraspinal pain, no change/no worse after   -trialed for HEP alternative Repeated  extension in lying, 1 x 10: no effect during, better after   Lower trunk rotations, hooklying; 1 x 10 alt R/L  Open book; 1 x 10 R/L  Bridge; 1 x 8  -progress volume next visit  PATIENT EDUCATION: Discussed expectations with repeated movement program, encouraged continued HEP and activity modification.    *not today* Lower trunk rotations; with QL bias; x 10 (LLE crossed to stretch R QL today), 3 sec hold   PATIENT EDUCATION:  Education details: see above for patient education details Person educated: Patient Education method: Explanation, Demonstration, and Handouts Education comprehension: verbalized understanding and returned demonstration   HOME EXERCISE PROGRAM:  Access Code: [email or web address] URL: [email or web address] Date: 08/27/2023 Prepared by: Venetia Endo  Exercises - Prone Press Up  - 5-6 x daily - 7 x weekly - 1 sets - 10 reps - 1sec hold - Supine Quadratus Lumborum Stretch  - 2 x daily - 7 x weekly - 2 sets - 10 reps - 2-3sec hold - Seated Hamstring Stretch  - 2 x daily - 7 x weekly - 3 sets - 30sec hold   ASSESSMENT:  CLINICAL  IMPRESSION: Patient fortunately responded well with intervention earlier this week. He has relatively low NPRS at arrival and exhibits improved tolerance of forward flexion lumbar AROM; pt also demonstrates only mild motion loss with R lateral flexion and bilat rotation. Truncal rigidity is expected to some extent with Parkinson's disease. Pt reports no notable symptoms while in lying today and has lower pain level overall. Pt has current deficits in thoracolumbar AROM, decreased LLE strength, lumbar spine stiffness/hypomobility, posterior chain tightness/dec flexibility. Pt will continue to benefit from skilled PT services to address deficits and improve function.   OBJECTIVE IMPAIRMENTS: Abnormal gait, decreased mobility, decreased ROM, decreased strength, hypomobility, impaired flexibility, postural dysfunction, and pain.   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   REHAB POTENTIAL: Good  CLINICAL DECISION MAKING: Evolving/moderate complexity  EVALUATION COMPLEXITY: Moderate   GOALS: Goals reviewed with patient? Yes  SHORT TERM GOALS: Target date: 09/12/2023  Pt will be independent with HEP in order to improve strength and decrease back pain to improve pain-free function at home and work. Baseline: 08/21/23: Baseline HEP initiated.  Goal status: INITIAL   LONG TERM GOALS: Target date: 10/10/2023  Patient will have full thoracolumbar AROM without reproduction of pain as needed for reaching items on ground, household chores, bending Baseline: 08/21/23: Motion loss and pain with flexion, motion loss with  extension, bilateral lateral flexion, L rotation.  Goal status: INITIAL  2.  Pt will decrease worst back pain by at least 2 points on the NPRS in order to demonstrate clinically significant reduction in back pain. Baseline: 08/21/23: 9/10  Goal status: INITIAL  3.  Pt will decrease mODI score by at least 13 points in order demonstrate clinically significant reduction in back pain/disability.       Baseline: 08/21/23: 10/50 = 20% Goal status: INITIAL  4.  Pt will tolerate sitting for leisure activity and traveling up to 1 hour without reproduction of pain.  Baseline: 08/21/23: Pain with sitting > 1-2 hr.  Goal status: INITIAL   PLAN: PT FREQUENCY: 1-2x/week  PT DURATION: 6 weeks  PLANNED INTERVENTIONS: Therapeutic exercises, Therapeutic activity, Neuromuscular re-education,  Balance training, Gait training, Patient/Family education, Self Care, Joint mobilization, Joint manipulation, Vestibular training, Canalith repositioning, Orthotic/Fit training, DME instructions, Dry Needling, Electrical stimulation, Spinal manipulation, Spinal mobilization, Cryotherapy, Moist heat, Taping, Traction, Ultrasound, Ionotophoresis 4mg /ml Dexamethasone, Manual therapy, and Re-evaluation.  PLAN FOR NEXT SESSION: Continue with thoracolumbar mobility. Manual techniques/DTM along R QL/iliocostalis lumborum.    Venetia Endo, PT, DPT #[id number]  Venetia ONEIDA Endo, PT 08/29/2023, 12:51 PM

## 2023-09-03 ENCOUNTER — Ambulatory Visit: Admitting: Physical Therapy

## 2023-09-03 DIAGNOSIS — M47816 Spondylosis without myelopathy or radiculopathy, lumbar region: Secondary | ICD-10-CM

## 2023-09-03 DIAGNOSIS — M5459 Other low back pain: Secondary | ICD-10-CM

## 2023-09-03 DIAGNOSIS — M6281 Muscle weakness (generalized): Secondary | ICD-10-CM

## 2023-09-03 DIAGNOSIS — R262 Difficulty in walking, not elsewhere classified: Secondary | ICD-10-CM | POA: Diagnosis not present

## 2023-09-03 NOTE — Therapy (Unsigned)
 OUTPATIENT PHYSICAL THERAPY TREATMENT   Patient Name: Tyler Glover MRN: [id number] DOB:[birth year], 63 y.o., male Today's Date: 09/03/2023  END OF SESSION:  PT End of Session - 09/03/23 0918     Visit Number 4    Number of Visits 13    Date for PT Re-Evaluation 10/03/23    PT Start Time 0909    PT Stop Time 0951    PT Time Calculation (min) 42 min    Behavior During Therapy [facility] for tasks assessed/performed          Past Medical History:  Diagnosis Date   Allergy    Arthritis    Rheumatoid   GERD (gastroesophageal reflux disease)    Hypertension    Past Surgical History:  Procedure Laterality Date   COLONOSCOPY     COLONOSCOPY WITH PROPOFOL  N/A 08/21/2019   Procedure: COLONOSCOPY WITH PROPOFOL ;  Surgeon: Jinny Carmine, MD;  Location: [facility];  Service: Endoscopy;  Laterality: N/A;  priority 4   ESOPHAGOGASTRODUODENOSCOPY (EGD) WITH PROPOFOL  N/A 08/21/2019   Procedure: ESOPHAGOGASTRODUODENOSCOPY (EGD) WITH PROPOFOL ;  Surgeon: Jinny Carmine, MD;  Location: [facility];  Service: Endoscopy;  Laterality: N/A;   POLYPECTOMY  08/21/2019   Procedure: POLYPECTOMY;  Surgeon: Jinny Carmine, MD;  Location: [facility];  Service: Endoscopy;;   SHOULDER SURGERY     TESTICLE SURGERY     age 56 or 52   TONSILLECTOMY     Patient Active Problem List   Diagnosis Date Noted   Encounter for screening colonoscopy    Polyp of transverse colon    Heartburn    Pure hypercholesterolemia 01/26/2016   Right lateral epicondylitis 07/07/2015   Vitamin D  deficiency 12/24/2014   Prediabetes 12/24/2014   Allergic rhinitis, seasonal 06/03/2014   Acid reflux 06/03/2014   Essential (primary) hypertension 06/03/2014   Family history of diabetes mellitus 06/03/2014   Family history of cardiac disorder 06/03/2014   Obesity (BMI 30.0-34.9) 06/03/2014    PCP: Joshua Cathryne BROCKS, MD (Inactive)  REFERRING PROVIDER: Mayur Loree Blanch, MD, PharmD  REFERRING DIAG: [phone]  Lumbar spondylosis, M54.9 Back pain  RATIONALE FOR EVALUATION AND TREATMENT: Rehabilitation  THERAPY DIAG: Other low back pain  Lumbar spondylosis  Muscle weakness (generalized)  ONSET DATE: May 2025 (worsening symptoms, acute on chronic flare-up)  FOLLOW-UP APPT SCHEDULED WITH REFERRING PROVIDER: None currently in EMR  PERTINENT HISTORY: Pt is a 63 year old male with Parkinson's disease who ambulates at community-level without AD. Pt has new referral for lumbar spondylosis/LBP. Pt is well known to this clinic and has been attending PT with focus on gait/balance/strengthening.   Patient is s/p ESI at R L4-L5/L5-S1 neuroforamen. Pt reports acute flare-up of back pain in May of this year with worsening pain and R-sided symptoms primarily. Pt reports attempting to care for it on his own. He reports dealing with it most of this year prior to that. He feels that injections have helped. He state she still has pain along R side. Pt reports pain in R flank/R lower lumbar paraspinal region. Pt reports previous episode of L-sided sciatica - not present now. He reports no current paresthesias/numbness. Patient reports intermittent disturbed sleep due to low back.   Previous episode of care focused on gait, balance, and LLE weakness within context of Parkinson's disease. Pt met goals for outcome measures. He would still like to work on improving LLE strength and foot clearance.   PAIN:    Pain Intensity: Present: 4-6/10, Best: 0/10, Worst: 9/10 Pain location: R  lower lumbar paraspinal/R flank Pain Quality: sharp  Radiating: No  Numbness/Tingling: No Focal Weakness: Yes; weakness in LLE due to neurological deficits; no R-sided weakness per pt.  Aggravating factors: leaning over countertop/desktop, bending down to pick up items, prolonged sitting > 1-2 hours Relieving factors: Double knee to chest, heat, ice, Salonpas patch, Tylenol  24-hour pain behavior: worse late in the day  How long can you sit:  painful after 1-2 hours History of prior back injury, pain, surgery, or therapy: Yes; Hx of episodic back pain managed with chiropractic and medical management/injections  Dominant hand: right Imaging: Yes ;  CLINICAL DATA:  Low back pain progressively worsening. Right thigh numbness   EXAM: MRI LUMBAR SPINE WITHOUT CONTRAST   TECHNIQUE: Multiplanar, multisequence MR imaging of the lumbar spine was performed. No intravenous contrast was administered.   COMPARISON:  None Available.   FINDINGS: Segmentation:  Standard.   Alignment:  2 mm retrolisthesis of L2 on L3.   Vertebrae: No acute fracture, evidence of discitis, or aggressive bone lesion.   Conus medullaris and cauda equina: Conus extends to the T12-L1 level. Conus and cauda equina appear normal.   Paraspinal and other soft tissues: No acute paraspinal abnormality.   Disc levels:   Disc spaces: Degenerative disease with disc height loss at L5-S1. Mild disc height loss at L1-2. Disc desiccation at L1-2, L2-3 and L3-4.   T11-12: Mild broad-based disc bulge. No foraminal or central canal stenosis.   T12-L1: No significant disc bulge. No neural foraminal stenosis. No central canal stenosis. Mild bilateral facet arthropathy.   L1-L2: Mild broad-based disc bulge. Mild bilateral facet arthropathy. No foraminal or central canal stenosis.   L2-L3: Broad-based disc bulge. Mild bilateral facet arthropathy. Bilateral lateral recess narrowing. Minimal spinal stenosis. No foraminal stenosis.   L3-L4: Mild broad-based disc bulge. Mild bilateral facet arthropathy. No foraminal or central canal stenosis.   L4-L5: No significant disc bulge. No neural foraminal stenosis. No central canal stenosis. Mild bilateral facet arthropathy.   L5-S1: Broad-based disc osteophyte complex with a focal prominent right far lateral disc osteophyte complex. No foraminal or central canal stenosis.   IMPRESSION: 1. Lumbar spine spondylosis  as described above. 2. No acute osseous injury of the lumbar spine.   Red flags: Negative for bowel/bladder changes, saddle paresthesia, personal history of cancer, h/o spinal tumors, h/o compression fx, h/o abdominal aneurysm, abdominal pain, chills/fever, night sweats, nausea, vomiting, unrelenting pain, first onset of insidious LBP <20 y/o  PRECAUTIONS: None  WEIGHT BEARING RESTRICTIONS: No  FALLS: Has patient fallen in last 6 months? No  Living Environment Lives with: lives with their spouse and mother  Lives in: House/apartment; one step to get into home; stairs to get into home, pt has upstairs bedroom. Pt reports doing fairly well with stairs. 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   Hobbies: Visiting museums, sight-seeing, travel  Patient Goals: Reduced pain, improved movement     OBJECTIVE (data from initial evaluation unless otherwise dated):   Patient Surveys  ODI = 10/50 = 20%  GAIT: Distance walked: 40 ft Assistive device utilized: None Level of assistance: SBA Comments: Forward-flexed posture, dec step length  Posture: Moderate thoracic kyphosis and forward head, slouched posture c Hx of Parkinson's Lumbar lordosis: WNL Iliac crest height: Equal bilaterally Lumbar lateral shift:  Negative  AROM AROM (Normal range in degrees) AROM  08/21/23  Lumbar   Flexion (65) 75%*  Extension (30) 75%*  Right lateral flexion (25) 75%  Left lateral flexion (25) 75%  Right rotation (30) 100%  Left rotation (30) 50%      Hip Right Left  Flexion (125) WNL WNL  Extension (15)    Abduction (40)    Adduction     Internal Rotation (45)    External Rotation (45)        (* = pain; Blank rows = not tested)  LE MMT: MMT (out of 5) Right 08/21/23 Left 08/21/23  Hip  flexion 5 5  Hip extension    Hip abduction    Hip adduction    Hip internal rotation    Hip external rotation    Knee flexion 5 5  Knee extension 5 5-  Ankle dorsiflexion 4 4  Ankle plantarflexion    Ankle inversion    Ankle eversion    (* = pain; Blank rows = not tested)  Sensation Grossly intact to light touch throughout bilateral LEs as determined by testing dermatomes L2-S2. Proprioception, stereognosis, and hot/cold testing deferred on this date.  Reflexes R/L Knee Jerk (L3/4): 2+/2+  Ankle Jerk (S1/2): Unable to obtain  Clonus: Negative bilat   Muscle Length Hamstrings: R: Positive L: Positive [place] (quadriceps): R: Positive L: Positive  Palpation Location Right Left         Lumbar paraspinals 2   Quadratus Lumborum 1   Gluteus Maximus 1   Gluteus Medius 0   Deep hip external rotators 0   PSIS 0   Fortin's Area (SIJ) 0   Greater Trochanter    (Blank rows = not tested) Graded on 0-4 scale (0 = no pain, 1 = pain, 2 = pain with wincing/grimacing/flinching, 3 = pain with withdrawal, 4 = unwilling to allow palpation)  Special Tests Lumbar Radiculopathy and Discogenic: Centralization and Peripheralization (SN 92, -LR 0.12): Not examined.  Slump (SN 83, -LR 0.32): R: Negative L: Negative SLR (SN 92, -LR 0.29): R: Negative L:  Negative   Facet Joint: Extension-Rotation (SN 100, -LR 0.0): R: Negative L: Negative  Lumbar Foraminal Stenosis: Lumbar quadrant (SN 70): R: Negative L: Negative  Hip: FABER (SN 81): R: Negative (pain with release of FABER on R;  L: Negative   Passive Accessory Intervertebral Motion Pain at restriction L3-5 CPA, moderate hypomobility of lower lumbar spine.     TODAY'S TREATMENT: DATE: 09/03/2023    SUBJECTIVE STATEMENT:   Patient reports 3/10 pain at arrival. He reports completing his HEP, but not every day with him being busy over the weekend. He reports 50% improvement in pain overall since starting this episode of care for  low back pain.    AROM Lumbar flexion 75%* (anterior ankle), pain with return to neutral  Lumbar extension 75%*, pain with return to neutral Lateral flexion: R 75%, L WNL Thoracolumbar rotation: R WNL, L 75%    Manual Therapy - for symptom modulation, soft tissue sensitivity and mobility, joint mobility, ROM   STM/DTM R iliocostalis lumborum; x 15 minutes  *not today* Hooklying lumbar traction, 10-sec intermittent holds; x 6 minutes, PT at foot of treatment table   Therapeutic Exercise - for improved soft tissue flexibility and extensibility as needed for ROM, improved strength as needed to improve performance of CKC activities/functional movements, MDT/repeated movement for symptom modulation  NuStep; Level 3, x 5 minutes - for improved soft tissue mobility and  increased tissue temperature to improve muscle performance   -subjective gathered during this time  -MHP along low back during time on bike  Repeated extension in lying, 1 x 10: no effect during, better after   Open book; 1 x 10 R and L side  Bridge; 2 x 8  -progress volume next visit  PATIENT EDUCATION: Discussed expectations with repeated movement program, encouraged continued HEP and activity modification.    *not today* Lower trunk rotations, hooklying; 1 x 10 alt R/L Lower trunk rotations; with QL bias; x 10 (LLE crossed to stretch R QL today), 3 sec hold   PATIENT EDUCATION:  Education details: see above for patient education details Person educated: Patient Education method: Explanation, Demonstration, and Handouts Education comprehension: verbalized understanding and returned demonstration   HOME EXERCISE PROGRAM:  Access Code: [email or web address] URL: [email or web address] Date: 08/27/2023 Prepared by: Venetia Endo  Exercises - Prone Press Up  - 5-6 x daily - 7 x weekly - 1 sets - 10 reps - 1sec hold - Supine Quadratus Lumborum Stretch  - 2 x daily - 7 x weekly - 2 sets - 10 reps - 2-3sec  hold - Seated Hamstring Stretch  - 2 x daily - 7 x weekly - 3 sets - 30sec hold   ASSESSMENT:  CLINICAL IMPRESSION: Patient has responded well with PT early in POC. He reports largely lower NPRS and has mild sensitivity along R flank remaining today. Pt is responding well with extension program and is tolerating this well at home; pt is sometimes inconsistent with HEP, and we discussed today improved benefit with daily home exercises.  Pt has current deficits in thoracolumbar AROM, decreased LLE strength, lumbar spine stiffness/hypomobility, posterior chain tightness/dec flexibility. Pt will continue to benefit from skilled PT services to address deficits and improve function.   OBJECTIVE IMPAIRMENTS: Abnormal gait, decreased mobility, decreased ROM, decreased strength, hypomobility, impaired flexibility, postural dysfunction, and pain.   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   REHAB POTENTIAL: Good  CLINICAL DECISION MAKING: Evolving/moderate complexity  EVALUATION COMPLEXITY: Moderate   GOALS: Goals reviewed with patient? Yes  SHORT TERM GOALS: Target date: 09/12/2023  Pt will be independent with HEP in order to improve strength and decrease back pain to improve pain-free function at home and work. Baseline: 08/21/23: Baseline HEP initiated.  Goal status: INITIAL   LONG TERM GOALS: Target date: 10/10/2023  Patient will have full thoracolumbar AROM without reproduction of pain as needed for reaching items on ground, household chores, bending Baseline: 08/21/23: Motion loss and pain with flexion,  motion loss with extension, bilateral lateral flexion, L rotation.  Goal status: INITIAL  2.  Pt will decrease worst back pain by at least 2 points on the NPRS in order to demonstrate clinically significant reduction in back pain. Baseline: 08/21/23: 9/10  Goal status: INITIAL  3.  Pt will decrease mODI score by at least 13 points in order demonstrate clinically significant reduction in back pain/disability.       Baseline: 08/21/23: 10/50 = 20% Goal status: INITIAL  4.  Pt will tolerate sitting for leisure activity and traveling up to 1 hour without reproduction of pain.  Baseline: 08/21/23: Pain with sitting > 1-2 hr.  Goal status: INITIAL   PLAN:  PT FREQUENCY: 1-2x/week  PT DURATION: 6 weeks  PLANNED INTERVENTIONS: Therapeutic exercises, Therapeutic activity, Neuromuscular re-education, Balance training, Gait training, Patient/Family education, Self Care, Joint mobilization, Joint manipulation, Vestibular training, Canalith repositioning, Orthotic/Fit training, DME instructions, Dry Needling, Electrical stimulation, Spinal manipulation, Spinal mobilization, Cryotherapy, Moist heat, Taping, Traction, Ultrasound, Ionotophoresis 4mg /ml Dexamethasone, Manual therapy, and Re-evaluation.  PLAN FOR NEXT SESSION: Continue with thoracolumbar mobility. Manual techniques/DTM along R QL/iliocostalis lumborum.    Venetia Endo, PT, DPT #[id number]  Venetia ONEIDA Endo, PT 09/03/2023, 9:51 AM

## 2023-09-04 ENCOUNTER — Encounter: Payer: Self-pay | Admitting: Physical Therapy

## 2023-09-05 ENCOUNTER — Ambulatory Visit: Admitting: Physical Therapy

## 2023-09-05 DIAGNOSIS — M6281 Muscle weakness (generalized): Secondary | ICD-10-CM

## 2023-09-05 DIAGNOSIS — M47816 Spondylosis without myelopathy or radiculopathy, lumbar region: Secondary | ICD-10-CM

## 2023-09-05 DIAGNOSIS — M5459 Other low back pain: Secondary | ICD-10-CM

## 2023-09-05 DIAGNOSIS — R262 Difficulty in walking, not elsewhere classified: Secondary | ICD-10-CM | POA: Diagnosis not present

## 2023-09-05 NOTE — Therapy (Unsigned)
 OUTPATIENT PHYSICAL THERAPY TREATMENT   Patient Name: Tyler Glover MRN: [id number] DOB:Jul 03, 1960, 63 y.o., male Today's Date: 09/05/2023  END OF SESSION:  PT End of Session - 09/05/23 0901     Visit Number 5    Number of Visits 13    Date for PT Re-Evaluation 10/03/23    PT Start Time 0901    PT Stop Time 0945    PT Time Calculation (min) 44 min    Behavior During Therapy [facility] for tasks assessed/performed           Past Medical History:  Diagnosis Date   Allergy    Arthritis    Rheumatoid   GERD (gastroesophageal reflux disease)    Hypertension    Past Surgical History:  Procedure Laterality Date   COLONOSCOPY     COLONOSCOPY WITH PROPOFOL  N/A 08/21/2019   Procedure: COLONOSCOPY WITH PROPOFOL ;  Surgeon: Jinny Carmine, MD;  Location: [facility];  Service: Endoscopy;  Laterality: N/A;  priority 4   ESOPHAGOGASTRODUODENOSCOPY (EGD) WITH PROPOFOL  N/A 08/21/2019   Procedure: ESOPHAGOGASTRODUODENOSCOPY (EGD) WITH PROPOFOL ;  Surgeon: Jinny Carmine, MD;  Location: [facility];  Service: Endoscopy;  Laterality: N/A;   POLYPECTOMY  08/21/2019   Procedure: POLYPECTOMY;  Surgeon: Jinny Carmine, MD;  Location: [facility];  Service: Endoscopy;;   SHOULDER SURGERY     TESTICLE SURGERY     age 46 or 22   TONSILLECTOMY     Patient Active Problem List   Diagnosis Date Noted   Encounter for screening colonoscopy    Polyp of transverse colon    Heartburn    Pure hypercholesterolemia 01/26/2016   Right lateral epicondylitis 07/07/2015   Vitamin D  deficiency 12/24/2014   Prediabetes 12/24/2014   Allergic rhinitis, seasonal 06/03/2014   Acid reflux 06/03/2014   Essential (primary) hypertension 06/03/2014   Family history of diabetes mellitus 06/03/2014   Family history of cardiac disorder 06/03/2014   Obesity (BMI 30.0-34.9) 06/03/2014    PCP: Joshua Cathryne BROCKS, MD (Inactive)  REFERRING PROVIDER: Mayur Loree Blanch, MD, PharmD  REFERRING DIAG: [phone]  Lumbar spondylosis, M54.9 Back pain  RATIONALE FOR EVALUATION AND TREATMENT: Rehabilitation  THERAPY DIAG: Other low back pain  Lumbar spondylosis  Muscle weakness (generalized)  ONSET DATE: May 2025 (worsening symptoms, acute on chronic flare-up)  FOLLOW-UP APPT SCHEDULED WITH REFERRING PROVIDER: None currently in EMR  PERTINENT HISTORY: Pt is a 63 year old male with Parkinson's disease who ambulates at community-level without AD. Pt has new referral for lumbar spondylosis/LBP. Pt is well known to this clinic and has been attending PT with focus on gait/balance/strengthening.   Patient is s/p ESI at R L4-L5/L5-S1 neuroforamen. Pt reports acute flare-up of back pain in May of this year with worsening pain and R-sided symptoms primarily. Pt reports attempting to care for it on his own. He reports dealing with it most of this year prior to that. He feels that injections have helped. He state she still has pain along R side. Pt reports pain in R flank/R lower lumbar paraspinal region. Pt reports previous episode of L-sided sciatica - not present now. He reports no current paresthesias/numbness. Patient reports intermittent disturbed sleep due to low back.   Previous episode of care focused on gait, balance, and LLE weakness within context of Parkinson's disease. Pt met goals for outcome measures. He would still like to work on improving LLE strength and foot clearance.   PAIN:    Pain Intensity: Present: 4-6/10, Best: 0/10, Worst: 9/10 Pain location:  R lower lumbar paraspinal/R flank Pain Quality: sharp  Radiating: No  Numbness/Tingling: No Focal Weakness: Yes; weakness in LLE due to neurological deficits; no R-sided weakness per pt.  Aggravating factors: leaning over countertop/desktop, bending down to pick up items, prolonged sitting > 1-2 hours Relieving factors: Double knee to chest, heat, ice, Salonpas patch, Tylenol  24-hour pain behavior: worse late in the day  How long can you sit:  painful after 1-2 hours History of prior back injury, pain, surgery, or therapy: Yes; Hx of episodic back pain managed with chiropractic and medical management/injections  Dominant hand: right Imaging: Yes ;  CLINICAL DATA:  Low back pain progressively worsening. Right thigh numbness   EXAM: MRI LUMBAR SPINE WITHOUT CONTRAST   TECHNIQUE: Multiplanar, multisequence MR imaging of the lumbar spine was performed. No intravenous contrast was administered.   COMPARISON:  None Available.   FINDINGS: Segmentation:  Standard.   Alignment:  2 mm retrolisthesis of L2 on L3.   Vertebrae: No acute fracture, evidence of discitis, or aggressive bone lesion.   Conus medullaris and cauda equina: Conus extends to the T12-L1 level. Conus and cauda equina appear normal.   Paraspinal and other soft tissues: No acute paraspinal abnormality.   Disc levels:   Disc spaces: Degenerative disease with disc height loss at L5-S1. Mild disc height loss at L1-2. Disc desiccation at L1-2, L2-3 and L3-4.   T11-12: Mild broad-based disc bulge. No foraminal or central canal stenosis.   T12-L1: No significant disc bulge. No neural foraminal stenosis. No central canal stenosis. Mild bilateral facet arthropathy.   L1-L2: Mild broad-based disc bulge. Mild bilateral facet arthropathy. No foraminal or central canal stenosis.   L2-L3: Broad-based disc bulge. Mild bilateral facet arthropathy. Bilateral lateral recess narrowing. Minimal spinal stenosis. No foraminal stenosis.   L3-L4: Mild broad-based disc bulge. Mild bilateral facet arthropathy. No foraminal or central canal stenosis.   L4-L5: No significant disc bulge. No neural foraminal stenosis. No central canal stenosis. Mild bilateral facet arthropathy.   L5-S1: Broad-based disc osteophyte complex with a focal prominent right far lateral disc osteophyte complex. No foraminal or central canal stenosis.   IMPRESSION: 1. Lumbar spine spondylosis  as described above. 2. No acute osseous injury of the lumbar spine.   Red flags: Negative for bowel/bladder changes, saddle paresthesia, personal history of cancer, h/o spinal tumors, h/o compression fx, h/o abdominal aneurysm, abdominal pain, chills/fever, night sweats, nausea, vomiting, unrelenting pain, first onset of insidious LBP <20 y/o  PRECAUTIONS: None  WEIGHT BEARING RESTRICTIONS: No  FALLS: Has patient fallen in last 6 months? No  Living Environment Lives with: lives with their spouse and mother  Lives in: House/apartment; one step to get into home; stairs to get into home, pt has upstairs bedroom. Pt reports doing fairly well with stairs. 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   Hobbies: Visiting museums, sight-seeing, travel  Patient Goals: Reduced pain, improved movement     OBJECTIVE (data from initial evaluation unless otherwise dated):   Patient Surveys  ODI = 10/50 = 20%  GAIT: Distance walked: 40 ft Assistive device utilized: None Level of assistance: SBA Comments: Forward-flexed posture, dec step length  Posture: Moderate thoracic kyphosis and forward head, slouched posture c Hx of Parkinson's Lumbar lordosis: WNL Iliac crest height: Equal bilaterally Lumbar lateral  shift: Negative  AROM AROM (Normal range in degrees) AROM  08/21/23  Lumbar   Flexion (65) 75%*  Extension (30) 75%*  Right lateral flexion (25) 75%  Left lateral flexion (25) 75%  Right rotation (30) 100%  Left rotation (30) 50%      Hip Right Left  Flexion (125) WNL WNL  Extension (15)    Abduction (40)    Adduction     Internal Rotation (45)    External Rotation (45)        (* = pain; Blank rows = not tested)  LE MMT: MMT (out of 5) Right 08/21/23 Left 08/21/23  Hip  flexion 5 5  Hip extension    Hip abduction    Hip adduction    Hip internal rotation    Hip external rotation    Knee flexion 5 5  Knee extension 5 5-  Ankle dorsiflexion 4 4  Ankle plantarflexion    Ankle inversion    Ankle eversion    (* = pain; Blank rows = not tested)  Sensation Grossly intact to light touch throughout bilateral LEs as determined by testing dermatomes L2-S2. Proprioception, stereognosis, and hot/cold testing deferred on this date.  Reflexes R/L Knee Jerk (L3/4): 2+/2+  Ankle Jerk (S1/2): Unable to obtain  Clonus: Negative bilat   Muscle Length Hamstrings: R: Positive L: Positive E[place] (quadriceps): R: Positive L: Positive  Palpation Location Right Left         Lumbar paraspinals 2   Quadratus Lumborum 1   Gluteus Maximus 1   Gluteus Medius 0   Deep hip external rotators 0   PSIS 0   Fortin's Area (SIJ) 0   Greater Trochanter    (Blank rows = not tested) Graded on 0-4 scale (0 = no pain, 1 = pain, 2 = pain with wincing/grimacing/flinching, 3 = pain with withdrawal, 4 = unwilling to allow palpation)  Special Tests Lumbar Radiculopathy and Discogenic: Centralization and Peripheralization (SN 92, -LR 0.12): Not examined.  Slump (SN 83, -LR 0.32): R: Negative L: Negative SLR (SN 92, -LR 0.29): R: Negative L:  Negative   Facet Joint: Extension-Rotation (SN 100, -LR 0.0): R: Negative L: Negative  Lumbar Foraminal Stenosis: Lumbar quadrant (SN 70): R: Negative L: Negative  Hip: FABER (SN 81): R: Negative (pain with release of FABER on R;  L: Negative   Passive Accessory Intervertebral Motion Pain at restriction L3-5 CPA, moderate hypomobility of lower lumbar spine.     TODAY'S TREATMENT: DATE: 09/05/2023    SUBJECTIVE STATEMENT:   Patient reports 6/10 pain after lifting full 5-gallon bucket and completing lifting/trunk rotation simultaneously; he reports notable increase in pain yesterday with this. Pain is in same region per patient. No  numbness/tingling. Pt reports he was still able to complete HEP.     MHP (unbilled) utilized for analgesic effect and improved soft tissue extensibility, x 5 minutes.   Manual Therapy - for symptom modulation, soft tissue sensitivity and mobility, joint mobility, ROM   Hooklying lumbar traction, 10-sec intermittent holds; x 6 minutes, PT at foot of treatment table STM and IASTM with Hypervolt along R longissimus/iliocostalis lumborum and R gluteal musculature; x 10 minutes   Therapeutic Exercise - for improved soft tissue flexibility and extensibility as needed for ROM, improved strength as needed to improve performance of CKC activities/functional movements, MDT/repeated movement for symptom modulation  NuStep; Level 3, x 5 minutes - for improved soft tissue mobility and increased tissue temperature to improve muscle performance   -  subjective gathered during this time  -MHP along low back during time on bike  Lower trunk rotations, hooklying; 1 x 10 alt R/L  Open book; 1 x 10 R and L side  Bridge; 2 x 8  -progress volume next visit  PATIENT EDUCATION: Discussed expectations with repeated movement program, encouraged continued HEP and activity modification.    *not today* Repeated extension in lying, 1 x 10: no effect during, better after  Lower trunk rotations; with QL bias; x 10 (LLE crossed to stretch R QL today), 3 sec hold   PATIENT EDUCATION:  Education details: see above for patient education details Person educated: Patient Education method: Explanation, Demonstration, and Handouts Education comprehension: verbalized understanding and returned demonstration   HOME EXERCISE PROGRAM:  Access Code: [email or web address] URL: [email or web address] Date: 08/27/2023 Prepared by: Venetia Endo  Exercises - Prone Press Up  - 5-6 x daily - 7 x weekly - 1 sets - 10 reps - 1sec hold - Supine Quadratus Lumborum Stretch  - 2 x daily - 7 x weekly - 2 sets - 10 reps -  2-3sec hold - Seated Hamstring Stretch  - 2 x daily - 7 x weekly - 3 sets - 30sec hold   ASSESSMENT:  CLINICAL IMPRESSION: Patient had flare-up of R flank pain with lifting full bucket yesterday in combination with trunk rotation. We discussed ergonomic principles/body mechanics today and worked on manual therapy and brief use of moist heat to decrease R flank pain. Pt has 4/10 pain following session - significantly reduced NPRS. Pt was also educated on self-traction technique to use prn. Pt has current deficits in thoracolumbar AROM, decreased LLE strength, lumbar spine stiffness/hypomobility, posterior chain tightness/dec flexibility. Pt will continue to benefit from skilled PT services to address deficits and improve function.   OBJECTIVE IMPAIRMENTS: Abnormal gait, decreased mobility, decreased ROM, decreased strength, hypomobility, impaired flexibility, postural dysfunction, and pain.   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   REHAB POTENTIAL: Good  CLINICAL DECISION MAKING: Evolving/moderate complexity  EVALUATION COMPLEXITY: Moderate   GOALS: Goals reviewed with patient? Yes  SHORT TERM GOALS: Target date: 09/12/2023  Pt will be independent with HEP in order to improve strength and decrease back pain to improve pain-free function at home and work. Baseline: 08/21/23: Baseline HEP initiated.  Goal status: INITIAL   LONG TERM GOALS: Target date: 10/10/2023  Patient will have full thoracolumbar AROM without reproduction of pain as needed for reaching items on ground, household chores,  bending Baseline: 08/21/23: Motion loss and pain with flexion, motion loss with extension, bilateral lateral flexion, L rotation.  Goal status: INITIAL  2.  Pt will decrease worst back pain by at least 2 points on the NPRS in order to demonstrate clinically significant reduction in back pain. Baseline: 08/21/23: 9/10  Goal status: INITIAL  3.  Pt will decrease mODI score by at least 13 points in order demonstrate clinically significant reduction in back pain/disability.       Baseline: 08/21/23: 10/50 = 20% Goal status: INITIAL  4.  Pt will tolerate sitting for leisure activity and traveling up to 1 hour without reproduction of pain.  Baseline: 08/21/23: Pain with sitting > 1-2 hr.  Goal status: INITIAL   PLAN: PT FREQUENCY: 1-2x/week  PT DURATION: 6 weeks  PLANNED INTERVENTIONS: Therapeutic exercises, Therapeutic activity, Neuromuscular re-education, Balance training, Gait training, Patient/Family education, Self Care, Joint mobilization, Joint manipulation, Vestibular training, Canalith repositioning, Orthotic/Fit training, DME instructions, Dry Needling, Electrical stimulation, Spinal manipulation, Spinal mobilization, Cryotherapy, Moist heat, Taping, Traction, Ultrasound, Ionotophoresis 4mg /ml Dexamethasone, Manual therapy, and Re-evaluation.  PLAN FOR NEXT SESSION: Continue with thoracolumbar mobility. Manual techniques/DTM along R QL/iliocostalis lumborum.    Venetia Endo, PT, DPT #[id number]  Venetia ONEIDA Endo, PT 09/06/2023, 10:43 AM

## 2023-09-06 ENCOUNTER — Encounter: Payer: Self-pay | Admitting: Physical Therapy

## 2023-09-10 ENCOUNTER — Ambulatory Visit: Admitting: Physical Therapy

## 2023-09-10 DIAGNOSIS — M47816 Spondylosis without myelopathy or radiculopathy, lumbar region: Secondary | ICD-10-CM

## 2023-09-10 DIAGNOSIS — R262 Difficulty in walking, not elsewhere classified: Secondary | ICD-10-CM | POA: Diagnosis not present

## 2023-09-10 DIAGNOSIS — M5459 Other low back pain: Secondary | ICD-10-CM

## 2023-09-10 DIAGNOSIS — M6281 Muscle weakness (generalized): Secondary | ICD-10-CM

## 2023-09-10 NOTE — Therapy (Signed)
 OUTPATIENT PHYSICAL THERAPY TREATMENT   Patient Name: Tyler Glover MRN: [id number] DOB:November 28, 1960, 63 y.o., male Today's Date: 09/10/2023  END OF SESSION:  PT End of Session - 09/10/23 0904     Visit Number 6    Number of Visits 13    Date for PT Re-Evaluation 10/03/23    PT Start Time 0905    PT Stop Time 0945    PT Time Calculation (min) 40 min    Behavior During Therapy [facility] for tasks assessed/performed          Past Medical History:  Diagnosis Date   Allergy    Arthritis    Rheumatoid   GERD (gastroesophageal reflux disease)    Hypertension    Past Surgical History:  Procedure Laterality Date   COLONOSCOPY     COLONOSCOPY WITH PROPOFOL  N/A 08/21/2019   Procedure: COLONOSCOPY WITH PROPOFOL ;  Surgeon: Jinny Carmine, MD;  Location: [facility];  Service: Endoscopy;  Laterality: N/A;  priority 4   ESOPHAGOGASTRODUODENOSCOPY (EGD) WITH PROPOFOL  N/A 08/21/2019   Procedure: ESOPHAGOGASTRODUODENOSCOPY (EGD) WITH PROPOFOL ;  Surgeon: Jinny Carmine, MD;  Location: [facility];  Service: Endoscopy;  Laterality: N/A;   POLYPECTOMY  08/21/2019   Procedure: POLYPECTOMY;  Surgeon: Jinny Carmine, MD;  Location: [facility];  Service: Endoscopy;;   SHOULDER SURGERY     TESTICLE SURGERY     age 30 or 88   TONSILLECTOMY     Patient Active Problem List   Diagnosis Date Noted   Encounter for screening colonoscopy    Polyp of transverse colon    Heartburn    Pure hypercholesterolemia 01/26/2016   Right lateral epicondylitis 07/07/2015   Vitamin D  deficiency 12/24/2014   Prediabetes 12/24/2014   Allergic rhinitis, seasonal 06/03/2014   Acid reflux 06/03/2014   Essential (primary) hypertension 06/03/2014   Family history of diabetes mellitus 06/03/2014   Family history of cardiac disorder 06/03/2014   Obesity (BMI 30.0-34.9) 06/03/2014    PCP: Joshua Cathryne BROCKS, MD (Inactive)  REFERRING PROVIDER: Mayur Loree Blanch, MD, PharmD  REFERRING DIAG: [phone]  Lumbar spondylosis, M54.9 Back pain  RATIONALE FOR EVALUATION AND TREATMENT: Rehabilitation  THERAPY DIAG: Other low back pain  Lumbar spondylosis  Muscle weakness (generalized)  ONSET DATE: May 2025 (worsening symptoms, acute on chronic flare-up)  FOLLOW-UP APPT SCHEDULED WITH REFERRING PROVIDER: None currently in EMR  PERTINENT HISTORY: Pt is a 63 year old male with Parkinson's disease who ambulates at community-level without AD. Pt has new referral for lumbar spondylosis/LBP. Pt is well known to this clinic and has been attending PT with focus on gait/balance/strengthening.   Patient is s/p ESI at R L4-L5/L5-S1 neuroforamen. Pt reports acute flare-up of back pain in May of this year with worsening pain and R-sided symptoms primarily. Pt reports attempting to care for it on his own. He reports dealing with it most of this year prior to that. He feels that injections have helped. He state she still has pain along R side. Pt reports pain in R flank/R lower lumbar paraspinal region. Pt reports previous episode of L-sided sciatica - not present now. He reports no current paresthesias/numbness. Patient reports intermittent disturbed sleep due to low back.   Previous episode of care focused on gait, balance, and LLE weakness within context of Parkinson's disease. Pt met goals for outcome measures. He would still like to work on improving LLE strength and foot clearance.   PAIN:    Pain Intensity: Present: 4-6/10, Best: 0/10, Worst: 9/10 Pain location: R  lower lumbar paraspinal/R flank Pain Quality: sharp  Radiating: No  Numbness/Tingling: No Focal Weakness: Yes; weakness in LLE due to neurological deficits; no R-sided weakness per pt.  Aggravating factors: leaning over countertop/desktop, bending down to pick up items, prolonged sitting > 1-2 hours Relieving factors: Double knee to chest, heat, ice, Salonpas patch, Tylenol  24-hour pain behavior: worse late in the day  How long can you sit:  painful after 1-2 hours History of prior back injury, pain, surgery, or therapy: Yes; Hx of episodic back pain managed with chiropractic and medical management/injections  Dominant hand: right Imaging: Yes ;  CLINICAL DATA:  Low back pain progressively worsening. Right thigh numbness   EXAM: MRI LUMBAR SPINE WITHOUT CONTRAST   TECHNIQUE: Multiplanar, multisequence MR imaging of the lumbar spine was performed. No intravenous contrast was administered.   COMPARISON:  None Available.   FINDINGS: Segmentation:  Standard.   Alignment:  2 mm retrolisthesis of L2 on L3.   Vertebrae: No acute fracture, evidence of discitis, or aggressive bone lesion.   Conus medullaris and cauda equina: Conus extends to the T12-L1 level. Conus and cauda equina appear normal.   Paraspinal and other soft tissues: No acute paraspinal abnormality.   Disc levels:   Disc spaces: Degenerative disease with disc height loss at L5-S1. Mild disc height loss at L1-2. Disc desiccation at L1-2, L2-3 and L3-4.   T11-12: Mild broad-based disc bulge. No foraminal or central canal stenosis.   T12-L1: No significant disc bulge. No neural foraminal stenosis. No central canal stenosis. Mild bilateral facet arthropathy.   L1-L2: Mild broad-based disc bulge. Mild bilateral facet arthropathy. No foraminal or central canal stenosis.   L2-L3: Broad-based disc bulge. Mild bilateral facet arthropathy. Bilateral lateral recess narrowing. Minimal spinal stenosis. No foraminal stenosis.   L3-L4: Mild broad-based disc bulge. Mild bilateral facet arthropathy. No foraminal or central canal stenosis.   L4-L5: No significant disc bulge. No neural foraminal stenosis. No central canal stenosis. Mild bilateral facet arthropathy.   L5-S1: Broad-based disc osteophyte complex with a focal prominent right far lateral disc osteophyte complex. No foraminal or central canal stenosis.   IMPRESSION: 1. Lumbar spine spondylosis  as described above. 2. No acute osseous injury of the lumbar spine.   Red flags: Negative for bowel/bladder changes, saddle paresthesia, personal history of cancer, h/o spinal tumors, h/o compression fx, h/o abdominal aneurysm, abdominal pain, chills/fever, night sweats, nausea, vomiting, unrelenting pain, first onset of insidious LBP <20 y/o  PRECAUTIONS: None  WEIGHT BEARING RESTRICTIONS: No  FALLS: Has patient fallen in last 6 months? No  Living Environment Lives with: lives with their spouse and mother  Lives in: House/apartment; one step to get into home; stairs to get into home, pt has upstairs bedroom. Pt reports doing fairly well with stairs. 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   Hobbies: Visiting museums, sight-seeing, travel  Patient Goals: Reduced pain, improved movement     OBJECTIVE (data from initial evaluation unless otherwise dated):   Patient Surveys  ODI = 10/50 = 20%  GAIT: Distance walked: 40 ft Assistive device utilized: None Level of assistance: SBA Comments: Forward-flexed posture, dec step length  Posture: Moderate thoracic kyphosis and forward head, slouched posture c Hx of Parkinson's Lumbar lordosis: WNL Iliac crest height: Equal bilaterally Lumbar lateral shift:  Negative  AROM AROM (Normal range in degrees) AROM  08/21/23  Lumbar   Flexion (65) 75%*  Extension (30) 75%*  Right lateral flexion (25) 75%  Left lateral flexion (25) 75%  Right rotation (30) 100%  Left rotation (30) 50%      Hip Right Left  Flexion (125) WNL WNL  Extension (15)    Abduction (40)    Adduction     Internal Rotation (45)    External Rotation (45)        (* = pain; Blank rows = not tested)  LE MMT: MMT (out of 5) Right 08/21/23 Left 08/21/23  Hip  flexion 5 5  Hip extension    Hip abduction    Hip adduction    Hip internal rotation    Hip external rotation    Knee flexion 5 5  Knee extension 5 5-  Ankle dorsiflexion 4 4  Ankle plantarflexion    Ankle inversion    Ankle eversion    (* = pain; Blank rows = not tested)  Sensation Grossly intact to light touch throughout bilateral LEs as determined by testing dermatomes L2-S2. Proprioception, stereognosis, and hot/cold testing deferred on this date.  Reflexes R/L Knee Jerk (L3/4): 2+/2+  Ankle Jerk (S1/2): Unable to obtain  Clonus: Negative bilat   Muscle Length Hamstrings: R: Positive L: Positive E[place] (quadriceps): R: Positive L: Positive  Palpation Location Right Left         Lumbar paraspinals 2   Quadratus Lumborum 1   Gluteus Maximus 1   Gluteus Medius 0   Deep hip external rotators 0   PSIS 0   Fortin's Area (SIJ) 0   Greater Trochanter    (Blank rows = not tested) Graded on 0-4 scale (0 = no pain, 1 = pain, 2 = pain with wincing/grimacing/flinching, 3 = pain with withdrawal, 4 = unwilling to allow palpation)  Special Tests Lumbar Radiculopathy and Discogenic: Centralization and Peripheralization (SN 92, -LR 0.12): Not examined.  Slump (SN 83, -LR 0.32): R: Negative L: Negative SLR (SN 92, -LR 0.29): R: Negative L:  Negative   Facet Joint: Extension-Rotation (SN 100, -LR 0.0): R: Negative L: Negative  Lumbar Foraminal Stenosis: Lumbar quadrant (SN 70): R: Negative L: Negative  Hip: FABER (SN 81): R: Negative (pain with release of FABER on R;  L: Negative   Passive Accessory Intervertebral Motion Pain at restriction L3-5 CPA, moderate hypomobility of lower lumbar spine.     TODAY'S TREATMENT: DATE: 09/10/2023    SUBJECTIVE STATEMENT:   Patient reports doing better compared to last visit. 2-3/10 NPRS. Patient reports pain largely localized to R flank. Patient reports completing carrying/lifting to set up automotive shop for work - he reports  some soreness in his back after this.     Manual Therapy - for symptom modulation, soft tissue sensitivity and mobility, joint mobility, ROM   Hooklying lumbar traction, 10-sec intermittent holds; x 6 minutes, PT at foot of treatment table STM and IASTM with Hypervolt along R longissimus/iliocostalis lumborum and R gluteal musculature; x 15 minutes   Therapeutic Exercise - for improved soft tissue flexibility and extensibility as needed for ROM, improved strength as needed to improve performance of CKC activities/functional movements, MDT/repeated movement for symptom modulation   Lower trunk rotations, hooklying; 1 x 10 alt R/L  Open book; 1 x 10 R and L side  Bridge; 2 x 10  -progress volume next visit   PATIENT EDUCATION: Discussed expectations with repeated movement program, encouraged  continued HEP and activity modification.    *next visit* NuStep; Level 3, x 5 minutes - for improved soft tissue mobility and increased tissue temperature to improve muscle performance     *not today* Repeated extension in lying, 1 x 10: no effect during, better after  Lower trunk rotations; with QL bias; x 10 (LLE crossed to stretch R QL today), 3 sec hold   PATIENT EDUCATION:  Education details: see above for patient education details Person educated: Patient Education method: Explanation, Demonstration, and Handouts Education comprehension: verbalized understanding and returned demonstration   HOME EXERCISE PROGRAM:  Access Code: [email or web address] URL: [email or web address] Date: 08/27/2023 Prepared by: Venetia Endo  Exercises - Prone Press Up  - 5-6 x daily - 7 x weekly - 1 sets - 10 reps - 1sec hold - Supine Quadratus Lumborum Stretch  - 2 x daily - 7 x weekly - 2 sets - 10 reps - 2-3sec hold - Seated Hamstring Stretch  - 2 x daily - 7 x weekly - 3 sets - 30sec hold   ASSESSMENT:  CLINICAL IMPRESSION: Patient fortunately has better NPRS and subjective feels better  versus last visit after he experienced flare-up of R-sided low back pain following lifting/twisting the day prior. He has ongoing sensitivity affecting R iliocostalis lumborum and QL without notable referral pattern. Pt tolerates modest increase in volume with supine exercises well. Pt has current deficits in thoracolumbar AROM, decreased LLE strength, lumbar spine stiffness/hypomobility, posterior chain tightness/dec flexibility. Pt will continue to benefit from skilled PT services to address deficits and improve function.   OBJECTIVE IMPAIRMENTS: Abnormal gait, decreased mobility, decreased ROM, decreased strength, hypomobility, impaired flexibility, postural dysfunction, and pain.   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   REHAB POTENTIAL: Good  CLINICAL DECISION MAKING: Evolving/moderate complexity  EVALUATION COMPLEXITY: Moderate   GOALS: Goals reviewed with patient? Yes  SHORT TERM GOALS: Target date: 09/12/2023  Pt will be independent with HEP in order to improve strength and decrease back pain to improve pain-free function at home and work. Baseline: 08/21/23: Baseline HEP initiated.  Goal status: INITIAL   LONG TERM GOALS: Target date: 10/10/2023  Patient will have full thoracolumbar AROM without reproduction of pain as needed for reaching items on ground, household chores, bending Baseline: 08/21/23: Motion loss and pain with flexion, motion loss with extension, bilateral lateral flexion, L rotation.  Goal status: INITIAL  2.  Pt will decrease worst back pain by at least 2 points on the NPRS in  order to demonstrate clinically significant reduction in back pain. Baseline: 08/21/23: 9/10  Goal status: INITIAL  3.  Pt will decrease mODI score by at least 13 points in order demonstrate clinically significant reduction in back pain/disability.       Baseline: 08/21/23: 10/50 = 20% Goal status: INITIAL  4.  Pt will tolerate sitting for leisure activity and traveling up to 1 hour without reproduction of pain.  Baseline: 08/21/23: Pain with sitting > 1-2 hr.  Goal status: INITIAL   PLAN: PT FREQUENCY: 1-2x/week  PT DURATION: 6 weeks  PLANNED INTERVENTIONS: Therapeutic exercises, Therapeutic activity, Neuromuscular re-education, Balance training, Gait training, Patient/Family education, Self Care, Joint mobilization, Joint manipulation, Vestibular training, Canalith repositioning, Orthotic/Fit training, DME instructions, Dry Needling, Electrical stimulation,  Spinal manipulation, Spinal mobilization, Cryotherapy, Moist heat, Taping, Traction, Ultrasound, Ionotophoresis 4mg /ml Dexamethasone, Manual therapy, and Re-evaluation.  PLAN FOR NEXT SESSION: Continue with thoracolumbar mobility. Manual techniques/DTM along R QL/iliocostalis lumborum.    Venetia Endo, PT, DPT #[id number]  Venetia ONEIDA Endo, PT 09/10/2023, 9:04 AM

## 2023-09-12 ENCOUNTER — Ambulatory Visit: Admitting: Physical Therapy

## 2023-09-12 ENCOUNTER — Encounter: Payer: Self-pay | Admitting: Physical Therapy

## 2023-09-12 DIAGNOSIS — M5459 Other low back pain: Secondary | ICD-10-CM

## 2023-09-12 DIAGNOSIS — M47816 Spondylosis without myelopathy or radiculopathy, lumbar region: Secondary | ICD-10-CM

## 2023-09-12 DIAGNOSIS — M6281 Muscle weakness (generalized): Secondary | ICD-10-CM

## 2023-09-12 DIAGNOSIS — R262 Difficulty in walking, not elsewhere classified: Secondary | ICD-10-CM | POA: Diagnosis not present

## 2023-09-12 NOTE — Therapy (Signed)
 OUTPATIENT PHYSICAL THERAPY TREATMENT   Patient Name: Tyler Glover MRN: [id number] DOB:April 08, 1960, 63 y.o., male Today's Date: 09/12/2023  END OF SESSION:  PT End of Session - 09/12/23 0913     Visit Number 7    Number of Visits 13    Date for PT Re-Evaluation 10/03/23    PT Start Time 0908    PT Stop Time 0946    PT Time Calculation (min) 38 min    Behavior During Therapy [facility] for tasks assessed/performed           Past Medical History:  Diagnosis Date   Allergy    Arthritis    Rheumatoid   GERD (gastroesophageal reflux disease)    Hypertension    Past Surgical History:  Procedure Laterality Date   COLONOSCOPY     COLONOSCOPY WITH PROPOFOL  N/A 08/21/2019   Procedure: COLONOSCOPY WITH PROPOFOL ;  Surgeon: Tyler Carmine, MD;  Location: [facility];  Service: Endoscopy;  Laterality: N/A;  priority 4   ESOPHAGOGASTRODUODENOSCOPY (EGD) WITH PROPOFOL  N/A 08/21/2019   Procedure: ESOPHAGOGASTRODUODENOSCOPY (EGD) WITH PROPOFOL ;  Surgeon: Tyler Carmine, MD;  Location: [facility];  Service: Endoscopy;  Laterality: N/A;   POLYPECTOMY  08/21/2019   Procedure: POLYPECTOMY;  Surgeon: Tyler Carmine, MD;  Location: [facility];  Service: Endoscopy;;   SHOULDER SURGERY     TESTICLE SURGERY     age 51 or 38   TONSILLECTOMY     Patient Active Problem List   Diagnosis Date Noted   Encounter for screening colonoscopy    Polyp of transverse colon    Heartburn    Pure hypercholesterolemia 01/26/2016   Right lateral epicondylitis 07/07/2015   Vitamin D  deficiency 12/24/2014   Prediabetes 12/24/2014   Allergic rhinitis, seasonal 06/03/2014   Acid reflux 06/03/2014   Essential (primary) hypertension 06/03/2014   Family history of diabetes mellitus 06/03/2014   Family history of cardiac disorder 06/03/2014   Obesity (BMI 30.0-34.9) 06/03/2014    PCP: Joshua Cathryne BROCKS, MD (Inactive)  REFERRING PROVIDER: Mayur Loree Blanch, MD, PharmD  REFERRING DIAG: [phone]  Lumbar spondylosis, M54.9 Back pain  RATIONALE FOR EVALUATION AND TREATMENT: Rehabilitation  THERAPY DIAG: Other low back pain  Lumbar spondylosis  Muscle weakness (generalized)  ONSET DATE: May 2025 (worsening symptoms, acute on chronic flare-up)  FOLLOW-UP APPT SCHEDULED WITH REFERRING PROVIDER: None currently in EMR  PERTINENT HISTORY: Pt is a 63 year old male with Parkinson's disease who ambulates at community-level without AD. Pt has new referral for lumbar spondylosis/LBP. Pt is well known to this clinic and has been attending PT with focus on gait/balance/strengthening.   Patient is s/p ESI at R L4-L5/L5-S1 neuroforamen. Pt reports acute flare-up of back pain in May of this year with worsening pain and R-sided symptoms primarily. Pt reports attempting to care for it on his own. He reports dealing with it most of this year prior to that. He feels that injections have helped. He state she still has pain along R side. Pt reports pain in R flank/R lower lumbar paraspinal region. Pt reports previous episode of L-sided sciatica - not present now. He reports no current paresthesias/numbness. Patient reports intermittent disturbed sleep due to low back.   Previous episode of care focused on gait, balance, and LLE weakness within context of Parkinson's disease. Pt met goals for outcome measures. He would still like to work on improving LLE strength and foot clearance.   PAIN:    Pain Intensity: Present: 4-6/10, Best: 0/10, Worst: 9/10 Pain location:  R lower lumbar paraspinal/R flank Pain Quality: sharp  Radiating: No  Numbness/Tingling: No Focal Weakness: Yes; weakness in LLE due to neurological deficits; no R-sided weakness per pt.  Aggravating factors: leaning over countertop/desktop, bending down to pick up items, prolonged sitting > 1-2 hours Relieving factors: Double knee to chest, heat, ice, Salonpas patch, Tylenol  24-hour pain behavior: worse late in the day  How long can you sit:  painful after 1-2 hours History of prior back injury, pain, surgery, or therapy: Yes; Hx of episodic back pain managed with chiropractic and medical management/injections  Dominant hand: right Imaging: Yes ;  CLINICAL DATA:  Low back pain progressively worsening. Right thigh numbness   EXAM: MRI LUMBAR SPINE WITHOUT CONTRAST   TECHNIQUE: Multiplanar, multisequence MR imaging of the lumbar spine was performed. No intravenous contrast was administered.   COMPARISON:  None Available.   FINDINGS: Segmentation:  Standard.   Alignment:  2 mm retrolisthesis of L2 on L3.   Vertebrae: No acute fracture, evidence of discitis, or aggressive bone lesion.   Conus medullaris and cauda equina: Conus extends to the T12-L1 level. Conus and cauda equina appear normal.   Paraspinal and other soft tissues: No acute paraspinal abnormality.   Disc levels:   Disc spaces: Degenerative disease with disc height loss at L5-S1. Mild disc height loss at L1-2. Disc desiccation at L1-2, L2-3 and L3-4.   T11-12: Mild broad-based disc bulge. No foraminal or central canal stenosis.   T12-L1: No significant disc bulge. No neural foraminal stenosis. No central canal stenosis. Mild bilateral facet arthropathy.   L1-L2: Mild broad-based disc bulge. Mild bilateral facet arthropathy. No foraminal or central canal stenosis.   L2-L3: Broad-based disc bulge. Mild bilateral facet arthropathy. Bilateral lateral recess narrowing. Minimal spinal stenosis. No foraminal stenosis.   L3-L4: Mild broad-based disc bulge. Mild bilateral facet arthropathy. No foraminal or central canal stenosis.   L4-L5: No significant disc bulge. No neural foraminal stenosis. No central canal stenosis. Mild bilateral facet arthropathy.   L5-S1: Broad-based disc osteophyte complex with a focal prominent right far lateral disc osteophyte complex. No foraminal or central canal stenosis.   IMPRESSION: 1. Lumbar spine spondylosis  as described above. 2. No acute osseous injury of the lumbar spine.   Red flags: Negative for bowel/bladder changes, saddle paresthesia, personal history of cancer, h/o spinal tumors, h/o compression fx, h/o abdominal aneurysm, abdominal pain, chills/fever, night sweats, nausea, vomiting, unrelenting pain, first onset of insidious LBP <20 y/o  PRECAUTIONS: None  WEIGHT BEARING RESTRICTIONS: No  FALLS: Has patient fallen in last 6 months? No  Living Environment Lives with: lives with their spouse and mother  Lives in: House/apartment; one step to get into home; stairs to get into home, pt has upstairs bedroom. Pt reports doing fairly well with stairs. 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   Hobbies: Visiting museums, sight-seeing, travel  Patient Goals: Reduced pain, improved movement     OBJECTIVE (data from initial evaluation unless otherwise dated):   Patient Surveys  ODI = 10/50 = 20%  GAIT: Distance walked: 40 ft Assistive device utilized: None Level of assistance: SBA Comments: Forward-flexed posture, dec step length  Posture: Moderate thoracic kyphosis and forward head, slouched posture c Hx of Parkinson's Lumbar lordosis: WNL Iliac crest height: Equal bilaterally Lumbar lateral  shift: Negative  AROM AROM (Normal range in degrees) AROM  08/21/23  Lumbar   Flexion (65) 75%*  Extension (30) 75%*  Right lateral flexion (25) 75%  Left lateral flexion (25) 75%  Right rotation (30) 100%  Left rotation (30) 50%      Hip Right Left  Flexion (125) WNL WNL  Extension (15)    Abduction (40)    Adduction     Internal Rotation (45)    External Rotation (45)        (* = pain; Blank rows = not tested)  LE MMT: MMT (out of 5) Right 08/21/23 Left 08/21/23  Hip  flexion 5 5  Hip extension    Hip abduction    Hip adduction    Hip internal rotation    Hip external rotation    Knee flexion 5 5  Knee extension 5 5-  Ankle dorsiflexion 4 4  Ankle plantarflexion    Ankle inversion    Ankle eversion    (* = pain; Blank rows = not tested)  Sensation Grossly intact to light touch throughout bilateral LEs as determined by testing dermatomes L2-S2. Proprioception, stereognosis, and hot/cold testing deferred on this date.  Reflexes R/L Knee Jerk (L3/4): 2+/2+  Ankle Jerk (S1/2): Unable to obtain  Clonus: Negative bilat   Muscle Length Hamstrings: R: Positive L: Positive [place] (quadriceps): R: Positive L: Positive  Palpation Location Right Left         Lumbar paraspinals 2   Quadratus Lumborum 1   Gluteus Maximus 1   Gluteus Medius 0   Deep hip external rotators 0   PSIS 0   Fortin's Area (SIJ) 0   Greater Trochanter    (Blank rows = not tested) Graded on 0-4 scale (0 = no pain, 1 = pain, 2 = pain with wincing/grimacing/flinching, 3 = pain with withdrawal, 4 = unwilling to allow palpation)  Special Tests Lumbar Radiculopathy and Discogenic: Centralization and Peripheralization (SN 92, -LR 0.12): Not examined.  Slump (SN 83, -LR 0.32): R: Negative L: Negative SLR (SN 92, -LR 0.29): R: Negative L:  Negative   Facet Joint: Extension-Rotation (SN 100, -LR 0.0): R: Negative L: Negative  Lumbar Foraminal Stenosis: Lumbar quadrant (SN 70): R: Negative L: Negative  Hip: FABER (SN 81): R: Negative (pain with release of FABER on R;  L: Negative   Passive Accessory Intervertebral Motion Pain at restriction L3-5 CPA, moderate hypomobility of lower lumbar spine.     TODAY'S TREATMENT: DATE: 09/12/2023    SUBJECTIVE STATEMENT:   Patient reports doing better compared to last visit. 2-3/10 NPRS. Patient reports pain largely localized to R flank. Patient reports intermittent disturbed sleep due to pain in R hip that was fleeting. He reports  largely improved intensity of symptoms. He denies notable referred or leg symptoms at arrival.     Manual Therapy - for symptom modulation, soft tissue sensitivity and mobility, joint mobility, ROM   STM and IASTM with Hypervolt along R longissimus/iliocostalis lumborum and R gluteal musculature; x 15 minutes General manual lumbar traction in hooklying with Mulligan belt; therapist at foot of patient; 10 sec on, 10 sec off x 5 minutes for nerve root decompression, pain control   Therapeutic Exercise - for improved soft tissue flexibility and extensibility as needed for ROM, improved strength as needed to improve performance of CKC activities/functional movements, MDT/repeated movement for symptom modulation  NuStep; Level 3, x 6 minutes - for improved soft tissue mobility and increased tissue temperature to  improve muscle performance   Lower trunk rotations, hooklying; 1 x 15 alt R/L  Supine piriformis stretch; 2 x 30 sec   Bridge; 2 x 10   3 way kick, standing; x 8 ea dir, bilat  PATIENT EDUCATION: Discussed expectations with repeated movement program, encouraged continued HEP and activity modification.     *not today* Hooklying lumbar traction, 10-sec intermittent holds; x 6 minutes, PT at foot of treatment table Open book; 1 x 10 R and L side Repeated extension in lying, 1 x 10: no effect during, better after  Lower trunk rotations; with QL bias; x 10 (LLE crossed to stretch R QL today), 3 sec hold   PATIENT EDUCATION:  Education details: see above for patient education details Person educated: Patient Education method: Explanation, Demonstration, and Handouts Education comprehension: verbalized understanding and returned demonstration   HOME EXERCISE PROGRAM:  Access Code: [email or web address] URL: [email or web address] Date: 09/12/2023 Prepared by: Venetia Endo  Exercises - Prone Press Up  - 5-6 x daily - 7 x weekly - 1 sets - 10 reps - 1sec hold - Supine  Quadratus Lumborum Stretch  - 2 x daily - 7 x weekly - 2 sets - 10 reps - 2-3sec hold - Seated Hamstring Stretch  - 2 x daily - 7 x weekly - 3 sets - 30sec hold - Supine Piriformis Stretch with Foot on Ground  - 2 x daily - 7 x weekly - 3 sets - 30sec hold   ASSESSMENT:  CLINICAL IMPRESSION: Patient has made good progress to date in spite of chronicity of symptoms and flare-up over this previous week. Pt is able to modestly progress with gluteal strengthening today without notable increase in pain. Pt has good response to manual intervention and reports feeling better at end of session. Pt has current deficits in thoracolumbar AROM, decreased LLE strength, lumbar spine stiffness/hypomobility, posterior chain tightness/dec flexibility. Pt will continue to benefit from skilled PT services to address deficits and improve function.   OBJECTIVE IMPAIRMENTS: Abnormal gait, decreased mobility, decreased ROM, decreased strength, hypomobility, impaired flexibility, postural dysfunction, and pain.   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   REHAB POTENTIAL: Good  CLINICAL DECISION MAKING: Evolving/moderate complexity  EVALUATION COMPLEXITY: Moderate   GOALS: Goals reviewed with patient? Yes  SHORT TERM GOALS: Target date: 09/12/2023  Pt will be independent with HEP in order to improve strength and decrease back pain to improve pain-free function at home and work. Baseline: 08/21/23: Baseline HEP initiated.  Goal status: INITIAL   LONG TERM GOALS: Target date: 10/10/2023  Patient will have full thoracolumbar  AROM without reproduction of pain as needed for reaching items on ground, household chores, bending Baseline: 08/21/23: Motion loss and pain with flexion, motion loss with extension, bilateral lateral flexion, L rotation.  Goal status: INITIAL  2.  Pt will decrease worst back pain by at least 2 points on the NPRS in order to demonstrate clinically significant reduction in back pain. Baseline: 08/21/23: 9/10  Goal status: INITIAL  3.  Pt will decrease mODI score by at least 13 points in order demonstrate clinically significant reduction in back pain/disability.       Baseline: 08/21/23: 10/50 = 20% Goal status: INITIAL  4.  Pt will tolerate sitting  for leisure activity and traveling up to 1 hour without reproduction of pain.  Baseline: 08/21/23: Pain with sitting > 1-2 hr.  Goal status: INITIAL   PLAN: PT FREQUENCY: 1-2x/week  PT DURATION: 6 weeks  PLANNED INTERVENTIONS: Therapeutic exercises, Therapeutic activity, Neuromuscular re-education, Balance training, Gait training, Patient/Family education, Self Care, Joint mobilization, Joint manipulation, Vestibular training, Canalith repositioning, Orthotic/Fit training, DME instructions, Dry Needling, Electrical stimulation, Spinal manipulation, Spinal mobilization, Cryotherapy, Moist heat, Taping, Traction, Ultrasound, Ionotophoresis 4mg /ml Dexamethasone, Manual therapy, and Re-evaluation.  PLAN FOR NEXT SESSION: Continue with thoracolumbar mobility. Manual techniques/DTM along R QL/iliocostalis lumborum.    Venetia Endo, PT, DPT #[id number]  Venetia ONEIDA Endo, PT 09/12/2023, 10:15 AM

## 2023-09-17 ENCOUNTER — Ambulatory Visit: Attending: Rheumatology | Admitting: Physical Therapy

## 2023-09-17 ENCOUNTER — Encounter: Payer: Self-pay | Admitting: Physical Therapy

## 2023-09-17 DIAGNOSIS — M47816 Spondylosis without myelopathy or radiculopathy, lumbar region: Secondary | ICD-10-CM | POA: Diagnosis present

## 2023-09-17 DIAGNOSIS — M6281 Muscle weakness (generalized): Secondary | ICD-10-CM | POA: Diagnosis present

## 2023-09-17 DIAGNOSIS — M5459 Other low back pain: Secondary | ICD-10-CM | POA: Insufficient documentation

## 2023-09-17 NOTE — Therapy (Signed)
 OUTPATIENT PHYSICAL THERAPY TREATMENT   Patient Name: Tyler Glover MRN: [id number] DOB:04-Oct-1960, 63 y.o., male Today's Date: 09/17/2023  END OF SESSION:  PT End of Session - 09/17/23 0740     Visit Number 8    Number of Visits 13    Date for PT Re-Evaluation 10/03/23    PT Start Time 0734    PT Stop Time 0814    PT Time Calculation (min) 40 min    Behavior During Therapy [facility] for tasks assessed/performed           Past Medical History:  Diagnosis Date   Allergy    Arthritis    Rheumatoid   GERD (gastroesophageal reflux disease)    Hypertension    Past Surgical History:  Procedure Laterality Date   COLONOSCOPY     COLONOSCOPY WITH PROPOFOL  N/A 08/21/2019   Procedure: COLONOSCOPY WITH PROPOFOL ;  Surgeon: Jinny Carmine, MD;  Location: [facility];  Service: Endoscopy;  Laterality: N/A;  priority 4   ESOPHAGOGASTRODUODENOSCOPY (EGD) WITH PROPOFOL  N/A 08/21/2019   Procedure: ESOPHAGOGASTRODUODENOSCOPY (EGD) WITH PROPOFOL ;  Surgeon: Jinny Carmine, MD;  Location: [facility];  Service: Endoscopy;  Laterality: N/A;   POLYPECTOMY  08/21/2019   Procedure: POLYPECTOMY;  Surgeon: Jinny Carmine, MD;  Location: [facility];  Service: Endoscopy;;   SHOULDER SURGERY     TESTICLE SURGERY     age 61 or 30   TONSILLECTOMY     Patient Active Problem List   Diagnosis Date Noted   Encounter for screening colonoscopy    Polyp of transverse colon    Heartburn    Pure hypercholesterolemia 01/26/2016   Right lateral epicondylitis 07/07/2015   Vitamin D  deficiency 12/24/2014   Prediabetes 12/24/2014   Allergic rhinitis, seasonal 06/03/2014   Acid reflux 06/03/2014   Essential (primary) hypertension 06/03/2014   Family history of diabetes mellitus 06/03/2014   Family history of cardiac disorder 06/03/2014   Obesity (BMI 30.0-34.9) 06/03/2014    PCP: Joshua Cathryne BROCKS, MD (Inactive)  REFERRING PROVIDER: Mayur Loree Blanch, MD, PharmD  REFERRING DIAG: [phone]  Lumbar spondylosis, M54.9 Back pain  RATIONALE FOR EVALUATION AND TREATMENT: Rehabilitation  THERAPY DIAG: Other low back pain  Lumbar spondylosis  Muscle weakness (generalized)  ONSET DATE: May 2025 (worsening symptoms, acute on chronic flare-up)  FOLLOW-UP APPT SCHEDULED WITH REFERRING PROVIDER: None currently in EMR  PERTINENT HISTORY: Pt is a 63 year old male with Parkinson's disease who ambulates at community-level without AD. Pt has new referral for lumbar spondylosis/LBP. Pt is well known to this clinic and has been attending PT with focus on gait/balance/strengthening.   Patient is s/p ESI at R L4-L5/L5-S1 neuroforamen. Pt reports acute flare-up of back pain in May of this year with worsening pain and R-sided symptoms primarily. Pt reports attempting to care for it on his own. He reports dealing with it most of this year prior to that. He feels that injections have helped. He state she still has pain along R side. Pt reports pain in R flank/R lower lumbar paraspinal region. Pt reports previous episode of L-sided sciatica - not present now. He reports no current paresthesias/numbness. Patient reports intermittent disturbed sleep due to low back.   Previous episode of care focused on gait, balance, and LLE weakness within context of Parkinson's disease. Pt met goals for outcome measures. He would still like to work on improving LLE strength and foot clearance.   PAIN:    Pain Intensity: Present: 4-6/10, Best: 0/10, Worst: 9/10 Pain location:  R lower lumbar paraspinal/R flank Pain Quality: sharp  Radiating: No  Numbness/Tingling: No Focal Weakness: Yes; weakness in LLE due to neurological deficits; no R-sided weakness per pt.  Aggravating factors: leaning over countertop/desktop, bending down to pick up items, prolonged sitting > 1-2 hours Relieving factors: Double knee to chest, heat, ice, Salonpas patch, Tylenol  24-hour pain behavior: worse late in the day  How long can you sit:  painful after 1-2 hours History of prior back injury, pain, surgery, or therapy: Yes; Hx of episodic back pain managed with chiropractic and medical management/injections  Dominant hand: right Imaging: Yes ;  CLINICAL DATA:  Low back pain progressively worsening. Right thigh numbness   EXAM: MRI LUMBAR SPINE WITHOUT CONTRAST   TECHNIQUE: Multiplanar, multisequence MR imaging of the lumbar spine was performed. No intravenous contrast was administered.   COMPARISON:  None Available.   FINDINGS: Segmentation:  Standard.   Alignment:  2 mm retrolisthesis of L2 on L3.   Vertebrae: No acute fracture, evidence of discitis, or aggressive bone lesion.   Conus medullaris and cauda equina: Conus extends to the T12-L1 level. Conus and cauda equina appear normal.   Paraspinal and other soft tissues: No acute paraspinal abnormality.   Disc levels:   Disc spaces: Degenerative disease with disc height loss at L5-S1. Mild disc height loss at L1-2. Disc desiccation at L1-2, L2-3 and L3-4.   T11-12: Mild broad-based disc bulge. No foraminal or central canal stenosis.   T12-L1: No significant disc bulge. No neural foraminal stenosis. No central canal stenosis. Mild bilateral facet arthropathy.   L1-L2: Mild broad-based disc bulge. Mild bilateral facet arthropathy. No foraminal or central canal stenosis.   L2-L3: Broad-based disc bulge. Mild bilateral facet arthropathy. Bilateral lateral recess narrowing. Minimal spinal stenosis. No foraminal stenosis.   L3-L4: Mild broad-based disc bulge. Mild bilateral facet arthropathy. No foraminal or central canal stenosis.   L4-L5: No significant disc bulge. No neural foraminal stenosis. No central canal stenosis. Mild bilateral facet arthropathy.   L5-S1: Broad-based disc osteophyte complex with a focal prominent right far lateral disc osteophyte complex. No foraminal or central canal stenosis.   IMPRESSION: 1. Lumbar spine spondylosis  as described above. 2. No acute osseous injury of the lumbar spine.   Red flags: Negative for bowel/bladder changes, saddle paresthesia, personal history of cancer, h/o spinal tumors, h/o compression fx, h/o abdominal aneurysm, abdominal pain, chills/fever, night sweats, nausea, vomiting, unrelenting pain, first onset of insidious LBP <20 y/o  PRECAUTIONS: None  WEIGHT BEARING RESTRICTIONS: No  FALLS: Has patient fallen in last 6 months? No  Living Environment Lives with: lives with their spouse and mother  Lives in: House/apartment; one step to get into home; stairs to get into home, pt has upstairs bedroom. Pt reports doing fairly well with stairs. 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   Hobbies: Visiting museums, sight-seeing, travel  Patient Goals: Reduced pain, improved movement     OBJECTIVE (data from initial evaluation unless otherwise dated):   Patient Surveys  ODI = 10/50 = 20%  GAIT: Distance walked: 40 ft Assistive device utilized: None Level of assistance: SBA Comments: Forward-flexed posture, dec step length  Posture: Moderate thoracic kyphosis and forward head, slouched posture c Hx of Parkinson's Lumbar lordosis: WNL Iliac crest height: Equal bilaterally Lumbar lateral  shift: Negative  AROM AROM (Normal range in degrees) AROM  08/21/23  Lumbar   Flexion (65) 75%*  Extension (30) 75%*  Right lateral flexion (25) 75%  Left lateral flexion (25) 75%  Right rotation (30) 100%  Left rotation (30) 50%      Hip Right Left  Flexion (125) WNL WNL  Extension (15)    Abduction (40)    Adduction     Internal Rotation (45)    External Rotation (45)        (* = pain; Blank rows = not tested)  LE MMT: MMT (out of 5) Right 08/21/23 Left 08/21/23  Hip  flexion 5 5  Hip extension    Hip abduction    Hip adduction    Hip internal rotation    Hip external rotation    Knee flexion 5 5  Knee extension 5 5-  Ankle dorsiflexion 4 4  Ankle plantarflexion    Ankle inversion    Ankle eversion    (* = pain; Blank rows = not tested)  Sensation Grossly intact to light touch throughout bilateral LEs as determined by testing dermatomes L2-S2. Proprioception, stereognosis, and hot/cold testing deferred on this date.  Reflexes R/L Knee Jerk (L3/4): 2+/2+  Ankle Jerk (S1/2): Unable to obtain  Clonus: Negative bilat   Muscle Length Hamstrings: R: Positive L: Positive [place] (quadriceps): R: Positive L: Positive  Palpation Location Right Left         Lumbar paraspinals 2   Quadratus Lumborum 1   Gluteus Maximus 1   Gluteus Medius 0   Deep hip external rotators 0   PSIS 0   Fortin's Area (SIJ) 0   Greater Trochanter    (Blank rows = not tested) Graded on 0-4 scale (0 = no pain, 1 = pain, 2 = pain with wincing/grimacing/flinching, 3 = pain with withdrawal, 4 = unwilling to allow palpation)  Special Tests Lumbar Radiculopathy and Discogenic: Centralization and Peripheralization (SN 92, -LR 0.12): Not examined.  Slump (SN 83, -LR 0.32): R: Negative L: Negative SLR (SN 92, -LR 0.29): R: Negative L:  Negative   Facet Joint: Extension-Rotation (SN 100, -LR 0.0): R: Negative L: Negative  Lumbar Foraminal Stenosis: Lumbar quadrant (SN 70): R: Negative L: Negative  Hip: FABER (SN 81): R: Negative (pain with release of FABER on R;  L: Negative   Passive Accessory Intervertebral Motion Pain at restriction L3-5 CPA, moderate hypomobility of lower lumbar spine.     TODAY'S TREATMENT: DATE: 09/17/2023    SUBJECTIVE STATEMENT:   Patient reports waking up around 3-4 AM due to pain. Patient reports 2/10 pain at arrival. 2-4/10 NPRS throughout the day. Patient reports working on hip stretching discussed last visit.     Manual Therapy -  for symptom modulation, soft tissue sensitivity and mobility, joint mobility, ROM   STM and IASTM with Hypervolt along R longissimus/iliocostalis lumborum and R gluteal musculature; x 15 minutes  CPA and R UPA; 2 x 30 sec bouts, gr II for pain modulation; L3-L5  *not today* General manual lumbar traction in hooklying with Mulligan belt; therapist at foot of patient; 10 sec on, 10 sec off x 5 minutes for nerve root decompression, pain control   Therapeutic Exercise - for improved soft tissue flexibility and extensibility as needed for ROM, improved strength as needed to improve performance of CKC activities/functional movements, MDT/repeated movement for symptom modulation  NuStep; Level 3, x 6 minutes - for improved soft tissue mobility and increased tissue temperature  to improve muscle performance   Lower trunk rotations; with QL bias; x 10 (LLE crossed to stretch R QL today), 3 sec hold  Supine piriformis stretch; 2 x 30 sec   Bridge; 2 x 10   3 way kick, standing; x 10 ea dir, bilat  -Red Tband around distal shins   PATIENT EDUCATION: Discussed current progress, POC. We reviewed positioning strategies to mitigate pain in early hours of morning.     *not today* Lower trunk rotations, hooklying; 1 x 15 alt R/L Hooklying lumbar traction, 10-sec intermittent holds; x 6 minutes, PT at foot of treatment table Open book; 1 x 10 R and L side Repeated extension in lying, 1 x 10: no effect during, better after    PATIENT EDUCATION:  Education details: see above for patient education details Person educated: Patient Education method: Explanation, Demonstration, and Handouts Education comprehension: verbalized understanding and returned demonstration   HOME EXERCISE PROGRAM:  Access Code: [email or web address] URL: [email or web address] Date: 09/12/2023 Prepared by: Venetia Endo  Exercises - Prone Press Up  - 5-6 x daily - 7 x weekly - 1 sets - 10 reps - 1sec hold - Supine  Quadratus Lumborum Stretch  - 2 x daily - 7 x weekly - 2 sets - 10 reps - 2-3sec hold - Seated Hamstring Stretch  - 2 x daily - 7 x weekly - 3 sets - 30sec hold - Supine Piriformis Stretch with Foot on Ground  - 2 x daily - 7 x weekly - 3 sets - 30sec hold   ASSESSMENT:  CLINICAL IMPRESSION: Patient fortunately has relatively low NPRS, but he has experienced regular disturbed sleep in early hours in the morning, 1-2 hours before his normal waking time. We reviewed positional strategies to improve comfort during these times. Patient has made good progress to date and gives positive subjective appraisal of benefit of PT; however, he does have remaining R-sided pain and sleep disturbance necessitating further intervention. Pt has current deficits in thoracolumbar AROM, decreased LLE strength, lumbar spine stiffness/hypomobility, posterior chain tightness/dec flexibility. Pt will continue to benefit from skilled PT services to address deficits and improve function.   OBJECTIVE IMPAIRMENTS: Abnormal gait, decreased mobility, decreased ROM, decreased strength, hypomobility, impaired flexibility, postural dysfunction, and pain.   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   REHAB POTENTIAL: Good  CLINICAL DECISION MAKING: Evolving/moderate complexity  EVALUATION COMPLEXITY: Moderate   GOALS: Goals reviewed with patient? Yes  SHORT TERM GOALS: Target date: 09/12/2023  Pt will be independent with HEP in order to improve strength and decrease back pain to improve pain-free function at home and work. Baseline:  08/21/23: Baseline HEP initiated.  Goal status: INITIAL   LONG TERM GOALS: Target date: 10/10/2023  Patient will have full thoracolumbar AROM without reproduction of pain as needed for reaching items on ground, household chores, bending Baseline: 08/21/23: Motion loss and pain with flexion, motion loss with extension, bilateral lateral flexion, L rotation.  Goal status: INITIAL  2.  Pt will decrease worst back pain by at least 2 points on the NPRS in order to demonstrate clinically significant reduction in back pain. Baseline: 08/21/23: 9/10  Goal status: INITIAL  3.  Pt will decrease mODI score by at least 13 points in order  demonstrate clinically significant reduction in back pain/disability.       Baseline: 08/21/23: 10/50 = 20% Goal status: INITIAL  4.  Pt will tolerate sitting for leisure activity and traveling up to 1 hour without reproduction of pain.  Baseline: 08/21/23: Pain with sitting > 1-2 hr.  Goal status: INITIAL   PLAN: PT FREQUENCY: 1-2x/week  PT DURATION: 6 weeks  PLANNED INTERVENTIONS: Therapeutic exercises, Therapeutic activity, Neuromuscular re-education, Balance training, Gait training, Patient/Family education, Self Care, Joint mobilization, Joint manipulation, Vestibular training, Canalith repositioning, Orthotic/Fit training, DME instructions, Dry Needling, Electrical stimulation, Spinal manipulation, Spinal mobilization, Cryotherapy, Moist heat, Taping, Traction, Ultrasound, Ionotophoresis 4mg /ml Dexamethasone, Manual therapy, and Re-evaluation.  PLAN FOR NEXT SESSION: Continue with thoracolumbar mobility. Manual techniques/DTM along R QL/iliocostalis lumborum.    Venetia Endo, PT, DPT #[id number]  Venetia ONEIDA Endo, PT 09/17/2023, 8:13 AM

## 2023-09-19 ENCOUNTER — Ambulatory Visit: Admitting: Physical Therapy

## 2023-09-19 DIAGNOSIS — M6281 Muscle weakness (generalized): Secondary | ICD-10-CM

## 2023-09-19 DIAGNOSIS — M5459 Other low back pain: Secondary | ICD-10-CM

## 2023-09-19 DIAGNOSIS — M47816 Spondylosis without myelopathy or radiculopathy, lumbar region: Secondary | ICD-10-CM

## 2023-09-19 NOTE — Therapy (Signed)
 OUTPATIENT PHYSICAL THERAPY TREATMENT   Patient Name: Tyler Glover MRN: [id number] DOB:[birth year], 63 y.o., male Today's Date: 09/19/2023  END OF SESSION:  PT End of Session - 09/19/23 1057     Visit Number 9    Number of Visits 13    Date for PT Re-Evaluation 10/03/23    PT Start Time 0732    PT Stop Time 0815    PT Time Calculation (min) 43 min    Behavior During Therapy [facility] for tasks assessed/performed          Past Medical History:  Diagnosis Date   Allergy    Arthritis    Rheumatoid   GERD (gastroesophageal reflux disease)    Hypertension    Past Surgical History:  Procedure Laterality Date   COLONOSCOPY     COLONOSCOPY WITH PROPOFOL  N/A 08/21/2019   Procedure: COLONOSCOPY WITH PROPOFOL ;  Surgeon: Jinny Carmine, MD;  Location: [facility];  Service: Endoscopy;  Laterality: N/A;  priority 4   ESOPHAGOGASTRODUODENOSCOPY (EGD) WITH PROPOFOL  N/A 08/21/2019   Procedure: ESOPHAGOGASTRODUODENOSCOPY (EGD) WITH PROPOFOL ;  Surgeon: Jinny Carmine, MD;  Location: [facility];  Service: Endoscopy;  Laterality: N/A;   POLYPECTOMY  08/21/2019   Procedure: POLYPECTOMY;  Surgeon: Jinny Carmine, MD;  Location: [facility];  Service: Endoscopy;;   SHOULDER SURGERY     TESTICLE SURGERY     age 63 or 63   TONSILLECTOMY     Patient Active Problem List   Diagnosis Date Noted   Encounter for screening colonoscopy    Polyp of transverse colon    Heartburn    Pure hypercholesterolemia 01/26/2016   Right lateral epicondylitis 07/07/2015   Vitamin D  deficiency 12/24/2014   Prediabetes 12/24/2014   Allergic rhinitis, seasonal 06/03/2014   Acid reflux 06/03/2014   Essential (primary) hypertension 06/03/2014   Family history of diabetes mellitus 06/03/2014   Family history of cardiac disorder 06/03/2014   Obesity (BMI 30.0-34.9) 06/03/2014    PCP: Joshua Cathryne BROCKS, MD (Inactive)  REFERRING PROVIDER: Mayur Loree Blanch, MD, PharmD  REFERRING DIAG: [phone]  Lumbar spondylosis, M54.9 Back pain  RATIONALE FOR EVALUATION AND TREATMENT: Rehabilitation  THERAPY DIAG: Other low back pain  Lumbar spondylosis  Muscle weakness (generalized)  ONSET DATE: May 2025 (worsening symptoms, acute on chronic flare-up)  FOLLOW-UP APPT SCHEDULED WITH REFERRING PROVIDER: None currently in EMR  PERTINENT HISTORY: Pt is a 63 year old male with Parkinson's disease who ambulates at community-level without AD. Pt has new referral for lumbar spondylosis/LBP. Pt is well known to this clinic and has been attending PT with focus on gait/balance/strengthening.   Patient is s/p ESI at R L4-L5/L5-S1 neuroforamen. Pt reports acute flare-up of back pain in May of this year with worsening pain and R-sided symptoms primarily. Pt reports attempting to care for it on his own. He reports dealing with it most of this year prior to that. He feels that injections have helped. He state she still has pain along R side. Pt reports pain in R flank/R lower lumbar paraspinal region. Pt reports previous episode of L-sided sciatica - not present now. He reports no current paresthesias/numbness. Patient reports intermittent disturbed sleep due to low back.   Previous episode of care focused on gait, balance, and LLE weakness within context of Parkinson's disease. Pt met goals for outcome measures. He would still like to work on improving LLE strength and foot clearance.   PAIN:    Pain Intensity: Present: 4-6/10, Best: 0/10, Worst: 9/10 Pain location: R  lower lumbar paraspinal/R flank Pain Quality: sharp  Radiating: No  Numbness/Tingling: No Focal Weakness: Yes; weakness in LLE due to neurological deficits; no R-sided weakness per pt.  Aggravating factors: leaning over countertop/desktop, bending down to pick up items, prolonged sitting > 1-2 hours Relieving factors: Double knee to chest, heat, ice, Salonpas patch, Tylenol  24-hour pain behavior: worse late in the day  How long can you sit:  painful after 1-2 hours History of prior back injury, pain, surgery, or therapy: Yes; Hx of episodic back pain managed with chiropractic and medical management/injections  Dominant hand: right Imaging: Yes ;  CLINICAL DATA:  Low back pain progressively worsening. Right thigh numbness   EXAM: MRI LUMBAR SPINE WITHOUT CONTRAST   TECHNIQUE: Multiplanar, multisequence MR imaging of the lumbar spine was performed. No intravenous contrast was administered.   COMPARISON:  None Available.   FINDINGS: Segmentation:  Standard.   Alignment:  2 mm retrolisthesis of L2 on L3.   Vertebrae: No acute fracture, evidence of discitis, or aggressive bone lesion.   Conus medullaris and cauda equina: Conus extends to the T12-L1 level. Conus and cauda equina appear normal.   Paraspinal and other soft tissues: No acute paraspinal abnormality.   Disc levels:   Disc spaces: Degenerative disease with disc height loss at L5-S1. Mild disc height loss at L1-2. Disc desiccation at L1-2, L2-3 and L3-4.   T11-12: Mild broad-based disc bulge. No foraminal or central canal stenosis.   T12-L1: No significant disc bulge. No neural foraminal stenosis. No central canal stenosis. Mild bilateral facet arthropathy.   L1-L2: Mild broad-based disc bulge. Mild bilateral facet arthropathy. No foraminal or central canal stenosis.   L2-L3: Broad-based disc bulge. Mild bilateral facet arthropathy. Bilateral lateral recess narrowing. Minimal spinal stenosis. No foraminal stenosis.   L3-L4: Mild broad-based disc bulge. Mild bilateral facet arthropathy. No foraminal or central canal stenosis.   L4-L5: No significant disc bulge. No neural foraminal stenosis. No central canal stenosis. Mild bilateral facet arthropathy.   L5-S1: Broad-based disc osteophyte complex with a focal prominent right far lateral disc osteophyte complex. No foraminal or central canal stenosis.   IMPRESSION: 1. Lumbar spine spondylosis  as described above. 2. No acute osseous injury of the lumbar spine.   Red flags: Negative for bowel/bladder changes, saddle paresthesia, personal history of cancer, h/o spinal tumors, h/o compression fx, h/o abdominal aneurysm, abdominal pain, chills/fever, night sweats, nausea, vomiting, unrelenting pain, first onset of insidious LBP <20 y/o  PRECAUTIONS: None  WEIGHT BEARING RESTRICTIONS: No  FALLS: Has patient fallen in last 6 months? No  Living Environment Lives with: lives with their spouse and mother  Lives in: House/apartment; one step to get into home; stairs to get into home, pt has upstairs bedroom. Pt reports doing fairly well with stairs. 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   Hobbies: Visiting museums, sight-seeing, travel  Patient Goals: Reduced pain, improved movement     OBJECTIVE (data from initial evaluation unless otherwise dated):   Patient Surveys  ODI = 10/50 = 20%  GAIT: Distance walked: 40 ft Assistive device utilized: None Level of assistance: SBA Comments: Forward-flexed posture, dec step length  Posture: Moderate thoracic kyphosis and forward head, slouched posture c Hx of Parkinson's Lumbar lordosis: WNL Iliac crest height: Equal bilaterally Lumbar lateral shift:  Negative  AROM AROM (Normal range in degrees) AROM  08/21/23  Lumbar   Flexion (65) 75%*  Extension (30) 75%*  Right lateral flexion (25) 75%  Left lateral flexion (25) 75%  Right rotation (30) 100%  Left rotation (30) 50%      Hip Right Left  Flexion (125) WNL WNL  Extension (15)    Abduction (40)    Adduction     Internal Rotation (45)    External Rotation (45)        (* = pain; Blank rows = not tested)  LE MMT: MMT (out of 5) Right 08/21/23 Left 08/21/23  Hip  flexion 5 5  Hip extension    Hip abduction    Hip adduction    Hip internal rotation    Hip external rotation    Knee flexion 5 5  Knee extension 5 5-  Ankle dorsiflexion 4 4  Ankle plantarflexion    Ankle inversion    Ankle eversion    (* = pain; Blank rows = not tested)  Sensation Grossly intact to light touch throughout bilateral LEs as determined by testing dermatomes L2-S2. Proprioception, stereognosis, and hot/cold testing deferred on this date.  Reflexes R/L Knee Jerk (L3/4): 2+/2+  Ankle Jerk (S1/2): Unable to obtain  Clonus: Negative bilat   Muscle Length Hamstrings: R: Positive L: Positive E[place] (quadriceps): R: Positive L: Positive  Palpation Location Right Left         Lumbar paraspinals 2   Quadratus Lumborum 1   Gluteus Maximus 1   Gluteus Medius 0   Deep hip external rotators 0   PSIS 0   Fortin's Area (SIJ) 0   Greater Trochanter    (Blank rows = not tested) Graded on 0-4 scale (0 = no pain, 1 = pain, 2 = pain with wincing/grimacing/flinching, 3 = pain with withdrawal, 4 = unwilling to allow palpation)  Special Tests Lumbar Radiculopathy and Discogenic: Centralization and Peripheralization (SN 92, -LR 0.12): Not examined.  Slump (SN 83, -LR 0.32): R: Negative L: Negative SLR (SN 92, -LR 0.29): R: Negative L:  Negative   Facet Joint: Extension-Rotation (SN 100, -LR 0.0): R: Negative L: Negative  Lumbar Foraminal Stenosis: Lumbar quadrant (SN 70): R: Negative L: Negative  Hip: FABER (SN 81): R: Negative (pain with release of FABER on R;  L: Negative   Passive Accessory Intervertebral Motion Pain at restriction L3-5 CPA, moderate hypomobility of lower lumbar spine.     TODAY'S TREATMENT: DATE: 09/19/2023    SUBJECTIVE STATEMENT:   Patient reports 2/10 pain at arrival affecting R flank. He reports ongoing sleep disturbance 1-2 hours before his usual waking time. Patient reports at least partial compliance with HEP; when asking about  piriformis stretching, pt states he is performing it not as often as I should. Pt reports doing generally well during the day.     Manual Therapy - for symptom modulation, soft tissue sensitivity and mobility, joint mobility, ROM   STM R longissimus/iliocostalis lumborum L3-S1 and R gluteal musculature; x 15 minutes General manual lumbar traction in hooklying with Mulligan belt; therapist at foot of patient; 10 sec on, 10 sec off x 5 minutes for nerve root decompression, pain control   *not today* CPA and R UPA; 2 x 30 sec bouts, gr II for pain modulation; L3-L5   Trigger Point Dry Needling  Initial Treatment: Pt instructed on Dry Needling rational, procedures, and possible side effects. Pt instructed to expect mild to moderate muscle soreness later  in the day and/or into the next day.  Pt instructed to continue prescribed HEP. Patient verbalized understanding of these instructions and education.   Patient Verbal Consent Given: Yes Education Handout Provided: Yes Muscles Treated: R iliocostalis lumborum x 2, R L5 multifidus x 1 with 0.30 x 60 mm single needle placements  Electrical Stimulation Performed: No Treatment Response/Outcome: Mild post-treatment soreness, remaining R flank pain acutely. Pt exhibits min motion loss with forward flexion and no pain with flexion/extension AROM; no pain or motion loss with R/L lateral flexion AROM.      Therapeutic Exercise - for improved soft tissue flexibility and extensibility as needed for ROM, improved strength as needed to improve performance of CKC activities/functional movements, MDT/repeated movement for symptom modulation  NuStep; Level 3, x 6 minutes - for improved soft tissue mobility and increased tissue temperature to improve muscle performance   -subjective gathered during this time, treatment area prepared Lower trunk rotations, hooklying; 1 x 15 alt R/L   PATIENT EDUCATION: Encouraged continued HEP and no notable  restrictions/activity limitations following dry needling.     *not today* Bridge; 2 x 10 3 way kick, standing; x 10 ea dir, bilat  -Red Tband around distal shins  Supine piriformis stretch; 2 x 30 sec  Lower trunk rotations; with QL bias; x 10 (LLE crossed to stretch R QL today), 3 sec hold Hooklying lumbar traction, 10-sec intermittent holds; x 6 minutes, PT at foot of treatment table Open book; 1 x 10 R and L side Repeated extension in lying, 1 x 10: no effect during, better after    PATIENT EDUCATION:  Education details: see above for patient education details Person educated: Patient Education method: Explanation, Demonstration, and Handouts Education comprehension: verbalized understanding and returned demonstration   HOME EXERCISE PROGRAM:  Access Code: [email or web address] URL: [email or web address] Date: 09/12/2023 Prepared by: Venetia Endo  Exercises - Prone Press Up  - 5-6 x daily - 7 x weekly - 1 sets - 10 reps - 1sec hold - Supine Quadratus Lumborum Stretch  - 2 x daily - 7 x weekly - 2 sets - 10 reps - 2-3sec hold - Seated Hamstring Stretch  - 2 x daily - 7 x weekly - 3 sets - 30sec hold - Supine Piriformis Stretch with Foot on Ground  - 2 x daily - 7 x weekly - 3 sets - 30sec hold   ASSESSMENT:  CLINICAL IMPRESSION: Patient has made fair progress to date and fortunately has relatively low NPRS at arrival to PT appointments. He demonstrates markedly improved thoracolumbar AROM measurements overall. We completed initial dry needling treatment today due to persistent R flank pain that has disrupted sleep. Pt has mild post-treatment soreness and subjectively remaining R flank pain at end of session. Post-treatment thoracolumbar AROM is notably better than his baseline measures, but this can also be attributed to other intervention completed to date. Pt has current deficits in thoracolumbar AROM, decreased LLE strength, lumbar spine stiffness/hypomobility,  posterior chain tightness/dec flexibility. Pt will continue to benefit from skilled PT services to address deficits and improve function.   OBJECTIVE IMPAIRMENTS: Abnormal gait, decreased mobility, decreased ROM, decreased strength, hypomobility, impaired flexibility, postural dysfunction, and pain.   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  REHAB POTENTIAL: Good  CLINICAL DECISION MAKING: Evolving/moderate complexity  EVALUATION COMPLEXITY: Moderate   GOALS: Goals reviewed with patient? Yes  SHORT TERM GOALS: Target date: 09/12/2023  Pt will be independent with HEP in order to improve strength and decrease back pain to improve pain-free function at home and work. Baseline: 08/21/23: Baseline HEP initiated.  Goal status: INITIAL   LONG TERM GOALS: Target date: 10/10/2023  Patient will have full thoracolumbar AROM without reproduction of pain as needed for reaching items on ground, household chores, bending Baseline: 08/21/23: Motion loss and pain with flexion, motion loss with extension, bilateral lateral flexion, L rotation.  Goal status: INITIAL  2.  Pt will decrease worst back pain by at least 2 points on the NPRS in order to demonstrate clinically significant reduction in back pain. Baseline: 08/21/23: 9/10  Goal status: INITIAL  3.  Pt will decrease mODI score by at least 13 points in order demonstrate clinically significant reduction in back pain/disability.       Baseline: 08/21/23: 10/50 = 20% Goal status: INITIAL  4.  Pt will tolerate sitting for leisure activity and traveling up to 1 hour  without reproduction of pain.  Baseline: 08/21/23: Pain with sitting > 1-2 hr.  Goal status: INITIAL   PLAN: PT FREQUENCY: 1-2x/week  PT DURATION: 6 weeks  PLANNED INTERVENTIONS: Therapeutic exercises, Therapeutic activity, Neuromuscular re-education, Balance training, Gait training, Patient/Family education, Self Care, Joint mobilization, Joint manipulation, Vestibular training, Canalith repositioning, Orthotic/Fit training, DME instructions, Dry Needling, Electrical stimulation, Spinal manipulation, Spinal mobilization, Cryotherapy, Moist heat, Taping, Traction, Ultrasound, Ionotophoresis 4mg /ml Dexamethasone, Manual therapy, and Re-evaluation.  PLAN FOR NEXT SESSION: Continue with thoracolumbar mobility. Manual techniques/DTM along R QL/iliocostalis lumborum.    Venetia Endo, PT, DPT #[id number]  Venetia ONEIDA Endo, PT 09/19/2023, 10:57 AM

## 2023-09-24 ENCOUNTER — Ambulatory Visit: Admitting: Physical Therapy

## 2023-09-24 NOTE — Therapy (Deleted)
 OUTPATIENT PHYSICAL THERAPY TREATMENT AND PROGRESS NOTE   Dates of reporting period  08/21/23   to   09/24/23    Patient Name: Tyler Glover MRN: [id number] DOB:27-Feb-1960, 63 y.o., male Today's Date: 09/24/2023  END OF SESSION:    Past Medical History:  Diagnosis Date   Allergy    Arthritis    Rheumatoid   GERD (gastroesophageal reflux disease)    Hypertension    Past Surgical History:  Procedure Laterality Date   COLONOSCOPY     COLONOSCOPY WITH PROPOFOL  N/A 08/21/2019   Procedure: COLONOSCOPY WITH PROPOFOL ;  Surgeon: Jinny Carmine, MD;  Location: [facility];  Service: Endoscopy;  Laterality: N/A;  priority 4   ESOPHAGOGASTRODUODENOSCOPY (EGD) WITH PROPOFOL  N/A 08/21/2019   Procedure: ESOPHAGOGASTRODUODENOSCOPY (EGD) WITH PROPOFOL ;  Surgeon: Jinny Carmine, MD;  Location: [facility];  Service: Endoscopy;  Laterality: N/A;   POLYPECTOMY  08/21/2019   Procedure: POLYPECTOMY;  Surgeon: Jinny Carmine, MD;  Location: [facility];  Service: Endoscopy;;   SHOULDER SURGERY     TESTICLE SURGERY     age 12 or 16   TONSILLECTOMY     Patient Active Problem List   Diagnosis Date Noted   Encounter for screening colonoscopy    Polyp of transverse colon    Heartburn    Pure hypercholesterolemia 01/26/2016   Right lateral epicondylitis 07/07/2015   Vitamin D  deficiency 12/24/2014   Prediabetes 12/24/2014   Allergic rhinitis, seasonal 06/03/2014   Acid reflux 06/03/2014   Essential (primary) hypertension 06/03/2014   Family history of diabetes mellitus 06/03/2014   Family history of cardiac disorder 06/03/2014   Obesity (BMI 30.0-34.9) 06/03/2014    PCP: Joshua Cathryne BROCKS, MD (Inactive)  REFERRING PROVIDER: Mayur Loree Blanch, MD, PharmD  REFERRING DIAG: [phone] Lumbar spondylosis, M54.9 Back pain  RATIONALE FOR EVALUATION AND TREATMENT: Rehabilitation  THERAPY DIAG: Other low back pain  Lumbar spondylosis  Muscle weakness (generalized)  ONSET  DATE: May 2025 (worsening symptoms, acute on chronic flare-up)  FOLLOW-UP APPT SCHEDULED WITH REFERRING PROVIDER: None currently in EMR  PERTINENT HISTORY: Pt is a 63 year old male with Parkinson's disease who ambulates at community-level without AD. Pt has new referral for lumbar spondylosis/LBP. Pt is well known to this clinic and has been attending PT with focus on gait/balance/strengthening.   Patient is s/p ESI at R L4-L5/L5-S1 neuroforamen. Pt reports acute flare-up of back pain in May of this year with worsening pain and R-sided symptoms primarily. Pt reports attempting to care for it on his own. He reports dealing with it most of this year prior to that. He feels that injections have helped. He state she still has pain along R side. Pt reports pain in R flank/R lower lumbar paraspinal region. Pt reports previous episode of L-sided sciatica - not present now. He reports no current paresthesias/numbness. Patient reports intermittent disturbed sleep due to low back.   Previous episode of care focused on gait, balance, and LLE weakness within context of Parkinson's disease. Pt met goals for outcome measures. He would still like to work on improving LLE strength and foot clearance.   PAIN:    Pain Intensity: Present: 4-6/10, Best: 0/10, Worst: 9/10 Pain location: R lower lumbar paraspinal/R flank Pain Quality: sharp  Radiating: No  Numbness/Tingling: No Focal Weakness: Yes; weakness in LLE due to neurological deficits; no R-sided weakness per pt.  Aggravating factors: leaning over countertop/desktop, bending down to pick up items, prolonged sitting > 1-2 hours Relieving factors: Double knee to chest,  heat, ice, Salonpas patch, Tylenol  24-hour pain behavior: worse late in the day  How long can you sit: painful after 1-2 hours History of prior back injury, pain, surgery, or therapy: Yes; Hx of episodic back pain managed with chiropractic and medical management/injections  Dominant hand:  right Imaging: Yes ;  CLINICAL DATA:  Low back pain progressively worsening. Right thigh numbness   EXAM: MRI LUMBAR SPINE WITHOUT CONTRAST   TECHNIQUE: Multiplanar, multisequence MR imaging of the lumbar spine was performed. No intravenous contrast was administered.   COMPARISON:  None Available.   FINDINGS: Segmentation:  Standard.   Alignment:  2 mm retrolisthesis of L2 on L3.   Vertebrae: No acute fracture, evidence of discitis, or aggressive bone lesion.   Conus medullaris and cauda equina: Conus extends to the T12-L1 level. Conus and cauda equina appear normal.   Paraspinal and other soft tissues: No acute paraspinal abnormality.   Disc levels:   Disc spaces: Degenerative disease with disc height loss at L5-S1. Mild disc height loss at L1-2. Disc desiccation at L1-2, L2-3 and L3-4.   T11-12: Mild broad-based disc bulge. No foraminal or central canal stenosis.   T12-L1: No significant disc bulge. No neural foraminal stenosis. No central canal stenosis. Mild bilateral facet arthropathy.   L1-L2: Mild broad-based disc bulge. Mild bilateral facet arthropathy. No foraminal or central canal stenosis.   L2-L3: Broad-based disc bulge. Mild bilateral facet arthropathy. Bilateral lateral recess narrowing. Minimal spinal stenosis. No foraminal stenosis.   L3-L4: Mild broad-based disc bulge. Mild bilateral facet arthropathy. No foraminal or central canal stenosis.   L4-L5: No significant disc bulge. No neural foraminal stenosis. No central canal stenosis. Mild bilateral facet arthropathy.   L5-S1: Broad-based disc osteophyte complex with a focal prominent right far lateral disc osteophyte complex. No foraminal or central canal stenosis.   IMPRESSION: 1. Lumbar spine spondylosis as described above. 2. No acute osseous injury of the lumbar spine.   Red flags: Negative for bowel/bladder changes, saddle paresthesia, personal history of cancer, h/o spinal tumors,  h/o compression fx, h/o abdominal aneurysm, abdominal pain, chills/fever, night sweats, nausea, vomiting, unrelenting pain, first onset of insidious LBP <20 y/o  PRECAUTIONS: None  WEIGHT BEARING RESTRICTIONS: No  FALLS: Has patient fallen in last 6 months? No  Living Environment Lives with: lives with their spouse and mother  Lives in: House/apartment; one step to get into home; stairs to get into home, pt has upstairs bedroom. Pt reports doing fairly well with stairs. 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   Hobbies: Visiting museums, sight-seeing, travel  Patient Goals: Reduced pain, improved movement     OBJECTIVE (data from initial evaluation unless otherwise dated):   Patient Surveys  ODI = 10/50 = 20%  GAIT: Distance walked: 40 ft Assistive device utilized: None Level of assistance: SBA Comments: Forward-flexed posture, dec step length  Posture: Moderate thoracic kyphosis and forward head, slouched posture c Hx of Parkinson's Lumbar lordosis: WNL Iliac crest height: Equal bilaterally Lumbar lateral shift: Negative  AROM AROM (Normal range in degrees) AROM  08/21/23  Lumbar   Flexion (65) 75%*  Extension (30) 75%*  Right lateral flexion (25) 75%  Left lateral flexion (25) 75%  Right rotation (30) 100%  Left rotation (30) 50%      Hip Right  Left  Flexion (125) WNL WNL  Extension (15)    Abduction (40)    Adduction     Internal Rotation (45)    External Rotation (45)        (* = pain; Blank rows = not tested)  LE MMT: MMT (out of 5) Right 08/21/23 Left 08/21/23  Hip flexion 5 5  Hip extension    Hip abduction    Hip adduction    Hip internal rotation    Hip external rotation    Knee flexion 5 5  Knee extension 5 5-  Ankle dorsiflexion 4 4   Ankle plantarflexion    Ankle inversion    Ankle eversion    (* = pain; Blank rows = not tested)  Sensation Grossly intact to light touch throughout bilateral LEs as determined by testing dermatomes L2-S2. Proprioception, stereognosis, and hot/cold testing deferred on this date.  Reflexes R/L Knee Jerk (L3/4): 2+/2+  Ankle Jerk (S1/2): Unable to obtain  Clonus: Negative bilat   Muscle Length Hamstrings: R: Positive L: Positive E[place] (quadriceps): R: Positive L: Positive  Palpation Location Right Left         Lumbar paraspinals 2   Quadratus Lumborum 1   Gluteus Maximus 1   Gluteus Medius 0   Deep hip external rotators 0   PSIS 0   Fortin's Area (SIJ) 0   Greater Trochanter    (Blank rows = not tested) Graded on 0-4 scale (0 = no pain, 1 = pain, 2 = pain with wincing/grimacing/flinching, 3 = pain with withdrawal, 4 = unwilling to allow palpation)  Special Tests Lumbar Radiculopathy and Discogenic: Centralization and Peripheralization (SN 92, -LR 0.12): Not examined.  Slump (SN 83, -LR 0.32): R: Negative L: Negative SLR (SN 92, -LR 0.29): R: Negative L:  Negative   Facet Joint: Extension-Rotation (SN 100, -LR 0.0): R: Negative L: Negative  Lumbar Foraminal Stenosis: Lumbar quadrant (SN 70): R: Negative L: Negative  Hip: FABER (SN 81): R: Negative (pain with release of FABER on R;  L: Negative   Passive Accessory Intervertebral Motion Pain at restriction L3-5 CPA, moderate hypomobility of lower lumbar spine.     TODAY'S TREATMENT: DATE: 09/24/2023    SUBJECTIVE STATEMENT:   Patient reports 2/10 pain at arrival affecting R flank. He reports ongoing sleep disturbance 1-2 hours before his usual waking time. Patient reports at least partial compliance with HEP; when asking about piriformis stretching, pt states he is performing it not as often as I should. Pt reports doing generally well during the day.     Manual Therapy - for symptom modulation, soft tissue  sensitivity and mobility, joint mobility, ROM   STM R longissimus/iliocostalis lumborum L3-S1 and R gluteal musculature; x 15 minutes General manual lumbar traction in hooklying with Mulligan belt; therapist at foot of patient; 10 sec on, 10 sec off x 5 minutes for nerve root decompression, pain control   *not today* CPA and R UPA; 2 x 30 sec bouts, gr II for pain modulation; L3-L5   Trigger Point Dry Needling  Initial Treatment: Pt instructed on Dry Needling rational, procedures, and possible side effects. Pt instructed to expect mild to moderate muscle soreness later in the day and/or into the next day.  Pt instructed to continue prescribed HEP. Patient verbalized understanding of these instructions and education.   Patient Verbal Consent Given: Yes Education Handout Provided: Yes Muscles Treated: R iliocostalis lumborum x 2, R L5 multifidus x 1 with 0.30 x 60 mm  single needle placements  Electrical Stimulation Performed: No Treatment Response/Outcome: Mild post-treatment soreness, remaining R flank pain acutely. Pt exhibits min motion loss with forward flexion and no pain with flexion/extension AROM; no pain or motion loss with R/L lateral flexion AROM.      Therapeutic Exercise - for improved soft tissue flexibility and extensibility as needed for ROM, improved strength as needed to improve performance of CKC activities/functional movements, MDT/repeated movement for symptom modulation  NuStep; Level 3, x 6 minutes - for improved soft tissue mobility and increased tissue temperature to improve muscle performance   -subjective gathered during this time, treatment area prepared Lower trunk rotations, hooklying; 1 x 15 alt R/L   PATIENT EDUCATION: Encouraged continued HEP and no notable restrictions/activity limitations following dry needling.     *not today* Bridge; 2 x 10 3 way kick, standing; x 10 ea dir, bilat  -Red Tband around distal shins  Supine piriformis stretch; 2  x 30 sec  Lower trunk rotations; with QL bias; x 10 (LLE crossed to stretch R QL today), 3 sec hold Hooklying lumbar traction, 10-sec intermittent holds; x 6 minutes, PT at foot of treatment table Open book; 1 x 10 R and L side Repeated extension in lying, 1 x 10: no effect during, better after    PATIENT EDUCATION:  Education details: see above for patient education details Person educated: Patient Education method: Explanation, Demonstration, and Handouts Education comprehension: verbalized understanding and returned demonstration   HOME EXERCISE PROGRAM:  Access Code: [email or web address] URL: [email or web address] Date: 09/12/2023 Prepared by: Venetia Endo  Exercises - Prone Press Up  - 5-6 x daily - 7 x weekly - 1 sets - 10 reps - 1sec hold - Supine Quadratus Lumborum Stretch  - 2 x daily - 7 x weekly - 2 sets - 10 reps - 2-3sec hold - Seated Hamstring Stretch  - 2 x daily - 7 x weekly - 3 sets - 30sec hold - Supine Piriformis Stretch with Foot on Ground  - 2 x daily - 7 x weekly - 3 sets - 30sec hold   ASSESSMENT:  CLINICAL IMPRESSION: Patient has made fair progress to date and fortunately has relatively low NPRS at arrival to PT appointments. He demonstrates markedly improved thoracolumbar AROM measurements overall. We completed initial dry needling treatment today due to persistent R flank pain that has disrupted sleep. Pt has mild post-treatment soreness and subjectively remaining R flank pain at end of session. Post-treatment thoracolumbar AROM is notably better than his baseline measures, but this can also be attributed to other intervention completed to date. Pt has current deficits in thoracolumbar AROM, decreased LLE strength, lumbar spine stiffness/hypomobility, posterior chain tightness/dec flexibility. Pt will continue to benefit from skilled PT services to address deficits and improve function.   OBJECTIVE IMPAIRMENTS: Abnormal gait, decreased mobility,  decreased ROM, decreased strength, hypomobility, impaired flexibility, postural dysfunction, and pain.   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   REHAB POTENTIAL: Good  CLINICAL DECISION MAKING: Evolving/moderate complexity  EVALUATION COMPLEXITY: Moderate   GOALS: Goals reviewed with patient? Yes  SHORT TERM GOALS: Target date: 09/12/2023  Pt will be independent with HEP in order to improve strength and decrease back pain to improve pain-free function at home and  work. Baseline: 08/21/23: Baseline HEP initiated.  Goal status: INITIAL   LONG TERM GOALS: Target date: 10/10/2023  Patient will have full thoracolumbar AROM without reproduction of pain as needed for reaching items on ground, household chores, bending Baseline: 08/21/23: Motion loss and pain with flexion, motion loss with extension, bilateral lateral flexion, L rotation.  Goal status: INITIAL  2.  Pt will decrease worst back pain by at least 2 points on the NPRS in order to demonstrate clinically significant reduction in back pain. Baseline: 08/21/23: 9/10  Goal status: INITIAL  3.  Pt will decrease mODI score by at least 13 points in order demonstrate clinically significant reduction in back pain/disability.       Baseline: 08/21/23: 10/50 = 20% Goal status: INITIAL  4.  Pt will tolerate sitting for leisure activity and traveling up to 1 hour without reproduction of pain.  Baseline: 08/21/23: Pain with sitting > 1-2 hr.  Goal status: INITIAL   PLAN: PT FREQUENCY: 1-2x/week  PT DURATION: 6 weeks  PLANNED INTERVENTIONS: Therapeutic  exercises, Therapeutic activity, Neuromuscular re-education, Balance training, Gait training, Patient/Family education, Self Care, Joint mobilization, Joint manipulation, Vestibular training, Canalith repositioning, Orthotic/Fit training, DME instructions, Dry Needling, Electrical stimulation, Spinal manipulation, Spinal mobilization, Cryotherapy, Moist heat, Taping, Traction, Ultrasound, Ionotophoresis 4mg /ml Dexamethasone, Manual therapy, and Re-evaluation.  PLAN FOR NEXT SESSION: Continue with thoracolumbar mobility. Manual techniques/DTM along R QL/iliocostalis lumborum.    Venetia Endo, PT, DPT #[id number]  Venetia ONEIDA Endo, PT 09/24/2023, 1:35 PM

## 2023-09-26 ENCOUNTER — Ambulatory Visit: Admitting: Physical Therapy

## 2023-09-26 DIAGNOSIS — M47816 Spondylosis without myelopathy or radiculopathy, lumbar region: Secondary | ICD-10-CM

## 2023-09-26 DIAGNOSIS — M5459 Other low back pain: Secondary | ICD-10-CM | POA: Diagnosis not present

## 2023-09-26 DIAGNOSIS — M6281 Muscle weakness (generalized): Secondary | ICD-10-CM

## 2023-09-26 NOTE — Therapy (Unsigned)
 OUTPATIENT PHYSICAL THERAPY TREATMENT AND PROGRESS NOTE   Dates of reporting period  08/21/23   to   09/24/23   Patient Name: Tyler Glover MRN: [id number] DOB:Mar 28, 1960, 63 y.o., male Today's Date: 09/26/2023  END OF SESSION:  PT End of Session - 09/26/23 1651     Visit Number 10    Number of Visits 13    Date for PT Re-Evaluation 10/03/23    PT Start Time 1649    PT Stop Time 1728    PT Time Calculation (min) 39 min    Activity Tolerance Patient tolerated treatment well    Behavior During Therapy WFL for tasks assessed/performed          Past Medical History:  Diagnosis Date   Allergy    Arthritis    Rheumatoid   GERD (gastroesophageal reflux disease)    Hypertension    Past Surgical History:  Procedure Laterality Date   COLONOSCOPY     COLONOSCOPY WITH PROPOFOL  N/A 08/21/2019   Procedure: COLONOSCOPY WITH PROPOFOL ;  Surgeon: Jinny Carmine, MD;  Location: [facility];  Service: Endoscopy;  Laterality: N/A;  priority 4   ESOPHAGOGASTRODUODENOSCOPY (EGD) WITH PROPOFOL  N/A 08/21/2019   Procedure: ESOPHAGOGASTRODUODENOSCOPY (EGD) WITH PROPOFOL ;  Surgeon: Jinny Carmine, MD;  Location: [facility];  Service: Endoscopy;  Laterality: N/A;   POLYPECTOMY  08/21/2019   Procedure: POLYPECTOMY;  Surgeon: Jinny Carmine, MD;  Location: [facility];  Service: Endoscopy;;   SHOULDER SURGERY     TESTICLE SURGERY     age 14 or 19   TONSILLECTOMY     Patient Active Problem List   Diagnosis Date Noted   Encounter for screening colonoscopy    Polyp of transverse colon    Heartburn    Pure hypercholesterolemia 01/26/2016   Right lateral epicondylitis 07/07/2015   Vitamin D  deficiency 12/24/2014   Prediabetes 12/24/2014   Allergic rhinitis, seasonal 06/03/2014   Acid reflux 06/03/2014   Essential (primary) hypertension 06/03/2014   Family history of diabetes mellitus 06/03/2014   Family history of cardiac disorder 06/03/2014   Obesity (BMI 30.0-34.9)  06/03/2014    PCP: Joshua Cathryne BROCKS, MD (Inactive)  REFERRING PROVIDER: Mayur Loree Blanch, MD, PharmD  REFERRING DIAG: [phone] Lumbar spondylosis, M54.9 Back pain  RATIONALE FOR EVALUATION AND TREATMENT: Rehabilitation  THERAPY DIAG: Other low back pain  Lumbar spondylosis  Muscle weakness (generalized)  ONSET DATE: May 2025 (worsening symptoms, acute on chronic flare-up)  FOLLOW-UP APPT SCHEDULED WITH REFERRING PROVIDER: None currently in EMR  PERTINENT HISTORY: Pt is a 63 year old male with Parkinson's disease who ambulates at community-level without AD. Pt has new referral for lumbar spondylosis/LBP. Pt is well known to this clinic and has been attending PT with focus on gait/balance/strengthening.   Patient is s/p ESI at R L4-L5/L5-S1 neuroforamen. Pt reports acute flare-up of back pain in May of this year with worsening pain and R-sided symptoms primarily. Pt reports attempting to care for it on his own. He reports dealing with it most of this year prior to that. He feels that injections have helped. He state she still has pain along R side. Pt reports pain in R flank/R lower lumbar paraspinal region. Pt reports previous episode of L-sided sciatica - not present now. He reports no current paresthesias/numbness. Patient reports intermittent disturbed sleep due to low back.   Previous episode of care focused on gait, balance, and LLE weakness within context of Parkinson's disease. Pt met goals for outcome measures. He would still like  to work on improving LLE strength and foot clearance.   PAIN:    Pain Intensity: Present: 4-6/10, Best: 0/10, Worst: 9/10 Pain location: R lower lumbar paraspinal/R flank Pain Quality: sharp  Radiating: No  Numbness/Tingling: No Focal Weakness: Yes; weakness in LLE due to neurological deficits; no R-sided weakness per pt.  Aggravating factors: leaning over countertop/desktop, bending down to pick up items, prolonged sitting > 1-2 hours Relieving  factors: Double knee to chest, heat, ice, Salonpas patch, Tylenol  24-hour pain behavior: worse late in the day  How long can you sit: painful after 1-2 hours History of prior back injury, pain, surgery, or therapy: Yes; Hx of episodic back pain managed with chiropractic and medical management/injections  Dominant hand: right Imaging: Yes ;  CLINICAL DATA:  Low back pain progressively worsening. Right thigh numbness   EXAM: MRI LUMBAR SPINE WITHOUT CONTRAST   TECHNIQUE: Multiplanar, multisequence MR imaging of the lumbar spine was performed. No intravenous contrast was administered.   COMPARISON:  None Available.   FINDINGS: Segmentation:  Standard.   Alignment:  2 mm retrolisthesis of L2 on L3.   Vertebrae: No acute fracture, evidence of discitis, or aggressive bone lesion.   Conus medullaris and cauda equina: Conus extends to the T12-L1 level. Conus and cauda equina appear normal.   Paraspinal and other soft tissues: No acute paraspinal abnormality.   Disc levels:   Disc spaces: Degenerative disease with disc height loss at L5-S1. Mild disc height loss at L1-2. Disc desiccation at L1-2, L2-3 and L3-4.   T11-12: Mild broad-based disc bulge. No foraminal or central canal stenosis.   T12-L1: No significant disc bulge. No neural foraminal stenosis. No central canal stenosis. Mild bilateral facet arthropathy.   L1-L2: Mild broad-based disc bulge. Mild bilateral facet arthropathy. No foraminal or central canal stenosis.   L2-L3: Broad-based disc bulge. Mild bilateral facet arthropathy. Bilateral lateral recess narrowing. Minimal spinal stenosis. No foraminal stenosis.   L3-L4: Mild broad-based disc bulge. Mild bilateral facet arthropathy. No foraminal or central canal stenosis.   L4-L5: No significant disc bulge. No neural foraminal stenosis. No central canal stenosis. Mild bilateral facet arthropathy.   L5-S1: Broad-based disc osteophyte complex with a focal  prominent right far lateral disc osteophyte complex. No foraminal or central canal stenosis.   IMPRESSION: 1. Lumbar spine spondylosis as described above. 2. No acute osseous injury of the lumbar spine.   Red flags: Negative for bowel/bladder changes, saddle paresthesia, personal history of cancer, h/o spinal tumors, h/o compression fx, h/o abdominal aneurysm, abdominal pain, chills/fever, night sweats, nausea, vomiting, unrelenting pain, first onset of insidious LBP <20 y/o  PRECAUTIONS: None  WEIGHT BEARING RESTRICTIONS: No  FALLS: Has patient fallen in last 6 months? No  Living Environment Lives with: lives with their spouse and mother  Lives in: House/apartment; one step to get into home; stairs to get into home, pt has upstairs bedroom. Pt reports doing fairly well with stairs. 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   Hobbies: Visiting museums, sight-seeing, travel  Patient Goals: Reduced pain, improved movement     OBJECTIVE (data from initial evaluation unless otherwise dated):   Patient Surveys  ODI = 10/50 = 20%  GAIT: Distance walked: 40 ft Assistive device utilized: None Level of assistance: SBA Comments: Forward-flexed posture, dec step  length  Posture: Moderate thoracic kyphosis and forward head, slouched posture c Hx of Parkinson's Lumbar lordosis: WNL Iliac crest height: Equal bilaterally Lumbar lateral shift: Negative  AROM AROM (Normal range in degrees) AROM  08/21/23 AROM 09/26/23  Lumbar    Flexion (65) 75%* 75% (down to ankles), dull pain R flank unchanged  Extension (30) 75%* 75%*  Right lateral flexion (25) 75% WNL  Left lateral flexion (25) 75% WNL (tightness R paraspinals)  Right rotation (30) 100% WNL  Left rotation (30) 50% WNL        Hip Right Left   Flexion (125) WNL WNL   Extension (15)     Abduction (40)     Adduction      Internal Rotation (45)     External Rotation (45)          (* = pain; Blank rows = not tested)  LE MMT: MMT (out of 5) Right 08/21/23 Left 08/21/23  Hip flexion 5 5  Hip extension    Hip abduction    Hip adduction    Hip internal rotation    Hip external rotation    Knee flexion 5 5  Knee extension 5 5-  Ankle dorsiflexion 4 4  Ankle plantarflexion    Ankle inversion    Ankle eversion    (* = pain; Blank rows = not tested)  Sensation Grossly intact to light touch throughout bilateral LEs as determined by testing dermatomes L2-S2. Proprioception, stereognosis, and hot/cold testing deferred on this date.  Reflexes R/L Knee Jerk (L3/4): 2+/2+  Ankle Jerk (S1/2): Unable to obtain  Clonus: Negative bilat   Muscle Length Hamstrings: R: Positive L: Positive [place] (quadriceps): R: Positive L: Positive  Palpation Location Right Left         Lumbar paraspinals 2   Quadratus Lumborum 1   Gluteus Maximus 1   Gluteus Medius 0   Deep hip external rotators 0   PSIS 0   Fortin's Area (SIJ) 0   Greater Trochanter    (Blank rows = not tested) Graded on 0-4 scale (0 = no pain, 1 = pain, 2 = pain with wincing/grimacing/flinching, 3 = pain with withdrawal, 4 = unwilling to allow palpation)  Special Tests Lumbar Radiculopathy and Discogenic: Centralization and Peripheralization (SN 92, -LR 0.12): Not examined.  Slump (SN 83, -LR 0.32): R: Negative L: Negative SLR (SN 92, -LR 0.29): R: Negative L:  Negative   Facet Joint: Extension-Rotation (SN 100, -LR 0.0): R: Negative L: Negative  Lumbar Foraminal Stenosis: Lumbar quadrant (SN 70): R: Negative L: Negative  Hip: FABER (SN 81): R: Negative (pain with release of FABER on R;  L: Negative   Passive Accessory Intervertebral Motion Pain at restriction L3-5 CPA, moderate hypomobility of lower lumbar spine.     TODAY'S  TREATMENT: DATE: 09/26/2023    SUBJECTIVE STATEMENT:   Patient reports a little sore feeling after dry needling. He feels that it definitely helped. Patient reports 2-4/10 pain through day today. Patient reports notable pain with having to bend at waist when completing computer work and this aggravated his symptoms. 60% global rating of improvement to date. Patient reports largely R hip pain that is worse with bending over and getting back up.    *GOAL UPDATE PERFORMED   Manual Therapy - for symptom modulation, soft tissue sensitivity and mobility, joint mobility, ROM   STM R longissimus/iliocostalis lumborum L3-S1 and R gluteal musculature; x 15 minutes General manual lumbar traction in hooklying with Mulligan belt;  therapist at foot of patient; 10 sec on, 10 sec off x 5 minutes for nerve root decompression, pain control   *not today* CPA and R UPA; 2 x 30 sec bouts, gr II for pain modulation; L3-L5   Trigger Point Dry Needling  Initial Treatment: Pt instructed on Dry Needling rational, procedures, and possible side effects. Pt instructed to expect mild to moderate muscle soreness later in the day and/or into the next day.  Pt instructed to continue prescribed HEP. Patient verbalized understanding of these instructions and education.   Patient Verbal Consent Given: Yes Education Handout Provided: Yes Muscles Treated: R iliocostalis lumborum x 2, R L5 multifidus x 1 with 0.30 x 60 mm single needle placements  Electrical Stimulation Performed: No Treatment Response/Outcome: Mild post-treatment soreness, remaining R flank pain acutely. Pt exhibits min motion loss with forward flexion and no pain with flexion/extension AROM; no pain or motion loss with R/L lateral flexion AROM.      Therapeutic Exercise - for improved soft tissue flexibility and extensibility as needed for ROM, improved strength as needed to improve performance of CKC activities/functional movements, MDT/repeated  movement for symptom modulation  NuStep; Level 5, x 6 minutes - for improved soft tissue mobility and increased tissue temperature to improve muscle performance   -subjective gathered during this time, treatment area prepared Lower trunk rotations, hooklying; 1 x 15 alt R/L   PATIENT EDUCATION: Encouraged continued HEP and no notable restrictions/activity limitations following dry needling.     *not today* Bridge; 2 x 10 3 way kick, standing; x 10 ea dir, bilat  -Red Tband around distal shins  Supine piriformis stretch; 2 x 30 sec  Lower trunk rotations; with QL bias; x 10 (LLE crossed to stretch R QL today), 3 sec hold Hooklying lumbar traction, 10-sec intermittent holds; x 6 minutes, PT at foot of treatment table Open book; 1 x 10 R and L side Repeated extension in lying, 1 x 10: no effect during, better after    PATIENT EDUCATION:  Education details: see above for patient education details Person educated: Patient Education method: Explanation, Demonstration, and Handouts Education comprehension: verbalized understanding and returned demonstration   HOME EXERCISE PROGRAM:  Access Code: [email or web address] URL: [email or web address] Date: 09/12/2023 Prepared by: Venetia Endo  Exercises - Prone Press Up  - 5-6 x daily - 7 x weekly - 1 sets - 10 reps - 1sec hold - Supine Quadratus Lumborum Stretch  - 2 x daily - 7 x weekly - 2 sets - 10 reps - 2-3sec hold - Seated Hamstring Stretch  - 2 x daily - 7 x weekly - 3 sets - 30sec hold - Supine Piriformis Stretch with Foot on Ground  - 2 x daily - 7 x weekly - 3 sets - 30sec hold   ASSESSMENT:  CLINICAL IMPRESSION: Patient has fortunately markedly progressed with sitting tolerance and thoracolumbar AROM. He demonstrates normal lateral flexion and rotation bilaterally - only mild tightness with lateral flexion away from side of primary symptoms. Pt has mODI ***. Patient Pt has current deficits in thoracolumbar AROM,  decreased LLE strength, lumbar spine stiffness/hypomobility, posterior chain tightness/dec flexibility. Pt will continue to benefit from skilled PT services to address deficits and improve function.   OBJECTIVE IMPAIRMENTS: Abnormal gait, decreased mobility, decreased ROM, decreased strength, hypomobility, impaired flexibility, postural dysfunction, and pain.   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   REHAB POTENTIAL: Good  CLINICAL DECISION MAKING: Evolving/moderate complexity  EVALUATION COMPLEXITY: Moderate   GOALS: Goals reviewed with patient? Yes  SHORT TERM GOALS: Target date: 09/12/2023  Pt will be independent with HEP in order to improve strength and decrease back pain to improve pain-free function at home and work. Baseline: 08/21/23: Baseline HEP initiated.     09/26/23: *** Goal status: INITIAL   LONG TERM GOALS: Target date: 10/10/2023  Patient will have full thoracolumbar AROM without reproduction of pain as needed for reaching items on ground, household chores, bending Baseline: 08/21/23: Motion loss and pain with flexion, motion loss with extension, bilateral lateral flexion, L rotation.    09/26/23: Motion loss and pain with flexion and extension. No notable motion loss or pain in frontal or transverse plane.  Goal status: IN PROGRESS  2.  Pt will decrease worst back pain by at least 2 points on the NPRS in order to demonstrate clinically significant reduction in back pain. Baseline: 08/21/23: 9/10    09/26/23: 8/10 at worst over previous week Goal status: IN PROGRESS  3.  Pt will  decrease mODI score by at least 13 points in order demonstrate clinically significant reduction in back pain/disability.       Baseline: 08/21/23: 10/50 = 20%      09/26/23: 11/50 = 22% Goal status: NOT MET   4.  Pt will tolerate sitting for leisure activity and traveling up to 1 hour without reproduction of pain.  Baseline: 08/21/23: Pain with sitting > 1-2 hr.    09/26/23: Up to 2 hours.  Goal status: ACHIEVED    PLAN: PT FREQUENCY: 1-2x/week  PT DURATION: 6 weeks  PLANNED INTERVENTIONS: Therapeutic exercises, Therapeutic activity, Neuromuscular re-education, Balance training, Gait training, Patient/Family education, Self Care, Joint mobilization, Joint manipulation, Vestibular training, Canalith repositioning, Orthotic/Fit training, DME instructions, Dry Needling, Electrical stimulation, Spinal manipulation, Spinal mobilization, Cryotherapy, Moist heat, Taping, Traction, Ultrasound, Ionotophoresis 4mg /ml Dexamethasone, Manual therapy, and Re-evaluation.  PLAN FOR NEXT SESSION: Continue with thoracolumbar mobility. Manual techniques/DTM along R QL/iliocostalis lumborum.    Venetia Endo, PT, DPT #[id number]  Venetia ONEIDA Endo, PT 09/26/2023, 4:51 PM

## 2023-09-30 ENCOUNTER — Ambulatory Visit: Admitting: Physical Therapy

## 2023-09-30 ENCOUNTER — Encounter: Payer: Self-pay | Admitting: Physical Therapy

## 2023-09-30 NOTE — Therapy (Deleted)
 OUTPATIENT PHYSICAL THERAPY TREATMENT   Patient Name: Tyler Glover MRN: [id number] DOB:[birth year], 63 y.o., male Today's Date: 09/30/2023  END OF SESSION:    Past Medical History:  Diagnosis Date   Allergy    Arthritis    Rheumatoid   GERD (gastroesophageal reflux disease)    Hypertension    Past Surgical History:  Procedure Laterality Date   COLONOSCOPY     COLONOSCOPY WITH PROPOFOL  N/A 08/21/2019   Procedure: COLONOSCOPY WITH PROPOFOL ;  Surgeon: Jinny Carmine, MD;  Location: [facility];  Service: Endoscopy;  Laterality: N/A;  priority 4   ESOPHAGOGASTRODUODENOSCOPY (EGD) WITH PROPOFOL  N/A 08/21/2019   Procedure: ESOPHAGOGASTRODUODENOSCOPY (EGD) WITH PROPOFOL ;  Surgeon: Jinny Carmine, MD;  Location: [facility];  Service: Endoscopy;  Laterality: N/A;   POLYPECTOMY  08/21/2019   Procedure: POLYPECTOMY;  Surgeon: Jinny Carmine, MD;  Location: [facility];  Service: Endoscopy;;   SHOULDER SURGERY     TESTICLE SURGERY     age 57 or 65   TONSILLECTOMY     Patient Active Problem List   Diagnosis Date Noted   Encounter for screening colonoscopy    Polyp of transverse colon    Heartburn    Pure hypercholesterolemia 01/26/2016   Right lateral epicondylitis 07/07/2015   Vitamin D  deficiency 12/24/2014   Prediabetes 12/24/2014   Allergic rhinitis, seasonal 06/03/2014   Acid reflux 06/03/2014   Essential (primary) hypertension 06/03/2014   Family history of diabetes mellitus 06/03/2014   Family history of cardiac disorder 06/03/2014   Obesity (BMI 30.0-34.9) 06/03/2014    PCP: Joshua Cathryne BROCKS, MD (Inactive)  REFERRING PROVIDER: Mayur Loree Blanch, MD, PharmD  REFERRING DIAG: [phone] Lumbar spondylosis, M54.9 Back pain  RATIONALE FOR EVALUATION AND TREATMENT: Rehabilitation  THERAPY DIAG: Other low back pain  Lumbar spondylosis  Muscle weakness (generalized)  ONSET DATE: May 2025 (worsening symptoms, acute on chronic flare-up)  FOLLOW-UP APPT  SCHEDULED WITH REFERRING PROVIDER: None currently in EMR  PERTINENT HISTORY: Pt is a 63 year old male with Parkinson's disease who ambulates at community-level without AD. Pt has new referral for lumbar spondylosis/LBP. Pt is well known to this clinic and has been attending PT with focus on gait/balance/strengthening.   Patient is s/p ESI at R L4-L5/L5-S1 neuroforamen. Pt reports acute flare-up of back pain in May of this year with worsening pain and R-sided symptoms primarily. Pt reports attempting to care for it on his own. He reports dealing with it most of this year prior to that. He feels that injections have helped. He state she still has pain along R side. Pt reports pain in R flank/R lower lumbar paraspinal region. Pt reports previous episode of L-sided sciatica - not present now. He reports no current paresthesias/numbness. Patient reports intermittent disturbed sleep due to low back.   Previous episode of care focused on gait, balance, and LLE weakness within context of Parkinson's disease. Pt met goals for outcome measures. He would still like to work on improving LLE strength and foot clearance.   PAIN:    Pain Intensity: Present: 4-6/10, Best: 0/10, Worst: 9/10 Pain location: R lower lumbar paraspinal/R flank Pain Quality: sharp  Radiating: No  Numbness/Tingling: No Focal Weakness: Yes; weakness in LLE due to neurological deficits; no R-sided weakness per pt.  Aggravating factors: leaning over countertop/desktop, bending down to pick up items, prolonged sitting > 1-2 hours Relieving factors: Double knee to chest, heat, ice, Salonpas patch, Tylenol  24-hour pain behavior: worse late in the day  How long can  you sit: painful after 1-2 hours History of prior back injury, pain, surgery, or therapy: Yes; Hx of episodic back pain managed with chiropractic and medical management/injections  Dominant hand: right Imaging: Yes ;  CLINICAL DATA:  Low back pain progressively worsening. Right  thigh numbness   EXAM: MRI LUMBAR SPINE WITHOUT CONTRAST   TECHNIQUE: Multiplanar, multisequence MR imaging of the lumbar spine was performed. No intravenous contrast was administered.   COMPARISON:  None Available.   FINDINGS: Segmentation:  Standard.   Alignment:  2 mm retrolisthesis of L2 on L3.   Vertebrae: No acute fracture, evidence of discitis, or aggressive bone lesion.   Conus medullaris and cauda equina: Conus extends to the T12-L1 level. Conus and cauda equina appear normal.   Paraspinal and other soft tissues: No acute paraspinal abnormality.   Disc levels:   Disc spaces: Degenerative disease with disc height loss at L5-S1. Mild disc height loss at L1-2. Disc desiccation at L1-2, L2-3 and L3-4.   T11-12: Mild broad-based disc bulge. No foraminal or central canal stenosis.   T12-L1: No significant disc bulge. No neural foraminal stenosis. No central canal stenosis. Mild bilateral facet arthropathy.   L1-L2: Mild broad-based disc bulge. Mild bilateral facet arthropathy. No foraminal or central canal stenosis.   L2-L3: Broad-based disc bulge. Mild bilateral facet arthropathy. Bilateral lateral recess narrowing. Minimal spinal stenosis. No foraminal stenosis.   L3-L4: Mild broad-based disc bulge. Mild bilateral facet arthropathy. No foraminal or central canal stenosis.   L4-L5: No significant disc bulge. No neural foraminal stenosis. No central canal stenosis. Mild bilateral facet arthropathy.   L5-S1: Broad-based disc osteophyte complex with a focal prominent right far lateral disc osteophyte complex. No foraminal or central canal stenosis.   IMPRESSION: 1. Lumbar spine spondylosis as described above. 2. No acute osseous injury of the lumbar spine.   Red flags: Negative for bowel/bladder changes, saddle paresthesia, personal history of cancer, h/o spinal tumors, h/o compression fx, h/o abdominal aneurysm, abdominal pain, chills/fever, night sweats,  nausea, vomiting, unrelenting pain, first onset of insidious LBP <20 y/o  PRECAUTIONS: None  WEIGHT BEARING RESTRICTIONS: No  FALLS: Has patient fallen in last 6 months? No  Living Environment Lives with: lives with their spouse and mother  Lives in: House/apartment; one step to get into home; stairs to get into home, pt has upstairs bedroom. Pt reports doing fairly well with stairs. 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   Hobbies: Visiting museums, sight-seeing, travel  Patient Goals: Reduced pain, improved movement     OBJECTIVE (data from initial evaluation unless otherwise dated):   Patient Surveys  ODI = 10/50 = 20%  GAIT: Distance walked: 40 ft Assistive device utilized: None Level of assistance: SBA Comments: Forward-flexed posture, dec step length  Posture: Moderate thoracic kyphosis and forward head, slouched posture c Hx of Parkinson's Lumbar lordosis: WNL Iliac crest height: Equal bilaterally Lumbar lateral shift: Negative  AROM AROM (Normal range in degrees) AROM  08/21/23 AROM 09/26/23  Lumbar    Flexion (65) 75%* 75% (down to ankles), dull pain R flank unchanged  Extension (30) 75%* 75%*  Right lateral flexion (25) 75% WNL  Left lateral flexion (25) 75% WNL (tightness R paraspinals)  Right rotation (30) 100% WNL  Left rotation (30) 50% WNL  Hip Right Left   Flexion (125) WNL WNL   Extension (15)     Abduction (40)     Adduction      Internal Rotation (45)     External Rotation (45)          (* = pain; Blank rows = not tested)  LE MMT: MMT (out of 5) Right 08/21/23 Left 08/21/23  Hip flexion 5 5  Hip extension    Hip abduction    Hip adduction    Hip internal rotation    Hip external rotation    Knee flexion 5 5  Knee  extension 5 5-  Ankle dorsiflexion 4 4  Ankle plantarflexion    Ankle inversion    Ankle eversion    (* = pain; Blank rows = not tested)  Sensation Grossly intact to light touch throughout bilateral LEs as determined by testing dermatomes L2-S2. Proprioception, stereognosis, and hot/cold testing deferred on this date.  Reflexes R/L Knee Jerk (L3/4): 2+/2+  Ankle Jerk (S1/2): Unable to obtain  Clonus: Negative bilat   Muscle Length Hamstrings: R: Positive L: Positive [place] (quadriceps): R: Positive L: Positive  Palpation Location Right Left         Lumbar paraspinals 2   Quadratus Lumborum 1   Gluteus Maximus 1   Gluteus Medius 0   Deep hip external rotators 0   PSIS 0   Fortin's Area (SIJ) 0   Greater Trochanter    (Blank rows = not tested) Graded on 0-4 scale (0 = no pain, 1 = pain, 2 = pain with wincing/grimacing/flinching, 3 = pain with withdrawal, 4 = unwilling to allow palpation)  Special Tests Lumbar Radiculopathy and Discogenic: Centralization and Peripheralization (SN 92, -LR 0.12): Not examined.  Slump (SN 83, -LR 0.32): R: Negative L: Negative SLR (SN 92, -LR 0.29): R: Negative L:  Negative   Facet Joint: Extension-Rotation (SN 100, -LR 0.0): R: Negative L: Negative  Lumbar Foraminal Stenosis: Lumbar quadrant (SN 70): R: Negative L: Negative  Hip: FABER (SN 81): R: Negative (pain with release of FABER on R;  L: Negative   Passive Accessory Intervertebral Motion Pain at restriction L3-5 CPA, moderate hypomobility of lower lumbar spine.     TODAY'S TREATMENT: DATE: 09/30/2023    SUBJECTIVE STATEMENT:   Patient reports a little sore feeling after dry needling. He feels that it definitely helped. Patient reports 2-4/10 pain through day today. Patient reports notable pain with having to bend at waist when completing computer work and this aggravated his symptoms. 60% global rating of improvement to date. Patient reports largely R hip pain that is worse  with bending over and getting back up.    *GOAL UPDATE PERFORMED   Manual Therapy - for symptom modulation, soft tissue sensitivity and mobility, joint mobility, ROM   STM R longissimus/iliocostalis lumborum L3-S1 and R gluteal musculature; x 15 minutes  *not today* General manual lumbar traction in hooklying with Mulligan belt; therapist at foot of patient; 10 sec on, 10 sec off x 5 minutes for nerve root decompression, pain control CPA and R UPA; 2 x 30 sec bouts, gr II for pain modulation; L3-L5   Trigger Point Dry Needling  Initial Treatment: Pt instructed on Dry Needling rational, procedures, and possible side effects. Pt instructed to expect mild to moderate muscle soreness later in the day and/or into the next day.  Pt instructed to continue prescribed HEP. Patient verbalized understanding of these instructions and education.   Patient Verbal Consent  Given: Yes Education Handout Provided: Yes Muscles Treated: R iliocostalis lumborum x 2, R L5 multifidus x 1 with 0.30 x 60 mm single needle placements  Electrical Stimulation Performed: No Treatment Response/Outcome: Mild post-treatment soreness, remaining R flank pain acutely. Pt exhibits min motion loss with forward flexion and no pain with flexion/extension AROM; no pain or motion loss with R/L lateral flexion AROM.      Therapeutic Exercise - for improved soft tissue flexibility and extensibility as needed for ROM, improved strength as needed to improve performance of CKC activities/functional movements, MDT/repeated movement for symptom modulation  NuStep; Level 5, x 6 minutes - for improved soft tissue mobility and increased tissue temperature to improve muscle performance   -subjective gathered during this time, treatment area prepared Lower trunk rotations, hooklying; 1 x 15 alt R/L Supine piriformis stretch; 2 x 30 sec   PATIENT EDUCATION: Discussed current progress made, prognosis, POC    *not today* Bridge; 2  x 10 3 way kick, standing; x 10 ea dir, bilat  -Red Tband around distal shins  Lower trunk rotations; with QL bias; x 10 (LLE crossed to stretch R QL today), 3 sec hold Hooklying lumbar traction, 10-sec intermittent holds; x 6 minutes, PT at foot of treatment table Open book; 1 x 10 R and L side Repeated extension in lying, 1 x 10: no effect during, better after    PATIENT EDUCATION:  Education details: see above for patient education details Person educated: Patient Education method: Explanation, Demonstration, and Handouts Education comprehension: verbalized understanding and returned demonstration   HOME EXERCISE PROGRAM:  Access Code: [email or web address] URL: [email or web address] Date: 09/12/2023 Prepared by: Venetia Endo  Exercises - Prone Press Up  - 5-6 x daily - 7 x weekly - 1 sets - 10 reps - 1sec hold - Supine Quadratus Lumborum Stretch  - 2 x daily - 7 x weekly - 2 sets - 10 reps - 2-3sec hold - Seated Hamstring Stretch  - 2 x daily - 7 x weekly - 3 sets - 30sec hold - Supine Piriformis Stretch with Foot on Ground  - 2 x daily - 7 x weekly - 3 sets - 30sec hold   ASSESSMENT:  CLINICAL IMPRESSION: Patient has fortunately markedly progressed with sitting tolerance and thoracolumbar AROM. He demonstrates normal lateral flexion and rotation bilaterally - only mild tightness with lateral flexion away from side of primary symptoms. Pt has mODI increased by 1 point (statistically unchanged). Pt fortunately has fair sitting duration tolerated compared to that reported at [facility]. We continued with dry needling today for R paraspinal sensitivity and referred pain from lower lumbar spine with moderate post-op soreness. Patient Pt has current deficits in thoracolumbar AROM, decreased LLE strength, lumbar spine stiffness/hypomobility, posterior chain tightness/dec flexibility. Pt will continue to benefit from skilled PT services to address deficits and improve function.   OBJECTIVE  IMPAIRMENTS: Abnormal gait, decreased mobility, decreased ROM, decreased strength, hypomobility, impaired flexibility, postural dysfunction, and pain.   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   REHAB POTENTIAL: Good  CLINICAL DECISION MAKING: Evolving/moderate complexity  EVALUATION COMPLEXITY: Moderate   GOALS: Goals reviewed with patient? Yes  SHORT TERM GOALS: Target date: 09/12/2023  Pt will be independent with HEP in order to improve  strength and decrease back pain to improve pain-free function at home and work. Baseline: 08/21/23: Baseline HEP initiated.     09/26/23: Pt mostly compliant with HEP, some missed days or decreased frequency with his schedule.  Goal status: IN PROGRESS   LONG TERM GOALS: Target date: 10/10/2023  Patient will have full thoracolumbar AROM without reproduction of pain as needed for reaching items on ground, household chores, bending Baseline: 08/21/23: Motion loss and pain with flexion, motion loss with extension, bilateral lateral flexion, L rotation.    09/26/23: Motion loss and pain with flexion and extension. No notable motion loss or pain in frontal or transverse plane.  Goal status: IN PROGRESS  2.  Pt will decrease worst back pain by at least 2 points on the NPRS in order to demonstrate clinically significant reduction in back pain. Baseline: 08/21/23: 9/10    09/26/23: 8/10 at worst over previous week Goal status: IN PROGRESS  3.  Pt will decrease mODI score by at least 13 points in order demonstrate clinically significant reduction in back pain/disability.        Baseline: 08/21/23: 10/50 = 20%      09/26/23: 11/50 = 22% Goal status: NOT MET   4.  Pt will tolerate sitting for leisure activity and traveling up to 1 hour without reproduction of pain.  Baseline: 08/21/23: Pain with sitting > 1-2 hr.    09/26/23: Up to 2 hours.  Goal status: ACHIEVED    PLAN: PT FREQUENCY: 1-2x/week  PT DURATION: 3-4 weeks  PLANNED INTERVENTIONS: Therapeutic exercises, Therapeutic activity, Neuromuscular re-education, Balance training, Gait training, Patient/Family education, Self Care, Joint mobilization, Joint manipulation, Vestibular training, Canalith repositioning, Orthotic/Fit training, DME instructions, Dry Needling, Electrical stimulation, Spinal manipulation, Spinal mobilization, Cryotherapy, Moist heat, Taping, Traction, Ultrasound, Ionotophoresis 4mg /ml Dexamethasone, Manual therapy, and Re-evaluation.  PLAN FOR NEXT SESSION: Continue with thoracolumbar mobility. Manual techniques/DTM along R QL/iliocostalis lumborum. Dry needling at future visits prn. Postural re-education.    Venetia Endo, PT, DPT #[id number]  Venetia ONEIDA Endo, PT 09/30/2023, 1:56 PM

## 2023-10-02 ENCOUNTER — Ambulatory Visit: Admitting: Physical Therapy

## 2023-10-02 ENCOUNTER — Encounter: Payer: Self-pay | Admitting: Physical Therapy

## 2023-10-02 DIAGNOSIS — M5459 Other low back pain: Secondary | ICD-10-CM

## 2023-10-02 DIAGNOSIS — M6281 Muscle weakness (generalized): Secondary | ICD-10-CM

## 2023-10-02 DIAGNOSIS — M47816 Spondylosis without myelopathy or radiculopathy, lumbar region: Secondary | ICD-10-CM

## 2023-10-02 NOTE — Therapy (Signed)
 OUTPATIENT PHYSICAL THERAPY TREATMENT   Patient Name: Tyler Glover MRN: [id number] DOB:[birth year], 63 y.o., male Today's Date: 10/02/2023  END OF SESSION:  PT End of Session - 10/02/23 1723     Visit Number 11    Number of Visits 13    Date for PT Re-Evaluation 10/03/23    PT Start Time 1720    PT Stop Time 1801    PT Time Calculation (min) 41 min    Activity Tolerance Patient tolerated treatment well    Behavior During Therapy WFL for tasks assessed/performed           Past Medical History:  Diagnosis Date   Allergy    Arthritis    Rheumatoid   GERD (gastroesophageal reflux disease)    Hypertension    Past Surgical History:  Procedure Laterality Date   COLONOSCOPY     COLONOSCOPY WITH PROPOFOL  N/A 08/21/2019   Procedure: COLONOSCOPY WITH PROPOFOL ;  Surgeon: Jinny Carmine, MD;  Location: [facility];  Service: Endoscopy;  Laterality: N/A;  priority 4   ESOPHAGOGASTRODUODENOSCOPY (EGD) WITH PROPOFOL  N/A 08/21/2019   Procedure: ESOPHAGOGASTRODUODENOSCOPY (EGD) WITH PROPOFOL ;  Surgeon: Jinny Carmine, MD;  Location: [facility];  Service: Endoscopy;  Laterality: N/A;   POLYPECTOMY  08/21/2019   Procedure: POLYPECTOMY;  Surgeon: Jinny Carmine, MD;  Location: [facility];  Service: Endoscopy;;   SHOULDER SURGERY     TESTICLE SURGERY     age 51 or 55   TONSILLECTOMY     Patient Active Problem List   Diagnosis Date Noted   Encounter for screening colonoscopy    Polyp of transverse colon    Heartburn    Pure hypercholesterolemia 01/26/2016   Right lateral epicondylitis 07/07/2015   Vitamin D  deficiency 12/24/2014   Prediabetes 12/24/2014   Allergic rhinitis, seasonal 06/03/2014   Acid reflux 06/03/2014   Essential (primary) hypertension 06/03/2014   Family history of diabetes mellitus 06/03/2014   Family history of cardiac disorder 06/03/2014   Obesity (BMI 30.0-34.9) 06/03/2014    PCP: Joshua Cathryne BROCKS, MD (Inactive)  REFERRING PROVIDER:  Mayur Loree Blanch, MD, PharmD  REFERRING DIAG: [phone] Lumbar spondylosis, M54.9 Back pain  RATIONALE FOR EVALUATION AND TREATMENT: Rehabilitation  THERAPY DIAG: Other low back pain  Lumbar spondylosis  Muscle weakness (generalized)  ONSET DATE: May 2025 (worsening symptoms, acute on chronic flare-up)  FOLLOW-UP APPT SCHEDULED WITH REFERRING PROVIDER: None currently in EMR  PERTINENT HISTORY: Pt is a 63 year old male with Parkinson's disease who ambulates at community-level without AD. Pt has new referral for lumbar spondylosis/LBP. Pt is well known to this clinic and has been attending PT with focus on gait/balance/strengthening.   Patient is s/p ESI at R L4-L5/L5-S1 neuroforamen. Pt reports acute flare-up of back pain in May of this year with worsening pain and R-sided symptoms primarily. Pt reports attempting to care for it on his own. He reports dealing with it most of this year prior to that. He feels that injections have helped. He state she still has pain along R side. Pt reports pain in R flank/R lower lumbar paraspinal region. Pt reports previous episode of L-sided sciatica - not present now. He reports no current paresthesias/numbness. Patient reports intermittent disturbed sleep due to low back.   Previous episode of care focused on gait, balance, and LLE weakness within context of Parkinson's disease. Pt met goals for outcome measures. He would still like to work on improving LLE strength and foot clearance.   PAIN:    Pain  Intensity: Present: 4-6/10, Best: 0/10, Worst: 9/10 Pain location: R lower lumbar paraspinal/R flank Pain Quality: sharp  Radiating: No  Numbness/Tingling: No Focal Weakness: Yes; weakness in LLE due to neurological deficits; no R-sided weakness per pt.  Aggravating factors: leaning over countertop/desktop, bending down to pick up items, prolonged sitting > 1-2 hours Relieving factors: Double knee to chest, heat, ice, Salonpas patch, Tylenol  24-hour  pain behavior: worse late in the day  How long can you sit: painful after 1-2 hours History of prior back injury, pain, surgery, or therapy: Yes; Hx of episodic back pain managed with chiropractic and medical management/injections  Dominant hand: right Imaging: Yes ;  CLINICAL DATA:  Low back pain progressively worsening. Right thigh numbness   EXAM: MRI LUMBAR SPINE WITHOUT CONTRAST   TECHNIQUE: Multiplanar, multisequence MR imaging of the lumbar spine was performed. No intravenous contrast was administered.   COMPARISON:  None Available.   FINDINGS: Segmentation:  Standard.   Alignment:  2 mm retrolisthesis of L2 on L3.   Vertebrae: No acute fracture, evidence of discitis, or aggressive bone lesion.   Conus medullaris and cauda equina: Conus extends to the T12-L1 level. Conus and cauda equina appear normal.   Paraspinal and other soft tissues: No acute paraspinal abnormality.   Disc levels:   Disc spaces: Degenerative disease with disc height loss at L5-S1. Mild disc height loss at L1-2. Disc desiccation at L1-2, L2-3 and L3-4.   T11-12: Mild broad-based disc bulge. No foraminal or central canal stenosis.   T12-L1: No significant disc bulge. No neural foraminal stenosis. No central canal stenosis. Mild bilateral facet arthropathy.   L1-L2: Mild broad-based disc bulge. Mild bilateral facet arthropathy. No foraminal or central canal stenosis.   L2-L3: Broad-based disc bulge. Mild bilateral facet arthropathy. Bilateral lateral recess narrowing. Minimal spinal stenosis. No foraminal stenosis.   L3-L4: Mild broad-based disc bulge. Mild bilateral facet arthropathy. No foraminal or central canal stenosis.   L4-L5: No significant disc bulge. No neural foraminal stenosis. No central canal stenosis. Mild bilateral facet arthropathy.   L5-S1: Broad-based disc osteophyte complex with a focal prominent right far lateral disc osteophyte complex. No foraminal or  central canal stenosis.   IMPRESSION: 1. Lumbar spine spondylosis as described above. 2. No acute osseous injury of the lumbar spine.   Red flags: Negative for bowel/bladder changes, saddle paresthesia, personal history of cancer, h/o spinal tumors, h/o compression fx, h/o abdominal aneurysm, abdominal pain, chills/fever, night sweats, nausea, vomiting, unrelenting pain, first onset of insidious LBP <20 y/o  PRECAUTIONS: None  WEIGHT BEARING RESTRICTIONS: No  FALLS: Has patient fallen in last 6 months? No  Living Environment Lives with: lives with their spouse and mother  Lives in: House/apartment; one step to get into home; stairs to get into home, pt has upstairs bedroom. Pt reports doing fairly well with stairs. 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   Hobbies: Visiting museums, sight-seeing, travel  Patient Goals: Reduced pain, improved movement     OBJECTIVE (data from initial evaluation unless otherwise dated):   Patient Surveys  ODI = 10/50 = 20%  GAIT: Distance walked: 40 ft Assistive device utilized: None Level of assistance: SBA Comments: Forward-flexed posture, dec step length  Posture: Moderate thoracic kyphosis and forward head, slouched posture c Hx of Parkinson's Lumbar  lordosis: WNL Iliac crest height: Equal bilaterally Lumbar lateral shift: Negative  AROM AROM (Normal range in degrees) AROM  08/21/23 AROM 09/26/23  Lumbar    Flexion (65) 75%* 75% (down to ankles), dull pain R flank unchanged  Extension (30) 75%* 75%*  Right lateral flexion (25) 75% WNL  Left lateral flexion (25) 75% WNL (tightness R paraspinals)  Right rotation (30) 100% WNL  Left rotation (30) 50% WNL       Hip Right Left   Flexion (125) WNL WNL   Extension (15)      Abduction (40)     Adduction      Internal Rotation (45)     External Rotation (45)          (* = pain; Blank rows = not tested)  LE MMT: MMT (out of 5) Right 08/21/23 Left 08/21/23  Hip flexion 5 5  Hip extension    Hip abduction    Hip adduction    Hip internal rotation    Hip external rotation    Knee flexion 5 5  Knee extension 5 5-  Ankle dorsiflexion 4 4  Ankle plantarflexion    Ankle inversion    Ankle eversion    (* = pain; Blank rows = not tested)  Sensation Grossly intact to light touch throughout bilateral LEs as determined by testing dermatomes L2-S2. Proprioception, stereognosis, and hot/cold testing deferred on this date.  Reflexes R/L Knee Jerk (L3/4): 2+/2+  Ankle Jerk (S1/2): Unable to obtain  Clonus: Negative bilat   Muscle Length Hamstrings: R: Positive L: Positive [place] (quadriceps): R: Positive L: Positive  Palpation Location Right Left         Lumbar paraspinals 2   Quadratus Lumborum 1   Gluteus Maximus 1   Gluteus Medius 0   Deep hip external rotators 0   PSIS 0   Fortin's Area (SIJ) 0   Greater Trochanter    (Blank rows = not tested) Graded on 0-4 scale (0 = no pain, 1 = pain, 2 = pain with wincing/grimacing/flinching, 3 = pain with withdrawal, 4 = unwilling to allow palpation)  Special Tests Lumbar Radiculopathy and Discogenic: Centralization and Peripheralization (SN 92, -LR 0.12): Not examined.  Slump (SN 83, -LR 0.32): R: Negative L: Negative SLR (SN 92, -LR 0.29): R: Negative L:  Negative   Facet Joint: Extension-Rotation (SN 100, -LR 0.0): R: Negative L: Negative  Lumbar Foraminal Stenosis: Lumbar quadrant (SN 70): R: Negative L: Negative  Hip: FABER (SN 81): R: Negative (pain with release of FABER on R;  L: Negative   Passive Accessory Intervertebral Motion Pain at restriction L3-5 CPA, moderate hypomobility of lower lumbar spine.     TODAY'S TREATMENT: DATE: 10/02/2023    SUBJECTIVE STATEMENT:   Patient reports   2/10 pain at arrival with R paraspinal pain near midline. Patient reports tolerating classwork and safety training with students well. Patient reports doing pretty well with needling last visit. He reports further episodes of pain between 3:30-5:30 AM disrupting last 2 hours of sleep.   OBJECTIVE FINDINGS  AROM Lumbar flexion: WFL (to dorsum of bilat feet)* mild pain Lumbar extension: WFL* mild pain Lateral flexion: R WFL, L WFL Thoracolumbar rotation: R WFL, L WFL    Manual Therapy - for symptom modulation, soft tissue sensitivity and mobility, joint mobility, ROM   STM R longissimus/iliocostalis lumborum L3-S1 and R gluteal musculature; x 15 minutes  *not today* General manual lumbar traction in hooklying with Mulligan belt; therapist at  foot of patient; 10 sec on, 10 sec off x 5 minutes for nerve root decompression, pain control CPA and R UPA; 2 x 30 sec bouts, gr II for pain modulation; L3-L5   Trigger Point Dry Needling  Initial Treatment: Pt instructed on Dry Needling rational, procedures, and possible side effects. Pt instructed to expect mild to moderate muscle soreness later in the day and/or into the next day.  Pt instructed to continue prescribed HEP. Patient verbalized understanding of these instructions and education.   Patient Verbal Consent Given: Yes Education Handout Provided: Yes Muscles Treated: R iliocostalis lumborum, R L4 and R L5 multifidus x 1 with 0.30 x 60 mm single needle placements  Electrical Stimulation Performed: No Treatment Response/Outcome: Mild post-treatment soreness, remaining R flank pain acutely. Pt exhibits min motion loss with forward flexion and no pain with flexion/extension AROM; no pain or motion loss with R/L lateral flexion AROM.      Therapeutic Exercise - for improved soft tissue flexibility and extensibility as needed for ROM, improved strength as needed to improve performance of CKC activities/functional movements, MDT/repeated  movement for symptom modulation  NuStep; Level 5, x 6 minutes - for improved soft tissue mobility and increased tissue temperature to improve muscle performance   -subjective gathered during this time, treatment area prepared Lower trunk rotations, hooklying; 1 x 15 alt R/L Open book; 1 x 10 R and L side  PATIENT EDUCATION: Discussed current progress made, prognosis, POC    *not today* Supine piriformis stretch; 2 x 30 sec  Bridge; 2 x 10 3 way kick, standing; x 10 ea dir, bilat  -Red Tband around distal shins  Lower trunk rotations; with QL bias; x 10 (LLE crossed to stretch R QL today), 3 sec hold Hooklying lumbar traction, 10-sec intermittent holds; x 6 minutes, PT at foot of treatment table Repeated extension in lying, 1 x 10: no effect during, better after    PATIENT EDUCATION:  Education details: see above for patient education details Person educated: Patient Education method: Explanation, Demonstration, and Handouts Education comprehension: verbalized understanding and returned demonstration   HOME EXERCISE PROGRAM:  Access Code: [email or web address] URL: [email or web address] Date: 09/12/2023 Prepared by: Venetia Endo  Exercises - Prone Press Up  - 5-6 x daily - 7 x weekly - 1 sets - 10 reps - 1sec hold - Supine Quadratus Lumborum Stretch  - 2 x daily - 7 x weekly - 2 sets - 10 reps - 2-3sec hold - Seated Hamstring Stretch  - 2 x daily - 7 x weekly - 3 sets - 30sec hold - Supine Piriformis Stretch with Foot on Ground  - 2 x daily - 7 x weekly - 3 sets - 30sec hold   ASSESSMENT:  CLINICAL IMPRESSION: Patient exhibits normal frontal and transverse plane thoracolumbar AROM without reproduction of symptoms. He has responded relatively well with dry needling and has tolerated work duties and ADLs well. Pt is mainly concerned with remaining pain at night disturbing his sleep a couple of hours before his usual wake-up time. We briefly reviewed positional strategies  for pain relief and encouraged continued HEP. We continued with dry needling today for R paraspinal sensitivity and referred pain from lower lumbar spine with moderate post-op soreness. Pt has current deficits in thoracolumbar AROM, decreased LLE strength, lumbar spine stiffness/hypomobility, posterior chain tightness/dec flexibility. Pt will continue to benefit from skilled PT services to address deficits and improve function.   OBJECTIVE IMPAIRMENTS: Abnormal gait, decreased mobility, decreased ROM, decreased strength, hypomobility,  impaired flexibility, postural dysfunction, and pain.   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   REHAB POTENTIAL: Good  CLINICAL DECISION MAKING: Evolving/moderate complexity  EVALUATION COMPLEXITY: Moderate   GOALS: Goals reviewed with patient? Yes  SHORT TERM GOALS: Target date: 09/12/2023  Pt will be independent with HEP in order to improve strength and decrease back pain to improve pain-free function at home and work. Baseline: 08/21/23: Baseline HEP initiated.     09/26/23: Pt mostly compliant with HEP, some missed days or decreased frequency with his schedule.  Goal status: IN PROGRESS   LONG TERM GOALS: Target date: 10/10/2023  Patient will have full thoracolumbar AROM without reproduction of pain as needed for reaching items on ground, household chores, bending Baseline: 08/21/23: Motion loss and pain with flexion, motion loss with extension, bilateral lateral flexion, L rotation.    09/26/23: Motion loss and pain with flexion and extension. No notable motion loss or  pain in frontal or transverse plane.  Goal status: IN PROGRESS  2.  Pt will decrease worst back pain by at least 2 points on the NPRS in order to demonstrate clinically significant reduction in back pain. Baseline: 08/21/23: 9/10    09/26/23: 8/10 at worst over previous week Goal status: IN PROGRESS  3.  Pt will decrease mODI score by at least 13 points in order demonstrate clinically significant reduction in back pain/disability.       Baseline: 08/21/23: 10/50 = 20%      09/26/23: 11/50 = 22% Goal status: NOT MET   4.  Pt will tolerate sitting for leisure activity and traveling up to 1 hour without reproduction of pain.  Baseline: 08/21/23: Pain with sitting > 1-2 hr.    09/26/23: Up to 2 hours.  Goal status: ACHIEVED    PLAN: PT FREQUENCY: 1-2x/week  PT DURATION: 3-4 weeks  PLANNED INTERVENTIONS: Therapeutic exercises, Therapeutic activity, Neuromuscular re-education, Balance training, Gait training, Patient/Family education, Self Care, Joint mobilization, Joint manipulation, Vestibular training, Canalith repositioning, Orthotic/Fit training, DME instructions, Dry Needling, Electrical stimulation, Spinal manipulation, Spinal mobilization, Cryotherapy, Moist heat, Taping, Traction, Ultrasound, Ionotophoresis 4mg /ml Dexamethasone, Manual therapy, and Re-evaluation.  PLAN FOR NEXT SESSION: Continue with thoracolumbar mobility. Manual techniques/DTM along R QL/iliocostalis lumborum. Dry needling at future visits prn. Postural re-education.    Venetia Endo, PT, DPT #[id number]  Venetia ONEIDA Endo, PT 10/02/2023, 6:01 PM

## 2023-10-07 ENCOUNTER — Ambulatory Visit: Admitting: Physical Therapy

## 2023-10-07 NOTE — Therapy (Deleted)
 OUTPATIENT PHYSICAL THERAPY TREATMENT   Patient Name: Tyler Glover MRN: [id number] DOB:10-07-1960, 63 y.o., male Today's Date: 10/07/2023  END OF SESSION:     Past Medical History:  Diagnosis Date   Allergy    Arthritis    Rheumatoid   GERD (gastroesophageal reflux disease)    Hypertension    Past Surgical History:  Procedure Laterality Date   COLONOSCOPY     COLONOSCOPY WITH PROPOFOL  N/A 08/21/2019   Procedure: COLONOSCOPY WITH PROPOFOL ;  Surgeon: Jinny Carmine, MD;  Location: [facility];  Service: Endoscopy;  Laterality: N/A;  priority 4   ESOPHAGOGASTRODUODENOSCOPY (EGD) WITH PROPOFOL  N/A 08/21/2019   Procedure: ESOPHAGOGASTRODUODENOSCOPY (EGD) WITH PROPOFOL ;  Surgeon: Jinny Carmine, MD;  Location: [facility];  Service: Endoscopy;  Laterality: N/A;   POLYPECTOMY  08/21/2019   Procedure: POLYPECTOMY;  Surgeon: Jinny Carmine, MD;  Location: [facility];  Service: Endoscopy;;   SHOULDER SURGERY     TESTICLE SURGERY     age 51 or 71   TONSILLECTOMY     Patient Active Problem List   Diagnosis Date Noted   Encounter for screening colonoscopy    Polyp of transverse colon    Heartburn    Pure hypercholesterolemia 01/26/2016   Right lateral epicondylitis 07/07/2015   Vitamin D  deficiency 12/24/2014   Prediabetes 12/24/2014   Allergic rhinitis, seasonal 06/03/2014   Acid reflux 06/03/2014   Essential (primary) hypertension 06/03/2014   Family history of diabetes mellitus 06/03/2014   Family history of cardiac disorder 06/03/2014   Obesity (BMI 30.0-34.9) 06/03/2014    PCP: Joshua Cathryne BROCKS, MD (Inactive)  REFERRING PROVIDER: Mayur Loree Blanch, MD, PharmD  REFERRING DIAG: [phone] Lumbar spondylosis, M54.9 Back pain  RATIONALE FOR EVALUATION AND TREATMENT: Rehabilitation  THERAPY DIAG: Other low back pain  Lumbar spondylosis  Muscle weakness (generalized)  ONSET DATE: May 2025 (worsening symptoms, acute on chronic flare-up)  FOLLOW-UP  APPT SCHEDULED WITH REFERRING PROVIDER: None currently in EMR  PERTINENT HISTORY: Pt is a 63 year old male with Parkinson's disease who ambulates at community-level without AD. Pt has new referral for lumbar spondylosis/LBP. Pt is well known to this clinic and has been attending PT with focus on gait/balance/strengthening.   Patient is s/p ESI at R L4-L5/L5-S1 neuroforamen. Pt reports acute flare-up of back pain in May of this year with worsening pain and R-sided symptoms primarily. Pt reports attempting to care for it on his own. He reports dealing with it most of this year prior to that. He feels that injections have helped. He state she still has pain along R side. Pt reports pain in R flank/R lower lumbar paraspinal region. Pt reports previous episode of L-sided sciatica - not present now. He reports no current paresthesias/numbness. Patient reports intermittent disturbed sleep due to low back.   Previous episode of care focused on gait, balance, and LLE weakness within context of Parkinson's disease. Pt met goals for outcome measures. He would still like to work on improving LLE strength and foot clearance.   PAIN:    Pain Intensity: Present: 4-6/10, Best: 0/10, Worst: 9/10 Pain location: R lower lumbar paraspinal/R flank Pain Quality: sharp  Radiating: No  Numbness/Tingling: No Focal Weakness: Yes; weakness in LLE due to neurological deficits; no R-sided weakness per pt.  Aggravating factors: leaning over countertop/desktop, bending down to pick up items, prolonged sitting > 1-2 hours Relieving factors: Double knee to chest, heat, ice, Salonpas patch, Tylenol  24-hour pain behavior: worse late in the day  How long  can you sit: painful after 1-2 hours History of prior back injury, pain, surgery, or therapy: Yes; Hx of episodic back pain managed with chiropractic and medical management/injections  Dominant hand: right Imaging: Yes ;  CLINICAL DATA:  Low back pain progressively worsening.  Right thigh numbness   EXAM: MRI LUMBAR SPINE WITHOUT CONTRAST   TECHNIQUE: Multiplanar, multisequence MR imaging of the lumbar spine was performed. No intravenous contrast was administered.   COMPARISON:  None Available.   FINDINGS: Segmentation:  Standard.   Alignment:  2 mm retrolisthesis of L2 on L3.   Vertebrae: No acute fracture, evidence of discitis, or aggressive bone lesion.   Conus medullaris and cauda equina: Conus extends to the T12-L1 level. Conus and cauda equina appear normal.   Paraspinal and other soft tissues: No acute paraspinal abnormality.   Disc levels:   Disc spaces: Degenerative disease with disc height loss at L5-S1. Mild disc height loss at L1-2. Disc desiccation at L1-2, L2-3 and L3-4.   T11-12: Mild broad-based disc bulge. No foraminal or central canal stenosis.   T12-L1: No significant disc bulge. No neural foraminal stenosis. No central canal stenosis. Mild bilateral facet arthropathy.   L1-L2: Mild broad-based disc bulge. Mild bilateral facet arthropathy. No foraminal or central canal stenosis.   L2-L3: Broad-based disc bulge. Mild bilateral facet arthropathy. Bilateral lateral recess narrowing. Minimal spinal stenosis. No foraminal stenosis.   L3-L4: Mild broad-based disc bulge. Mild bilateral facet arthropathy. No foraminal or central canal stenosis.   L4-L5: No significant disc bulge. No neural foraminal stenosis. No central canal stenosis. Mild bilateral facet arthropathy.   L5-S1: Broad-based disc osteophyte complex with a focal prominent right far lateral disc osteophyte complex. No foraminal or central canal stenosis.   IMPRESSION: 1. Lumbar spine spondylosis as described above. 2. No acute osseous injury of the lumbar spine.   Red flags: Negative for bowel/bladder changes, saddle paresthesia, personal history of cancer, h/o spinal tumors, h/o compression fx, h/o abdominal aneurysm, abdominal pain, chills/fever, night  sweats, nausea, vomiting, unrelenting pain, first onset of insidious LBP <61 y/o  PRECAUTIONS: None  WEIGHT BEARING RESTRICTIONS: No  FALLS: Has patient fallen in last 6 months? No  Living Environment Lives with: lives with their spouse and mother  Lives in: House/apartment; one step to get into home; stairs to get into home, pt has upstairs bedroom. Pt reports doing fairly well with stairs. 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   Hobbies: Visiting museums, sight-seeing, travel  Patient Goals: Reduced pain, improved movement     OBJECTIVE (data from initial evaluation unless otherwise dated):   Patient Surveys  ODI = 10/50 = 20%  GAIT: Distance walked: 40 ft Assistive device utilized: None Level of assistance: SBA Comments: Forward-flexed posture, dec step length  Posture: Moderate thoracic kyphosis and forward head, slouched posture c Hx of Parkinson's Lumbar lordosis: WNL Iliac crest height: Equal bilaterally Lumbar lateral shift: Negative  AROM AROM (Normal range in degrees) AROM  08/21/23 AROM 09/26/23  Lumbar    Flexion (65) 75%* 75% (down to ankles), dull pain R flank unchanged  Extension (30) 75%* 75%*  Right lateral flexion (25) 75% WNL  Left lateral flexion (25) 75% WNL (tightness R paraspinals)  Right rotation (30) 100% WNL  Left rotation (30) 50% WNL  Hip Right Left   Flexion (125) WNL WNL   Extension (15)     Abduction (40)     Adduction      Internal Rotation (45)     External Rotation (45)          (* = pain; Blank rows = not tested)  LE MMT: MMT (out of 5) Right 08/21/23 Left 08/21/23  Hip flexion 5 5  Hip extension    Hip abduction    Hip adduction    Hip internal rotation    Hip external rotation    Knee flexion 5 5  Knee  extension 5 5-  Ankle dorsiflexion 4 4  Ankle plantarflexion    Ankle inversion    Ankle eversion    (* = pain; Blank rows = not tested)  Sensation Grossly intact to light touch throughout bilateral LEs as determined by testing dermatomes L2-S2. Proprioception, stereognosis, and hot/cold testing deferred on this date.  Reflexes R/L Knee Jerk (L3/4): 2+/2+  Ankle Jerk (S1/2): Unable to obtain  Clonus: Negative bilat   Muscle Length Hamstrings: R: Positive L: Positive [place] (quadriceps): R: Positive L: Positive  Palpation Location Right Left         Lumbar paraspinals 2   Quadratus Lumborum 1   Gluteus Maximus 1   Gluteus Medius 0   Deep hip external rotators 0   PSIS 0   Fortin's Area (SIJ) 0   Greater Trochanter    (Blank rows = not tested) Graded on 0-4 scale (0 = no pain, 1 = pain, 2 = pain with wincing/grimacing/flinching, 3 = pain with withdrawal, 4 = unwilling to allow palpation)  Special Tests Lumbar Radiculopathy and Discogenic: Centralization and Peripheralization (SN 92, -LR 0.12): Not examined.  Slump (SN 83, -LR 0.32): R: Negative L: Negative SLR (SN 92, -LR 0.29): R: Negative L:  Negative   Facet Joint: Extension-Rotation (SN 100, -LR 0.0): R: Negative L: Negative  Lumbar Foraminal Stenosis: Lumbar quadrant (SN 70): R: Negative L: Negative  Hip: FABER (SN 81): R: Negative (pain with release of FABER on R;  L: Negative   Passive Accessory Intervertebral Motion Pain at restriction L3-5 CPA, moderate hypomobility of lower lumbar spine.     TODAY'S TREATMENT: DATE: 10/07/2023    SUBJECTIVE STATEMENT:   Patient reports  2/10 pain at arrival with R paraspinal pain near midline. Patient reports tolerating classwork and safety training with students well. Patient reports doing pretty well with needling last visit. He reports further episodes of pain between 3:30-5:30 AM disrupting last 2 hours of sleep.   OBJECTIVE FINDINGS  AROM Lumbar flexion: WFL  (to dorsum of bilat feet)* mild pain Lumbar extension: WFL* mild pain Lateral flexion: R WFL, L WFL Thoracolumbar rotation: R WFL, L WFL    Manual Therapy - for symptom modulation, soft tissue sensitivity and mobility, joint mobility, ROM   STM R longissimus/iliocostalis lumborum L3-S1 and R gluteal musculature; x 15 minutes  *not today* General manual lumbar traction in hooklying with Mulligan belt; therapist at foot of patient; 10 sec on, 10 sec off x 5 minutes for nerve root decompression, pain control CPA and R UPA; 2 x 30 sec bouts, gr II for pain modulation; L3-L5   Trigger Point Dry Needling  Initial Treatment: Pt instructed on Dry Needling rational, procedures, and possible side effects. Pt instructed to expect mild to moderate muscle soreness later in the day and/or into the next day.  Pt instructed to continue prescribed HEP. Patient verbalized  understanding of these instructions and education.   Patient Verbal Consent Given: Yes Education Handout Provided: Yes Muscles Treated: R iliocostalis lumborum, R L4 and R L5 multifidus x 1 with 0.30 x 60 mm single needle placements  Electrical Stimulation Performed: No Treatment Response/Outcome: Mild post-treatment soreness, remaining R flank pain acutely. Pt exhibits min motion loss with forward flexion and no pain with flexion/extension AROM; no pain or motion loss with R/L lateral flexion AROM.      Therapeutic Exercise - for improved soft tissue flexibility and extensibility as needed for ROM, improved strength as needed to improve performance of CKC activities/functional movements, MDT/repeated movement for symptom modulation  NuStep; Level 5, x 6 minutes - for improved soft tissue mobility and increased tissue temperature to improve muscle performance   -subjective gathered during this time, treatment area prepared Lower trunk rotations, hooklying; 1 x 15 alt R/L Open book; 1 x 10 R and L side  PATIENT EDUCATION:  Discussed current progress made, prognosis, POC    *not today* Supine piriformis stretch; 2 x 30 sec  Bridge; 2 x 10 3 way kick, standing; x 10 ea dir, bilat  -Red Tband around distal shins  Lower trunk rotations; with QL bias; x 10 (LLE crossed to stretch R QL today), 3 sec hold Hooklying lumbar traction, 10-sec intermittent holds; x 6 minutes, PT at foot of treatment table Repeated extension in lying, 1 x 10: no effect during, better after    PATIENT EDUCATION:  Education details: see above for patient education details Person educated: Patient Education method: Explanation, Demonstration, and Handouts Education comprehension: verbalized understanding and returned demonstration   HOME EXERCISE PROGRAM:  Access Code: [email or web address] URL: [email or web address] Date: 09/12/2023 Prepared by: Venetia Endo  Exercises - Prone Press Up  - 5-6 x daily - 7 x weekly - 1 sets - 10 reps - 1sec hold - Supine Quadratus Lumborum Stretch  - 2 x daily - 7 x weekly - 2 sets - 10 reps - 2-3sec hold - Seated Hamstring Stretch  - 2 x daily - 7 x weekly - 3 sets - 30sec hold - Supine Piriformis Stretch with Foot on Ground  - 2 x daily - 7 x weekly - 3 sets - 30sec hold   ASSESSMENT:  CLINICAL IMPRESSION: Patient exhibits normal frontal and transverse plane thoracolumbar AROM without reproduction of symptoms. He has responded relatively well with dry needling and has tolerated work duties and ADLs well. Pt is mainly concerned with remaining pain at night disturbing his sleep a couple of hours before his usual wake-up time. We briefly reviewed positional strategies for pain relief and encouraged continued HEP. We continued with dry needling today for R paraspinal sensitivity and referred pain from lower lumbar spine with moderate post-op soreness. Pt has current deficits in thoracolumbar AROM, decreased LLE strength, lumbar spine stiffness/hypomobility, posterior chain tightness/dec  flexibility. Pt will continue to benefit from skilled PT services to address deficits and improve function.   OBJECTIVE IMPAIRMENTS: Abnormal gait, decreased mobility, decreased ROM, decreased strength, hypomobility, impaired flexibility, postural dysfunction, and pain.   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   REHAB POTENTIAL: Good  CLINICAL DECISION MAKING: Evolving/moderate complexity  EVALUATION COMPLEXITY: Moderate   GOALS: Goals reviewed with patient?  Yes  SHORT TERM GOALS: Target date: 09/12/2023  Pt will be independent with HEP in order to improve strength and decrease back pain to improve pain-free function at home and work. Baseline: 08/21/23: Baseline HEP initiated.     09/26/23: Pt mostly compliant with HEP, some missed days or decreased frequency with his schedule.  Goal status: IN PROGRESS   LONG TERM GOALS: Target date: 10/10/2023  Patient will have full thoracolumbar AROM without reproduction of pain as needed for reaching items on ground, household chores, bending Baseline: 08/21/23: Motion loss and pain with flexion, motion loss with extension, bilateral lateral flexion, L rotation.    09/26/23: Motion loss and pain with flexion and extension. No notable motion loss or pain in frontal or transverse plane.  Goal status: IN PROGRESS  2.  Pt will decrease worst back pain by at least 2 points on the NPRS in order to demonstrate clinically significant reduction in back pain. Baseline: 08/21/23: 9/10    09/26/23: 8/10 at worst over previous week Goal status: IN PROGRESS  3.  Pt will decrease  mODI score by at least 13 points in order demonstrate clinically significant reduction in back pain/disability.       Baseline: 08/21/23: 10/50 = 20%      09/26/23: 11/50 = 22% Goal status: NOT MET   4.  Pt will tolerate sitting for leisure activity and traveling up to 1 hour without reproduction of pain.  Baseline: 08/21/23: Pain with sitting > 1-2 hr.    09/26/23: Up to 2 hours.  Goal status: ACHIEVED    PLAN: PT FREQUENCY: 1-2x/week  PT DURATION: 3-4 weeks  PLANNED INTERVENTIONS: Therapeutic exercises, Therapeutic activity, Neuromuscular re-education, Balance training, Gait training, Patient/Family education, Self Care, Joint mobilization, Joint manipulation, Vestibular training, Canalith repositioning, Orthotic/Fit training, DME instructions, Dry Needling, Electrical stimulation, Spinal manipulation, Spinal mobilization, Cryotherapy, Moist heat, Taping, Traction, Ultrasound, Ionotophoresis 4mg /ml Dexamethasone, Manual therapy, and Re-evaluation.  PLAN FOR NEXT SESSION: Continue with thoracolumbar mobility. Manual techniques/DTM along R QL/iliocostalis lumborum. Dry needling at future visits prn. Postural re-education.    Venetia Endo, PT, DPT #[id number]  Venetia ONEIDA Endo, PT 10/07/2023, 12:54 PM

## 2023-10-09 ENCOUNTER — Ambulatory Visit: Admitting: Physical Therapy

## 2023-10-09 ENCOUNTER — Encounter: Payer: Self-pay | Admitting: Physical Therapy

## 2023-10-09 DIAGNOSIS — M5459 Other low back pain: Secondary | ICD-10-CM

## 2023-10-09 DIAGNOSIS — M6281 Muscle weakness (generalized): Secondary | ICD-10-CM

## 2023-10-09 DIAGNOSIS — M47816 Spondylosis without myelopathy or radiculopathy, lumbar region: Secondary | ICD-10-CM

## 2023-10-09 NOTE — Therapy (Signed)
 OUTPATIENT PHYSICAL THERAPY TREATMENT   Patient Name: Tyler Glover MRN: [id number] DOB:10-22-1960, 63 y.o., male Today's Date: 10/09/2023  END OF SESSION:  PT End of Session - 10/09/23 1720     Visit Number 12    Number of Visits 13    Date for PT Re-Evaluation 10/03/23    PT Start Time 1721    PT Stop Time 1759    PT Time Calculation (min) 38 min    Activity Tolerance Patient tolerated treatment well    Behavior During Therapy WFL for tasks assessed/performed           Past Medical History:  Diagnosis Date   Allergy    Arthritis    Rheumatoid   GERD (gastroesophageal reflux disease)    Hypertension    Past Surgical History:  Procedure Laterality Date   COLONOSCOPY     COLONOSCOPY WITH PROPOFOL  N/A 08/21/2019   Procedure: COLONOSCOPY WITH PROPOFOL ;  Surgeon: Jinny Carmine, MD;  Location: [facility];  Service: Endoscopy;  Laterality: N/A;  priority 4   ESOPHAGOGASTRODUODENOSCOPY (EGD) WITH PROPOFOL  N/A 08/21/2019   Procedure: ESOPHAGOGASTRODUODENOSCOPY (EGD) WITH PROPOFOL ;  Surgeon: Jinny Carmine, MD;  Location: [facility];  Service: Endoscopy;  Laterality: N/A;   POLYPECTOMY  08/21/2019   Procedure: POLYPECTOMY;  Surgeon: Jinny Carmine, MD;  Location: [facility];  Service: Endoscopy;;   SHOULDER SURGERY     TESTICLE SURGERY     age 31 or 63   TONSILLECTOMY     Patient Active Problem List   Diagnosis Date Noted   Encounter for screening colonoscopy    Polyp of transverse colon    Heartburn    Pure hypercholesterolemia 01/26/2016   Right lateral epicondylitis 07/07/2015   Vitamin D  deficiency 12/24/2014   Prediabetes 12/24/2014   Allergic rhinitis, seasonal 06/03/2014   Acid reflux 06/03/2014   Essential (primary) hypertension 06/03/2014   Family history of diabetes mellitus 06/03/2014   Family history of cardiac disorder 06/03/2014   Obesity (BMI 30.0-34.9) 06/03/2014    PCP: Joshua Cathryne BROCKS, MD (Inactive)  REFERRING PROVIDER:  Mayur Loree Blanch, MD, PharmD  REFERRING DIAG: [phone] Lumbar spondylosis, M54.9 Back pain  RATIONALE FOR EVALUATION AND TREATMENT: Rehabilitation  THERAPY DIAG: No diagnosis found.  ONSET DATE: May 2025 (worsening symptoms, acute on chronic flare-up)  FOLLOW-UP APPT SCHEDULED WITH REFERRING PROVIDER: None currently in EMR  PERTINENT HISTORY: Pt is a 63 year old male with Parkinson's disease who ambulates at community-level without AD. Pt has new referral for lumbar spondylosis/LBP. Pt is well known to this clinic and has been attending PT with focus on gait/balance/strengthening.   Patient is s/p ESI at R L4-L5/L5-S1 neuroforamen. Pt reports acute flare-up of back pain in May of this year with worsening pain and R-sided symptoms primarily. Pt reports attempting to care for it on his own. He reports dealing with it most of this year prior to that. He feels that injections have helped. He state she still has pain along R side. Pt reports pain in R flank/R lower lumbar paraspinal region. Pt reports previous episode of L-sided sciatica - not present now. He reports no current paresthesias/numbness. Patient reports intermittent disturbed sleep due to low back.   Previous episode of care focused on gait, balance, and LLE weakness within context of Parkinson's disease. Pt met goals for outcome measures. He would still like to work on improving LLE strength and foot clearance.   PAIN:    Pain Intensity: Present: 4-6/10, Best: 0/10, Worst: 9/10 Pain  location: R lower lumbar paraspinal/R flank Pain Quality: sharp  Radiating: No  Numbness/Tingling: No Focal Weakness: Yes; weakness in LLE due to neurological deficits; no R-sided weakness per pt.  Aggravating factors: leaning over countertop/desktop, bending down to pick up items, prolonged sitting > 1-2 hours Relieving factors: Double knee to chest, heat, ice, Salonpas patch, Tylenol  24-hour pain behavior: worse late in the day  How long can you  sit: painful after 1-2 hours History of prior back injury, pain, surgery, or therapy: Yes; Hx of episodic back pain managed with chiropractic and medical management/injections  Dominant hand: right Imaging: Yes ;  CLINICAL DATA:  Low back pain progressively worsening. Right thigh numbness   EXAM: MRI LUMBAR SPINE WITHOUT CONTRAST   TECHNIQUE: Multiplanar, multisequence MR imaging of the lumbar spine was performed. No intravenous contrast was administered.   COMPARISON:  None Available.   FINDINGS: Segmentation:  Standard.   Alignment:  2 mm retrolisthesis of L2 on L3.   Vertebrae: No acute fracture, evidence of discitis, or aggressive bone lesion.   Conus medullaris and cauda equina: Conus extends to the T12-L1 level. Conus and cauda equina appear normal.   Paraspinal and other soft tissues: No acute paraspinal abnormality.   Disc levels:   Disc spaces: Degenerative disease with disc height loss at L5-S1. Mild disc height loss at L1-2. Disc desiccation at L1-2, L2-3 and L3-4.   T11-12: Mild broad-based disc bulge. No foraminal or central canal stenosis.   T12-L1: No significant disc bulge. No neural foraminal stenosis. No central canal stenosis. Mild bilateral facet arthropathy.   L1-L2: Mild broad-based disc bulge. Mild bilateral facet arthropathy. No foraminal or central canal stenosis.   L2-L3: Broad-based disc bulge. Mild bilateral facet arthropathy. Bilateral lateral recess narrowing. Minimal spinal stenosis. No foraminal stenosis.   L3-L4: Mild broad-based disc bulge. Mild bilateral facet arthropathy. No foraminal or central canal stenosis.   L4-L5: No significant disc bulge. No neural foraminal stenosis. No central canal stenosis. Mild bilateral facet arthropathy.   L5-S1: Broad-based disc osteophyte complex with a focal prominent right far lateral disc osteophyte complex. No foraminal or central canal stenosis.   IMPRESSION: 1. Lumbar spine  spondylosis as described above. 2. No acute osseous injury of the lumbar spine.   Red flags: Negative for bowel/bladder changes, saddle paresthesia, personal history of cancer, h/o spinal tumors, h/o compression fx, h/o abdominal aneurysm, abdominal pain, chills/fever, night sweats, nausea, vomiting, unrelenting pain, first onset of insidious LBP <20 y/o  PRECAUTIONS: None  WEIGHT BEARING RESTRICTIONS: No  FALLS: Has patient fallen in last 6 months? No  Living Environment Lives with: lives with their spouse and mother  Lives in: House/apartment; one step to get into home; stairs to get into home, pt has upstairs bedroom. Pt reports doing fairly well with stairs. 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   Hobbies: Visiting museums, sight-seeing, travel  Patient Goals: Reduced pain, improved movement     OBJECTIVE (data from initial evaluation unless otherwise dated):   Patient Surveys  ODI = 10/50 = 20%  GAIT: Distance walked: 40 ft Assistive device utilized: None Level of assistance: SBA Comments: Forward-flexed posture, dec step length  Posture: Moderate thoracic kyphosis and forward head, slouched posture c Hx of Parkinson's Lumbar lordosis: WNL Iliac crest height: Equal bilaterally Lumbar  lateral shift: Negative  AROM AROM (Normal range in degrees) AROM  08/21/23 AROM 09/26/23  Lumbar    Flexion (65) 75%* 75% (down to ankles), dull pain R flank unchanged  Extension (30) 75%* 75%*  Right lateral flexion (25) 75% WNL  Left lateral flexion (25) 75% WNL (tightness R paraspinals)  Right rotation (30) 100% WNL  Left rotation (30) 50% WNL       Hip Right Left   Flexion (125) WNL WNL   Extension (15)     Abduction (40)     Adduction      Internal Rotation (45)      External Rotation (45)          (* = pain; Blank rows = not tested)  LE MMT: MMT (out of 5) Right 08/21/23 Left 08/21/23  Hip flexion 5 5  Hip extension    Hip abduction    Hip adduction    Hip internal rotation    Hip external rotation    Knee flexion 5 5  Knee extension 5 5-  Ankle dorsiflexion 4 4  Ankle plantarflexion    Ankle inversion    Ankle eversion    (* = pain; Blank rows = not tested)  Sensation Grossly intact to light touch throughout bilateral LEs as determined by testing dermatomes L2-S2. Proprioception, stereognosis, and hot/cold testing deferred on this date.  Reflexes R/L Knee Jerk (L3/4): 2+/2+  Ankle Jerk (S1/2): Unable to obtain  Clonus: Negative bilat   Muscle Length Hamstrings: R: Positive L: Positive [place] (quadriceps): R: Positive L: Positive  Palpation Location Right Left         Lumbar paraspinals 2   Quadratus Lumborum 1   Gluteus Maximus 1   Gluteus Medius 0   Deep hip external rotators 0   PSIS 0   Fortin's Area (SIJ) 0   Greater Trochanter    (Blank rows = not tested) Graded on 0-4 scale (0 = no pain, 1 = pain, 2 = pain with wincing/grimacing/flinching, 3 = pain with withdrawal, 4 = unwilling to allow palpation)  Special Tests Lumbar Radiculopathy and Discogenic: Centralization and Peripheralization (SN 92, -LR 0.12): Not examined.  Slump (SN 83, -LR 0.32): R: Negative L: Negative SLR (SN 92, -LR 0.29): R: Negative L:  Negative   Facet Joint: Extension-Rotation (SN 100, -LR 0.0): R: Negative L: Negative  Lumbar Foraminal Stenosis: Lumbar quadrant (SN 70): R: Negative L: Negative  Hip: FABER (SN 81): R: Negative (pain with release of FABER on R;  L: Negative   Passive Accessory Intervertebral Motion Pain at restriction L3-5 CPA, moderate hypomobility of lower lumbar spine.     TODAY'S TREATMENT: DATE: 10/09/2023    SUBJECTIVE STATEMENT:   Patient reports 0.5-2/10 NPRS. Patient reports ongoing disruption with  sleeping last 2 hours of night - he states that it has gotten better. Patient reports compliance with HEP. Pt reports notable pain around Sunday - felt this with breaking down cardboard boxes and cleaning up home.    AROM Lumbar flexion: WFL (to dorsum of bilat feet)* mild pain Lumbar extension: WFL* Lateral flexion: R WFL, L WFL Thoracolumbar rotation: R WFL, L WFL    Therapeutic Exercise - for improved soft tissue flexibility and extensibility as needed for ROM, improved strength as needed to improve performance of CKC activities/functional movements, MDT/repeated movement for symptom modulation   NuStep; Level 5, x 6 minutes - for improved soft tissue mobility and increased tissue temperature to improve muscle performance   -subjective  gathered during this time, treatment area prepared  Repeated extension in lying, 2 x 10: no effect during, better after   Lower trunk rotations, hooklying; 1 x 10 alt R/L Bridge; 2 x 10  PATIENT EDUCATION: Discussed current progress made, prognosis, POC    *not today* Open book; 1 x 10 R and L side Supine piriformis stretch; 2 x 30 sec  3 way kick, standing; x 10 ea dir, bilat  -Red Tband around distal shins  Lower trunk rotations; with QL bias; x 10 (LLE crossed to stretch R QL today), 3 sec hold Hooklying lumbar traction, 10-sec intermittent holds; x 6 minutes, PT at foot of treatment table    Manual Therapy - for symptom modulation, soft tissue sensitivity and mobility, joint mobility, ROM   STM and IASTM with Hypervolt along R longissimus/iliocostalis lumborum L3-S1 and R gluteal musculature; x 15 minutes CPA ; 2 x 30 sec bouts, gr III for joint mobility L3-L5  *not today* General manual lumbar traction in hooklying with Mulligan belt; therapist at foot of patient; 10 sec on, 10 sec off x 5 minutes for nerve root decompression, pain control    PATIENT EDUCATION:  Education details: see above for patient education details Person  educated: Patient Education method: Explanation, Demonstration, and Handouts Education comprehension: verbalized understanding and returned demonstration   HOME EXERCISE PROGRAM:  Access Code: [email or web address] URL: [email or web address] Date: 09/12/2023 Prepared by: Venetia Endo  Exercises - Prone Press Up  - 5-6 x daily - 7 x weekly - 1 sets - 10 reps - 1sec hold - Supine Quadratus Lumborum Stretch  - 2 x daily - 7 x weekly - 2 sets - 10 reps - 2-3sec hold - Seated Hamstring Stretch  - 2 x daily - 7 x weekly - 3 sets - 30sec hold - Supine Piriformis Stretch with Foot on Ground  - 2 x daily - 7 x weekly - 3 sets - 30sec hold   ASSESSMENT:  CLINICAL IMPRESSION: Patient has made excellent progress to date. He declines dry needling today and opts to consider this at future visit. Pt reports improving nocturnal symptoms, but he does still experience some disruption during the last 2 hours of sleep. He has largely improved thoracolumbar AROM and has tolerated work duties largely well, but pt still has remaining R flank pain.  Pt has current deficits in thoracolumbar AROM, decreased LLE strength, lumbar spine stiffness/hypomobility, posterior chain tightness/dec flexibility. Pt will continue to benefit from skilled PT services to address deficits and improve function.   OBJECTIVE IMPAIRMENTS: Abnormal gait, decreased mobility, decreased ROM, decreased strength, hypomobility, impaired flexibility, postural dysfunction, and pain.   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   REHAB POTENTIAL:  Good  CLINICAL DECISION MAKING: Evolving/moderate complexity  EVALUATION COMPLEXITY: Moderate   GOALS: Goals reviewed with patient? Yes  SHORT TERM GOALS: Target date: 09/12/2023  Pt will be independent with HEP in order to improve strength and decrease back pain to improve pain-free function at home and work. Baseline: 08/21/23: Baseline HEP initiated.     09/26/23: Pt mostly compliant with HEP, some missed days or decreased frequency with his schedule.  Goal status: IN PROGRESS   LONG TERM GOALS: Target date: 10/10/2023  Patient will have full  thoracolumbar AROM without reproduction of pain as needed for reaching items on ground, household chores, bending Baseline: 08/21/23: Motion loss and pain with flexion, motion loss with extension, bilateral lateral flexion, L rotation.    09/26/23: Motion loss and pain with flexion and extension. No notable motion loss or pain in frontal or transverse plane.  Goal status: IN PROGRESS  2.  Pt will decrease worst back pain by at least 2 points on the NPRS in order to demonstrate clinically significant reduction in back pain. Baseline: 08/21/23: 9/10    09/26/23: 8/10 at worst over previous week Goal status: IN PROGRESS  3.  Pt will decrease mODI score by at least 13 points in order demonstrate clinically significant reduction in back pain/disability.       Baseline: 08/21/23: 10/50 = 20%      09/26/23: 11/50 = 22% Goal status: NOT MET   4.  Pt will tolerate sitting for leisure activity and traveling up to 1 hour without reproduction of pain.  Baseline: 08/21/23: Pain with sitting > 1-2 hr.    09/26/23: Up to 2 hours.  Goal status: ACHIEVED    PLAN: PT FREQUENCY: 1-2x/week  PT DURATION: 3-4 weeks  PLANNED INTERVENTIONS: Therapeutic exercises, Therapeutic activity, Neuromuscular re-education, Balance training, Gait training, Patient/Family education, Self Care, Joint mobilization, Joint manipulation, Vestibular training, Canalith repositioning,  Orthotic/Fit training, DME instructions, Dry Needling, Electrical stimulation, Spinal manipulation, Spinal mobilization, Cryotherapy, Moist heat, Taping, Traction, Ultrasound, Ionotophoresis 4mg /ml Dexamethasone, Manual therapy, and Re-evaluation.  PLAN FOR NEXT SESSION: Continue with thoracolumbar mobility. Manual techniques/DTM along R QL/iliocostalis lumborum. Dry needling at future visits prn. Postural re-education.    Venetia Endo, PT, DPT #[id number]  Venetia ONEIDA Endo, PT 10/09/2023, 5:21 PM

## 2023-10-16 ENCOUNTER — Ambulatory Visit: Attending: Rheumatology | Admitting: Physical Therapy

## 2023-10-16 ENCOUNTER — Encounter: Payer: Self-pay | Admitting: Physical Therapy

## 2023-10-16 DIAGNOSIS — R262 Difficulty in walking, not elsewhere classified: Secondary | ICD-10-CM | POA: Insufficient documentation

## 2023-10-16 DIAGNOSIS — M5459 Other low back pain: Secondary | ICD-10-CM | POA: Diagnosis present

## 2023-10-16 DIAGNOSIS — M47816 Spondylosis without myelopathy or radiculopathy, lumbar region: Secondary | ICD-10-CM | POA: Insufficient documentation

## 2023-10-16 DIAGNOSIS — M6281 Muscle weakness (generalized): Secondary | ICD-10-CM | POA: Insufficient documentation

## 2023-10-16 NOTE — Therapy (Unsigned)
 OUTPATIENT PHYSICAL THERAPY TREATMENT   Patient Name: Tyler Glover MRN: [id number] DOB:Aug 22, 1960, 63 y.o., male Today's Date: 10/16/2023  END OF SESSION:  PT End of Session - 10/16/23 1717     Visit Number 13    Number of Visits 18    Date for PT Re-Evaluation 10/03/23    PT Start Time 1718    PT Stop Time 1757    PT Time Calculation (min) 39 min    Activity Tolerance Patient tolerated treatment well    Behavior During Therapy WFL for tasks assessed/performed          Past Medical History:  Diagnosis Date   Allergy    Arthritis    Rheumatoid   GERD (gastroesophageal reflux disease)    Hypertension    Past Surgical History:  Procedure Laterality Date   COLONOSCOPY     COLONOSCOPY WITH PROPOFOL  N/A 08/21/2019   Procedure: COLONOSCOPY WITH PROPOFOL ;  Surgeon: Jinny Carmine, MD;  Location: [facility];  Service: Endoscopy;  Laterality: N/A;  priority 4   ESOPHAGOGASTRODUODENOSCOPY (EGD) WITH PROPOFOL  N/A 08/21/2019   Procedure: ESOPHAGOGASTRODUODENOSCOPY (EGD) WITH PROPOFOL ;  Surgeon: Jinny Carmine, MD;  Location: [facility];  Service: Endoscopy;  Laterality: N/A;   POLYPECTOMY  08/21/2019   Procedure: POLYPECTOMY;  Surgeon: Jinny Carmine, MD;  Location: [facility];  Service: Endoscopy;;   SHOULDER SURGERY     TESTICLE SURGERY     age 75 or 64   TONSILLECTOMY     Patient Active Problem List   Diagnosis Date Noted   Encounter for screening colonoscopy    Polyp of transverse colon    Heartburn    Pure hypercholesterolemia 01/26/2016   Right lateral epicondylitis 07/07/2015   Vitamin D  deficiency 12/24/2014   Prediabetes 12/24/2014   Allergic rhinitis, seasonal 06/03/2014   Acid reflux 06/03/2014   Essential (primary) hypertension 06/03/2014   Family history of diabetes mellitus 06/03/2014   Family history of cardiac disorder 06/03/2014   Obesity (BMI 30.0-34.9) 06/03/2014    PCP: Joshua Cathryne BROCKS, MD (Inactive)  REFERRING PROVIDER: Mayur  Loree Blanch, MD, PharmD  REFERRING DIAG: [phone] Lumbar spondylosis, M54.9 Back pain  RATIONALE FOR EVALUATION AND TREATMENT: Rehabilitation  THERAPY DIAG: Other low back pain  Lumbar spondylosis  Muscle weakness (generalized)  ONSET DATE: May 2025 (worsening symptoms, acute on chronic flare-up)  FOLLOW-UP APPT SCHEDULED WITH REFERRING PROVIDER: None currently in EMR  PERTINENT HISTORY: Pt is a 64 year old male with Parkinson's disease who ambulates at community-level without AD. Pt has new referral for lumbar spondylosis/LBP. Pt is well known to this clinic and has been attending PT with focus on gait/balance/strengthening.   Patient is s/p ESI at R L4-L5/L5-S1 neuroforamen. Pt reports acute flare-up of back pain in May of this year with worsening pain and R-sided symptoms primarily. Pt reports attempting to care for it on his own. He reports dealing with it most of this year prior to that. He feels that injections have helped. He state she still has pain along R side. Pt reports pain in R flank/R lower lumbar paraspinal region. Pt reports previous episode of L-sided sciatica - not present now. He reports no current paresthesias/numbness. Patient reports intermittent disturbed sleep due to low back.   Previous episode of care focused on gait, balance, and LLE weakness within context of Parkinson's disease. Pt met goals for outcome measures. He would still like to work on improving LLE strength and foot clearance.   PAIN:    Pain Intensity:  Present: 4-6/10, Best: 0/10, Worst: 9/10 Pain location: R lower lumbar paraspinal/R flank Pain Quality: sharp  Radiating: No  Numbness/Tingling: No Focal Weakness: Yes; weakness in LLE due to neurological deficits; no R-sided weakness per pt.  Aggravating factors: leaning over countertop/desktop, bending down to pick up items, prolonged sitting > 1-2 hours Relieving factors: Double knee to chest, heat, ice, Salonpas patch, Tylenol  24-hour pain  behavior: worse late in the day  How long can you sit: painful after 1-2 hours History of prior back injury, pain, surgery, or therapy: Yes; Hx of episodic back pain managed with chiropractic and medical management/injections  Dominant hand: right Imaging: Yes ;  CLINICAL DATA:  Low back pain progressively worsening. Right thigh numbness   EXAM: MRI LUMBAR SPINE WITHOUT CONTRAST   TECHNIQUE: Multiplanar, multisequence MR imaging of the lumbar spine was performed. No intravenous contrast was administered.   COMPARISON:  None Available.   FINDINGS: Segmentation:  Standard.   Alignment:  2 mm retrolisthesis of L2 on L3.   Vertebrae: No acute fracture, evidence of discitis, or aggressive bone lesion.   Conus medullaris and cauda equina: Conus extends to the T12-L1 level. Conus and cauda equina appear normal.   Paraspinal and other soft tissues: No acute paraspinal abnormality.   Disc levels:   Disc spaces: Degenerative disease with disc height loss at L5-S1. Mild disc height loss at L1-2. Disc desiccation at L1-2, L2-3 and L3-4.   T11-12: Mild broad-based disc bulge. No foraminal or central canal stenosis.   T12-L1: No significant disc bulge. No neural foraminal stenosis. No central canal stenosis. Mild bilateral facet arthropathy.   L1-L2: Mild broad-based disc bulge. Mild bilateral facet arthropathy. No foraminal or central canal stenosis.   L2-L3: Broad-based disc bulge. Mild bilateral facet arthropathy. Bilateral lateral recess narrowing. Minimal spinal stenosis. No foraminal stenosis.   L3-L4: Mild broad-based disc bulge. Mild bilateral facet arthropathy. No foraminal or central canal stenosis.   L4-L5: No significant disc bulge. No neural foraminal stenosis. No central canal stenosis. Mild bilateral facet arthropathy.   L5-S1: Broad-based disc osteophyte complex with a focal prominent right far lateral disc osteophyte complex. No foraminal or central canal  stenosis.   IMPRESSION: 1. Lumbar spine spondylosis as described above. 2. No acute osseous injury of the lumbar spine.   Red flags: Negative for bowel/bladder changes, saddle paresthesia, personal history of cancer, h/o spinal tumors, h/o compression fx, h/o abdominal aneurysm, abdominal pain, chills/fever, night sweats, nausea, vomiting, unrelenting pain, first onset of insidious LBP <20 y/o  PRECAUTIONS: None  WEIGHT BEARING RESTRICTIONS: No  FALLS: Has patient fallen in last 6 months? No  Living Environment Lives with: lives with their spouse and mother  Lives in: House/apartment; one step to get into home; stairs to get into home, pt has upstairs bedroom. Pt reports doing fairly well with stairs. 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   Hobbies: Visiting museums, sight-seeing, travel  Patient Goals: Reduced pain, improved movement     OBJECTIVE (data from initial evaluation unless otherwise dated):   Patient Surveys  ODI = 10/50 = 20%  GAIT: Distance walked: 40 ft Assistive device utilized: None Level of assistance: SBA Comments: Forward-flexed posture, dec step length  Posture: Moderate thoracic kyphosis and forward head, slouched posture c Hx of Parkinson's Lumbar lordosis:  WNL Iliac crest height: Equal bilaterally Lumbar lateral shift: Negative  AROM AROM (Normal range in degrees) AROM  08/21/23 AROM 09/26/23  Lumbar    Flexion (65) 75%* 75% (down to ankles), dull pain R flank unchanged  Extension (30) 75%* 75%*  Right lateral flexion (25) 75% WNL  Left lateral flexion (25) 75% WNL (tightness R paraspinals)  Right rotation (30) 100% WNL  Left rotation (30) 50% WNL       Hip Right Left   Flexion (125) WNL WNL   Extension (15)     Abduction (40)      Adduction      Internal Rotation (45)     External Rotation (45)          (* = pain; Blank rows = not tested)  LE MMT: MMT (out of 5) Right 08/21/23 Left 08/21/23  Hip flexion 5 5  Hip extension    Hip abduction    Hip adduction    Hip internal rotation    Hip external rotation    Knee flexion 5 5  Knee extension 5 5-  Ankle dorsiflexion 4 4  Ankle plantarflexion    Ankle inversion    Ankle eversion    (* = pain; Blank rows = not tested)  Sensation Grossly intact to light touch throughout bilateral LEs as determined by testing dermatomes L2-S2. Proprioception, stereognosis, and hot/cold testing deferred on this date.  Reflexes R/L Knee Jerk (L3/4): 2+/2+  Ankle Jerk (S1/2): Unable to obtain  Clonus: Negative bilat   Muscle Length Hamstrings: R: Positive L: Positive [place] (quadriceps): R: Positive L: Positive  Palpation Location Right Left         Lumbar paraspinals 2   Quadratus Lumborum 1   Gluteus Maximus 1   Gluteus Medius 0   Deep hip external rotators 0   PSIS 0   Fortin's Area (SIJ) 0   Greater Trochanter    (Blank rows = not tested) Graded on 0-4 scale (0 = no pain, 1 = pain, 2 = pain with wincing/grimacing/flinching, 3 = pain with withdrawal, 4 = unwilling to allow palpation)  Special Tests Lumbar Radiculopathy and Discogenic: Centralization and Peripheralization (SN 92, -LR 0.12): Not examined.  Slump (SN 83, -LR 0.32): R: Negative L: Negative SLR (SN 92, -LR 0.29): R: Negative L:  Negative   Facet Joint: Extension-Rotation (SN 100, -LR 0.0): R: Negative L: Negative  Lumbar Foraminal Stenosis: Lumbar quadrant (SN 70): R: Negative L: Negative  Hip: FABER (SN 81): R: Negative (pain with release of FABER on R;  L: Negative   Passive Accessory Intervertebral Motion Pain at restriction L3-5 CPA, moderate hypomobility of lower lumbar spine.     TODAY'S TREATMENT: DATE: 10/16/2023   SUBJECTIVE STATEMENT:   Patient reports 3-4/10 NPRS at  arrival. Patient reports some soreness after last visit. Patient reports ongoing pain in axial lower lumbar region and off to R. Patient reports he is not feeling the best today due to poor sleep last night; pt had Bible study that ran late last night.    AROM Lumbar flexion: WFL (to dorsum of bilat feet)* mild pain Lumbar extension: WFL* Lateral flexion: R WFL, L WFL Thoracolumbar rotation: R WFL, L WFL    Therapeutic Exercise - for improved soft tissue flexibility and extensibility as needed for ROM, improved strength as needed to improve performance of CKC activities/functional movements, MDT/repeated movement for symptom modulation   NuStep; Level 5, x 5.5 minutes - for improved soft tissue mobility and  increased tissue temperature to improve muscle performance   -subjective gathered during this time, treatment area prepared  Lower trunk rotations, hooklying; 1 x 10 alt R/L, 3 sec hold  Hooklying pelvic tilt, anterior>posterior; 2 x 10  PATIENT EDUCATION: Discussed current progress made, prognosis, POC  *next visit* Bridge; 2 x 10  *not today* Repeated extension in lying, 2 x 10: no effect during, better after  Open book; 1 x 10 R and L side Supine piriformis stretch; 2 x 30 sec  3 way kick, standing; x 10 ea dir, bilat  -Red Tband around distal shins  Lower trunk rotations; with QL bias; x 10 (LLE crossed to stretch R QL today), 3 sec hold Hooklying lumbar traction, 10-sec intermittent holds; x 6 minutes, PT at foot of treatment table    Manual Therapy - for symptom modulation, soft tissue sensitivity and mobility, joint mobility, ROM   STM along R longissimus/iliocostalis lumborum L3-S1 and R gluteal musculature; x 23 minutes  *not today* CPA ; 2 x 30 sec bouts, gr III for joint mobility L3-L5 General manual lumbar traction in hooklying with Mulligan belt; therapist at foot of patient; 10 sec on, 10 sec off x 5 minutes for nerve root decompression, pain control      Trigger Point Dry Needling   Initial Treatment: Pt instructed on Dry Needling rational, procedures, and possible side effects. Pt instructed to expect mild to moderate muscle soreness later in the day and/or into the next day.  Pt instructed to continue prescribed HEP. Patient verbalized understanding of these instructions and education.    Patient Verbal Consent Given: Yes Education Handout Provided: Yes Muscles Treated: R iliocostalis lumborum x 2, R L5 multifidus x 1 with 0.30 x 70 mm single needle placements  Electrical Stimulation Performed: No Treatment Response/Outcome: Mild post-treatment soreness, remaining R flank pain at rest. No change in ROM baselines in-session.    PATIENT EDUCATION:  Education details: see above for patient education details Person educated: Patient Education method: Explanation, Demonstration, and Handouts Education comprehension: verbalized understanding and returned demonstration   HOME EXERCISE PROGRAM:  Access Code: [email or web address] URL: [email or web address] Date: 09/12/2023 Prepared by: Venetia Endo  Exercises - Prone Press Up  - 5-6 x daily - 7 x weekly - 1 sets - 10 reps - 1sec hold - Supine Quadratus Lumborum Stretch  - 2 x daily - 7 x weekly - 2 sets - 10 reps - 2-3sec hold - Seated Hamstring Stretch  - 2 x daily - 7 x weekly - 3 sets - 30sec hold - Supine Piriformis Stretch with Foot on Ground  - 2 x daily - 7 x weekly - 3 sets - 30sec hold   ASSESSMENT:  CLINICAL IMPRESSION: Patient has made excellent progress to date. He declines dry needling today and opts to consider this at future visit. Pt reports improving nocturnal symptoms, but he does still experience some disruption during the last 2 hours of sleep. He has largely improved thoracolumbar AROM and has tolerated work duties largely well, but pt still has remaining R flank pain.  Pt has current deficits in thoracolumbar AROM, decreased LLE strength, lumbar spine  stiffness/hypomobility, posterior chain tightness/dec flexibility. Pt will continue to benefit from skilled PT services to address deficits and improve function.   OBJECTIVE IMPAIRMENTS: Abnormal gait, decreased mobility, decreased ROM, decreased strength, hypomobility, impaired flexibility, postural dysfunction, and pain.   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   REHAB POTENTIAL: Good  CLINICAL DECISION MAKING: Evolving/moderate complexity  EVALUATION COMPLEXITY: Moderate   GOALS: Goals reviewed with patient? Yes  SHORT TERM GOALS: Target date: 09/12/2023  Pt will be independent with HEP in order to improve strength and decrease back pain to improve pain-free function at home and work. Baseline: 08/21/23: Baseline HEP initiated.     09/26/23: Pt mostly compliant with HEP, some missed days or decreased frequency with his schedule.  Goal status: IN PROGRESS   LONG TERM GOALS: Target date: 10/10/2023  Patient will have full thoracolumbar AROM without reproduction of pain as needed for reaching items on ground, household chores, bending Baseline: 08/21/23: Motion loss and pain with flexion, motion loss with extension, bilateral lateral flexion, L rotation.    09/26/23: Motion loss and pain with flexion and extension. No notable motion loss or pain in frontal or transverse plane.  Goal status: IN PROGRESS  2.  Pt will decrease worst back pain by at least 2 points on the NPRS in order to demonstrate clinically significant reduction in back pain. Baseline: 08/21/23: 9/10    09/26/23: 8/10 at worst over previous  week Goal status: IN PROGRESS  3.  Pt will decrease mODI score by at least 13 points in order demonstrate clinically significant reduction in back pain/disability.       Baseline: 08/21/23: 10/50 = 20%      09/26/23: 11/50 = 22% Goal status: NOT MET   4.  Pt will tolerate sitting for leisure activity and traveling up to 1 hour without reproduction of pain.  Baseline: 08/21/23: Pain with sitting > 1-2 hr.    09/26/23: Up to 2 hours.  Goal status: ACHIEVED    PLAN: PT FREQUENCY: 1-2x/week  PT DURATION: 3-4 weeks  PLANNED INTERVENTIONS: Therapeutic exercises, Therapeutic activity, Neuromuscular re-education, Balance training, Gait training, Patient/Family education, Self Care, Joint mobilization, Joint manipulation, Vestibular training, Canalith repositioning, Orthotic/Fit training, DME instructions, Dry Needling, Electrical stimulation, Spinal manipulation, Spinal mobilization, Cryotherapy, Moist heat, Taping, Traction, Ultrasound, Ionotophoresis 4mg /ml Dexamethasone, Manual therapy, and Re-evaluation.  PLAN FOR NEXT SESSION: Continue with thoracolumbar mobility. Manual techniques/DTM along R QL/iliocostalis lumborum. Dry needling at future visits prn. Postural re-education.    Venetia Endo, PT, DPT #[id number]  Venetia ONEIDA Endo, PT 10/16/2023, 5:18 PM

## 2023-10-21 ENCOUNTER — Ambulatory Visit: Admitting: Physical Therapy

## 2023-10-21 DIAGNOSIS — M5459 Other low back pain: Secondary | ICD-10-CM | POA: Diagnosis not present

## 2023-10-21 DIAGNOSIS — M6281 Muscle weakness (generalized): Secondary | ICD-10-CM

## 2023-10-21 DIAGNOSIS — M47816 Spondylosis without myelopathy or radiculopathy, lumbar region: Secondary | ICD-10-CM

## 2023-10-21 NOTE — Therapy (Unsigned)
 OUTPATIENT PHYSICAL THERAPY TREATMENT   Patient Name: Tyler Glover MRN: [id number] DOB:[birth year], 63 y.o., male Today's Date: 10/21/2023  END OF SESSION:  PT End of Session - 10/21/23 1720     Visit Number 14    Number of Visits 18    Date for PT Re-Evaluation 10/03/23    PT Start Time 1721    PT Stop Time 1759    PT Time Calculation (min) 38 min    Activity Tolerance Patient tolerated treatment well    Behavior During Therapy WFL for tasks assessed/performed          Past Medical History:  Diagnosis Date   Allergy    Arthritis    Rheumatoid   GERD (gastroesophageal reflux disease)    Hypertension    Past Surgical History:  Procedure Laterality Date   COLONOSCOPY     COLONOSCOPY WITH PROPOFOL  N/A 08/21/2019   Procedure: COLONOSCOPY WITH PROPOFOL ;  Surgeon: Jinny Carmine, MD;  Location: [facility];  Service: Endoscopy;  Laterality: N/A;  priority 4   ESOPHAGOGASTRODUODENOSCOPY (EGD) WITH PROPOFOL  N/A 08/21/2019   Procedure: ESOPHAGOGASTRODUODENOSCOPY (EGD) WITH PROPOFOL ;  Surgeon: Jinny Carmine, MD;  Location: [facility];  Service: Endoscopy;  Laterality: N/A;   POLYPECTOMY  08/21/2019   Procedure: POLYPECTOMY;  Surgeon: Jinny Carmine, MD;  Location: [facility];  Service: Endoscopy;;   SHOULDER SURGERY     TESTICLE SURGERY     age 59 or 77   TONSILLECTOMY     Patient Active Problem List   Diagnosis Date Noted   Encounter for screening colonoscopy    Polyp of transverse colon    Heartburn    Pure hypercholesterolemia 01/26/2016   Right lateral epicondylitis 07/07/2015   Vitamin D  deficiency 12/24/2014   Prediabetes 12/24/2014   Allergic rhinitis, seasonal 06/03/2014   Acid reflux 06/03/2014   Essential (primary) hypertension 06/03/2014   Family history of diabetes mellitus 06/03/2014   Family history of cardiac disorder 06/03/2014   Obesity (BMI 30.0-34.9) 06/03/2014    PCP: Joshua Cathryne BROCKS, MD (Inactive)  REFERRING PROVIDER: Mayur  Loree Blanch, MD, PharmD  REFERRING DIAG: [phone] Lumbar spondylosis, M54.9 Back pain  RATIONALE FOR EVALUATION AND TREATMENT: Rehabilitation  THERAPY DIAG: Other low back pain  Lumbar spondylosis  Muscle weakness (generalized)  ONSET DATE: May 2025 (worsening symptoms, acute on chronic flare-up)  FOLLOW-UP APPT SCHEDULED WITH REFERRING PROVIDER: None currently in EMR  PERTINENT HISTORY: Pt is a 63 year old male with Parkinson's disease who ambulates at community-level without AD. Pt has new referral for lumbar spondylosis/LBP. Pt is well known to this clinic and has been attending PT with focus on gait/balance/strengthening.   Patient is s/p ESI at R L4-L5/L5-S1 neuroforamen. Pt reports acute flare-up of back pain in May of this year with worsening pain and R-sided symptoms primarily. Pt reports attempting to care for it on his own. He reports dealing with it most of this year prior to that. He feels that injections have helped. He state she still has pain along R side. Pt reports pain in R flank/R lower lumbar paraspinal region. Pt reports previous episode of L-sided sciatica - not present now. He reports no current paresthesias/numbness. Patient reports intermittent disturbed sleep due to low back.   Previous episode of care focused on gait, balance, and LLE weakness within context of Parkinson's disease. Pt met goals for outcome measures. He would still like to work on improving LLE strength and foot clearance.   PAIN:    Pain Intensity:  Present: 4-6/10, Best: 0/10, Worst: 9/10 Pain location: R lower lumbar paraspinal/R flank Pain Quality: sharp  Radiating: No  Numbness/Tingling: No Focal Weakness: Yes; weakness in LLE due to neurological deficits; no R-sided weakness per pt.  Aggravating factors: leaning over countertop/desktop, bending down to pick up items, prolonged sitting > 1-2 hours Relieving factors: Double knee to chest, heat, ice, Salonpas patch, Tylenol  24-hour pain  behavior: worse late in the day  How long can you sit: painful after 1-2 hours History of prior back injury, pain, surgery, or therapy: Yes; Hx of episodic back pain managed with chiropractic and medical management/injections  Dominant hand: right Imaging: Yes ;  CLINICAL DATA:  Low back pain progressively worsening. Right thigh numbness   EXAM: MRI LUMBAR SPINE WITHOUT CONTRAST   TECHNIQUE: Multiplanar, multisequence MR imaging of the lumbar spine was performed. No intravenous contrast was administered.   COMPARISON:  None Available.   FINDINGS: Segmentation:  Standard.   Alignment:  2 mm retrolisthesis of L2 on L3.   Vertebrae: No acute fracture, evidence of discitis, or aggressive bone lesion.   Conus medullaris and cauda equina: Conus extends to the T12-L1 level. Conus and cauda equina appear normal.   Paraspinal and other soft tissues: No acute paraspinal abnormality.   Disc levels:   Disc spaces: Degenerative disease with disc height loss at L5-S1. Mild disc height loss at L1-2. Disc desiccation at L1-2, L2-3 and L3-4.   T11-12: Mild broad-based disc bulge. No foraminal or central canal stenosis.   T12-L1: No significant disc bulge. No neural foraminal stenosis. No central canal stenosis. Mild bilateral facet arthropathy.   L1-L2: Mild broad-based disc bulge. Mild bilateral facet arthropathy. No foraminal or central canal stenosis.   L2-L3: Broad-based disc bulge. Mild bilateral facet arthropathy. Bilateral lateral recess narrowing. Minimal spinal stenosis. No foraminal stenosis.   L3-L4: Mild broad-based disc bulge. Mild bilateral facet arthropathy. No foraminal or central canal stenosis.   L4-L5: No significant disc bulge. No neural foraminal stenosis. No central canal stenosis. Mild bilateral facet arthropathy.   L5-S1: Broad-based disc osteophyte complex with a focal prominent right far lateral disc osteophyte complex. No foraminal or central canal  stenosis.   IMPRESSION: 1. Lumbar spine spondylosis as described above. 2. No acute osseous injury of the lumbar spine.   Red flags: Negative for bowel/bladder changes, saddle paresthesia, personal history of cancer, h/o spinal tumors, h/o compression fx, h/o abdominal aneurysm, abdominal pain, chills/fever, night sweats, nausea, vomiting, unrelenting pain, first onset of insidious LBP <20 y/o  PRECAUTIONS: None  WEIGHT BEARING RESTRICTIONS: No  FALLS: Has patient fallen in last 6 months? No  Living Environment Lives with: lives with their spouse and mother  Lives in: House/apartment; one step to get into home; stairs to get into home, pt has upstairs bedroom. Pt reports doing fairly well with stairs. 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   Hobbies: Visiting museums, sight-seeing, travel  Patient Goals: Reduced pain, improved movement     OBJECTIVE (data from initial evaluation unless otherwise dated):   Patient Surveys  ODI = 10/50 = 20%  GAIT: Distance walked: 40 ft Assistive device utilized: None Level of assistance: SBA Comments: Forward-flexed posture, dec step length  Posture: Moderate thoracic kyphosis and forward head, slouched posture c Hx of Parkinson's Lumbar lordosis:  WNL Iliac crest height: Equal bilaterally Lumbar lateral shift: Negative  AROM AROM (Normal range in degrees) AROM  08/21/23 AROM 09/26/23  Lumbar    Flexion (65) 75%* 75% (down to ankles), dull pain R flank unchanged  Extension (30) 75%* 75%*  Right lateral flexion (25) 75% WNL  Left lateral flexion (25) 75% WNL (tightness R paraspinals)  Right rotation (30) 100% WNL  Left rotation (30) 50% WNL       Hip Right Left   Flexion (125) WNL WNL   Extension (15)     Abduction (40)      Adduction      Internal Rotation (45)     External Rotation (45)          (* = pain; Blank rows = not tested)  LE MMT: MMT (out of 5) Right 08/21/23 Left 08/21/23  Hip flexion 5 5  Hip extension    Hip abduction    Hip adduction    Hip internal rotation    Hip external rotation    Knee flexion 5 5  Knee extension 5 5-  Ankle dorsiflexion 4 4  Ankle plantarflexion    Ankle inversion    Ankle eversion    (* = pain; Blank rows = not tested)  Sensation Grossly intact to light touch throughout bilateral LEs as determined by testing dermatomes L2-S2. Proprioception, stereognosis, and hot/cold testing deferred on this date.  Reflexes R/L Knee Jerk (L3/4): 2+/2+  Ankle Jerk (S1/2): Unable to obtain  Clonus: Negative bilat   Muscle Length Hamstrings: R: Positive L: Positive E[place] (quadriceps): R: Positive L: Positive  Palpation Location Right Left         Lumbar paraspinals 2   Quadratus Lumborum 1   Gluteus Maximus 1   Gluteus Medius 0   Deep hip external rotators 0   PSIS 0   Fortin's Area (SIJ) 0   Greater Trochanter    (Blank rows = not tested) Graded on 0-4 scale (0 = no pain, 1 = pain, 2 = pain with wincing/grimacing/flinching, 3 = pain with withdrawal, 4 = unwilling to allow palpation)  Special Tests Lumbar Radiculopathy and Discogenic: Centralization and Peripheralization (SN 92, -LR 0.12): Not examined.  Slump (SN 83, -LR 0.32): R: Negative L: Negative SLR (SN 92, -LR 0.29): R: Negative L:  Negative   Facet Joint: Extension-Rotation (SN 100, -LR 0.0): R: Negative L: Negative  Lumbar Foraminal Stenosis: Lumbar quadrant (SN 70): R: Negative L: Negative  Hip: FABER (SN 81): R: Negative (pain with release of FABER on R;  L: Negative   Passive Accessory Intervertebral Motion Pain at restriction L3-5 CPA, moderate hypomobility of lower lumbar spine.     TODAY'S TREATMENT: DATE: 10/21/2023   SUBJECTIVE STATEMENT:   Patient reports pain has been pretty  low at arrival today - 2/10 today. 5-6/10 pain over the weekend. Pt is unsure of specific aggravating factor. Patient reports being sore about 2 days after DN last visit. Patient reports keeping up HEP as best as he can. Pt reports pain center to R along lower lumbar region.     Therapeutic Exercise - for improved soft tissue flexibility and extensibility as needed for ROM, improved strength as needed to improve performance of CKC activities/functional movements, MDT/repeated movement for symptom modulation   NuStep; Level 5, x 5 minutes - for improved soft tissue mobility and increased tissue temperature to improve muscle performance   -subjective gathered during this time, treatment area prepared  Lower trunk rotations, hooklying;  1 x 10 alt R/L, 3 sec hold   Bridge; 2 x 10  Dying bug; 1 x 10 alternating R/L  PATIENT EDUCATION: Discussed current progress made, prognosis, POC    *not today* Hooklying pelvic tilt, anterior>posterior; 2 x 10 Repeated extension in lying, 2 x 10: no effect during, better after  Open book; 1 x 10 R and L side Supine piriformis stretch; 2 x 30 sec  3 way kick, standing; x 10 ea dir, bilat  -Red Tband around distal shins  Lower trunk rotations; with QL bias; x 10 (LLE crossed to stretch R QL today), 3 sec hold Hooklying lumbar traction, 10-sec intermittent holds; x 6 minutes, PT at foot of treatment table    Manual Therapy - for symptom modulation, soft tissue sensitivity and mobility, joint mobility, ROM   STM/DTM along R longissimus/iliocostalis lumborum L3-S1 and R gluteal musculature; x 15 minutes CPA ; 2 x 30 sec bouts, gr III for joint mobility L3-L5   *not today* General manual lumbar traction in hooklying with Mulligan belt; therapist at foot of patient; 10 sec on, 10 sec off x 5 minutes for nerve root decompression, pain control     PATIENT EDUCATION:  Education details: see above for patient education details Person educated:  Patient Education method: Explanation, Demonstration, and Handouts Education comprehension: verbalized understanding and returned demonstration   HOME EXERCISE PROGRAM:  Access Code: [email or web address] URL: [email or web address] Date: 09/12/2023 Prepared by: Venetia Endo  Exercises - Prone Press Up  - 5-6 x daily - 7 x weekly - 1 sets - 10 reps - 1sec hold - Supine Quadratus Lumborum Stretch  - 2 x daily - 7 x weekly - 2 sets - 10 reps - 2-3sec hold - Seated Hamstring Stretch  - 2 x daily - 7 x weekly - 3 sets - 30sec hold - Supine Piriformis Stretch with Foot on Ground  - 2 x daily - 7 x weekly - 3 sets - 30sec hold   ASSESSMENT:  CLINICAL IMPRESSION: Patient fortunately has relatively low NPRS. He has ongoing sleep disturbance between 3 AM and 5 AM (his typical wake-up time is 5:00 AM). Pt feels that this has not been as intense, but he continues to experience disruption at this time. Pt exhibits vastly improved thoracolumbar AROM, and he tolerates modest progression of posterior chain and trunk strengthening work on table well. Pt has current deficits in thoracolumbar AROM, decreased LLE strength, lumbar spine stiffness/hypomobility, posterior chain tightness/dec flexibility. Pt will continue to benefit from skilled PT services to address deficits and improve function.   OBJECTIVE IMPAIRMENTS: Abnormal gait, decreased mobility, decreased ROM, decreased strength, hypomobility, impaired flexibility, postural dysfunction, and pain.   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   REHAB POTENTIAL:  Good  CLINICAL DECISION MAKING: Evolving/moderate complexity  EVALUATION COMPLEXITY: Moderate   GOALS: Goals reviewed with patient? Yes  SHORT TERM GOALS: Target date: 09/12/2023  Pt will be independent with HEP in order to improve strength and decrease back pain to improve pain-free function at home and work. Baseline: 08/21/23: Baseline HEP initiated.     09/26/23: Pt mostly compliant with HEP, some missed days or decreased frequency with his schedule.  Goal status: IN PROGRESS   LONG TERM GOALS: Target  date: 10/10/2023  Patient will have full thoracolumbar AROM without reproduction of pain as needed for reaching items on ground, household chores, bending Baseline: 08/21/23: Motion loss and pain with flexion, motion loss with extension, bilateral lateral flexion, L rotation.    09/26/23: Motion loss and pain with flexion and extension. No notable motion loss or pain in frontal or transverse plane.  Goal status: IN PROGRESS  2.  Pt will decrease worst back pain by at least 2 points on the NPRS in order to demonstrate clinically significant reduction in back pain. Baseline: 08/21/23: 9/10    09/26/23: 8/10 at worst over previous week Goal status: IN PROGRESS  3.  Pt will decrease mODI score by at least 13 points in order demonstrate clinically significant reduction in back pain/disability.       Baseline: 08/21/23: 10/50 = 20%      09/26/23: 11/50 = 22% Goal status: NOT MET   4.  Pt will tolerate sitting for leisure activity and traveling up to 1 hour without reproduction of pain.  Baseline: 08/21/23: Pain with sitting > 1-2 hr.    09/26/23: Up to 2 hours.  Goal status: ACHIEVED    PLAN: PT FREQUENCY: 1-2x/week  PT DURATION: 3-4 weeks  PLANNED INTERVENTIONS: Therapeutic exercises, Therapeutic activity, Neuromuscular re-education, Balance training, Gait training, Patient/Family education, Self Care, Joint mobilization, Joint manipulation, Vestibular training, Canalith repositioning,  Orthotic/Fit training, DME instructions, Dry Needling, Electrical stimulation, Spinal manipulation, Spinal mobilization, Cryotherapy, Moist heat, Taping, Traction, Ultrasound, Ionotophoresis 4mg /ml Dexamethasone, Manual therapy, and Re-evaluation.  PLAN FOR NEXT SESSION: Continue with thoracolumbar mobility. Manual techniques/DTM along R QL/iliocostalis lumborum. Dry needling at future visits prn. Postural re-education.    Venetia Endo, PT, DPT #[id number]  Venetia ONEIDA Endo, PT 10/21/2023, 5:21 PM

## 2023-10-22 ENCOUNTER — Telehealth: Payer: Self-pay

## 2023-10-22 NOTE — Telephone Encounter (Signed)
 Copied from CRM [id number]. Topic: Clinical - Request for Lab/Test Order >> Oct 22, 2023  4:27 PM Tinnie C wrote: Reason for CRM: Pt calling in to request DAT scan and a blood test for copper. He sayts this is for his Parkinson's. Pt last seen 06/25/23 wih Dr. Cathryne Molt for this problem. He has no preference for [facility] provider.

## 2023-10-22 NOTE — Telephone Encounter (Signed)
 Please call patient to schedule TOC with new doctor. I am not sure what a DAT scan is but he can discuss his concerns with his new doctor when he see's them.

## 2023-10-23 ENCOUNTER — Ambulatory Visit: Admitting: Physical Therapy

## 2023-10-23 ENCOUNTER — Encounter: Payer: Self-pay | Admitting: Physical Therapy

## 2023-10-23 DIAGNOSIS — M5459 Other low back pain: Secondary | ICD-10-CM | POA: Diagnosis not present

## 2023-10-23 DIAGNOSIS — M6281 Muscle weakness (generalized): Secondary | ICD-10-CM

## 2023-10-23 DIAGNOSIS — M47816 Spondylosis without myelopathy or radiculopathy, lumbar region: Secondary | ICD-10-CM

## 2023-10-23 NOTE — Therapy (Signed)
 OUTPATIENT PHYSICAL THERAPY TREATMENT   Patient Name: Tyler Glover MRN: [id number] DOB:09/05/1960, 63 y.o., male Today's Date: 10/23/2023  END OF SESSION:  PT End of Session - 10/23/23 1721     Visit Number 15    Number of Visits 18    Date for PT Re-Evaluation 10/03/23    PT Start Time 1719    PT Stop Time 1759    PT Time Calculation (min) 40 min    Activity Tolerance Patient tolerated treatment well    Behavior During Therapy WFL for tasks assessed/performed           Past Medical History:  Diagnosis Date   Allergy    Arthritis    Rheumatoid   GERD (gastroesophageal reflux disease)    Hypertension    Past Surgical History:  Procedure Laterality Date   COLONOSCOPY     COLONOSCOPY WITH PROPOFOL  N/A 08/21/2019   Procedure: COLONOSCOPY WITH PROPOFOL ;  Surgeon: Jinny Carmine, MD;  Location: [facility];  Service: Endoscopy;  Laterality: N/A;  priority 4   ESOPHAGOGASTRODUODENOSCOPY (EGD) WITH PROPOFOL  N/A 08/21/2019   Procedure: ESOPHAGOGASTRODUODENOSCOPY (EGD) WITH PROPOFOL ;  Surgeon: Jinny Carmine, MD;  Location: [facility];  Service: Endoscopy;  Laterality: N/A;   POLYPECTOMY  08/21/2019   Procedure: POLYPECTOMY;  Surgeon: Jinny Carmine, MD;  Location: [facility];  Service: Endoscopy;;   SHOULDER SURGERY     TESTICLE SURGERY     age 27 or 84   TONSILLECTOMY     Patient Active Problem List   Diagnosis Date Noted   Encounter for screening colonoscopy    Polyp of transverse colon    Heartburn    Pure hypercholesterolemia 01/26/2016   Right lateral epicondylitis 07/07/2015   Vitamin D  deficiency 12/24/2014   Prediabetes 12/24/2014   Allergic rhinitis, seasonal 06/03/2014   Acid reflux 06/03/2014   Essential (primary) hypertension 06/03/2014   Family history of diabetes mellitus 06/03/2014   Family history of cardiac disorder 06/03/2014   Obesity (BMI 30.0-34.9) 06/03/2014    PCP: Joshua Cathryne BROCKS, MD (Inactive)  REFERRING PROVIDER:  Mayur Loree Blanch, MD, PharmD  REFERRING DIAG: [phone] Lumbar spondylosis, M54.9 Back pain  RATIONALE FOR EVALUATION AND TREATMENT: Rehabilitation  THERAPY DIAG: Other low back pain  Lumbar spondylosis  Muscle weakness (generalized)  ONSET DATE: May 2025 (worsening symptoms, acute on chronic flare-up)  FOLLOW-UP APPT SCHEDULED WITH REFERRING PROVIDER: None currently in EMR  PERTINENT HISTORY: Pt is a 63 year old male with Parkinson's disease who ambulates at community-level without AD. Pt has new referral for lumbar spondylosis/LBP. Pt is well known to this clinic and has been attending PT with focus on gait/balance/strengthening.   Patient is s/p ESI at R L4-L5/L5-S1 neuroforamen. Pt reports acute flare-up of back pain in May of this year with worsening pain and R-sided symptoms primarily. Pt reports attempting to care for it on his own. He reports dealing with it most of this year prior to that. He feels that injections have helped. He state she still has pain along R side. Pt reports pain in R flank/R lower lumbar paraspinal region. Pt reports previous episode of L-sided sciatica - not present now. He reports no current paresthesias/numbness. Patient reports intermittent disturbed sleep due to low back.   Previous episode of care focused on gait, balance, and LLE weakness within context of Parkinson's disease. Pt met goals for outcome measures. He would still like to work on improving LLE strength and foot clearance.   PAIN:    Pain  Intensity: Present: 4-6/10, Best: 0/10, Worst: 9/10 Pain location: R lower lumbar paraspinal/R flank Pain Quality: sharp  Radiating: No  Numbness/Tingling: No Focal Weakness: Yes; weakness in LLE due to neurological deficits; no R-sided weakness per pt.  Aggravating factors: leaning over countertop/desktop, bending down to pick up items, prolonged sitting > 1-2 hours Relieving factors: Double knee to chest, heat, ice, Salonpas patch, Tylenol  24-hour  pain behavior: worse late in the day  How long can you sit: painful after 1-2 hours History of prior back injury, pain, surgery, or therapy: Yes; Hx of episodic back pain managed with chiropractic and medical management/injections  Dominant hand: right Imaging: Yes ;  CLINICAL DATA:  Low back pain progressively worsening. Right thigh numbness   EXAM: MRI LUMBAR SPINE WITHOUT CONTRAST   TECHNIQUE: Multiplanar, multisequence MR imaging of the lumbar spine was performed. No intravenous contrast was administered.   COMPARISON:  None Available.   FINDINGS: Segmentation:  Standard.   Alignment:  2 mm retrolisthesis of L2 on L3.   Vertebrae: No acute fracture, evidence of discitis, or aggressive bone lesion.   Conus medullaris and cauda equina: Conus extends to the T12-L1 level. Conus and cauda equina appear normal.   Paraspinal and other soft tissues: No acute paraspinal abnormality.   Disc levels:   Disc spaces: Degenerative disease with disc height loss at L5-S1. Mild disc height loss at L1-2. Disc desiccation at L1-2, L2-3 and L3-4.   T11-12: Mild broad-based disc bulge. No foraminal or central canal stenosis.   T12-L1: No significant disc bulge. No neural foraminal stenosis. No central canal stenosis. Mild bilateral facet arthropathy.   L1-L2: Mild broad-based disc bulge. Mild bilateral facet arthropathy. No foraminal or central canal stenosis.   L2-L3: Broad-based disc bulge. Mild bilateral facet arthropathy. Bilateral lateral recess narrowing. Minimal spinal stenosis. No foraminal stenosis.   L3-L4: Mild broad-based disc bulge. Mild bilateral facet arthropathy. No foraminal or central canal stenosis.   L4-L5: No significant disc bulge. No neural foraminal stenosis. No central canal stenosis. Mild bilateral facet arthropathy.   L5-S1: Broad-based disc osteophyte complex with a focal prominent right far lateral disc osteophyte complex. No foraminal or  central canal stenosis.   IMPRESSION: 1. Lumbar spine spondylosis as described above. 2. No acute osseous injury of the lumbar spine.   Red flags: Negative for bowel/bladder changes, saddle paresthesia, personal history of cancer, h/o spinal tumors, h/o compression fx, h/o abdominal aneurysm, abdominal pain, chills/fever, night sweats, nausea, vomiting, unrelenting pain, first onset of insidious LBP <20 y/o  PRECAUTIONS: None  WEIGHT BEARING RESTRICTIONS: No  FALLS: Has patient fallen in last 6 months? No  Living Environment Lives with: lives with their spouse and mother  Lives in: House/apartment; one step to get into home; stairs to get into home, pt has upstairs bedroom. Pt reports doing fairly well with stairs. 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   Hobbies: Visiting museums, sight-seeing, travel  Patient Goals: Reduced pain, improved movement     OBJECTIVE (data from initial evaluation unless otherwise dated):   Patient Surveys  ODI = 10/50 = 20%  GAIT: Distance walked: 40 ft Assistive device utilized: None Level of assistance: SBA Comments: Forward-flexed posture, dec step length  Posture: Moderate thoracic kyphosis and forward head, slouched posture c Hx of Parkinson's Lumbar  lordosis: WNL Iliac crest height: Equal bilaterally Lumbar lateral shift: Negative  AROM AROM (Normal range in degrees) AROM  08/21/23 AROM 09/26/23  Lumbar    Flexion (65) 75%* 75% (down to ankles), dull pain R flank unchanged  Extension (30) 75%* 75%*  Right lateral flexion (25) 75% WNL  Left lateral flexion (25) 75% WNL (tightness R paraspinals)  Right rotation (30) 100% WNL  Left rotation (30) 50% WNL       Hip Right Left   Flexion (125) WNL WNL   Extension (15)      Abduction (40)     Adduction      Internal Rotation (45)     External Rotation (45)          (* = pain; Blank rows = not tested)  LE MMT: MMT (out of 5) Right 08/21/23 Left 08/21/23  Hip flexion 5 5  Hip extension    Hip abduction    Hip adduction    Hip internal rotation    Hip external rotation    Knee flexion 5 5  Knee extension 5 5-  Ankle dorsiflexion 4 4  Ankle plantarflexion    Ankle inversion    Ankle eversion    (* = pain; Blank rows = not tested)  Sensation Grossly intact to light touch throughout bilateral LEs as determined by testing dermatomes L2-S2. Proprioception, stereognosis, and hot/cold testing deferred on this date.  Reflexes R/L Knee Jerk (L3/4): 2+/2+  Ankle Jerk (S1/2): Unable to obtain  Clonus: Negative bilat   Muscle Length Hamstrings: R: Positive L: Positive [place] (quadriceps): R: Positive L: Positive  Palpation Location Right Left         Lumbar paraspinals 2   Quadratus Lumborum 1   Gluteus Maximus 1   Gluteus Medius 0   Deep hip external rotators 0   PSIS 0   Fortin's Area (SIJ) 0   Greater Trochanter    (Blank rows = not tested) Graded on 0-4 scale (0 = no pain, 1 = pain, 2 = pain with wincing/grimacing/flinching, 3 = pain with withdrawal, 4 = unwilling to allow palpation)  Special Tests Lumbar Radiculopathy and Discogenic: Centralization and Peripheralization (SN 92, -LR 0.12): Not examined.  Slump (SN 83, -LR 0.32): R: Negative L: Negative SLR (SN 92, -LR 0.29): R: Negative L:  Negative   Facet Joint: Extension-Rotation (SN 100, -LR 0.0): R: Negative L: Negative  Lumbar Foraminal Stenosis: Lumbar quadrant (SN 70): R: Negative L: Negative  Hip: FABER (SN 81): R: Negative (pain with release of FABER on R;  L: Negative   Passive Accessory Intervertebral Motion Pain at restriction L3-5 CPA, moderate hypomobility of lower lumbar spine.     TODAY'S TREATMENT: DATE: 10/23/2023   SUBJECTIVE STATEMENT:   Patient reports  doing well after last visit and states he felt good yesterday. Patient reports having pain early in AM typically between 3-5 AM. Minimal pain at arrival this afternoon.     Therapeutic Exercise - for improved soft tissue flexibility and extensibility as needed for ROM, improved strength as needed to improve performance of CKC activities/functional movements, MDT/repeated movement for symptom modulation   NuStep; Level 5, x 5 minutes - for improved soft tissue mobility and increased tissue temperature to improve muscle performance   -subjective gathered during this time, treatment area prepared  Lower trunk rotations, hooklying; 1 x 10 alt R/L, 3 sec hold   Bridge; 2 x 10  Dying bug; 2 x 10 alternating R/L, starting from hooklying  PATIENT EDUCATION: Encouraged continued HEP and discussed ongoing POC.     *not today* Hooklying pelvic tilt, anterior>posterior; 2 x 10 Repeated extension in lying, 2 x 10: no effect during, better after  Open book; 1 x 10 R and L side Supine piriformis stretch; 2 x 30 sec  3 way kick, standing; x 10 ea dir, bilat  -Red Tband around distal shins  Lower trunk rotations; with QL bias; x 10 (LLE crossed to stretch R QL today), 3 sec hold Hooklying lumbar traction, 10-sec intermittent holds; x 6 minutes, PT at foot of treatment table    Manual Therapy - for symptom modulation, soft tissue sensitivity and mobility, joint mobility, ROM   STM/DTM along R longissimus/iliocostalis lumborum L3-S1 and R gluteal musculature; x 15 minutes CPA ; 2 x 30 sec bouts, gr III for joint mobility L3-L5   *not today* General manual lumbar traction in hooklying with Mulligan belt; therapist at foot of patient; 10 sec on, 10 sec off x 5 minutes for nerve root decompression, pain control     PATIENT EDUCATION:  Education details: see above for patient education details Person educated: Patient Education method: Explanation, Demonstration, and Handouts Education  comprehension: verbalized understanding and returned demonstration   HOME EXERCISE PROGRAM:  Access Code: [email or web address] URL: [email or web address] Date: 09/12/2023 Prepared by: Venetia Endo  Exercises - Prone Press Up  - 5-6 x daily - 7 x weekly - 1 sets - 10 reps - 1sec hold - Supine Quadratus Lumborum Stretch  - 2 x daily - 7 x weekly - 2 sets - 10 reps - 2-3sec hold - Seated Hamstring Stretch  - 2 x daily - 7 x weekly - 3 sets - 30sec hold - Supine Piriformis Stretch with Foot on Ground  - 2 x daily - 7 x weekly - 3 sets - 30sec hold   ASSESSMENT:  CLINICAL IMPRESSION: Patient is able to continue with low volume of posterior chain strengthening work in unloaded position without increase in pain. He fortunately has relatively mild NPRS at arrival over last 2 weeks, but he consistently has sleep disturbance in early hours of morning. Pt has generally tolerated work duties well recently, but he does need further improvement in sleep quality to prevent worsening of pain experience. Pt has current deficits in thoracolumbar AROM, decreased LLE strength, lumbar spine stiffness/hypomobility, posterior chain tightness/dec flexibility. Pt will continue to benefit from skilled PT services to address deficits and improve function.   OBJECTIVE IMPAIRMENTS: Abnormal gait, decreased mobility, decreased ROM, decreased strength, hypomobility, impaired flexibility, postural dysfunction, and pain.   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   REHAB POTENTIAL: Good  CLINICAL DECISION MAKING:  Evolving/moderate complexity  EVALUATION COMPLEXITY: Moderate   GOALS: Goals reviewed with patient? Yes  SHORT TERM GOALS: Target date: 09/12/2023  Pt will be independent with HEP in order to improve strength and decrease back pain to improve pain-free function at home and work. Baseline: 08/21/23: Baseline HEP initiated.     09/26/23: Pt mostly compliant with HEP, some missed days or decreased frequency with his schedule.  Goal status: IN PROGRESS   LONG TERM GOALS: Target date: 10/10/2023  Patient will have full thoracolumbar AROM without reproduction of pain as needed for reaching items  on ground, household chores, bending Baseline: 08/21/23: Motion loss and pain with flexion, motion loss with extension, bilateral lateral flexion, L rotation.    09/26/23: Motion loss and pain with flexion and extension. No notable motion loss or pain in frontal or transverse plane.  Goal status: IN PROGRESS  2.  Pt will decrease worst back pain by at least 2 points on the NPRS in order to demonstrate clinically significant reduction in back pain. Baseline: 08/21/23: 9/10    09/26/23: 8/10 at worst over previous week Goal status: IN PROGRESS  3.  Pt will decrease mODI score by at least 13 points in order demonstrate clinically significant reduction in back pain/disability.       Baseline: 08/21/23: 10/50 = 20%      09/26/23: 11/50 = 22% Goal status: NOT MET   4.  Pt will tolerate sitting for leisure activity and traveling up to 1 hour without reproduction of pain.  Baseline: 08/21/23: Pain with sitting > 1-2 hr.    09/26/23: Up to 2 hours.  Goal status: ACHIEVED    PLAN: PT FREQUENCY: 1-2x/week  PT DURATION: 3-4 weeks  PLANNED INTERVENTIONS: Therapeutic exercises, Therapeutic activity, Neuromuscular re-education, Balance training, Gait training, Patient/Family education, Self Care, Joint mobilization, Joint manipulation, Vestibular training, Canalith repositioning, Orthotic/Fit training, DME instructions,  Dry Needling, Electrical stimulation, Spinal manipulation, Spinal mobilization, Cryotherapy, Moist heat, Taping, Traction, Ultrasound, Ionotophoresis 4mg /ml Dexamethasone, Manual therapy, and Re-evaluation.  PLAN FOR NEXT SESSION: Continue with thoracolumbar mobility. Manual techniques/DTM along R QL/iliocostalis lumborum. Dry needling at future visits prn. Postural re-education.    Venetia Endo, PT, DPT #[id number]  Venetia ONEIDA Endo, PT 10/23/2023, 5:59 PM

## 2023-10-28 ENCOUNTER — Ambulatory Visit: Admitting: Physical Therapy

## 2023-10-28 ENCOUNTER — Encounter: Payer: Self-pay | Admitting: Physical Therapy

## 2023-10-28 DIAGNOSIS — M47816 Spondylosis without myelopathy or radiculopathy, lumbar region: Secondary | ICD-10-CM

## 2023-10-28 DIAGNOSIS — M6281 Muscle weakness (generalized): Secondary | ICD-10-CM

## 2023-10-28 DIAGNOSIS — M5459 Other low back pain: Secondary | ICD-10-CM

## 2023-10-28 NOTE — Therapy (Unsigned)
 OUTPATIENT PHYSICAL THERAPY TREATMENT   Patient Name: Tyler Glover MRN: [id number] DOB:11-15-1960, 63 y.o., male Today's Date: 10/28/2023  END OF SESSION:  PT End of Session - 10/28/23 1717     Visit Number 16    Number of Visits 18    Date for PT Re-Evaluation 10/03/23    PT Start Time 1721    PT Stop Time 1800    PT Time Calculation (min) 39 min    Activity Tolerance Patient tolerated treatment well    Behavior During Therapy WFL for tasks assessed/performed          Past Medical History:  Diagnosis Date   Allergy    Arthritis    Rheumatoid   GERD (gastroesophageal reflux disease)    Hypertension    Past Surgical History:  Procedure Laterality Date   COLONOSCOPY     COLONOSCOPY WITH PROPOFOL  N/A 08/21/2019   Procedure: COLONOSCOPY WITH PROPOFOL ;  Surgeon: Jinny Carmine, MD;  Location: [facility];  Service: Endoscopy;  Laterality: N/A;  priority 4   ESOPHAGOGASTRODUODENOSCOPY (EGD) WITH PROPOFOL  N/A 08/21/2019   Procedure: ESOPHAGOGASTRODUODENOSCOPY (EGD) WITH PROPOFOL ;  Surgeon: Jinny Carmine, MD;  Location: [facility];  Service: Endoscopy;  Laterality: N/A;   POLYPECTOMY  08/21/2019   Procedure: POLYPECTOMY;  Surgeon: Jinny Carmine, MD;  Location: [facility];  Service: Endoscopy;;   SHOULDER SURGERY     TESTICLE SURGERY     age 58 or 65   TONSILLECTOMY     Patient Active Problem List   Diagnosis Date Noted   Encounter for screening colonoscopy    Polyp of transverse colon    Heartburn    Pure hypercholesterolemia 01/26/2016   Right lateral epicondylitis 07/07/2015   Vitamin D  deficiency 12/24/2014   Prediabetes 12/24/2014   Allergic rhinitis, seasonal 06/03/2014   Acid reflux 06/03/2014   Essential (primary) hypertension 06/03/2014   Family history of diabetes mellitus 06/03/2014   Family history of cardiac disorder 06/03/2014   Obesity (BMI 30.0-34.9) 06/03/2014    PCP: Joshua Cathryne BROCKS, MD (Inactive)  REFERRING PROVIDER: Mayur  Loree Blanch, MD, PharmD  REFERRING DIAG: [phone] Lumbar spondylosis, M54.9 Back pain  RATIONALE FOR EVALUATION AND TREATMENT: Rehabilitation  THERAPY DIAG: Other low back pain  Lumbar spondylosis  Muscle weakness (generalized)  ONSET DATE: May 2025 (worsening symptoms, acute on chronic flare-up)  FOLLOW-UP APPT SCHEDULED WITH REFERRING PROVIDER: None currently in EMR  PERTINENT HISTORY: Pt is a 63 year old male with Parkinson's disease who ambulates at community-level without AD. Pt has new referral for lumbar spondylosis/LBP. Pt is well known to this clinic and has been attending PT with focus on gait/balance/strengthening.   Patient is s/p ESI at R L4-L5/L5-S1 neuroforamen. Pt reports acute flare-up of back pain in May of this year with worsening pain and R-sided symptoms primarily. Pt reports attempting to care for it on his own. He reports dealing with it most of this year prior to that. He feels that injections have helped. He state she still has pain along R side. Pt reports pain in R flank/R lower lumbar paraspinal region. Pt reports previous episode of L-sided sciatica - not present now. He reports no current paresthesias/numbness. Patient reports intermittent disturbed sleep due to low back.   Previous episode of care focused on gait, balance, and LLE weakness within context of Parkinson's disease. Pt met goals for outcome measures. He would still like to work on improving LLE strength and foot clearance.   PAIN:    Pain Intensity:  Present: 4-6/10, Best: 0/10, Worst: 9/10 Pain location: R lower lumbar paraspinal/R flank Pain Quality: sharp  Radiating: No  Numbness/Tingling: No Focal Weakness: Yes; weakness in LLE due to neurological deficits; no R-sided weakness per pt.  Aggravating factors: leaning over countertop/desktop, bending down to pick up items, prolonged sitting > 1-2 hours Relieving factors: Double knee to chest, heat, ice, Salonpas patch, Tylenol  24-hour pain  behavior: worse late in the day  How long can you sit: painful after 1-2 hours History of prior back injury, pain, surgery, or therapy: Yes; Hx of episodic back pain managed with chiropractic and medical management/injections  Dominant hand: right Imaging: Yes ;  CLINICAL DATA:  Low back pain progressively worsening. Right thigh numbness   EXAM: MRI LUMBAR SPINE WITHOUT CONTRAST   TECHNIQUE: Multiplanar, multisequence MR imaging of the lumbar spine was performed. No intravenous contrast was administered.   COMPARISON:  None Available.   FINDINGS: Segmentation:  Standard.   Alignment:  2 mm retrolisthesis of L2 on L3.   Vertebrae: No acute fracture, evidence of discitis, or aggressive bone lesion.   Conus medullaris and cauda equina: Conus extends to the T12-L1 level. Conus and cauda equina appear normal.   Paraspinal and other soft tissues: No acute paraspinal abnormality.   Disc levels:   Disc spaces: Degenerative disease with disc height loss at L5-S1. Mild disc height loss at L1-2. Disc desiccation at L1-2, L2-3 and L3-4.   T11-12: Mild broad-based disc bulge. No foraminal or central canal stenosis.   T12-L1: No significant disc bulge. No neural foraminal stenosis. No central canal stenosis. Mild bilateral facet arthropathy.   L1-L2: Mild broad-based disc bulge. Mild bilateral facet arthropathy. No foraminal or central canal stenosis.   L2-L3: Broad-based disc bulge. Mild bilateral facet arthropathy. Bilateral lateral recess narrowing. Minimal spinal stenosis. No foraminal stenosis.   L3-L4: Mild broad-based disc bulge. Mild bilateral facet arthropathy. No foraminal or central canal stenosis.   L4-L5: No significant disc bulge. No neural foraminal stenosis. No central canal stenosis. Mild bilateral facet arthropathy.   L5-S1: Broad-based disc osteophyte complex with a focal prominent right far lateral disc osteophyte complex. No foraminal or central canal  stenosis.   IMPRESSION: 1. Lumbar spine spondylosis as described above. 2. No acute osseous injury of the lumbar spine.   Red flags: Negative for bowel/bladder changes, saddle paresthesia, personal history of cancer, h/o spinal tumors, h/o compression fx, h/o abdominal aneurysm, abdominal pain, chills/fever, night sweats, nausea, vomiting, unrelenting pain, first onset of insidious LBP <20 y/o  PRECAUTIONS: None  WEIGHT BEARING RESTRICTIONS: No  FALLS: Has patient fallen in last 6 months? No  Living Environment Lives with: lives with their spouse and mother  Lives in: House/apartment; one step to get into home; stairs to get into home, pt has upstairs bedroom. Pt reports doing fairly well with stairs. 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   Hobbies: Visiting museums, sight-seeing, travel  Patient Goals: Reduced pain, improved movement     OBJECTIVE (data from initial evaluation unless otherwise dated):   Patient Surveys  ODI = 10/50 = 20%  GAIT: Distance walked: 40 ft Assistive device utilized: None Level of assistance: SBA Comments: Forward-flexed posture, dec step length  Posture: Moderate thoracic kyphosis and forward head, slouched posture c Hx of Parkinson's Lumbar lordosis:  WNL Iliac crest height: Equal bilaterally Lumbar lateral shift: Negative  AROM AROM (Normal range in degrees) AROM  08/21/23 AROM 09/26/23  Lumbar    Flexion (65) 75%* 75% (down to ankles), dull pain R flank unchanged  Extension (30) 75%* 75%*  Right lateral flexion (25) 75% WNL  Left lateral flexion (25) 75% WNL (tightness R paraspinals)  Right rotation (30) 100% WNL  Left rotation (30) 50% WNL       Hip Right Left   Flexion (125) WNL WNL   Extension (15)     Abduction (40)      Adduction      Internal Rotation (45)     External Rotation (45)          (* = pain; Blank rows = not tested)  LE MMT: MMT (out of 5) Right 08/21/23 Left 08/21/23  Hip flexion 5 5  Hip extension    Hip abduction    Hip adduction    Hip internal rotation    Hip external rotation    Knee flexion 5 5  Knee extension 5 5-  Ankle dorsiflexion 4 4  Ankle plantarflexion    Ankle inversion    Ankle eversion    (* = pain; Blank rows = not tested)  Sensation Grossly intact to light touch throughout bilateral LEs as determined by testing dermatomes L2-S2. Proprioception, stereognosis, and hot/cold testing deferred on this date.  Reflexes R/L Knee Jerk (L3/4): 2+/2+  Ankle Jerk (S1/2): Unable to obtain  Clonus: Negative bilat   Muscle Length Hamstrings: R: Positive L: Positive E[place] (quadriceps): R: Positive L: Positive  Palpation Location Right Left         Lumbar paraspinals 2   Quadratus Lumborum 1   Gluteus Maximus 1   Gluteus Medius 0   Deep hip external rotators 0   PSIS 0   Fortin's Area (SIJ) 0   Greater Trochanter    (Blank rows = not tested) Graded on 0-4 scale (0 = no pain, 1 = pain, 2 = pain with wincing/grimacing/flinching, 3 = pain with withdrawal, 4 = unwilling to allow palpation)  Special Tests Lumbar Radiculopathy and Discogenic: Centralization and Peripheralization (SN 92, -LR 0.12): Not examined.  Slump (SN 83, -LR 0.32): R: Negative L: Negative SLR (SN 92, -LR 0.29): R: Negative L:  Negative   Facet Joint: Extension-Rotation (SN 100, -LR 0.0): R: Negative L: Negative  Lumbar Foraminal Stenosis: Lumbar quadrant (SN 70): R: Negative L: Negative  Hip: FABER (SN 81): R: Negative (pain with release of FABER on R;  L: Negative   Passive Accessory Intervertebral Motion Pain at restriction L3-5 CPA, moderate hypomobility of lower lumbar spine.      TODAY'S TREATMENT: DATE: 10/28/2023   SUBJECTIVE STATEMENT:   Patient reports lessened sleep  disturbance, though he still does get disturbed sleep early in AM. Patient reports 1-2/10 pain this afternoon. Patient reports he is usually doing okay with HEP, but he didn't do much this past weekend with family visiting for his birthday. He reports intermittent sharp pain with repeated extension; upon further questioning, he does not feel that this pain persists after pt completes sets of repeated extension.   Therapeutic Exercise - for improved soft tissue flexibility and extensibility as needed for ROM, improved strength as needed to improve performance of CKC activities/functional movements, MDT/repeated movement for symptom modulation   NuStep; Level 5, x 5 minutes - for improved soft tissue mobility and increased tissue temperature to improve muscle performance   -  subjective gathered during this time, treatment area prepared  Lower trunk rotations, hooklying; 1 x 10 alt R/L, 3 sec hold   LTR with Silver physioball; 1 x 15 alt R/L  Bridge; 2 x 10  Dying bug; 2 x 10 alternating R/L, starting from hooklying   PATIENT EDUCATION: Encouraged continued HEP and discussed ongoing POC. We discussed expectations with repeated movement and ensuring no worsening/peripheral symptoms after completion of repeated extension.     *not today* Hooklying pelvic tilt, anterior>posterior; 2 x 10 Repeated extension in lying, 2 x 10: no effect during, better after  Open book; 1 x 10 R and L side Supine piriformis stretch; 2 x 30 sec  3 way kick, standing; x 10 ea dir, bilat  -Red Tband around distal shins  Lower trunk rotations; with QL bias; x 10 (LLE crossed to stretch R QL today), 3 sec hold Hooklying lumbar traction, 10-sec intermittent holds; x 6 minutes, PT at foot of treatment table    Manual Therapy - for symptom modulation, soft tissue sensitivity and mobility, joint mobility, ROM   STM/DTM along R longissimus/iliocostalis lumborum L3-S1 and R gluteal musculature; x 15 minutes   *not  today* CPA ; 2 x 30 sec bouts, gr III for joint mobility L3-L5 General manual lumbar traction in hooklying with Mulligan belt; therapist at foot of patient; 10 sec on, 10 sec off x 5 minutes for nerve root decompression, pain control     PATIENT EDUCATION:  Education details: see above for patient education details Person educated: Patient Education method: Explanation, Demonstration, and Handouts Education comprehension: verbalized understanding and returned demonstration   HOME EXERCISE PROGRAM:  Access Code: [email or web address] URL: [email or web address] Date: 09/12/2023 Prepared by: Venetia Endo  Exercises - Prone Press Up  - 5-6 x daily - 7 x weekly - 1 sets - 10 reps - 1sec hold - Supine Quadratus Lumborum Stretch  - 2 x daily - 7 x weekly - 2 sets - 10 reps - 2-3sec hold - Seated Hamstring Stretch  - 2 x daily - 7 x weekly - 3 sets - 30sec hold - Supine Piriformis Stretch with Foot on Ground  - 2 x daily - 7 x weekly - 3 sets - 30sec hold   ASSESSMENT:  CLINICAL IMPRESSION: Patient feels that he has made good progress to date in spite of regular disturbances in early hours of morning (between 3 to 5 AM). He reports intermittent sharp sensation in back with completion of repeated extension; we discussed ensuring pain is not persistently worsening after movement is completed and ensuring no worsening peripheral symptoms or peripheralizing pain. Patient tolerates progression of lying posterior chain strengthening and core strengthening drills well today without notable increase in pain. Pt has current deficits in thoracolumbar AROM, decreased LLE strength, lumbar spine stiffness/hypomobility, posterior chain tightness/dec flexibility. Pt will continue to benefit from skilled PT services to address deficits and improve function.   OBJECTIVE IMPAIRMENTS: Abnormal gait, decreased mobility, decreased ROM, decreased strength, hypomobility, impaired flexibility, postural  dysfunction, and pain.   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   REHAB POTENTIAL: Good  CLINICAL DECISION MAKING: Evolving/moderate complexity  EVALUATION COMPLEXITY: Moderate   GOALS: Goals reviewed with patient? Yes  SHORT TERM GOALS: Target date: 09/12/2023  Pt will be independent with HEP in order to improve strength and decrease back pain to improve pain-free function at home and work. Baseline: 08/21/23: Baseline HEP initiated.     09/26/23: Pt mostly compliant with HEP, some missed days or decreased frequency with his schedule.  Goal status: IN PROGRESS   LONG TERM GOALS: Target date: 10/10/2023  Patient will have full thoracolumbar AROM without reproduction of pain as needed for reaching items on ground, household chores, bending Baseline: 08/21/23: Motion loss and pain with flexion, motion loss with extension, bilateral lateral flexion, L rotation.    09/26/23: Motion loss and pain with flexion and extension. No notable motion loss or pain in frontal or transverse plane.  Goal status: IN PROGRESS  2.  Pt will decrease worst back pain by at least 2 points on the NPRS in order to demonstrate clinically significant reduction in back pain. Baseline: 08/21/23: 9/10    09/26/23: 8/10 at worst over previous week Goal status: IN PROGRESS  3.  Pt will decrease mODI score by at least 13 points in order demonstrate clinically significant reduction in back pain/disability.       Baseline: 08/21/23: 10/50 = 20%      09/26/23: 11/50 = 22% Goal status: NOT MET   4.  Pt will tolerate sitting for leisure  activity and traveling up to 1 hour without reproduction of pain.  Baseline: 08/21/23: Pain with sitting > 1-2 hr.    09/26/23: Up to 2 hours.  Goal status: ACHIEVED    PLAN: PT FREQUENCY: 1-2x/week  PT DURATION: 3-4 weeks  PLANNED INTERVENTIONS: Therapeutic exercises, Therapeutic activity, Neuromuscular re-education, Balance training, Gait training, Patient/Family education, Self Care, Joint mobilization, Joint manipulation, Vestibular training, Canalith repositioning, Orthotic/Fit training, DME instructions, Dry Needling, Electrical stimulation, Spinal manipulation, Spinal mobilization, Cryotherapy, Moist heat, Taping, Traction, Ultrasound, Ionotophoresis 4mg /ml Dexamethasone, Manual therapy, and Re-evaluation.  PLAN FOR NEXT SESSION: Continue with thoracolumbar mobility. Manual techniques/DTM along R QL/iliocostalis lumborum. Dry needling at future visits prn. Postural re-education.    Venetia Endo, PT, DPT #[id number]  Venetia ONEIDA Endo, PT 10/28/2023, 5:24 PM

## 2023-10-30 ENCOUNTER — Encounter: Payer: Self-pay | Admitting: Physical Therapy

## 2023-10-30 ENCOUNTER — Ambulatory Visit: Admitting: Physical Therapy

## 2023-10-30 DIAGNOSIS — M6281 Muscle weakness (generalized): Secondary | ICD-10-CM

## 2023-10-30 DIAGNOSIS — M5459 Other low back pain: Secondary | ICD-10-CM | POA: Diagnosis not present

## 2023-10-30 DIAGNOSIS — M47816 Spondylosis without myelopathy or radiculopathy, lumbar region: Secondary | ICD-10-CM

## 2023-10-30 NOTE — Therapy (Signed)
 OUTPATIENT PHYSICAL THERAPY TREATMENT   Patient Name: Tyler Glover MRN: [id number] DOB:[birth year], 63 y.o., male Today's Date: 10/30/2023  END OF SESSION:  PT End of Session - 10/30/23 1719     Visit Number 17    Number of Visits 18    PT Start Time 1720    PT Stop Time 1759    PT Time Calculation (min) 39 min    Activity Tolerance Patient tolerated treatment well    Behavior During Therapy WFL for tasks assessed/performed          Past Medical History:  Diagnosis Date   Allergy    Arthritis    Rheumatoid   GERD (gastroesophageal reflux disease)    Hypertension    Past Surgical History:  Procedure Laterality Date   COLONOSCOPY     COLONOSCOPY WITH PROPOFOL  N/A 08/21/2019   Procedure: COLONOSCOPY WITH PROPOFOL ;  Surgeon: Jinny Carmine, MD;  Location: [facility];  Service: Endoscopy;  Laterality: N/A;  priority 4   ESOPHAGOGASTRODUODENOSCOPY (EGD) WITH PROPOFOL  N/A 08/21/2019   Procedure: ESOPHAGOGASTRODUODENOSCOPY (EGD) WITH PROPOFOL ;  Surgeon: Jinny Carmine, MD;  Location: [facility];  Service: Endoscopy;  Laterality: N/A;   POLYPECTOMY  08/21/2019   Procedure: POLYPECTOMY;  Surgeon: Jinny Carmine, MD;  Location: [facility];  Service: Endoscopy;;   SHOULDER SURGERY     TESTICLE SURGERY     age 27 or 5   TONSILLECTOMY     Patient Active Problem List   Diagnosis Date Noted   Encounter for screening colonoscopy    Polyp of transverse colon    Heartburn    Pure hypercholesterolemia 01/26/2016   Right lateral epicondylitis 07/07/2015   Vitamin D  deficiency 12/24/2014   Prediabetes 12/24/2014   Allergic rhinitis, seasonal 06/03/2014   Acid reflux 06/03/2014   Essential (primary) hypertension 06/03/2014   Family history of diabetes mellitus 06/03/2014   Family history of cardiac disorder 06/03/2014   Obesity (BMI 30.0-34.9) 06/03/2014    PCP: Joshua Cathryne BROCKS, MD (Inactive)  REFERRING PROVIDER: Mayur Loree Blanch, MD, PharmD  REFERRING  DIAG: [phone] Lumbar spondylosis, M54.9 Back pain  RATIONALE FOR EVALUATION AND TREATMENT: Rehabilitation  THERAPY DIAG: Other low back pain  Lumbar spondylosis  Muscle weakness (generalized)  ONSET DATE: May 2025 (worsening symptoms, acute on chronic flare-up)  FOLLOW-UP APPT SCHEDULED WITH REFERRING PROVIDER: None currently in EMR  PERTINENT HISTORY: Pt is a 63 year old male with Parkinson's disease who ambulates at community-level without AD. Pt has new referral for lumbar spondylosis/LBP. Pt is well known to this clinic and has been attending PT with focus on gait/balance/strengthening.   Patient is s/p ESI at R L4-L5/L5-S1 neuroforamen. Pt reports acute flare-up of back pain in May of this year with worsening pain and R-sided symptoms primarily. Pt reports attempting to care for it on his own. He reports dealing with it most of this year prior to that. He feels that injections have helped. He state she still has pain along R side. Pt reports pain in R flank/R lower lumbar paraspinal region. Pt reports previous episode of L-sided sciatica - not present now. He reports no current paresthesias/numbness. Patient reports intermittent disturbed sleep due to low back.   Previous episode of care focused on gait, balance, and LLE weakness within context of Parkinson's disease. Pt met goals for outcome measures. He would still like to work on improving LLE strength and foot clearance.   PAIN:    Pain Intensity: Present: 4-6/10, Best: 0/10, Worst: 9/10 Pain location:  R lower lumbar paraspinal/R flank Pain Quality: sharp  Radiating: No  Numbness/Tingling: No Focal Weakness: Yes; weakness in LLE due to neurological deficits; no R-sided weakness per pt.  Aggravating factors: leaning over countertop/desktop, bending down to pick up items, prolonged sitting > 1-2 hours Relieving factors: Double knee to chest, heat, ice, Salonpas patch, Tylenol  24-hour pain behavior: worse late in the day  How  long can you sit: painful after 1-2 hours History of prior back injury, pain, surgery, or therapy: Yes; Hx of episodic back pain managed with chiropractic and medical management/injections  Dominant hand: right Imaging: Yes ;  CLINICAL DATA:  Low back pain progressively worsening. Right thigh numbness   EXAM: MRI LUMBAR SPINE WITHOUT CONTRAST   TECHNIQUE: Multiplanar, multisequence MR imaging of the lumbar spine was performed. No intravenous contrast was administered.   COMPARISON:  None Available.   FINDINGS: Segmentation:  Standard.   Alignment:  2 mm retrolisthesis of L2 on L3.   Vertebrae: No acute fracture, evidence of discitis, or aggressive bone lesion.   Conus medullaris and cauda equina: Conus extends to the T12-L1 level. Conus and cauda equina appear normal.   Paraspinal and other soft tissues: No acute paraspinal abnormality.   Disc levels:   Disc spaces: Degenerative disease with disc height loss at L5-S1. Mild disc height loss at L1-2. Disc desiccation at L1-2, L2-3 and L3-4.   T11-12: Mild broad-based disc bulge. No foraminal or central canal stenosis.   T12-L1: No significant disc bulge. No neural foraminal stenosis. No central canal stenosis. Mild bilateral facet arthropathy.   L1-L2: Mild broad-based disc bulge. Mild bilateral facet arthropathy. No foraminal or central canal stenosis.   L2-L3: Broad-based disc bulge. Mild bilateral facet arthropathy. Bilateral lateral recess narrowing. Minimal spinal stenosis. No foraminal stenosis.   L3-L4: Mild broad-based disc bulge. Mild bilateral facet arthropathy. No foraminal or central canal stenosis.   L4-L5: No significant disc bulge. No neural foraminal stenosis. No central canal stenosis. Mild bilateral facet arthropathy.   L5-S1: Broad-based disc osteophyte complex with a focal prominent right far lateral disc osteophyte complex. No foraminal or central canal stenosis.   IMPRESSION: 1. Lumbar  spine spondylosis as described above. 2. No acute osseous injury of the lumbar spine.   Red flags: Negative for bowel/bladder changes, saddle paresthesia, personal history of cancer, h/o spinal tumors, h/o compression fx, h/o abdominal aneurysm, abdominal pain, chills/fever, night sweats, nausea, vomiting, unrelenting pain, first onset of insidious LBP <20 y/o  PRECAUTIONS: None  WEIGHT BEARING RESTRICTIONS: No  FALLS: Has patient fallen in last 6 months? No  Living Environment Lives with: lives with their spouse and mother  Lives in: House/apartment; one step to get into home; stairs to get into home, pt has upstairs bedroom. Pt reports doing fairly well with stairs. 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   Hobbies: Visiting museums, sight-seeing, travel  Patient Goals: Reduced pain, improved movement     OBJECTIVE (data from initial evaluation unless otherwise dated):   Patient Surveys  ODI = 10/50 = 20%  GAIT: Distance walked: 40 ft Assistive device utilized: None Level of assistance: SBA Comments: Forward-flexed posture, dec step length  Posture: Moderate thoracic kyphosis and forward head, slouched posture c Hx of Parkinson's Lumbar lordosis: WNL Iliac crest height: Equal bilaterally Lumbar lateral  shift: Negative  AROM AROM (Normal range in degrees) AROM  08/21/23 AROM 09/26/23  Lumbar    Flexion (65) 75%* 75% (down to ankles), dull pain R flank unchanged  Extension (30) 75%* 75%*  Right lateral flexion (25) 75% WNL  Left lateral flexion (25) 75% WNL (tightness R paraspinals)  Right rotation (30) 100% WNL  Left rotation (30) 50% WNL       Hip Right Left   Flexion (125) WNL WNL   Extension (15)     Abduction (40)     Adduction      Internal  Rotation (45)     External Rotation (45)          (* = pain; Blank rows = not tested)  LE MMT: MMT (out of 5) Right 08/21/23 Left 08/21/23  Hip flexion 5 5  Hip extension    Hip abduction    Hip adduction    Hip internal rotation    Hip external rotation    Knee flexion 5 5  Knee extension 5 5-  Ankle dorsiflexion 4 4  Ankle plantarflexion    Ankle inversion    Ankle eversion    (* = pain; Blank rows = not tested)  Sensation Grossly intact to light touch throughout bilateral LEs as determined by testing dermatomes L2-S2. Proprioception, stereognosis, and hot/cold testing deferred on this date.  Reflexes R/L Knee Jerk (L3/4): 2+/2+  Ankle Jerk (S1/2): Unable to obtain  Clonus: Negative bilat   Muscle Length Hamstrings: R: Positive L: Positive Ely (quadriceps): R: Positive L: Positive  Palpation Location Right Left         Lumbar paraspinals 2   Quadratus Lumborum 1   Gluteus Maximus 1   Gluteus Medius 0   Deep hip external rotators 0   PSIS 0   Fortin's Area (SIJ) 0   Greater Trochanter    (Blank rows = not tested) Graded on 0-4 scale (0 = no pain, 1 = pain, 2 = pain with wincing/grimacing/flinching, 3 = pain with withdrawal, 4 = unwilling to allow palpation)  Special Tests Lumbar Radiculopathy and Discogenic: Centralization and Peripheralization (SN 92, -LR 0.12): Not examined.  Slump (SN 83, -LR 0.32): R: Negative L: Negative SLR (SN 92, -LR 0.29): R: Negative L:  Negative   Facet Joint: Extension-Rotation (SN 100, -LR 0.0): R: Negative L: Negative  Lumbar Foraminal Stenosis: Lumbar quadrant (SN 70): R: Negative L: Negative  Hip: FABER (SN 81): R: Negative (pain with release of FABER on R;  L: Negative   Passive Accessory Intervertebral Motion Pain at restriction L3-5 CPA, moderate hypomobility of lower lumbar spine.      TODAY'S TREATMENT: DATE: 10/30/2023   SUBJECTIVE STATEMENT:   Patient reports 3/10 pain this afternoon affecting R paraspinal  region and somewhat into R hip. He reports some pain with prolonged standing over the previous 2 days. 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    Therapeutic Exercise - for improved soft tissue flexibility and extensibility as needed for ROM, improved strength as needed to improve performance of CKC activities/functional movements, MDT/repeated movement for symptom modulation   NuStep; Level 5, x 5 minutes - for improved soft tissue mobility and increased tissue temperature to improve muscle performance   -subjective gathered during this time, treatment area prepared  Open book; 1 x 10 R and L side LTR with Silver physioball; 1 x 15 alt R/L   PATIENT EDUCATION: Encouraged continued  HEP and discussed ongoing POC.    *next visit* Bridge; 2 x 10 Dying bug; 2 x 10 alternating R/L, starting from hooklying   *not today* Lower trunk rotations, hooklying; 1 x 10 alt R/L, 3 sec hold  Hooklying pelvic tilt, anterior>posterior; 2 x 10 Repeated extension in lying, 2 x 10: no effect during, better after  Supine piriformis stretch; 2 x 30 sec  3 way kick, standing; x 10 ea dir, bilat  -Red Tband around distal shins  Lower trunk rotations; with QL bias; x 10 (LLE crossed to stretch R QL today), 3 sec hold Hooklying lumbar traction, 10-sec intermittent holds; x 6 minutes, PT at foot of treatment table    Manual Therapy - for symptom modulation, soft tissue sensitivity and mobility, joint mobility, ROM   STM/DTM and IASTM along R longissimus/iliocostalis lumborum L3-S1 and R gluteal musculature; x 23 minutes Generalized thoracic and rib mobilization in prone lying, gr II; T7-T12  *not today* CPA ; 2 x 30 sec bouts, gr III for joint mobility L3-L5 General manual lumbar traction in hooklying with Mulligan belt; therapist at foot of patient; 10 sec on, 10 sec off x 5 minutes for nerve root decompression, pain control     PATIENT  EDUCATION:  Education details: see above for patient education details Person educated: Patient Education method: Explanation, Demonstration, and Handouts Education comprehension: verbalized understanding and returned demonstration   HOME EXERCISE PROGRAM:  Access Code: [email or web address] URL: [email or web address] Date: 09/12/2023 Prepared by: Venetia Endo  Exercises - Prone Press Up  - 5-6 x daily - 7 x weekly - 1 sets - 10 reps - 1sec hold - Supine Quadratus Lumborum Stretch  - 2 x daily - 7 x weekly - 2 sets - 10 reps - 2-3sec hold - Seated Hamstring Stretch  - 2 x daily - 7 x weekly - 3 sets - 30sec hold - Supine Piriformis Stretch with Foot on Ground  - 2 x daily - 7 x weekly - 3 sets - 30sec hold   ASSESSMENT:  CLINICAL IMPRESSION: Patient has moderately higher NPRS at baseline today due to 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 He spent ample time bend over fenders of vehicles and reports pain in typical R flank area and along thoracic spine as well. We spent more time on manual therapy and focus on symptom modulation versus active exercise; we will plan to continue with progressive exercise and extension-bias drills next visit pending resolution of flare-up from this week.  Pt has current deficits in thoracolumbar AROM, decreased LLE strength, lumbar spine stiffness/hypomobility, posterior chain tightness/dec flexibility. Pt will continue to benefit from skilled PT services to address deficits and improve function.   OBJECTIVE IMPAIRMENTS: Abnormal gait, decreased mobility, decreased ROM, decreased strength, hypomobility, impaired flexibility, postural dysfunction, and pain.   ACTIVITY LIMITATIONS: lifting, bending, sitting, squatting, sleeping, and transfers  PARTICIPATION LIMITATIONS: cleaning, laundry, driving, shopping, community activity, and occupation (teaches Research scientist (medical) at [employer])  PERSONAL FACTORS:  Past/current experiences, Time since onset of injury/illness/exacerbation, and 3+ comorbidities: (HTN, GERD, Parkinson's disease, OA) are also affecting patient's functional outcome.   REHAB POTENTIAL: Good  CLINICAL DECISION MAKING: Evolving/moderate complexity  EVALUATION COMPLEXITY: Moderate   GOALS: Goals reviewed with patient? Yes  SHORT TERM GOALS: Target date: 09/12/2023  Pt will be independent with HEP in order to improve strength and decrease back pain to improve pain-free function at home and work. Baseline: 08/21/23: Baseline HEP initiated.  09/26/23: Pt mostly compliant with HEP, some missed days or decreased frequency with his schedule.  Goal status: IN PROGRESS   LONG TERM GOALS: Target date: 10/10/2023  Patient will have full thoracolumbar AROM without reproduction of pain as needed for reaching items on ground, household chores, bending Baseline: 08/21/23: Motion loss and pain with flexion, motion loss with extension, bilateral lateral flexion, L rotation.    09/26/23: Motion loss and pain with flexion and extension. No notable motion loss or pain in frontal or transverse plane.  Goal status: IN PROGRESS  2.  Pt will decrease worst back pain by at least 2 points on the NPRS in order to demonstrate clinically significant reduction in back pain. Baseline: 08/21/23: 9/10    09/26/23: 8/10 at worst over previous week Goal status: IN PROGRESS  3.  Pt will decrease mODI score by at least 13 points in order demonstrate clinically significant reduction in back pain/disability.       Baseline: 08/21/23: 10/50 = 20%      09/26/23: 11/50 = 22% Goal status: NOT MET   4.  Pt will tolerate sitting for leisure activity and traveling up to 1 hour without reproduction of pain.  Baseline: 08/21/23: Pain with sitting > 1-2 hr.    09/26/23: Up to 2 hours.  Goal status: ACHIEVED    PLAN: PT FREQUENCY: 1-2x/week  PT DURATION: 3-4 weeks  PLANNED INTERVENTIONS: Therapeutic exercises,  Therapeutic activity, Neuromuscular re-education, Balance training, Gait training, Patient/Family education, Self Care, Joint mobilization, Joint manipulation, Vestibular training, Canalith repositioning, Orthotic/Fit training, DME instructions, Dry Needling, Electrical stimulation, Spinal manipulation, Spinal mobilization, Cryotherapy, Moist heat, Taping, Traction, Ultrasound, Ionotophoresis 4mg /ml Dexamethasone, Manual therapy, and Re-evaluation.  PLAN FOR NEXT SESSION: Continue with thoracolumbar mobility. Manual techniques/DTM along R QL/iliocostalis lumborum. Dry needling at future visits prn. Postural re-education.    Venetia Endo, PT, DPT #[id number]  Venetia ONEIDA Endo, PT 10/30/2023, 5:20 PM

## 2023-11-04 ENCOUNTER — Ambulatory Visit: Admitting: Physical Therapy

## 2023-11-04 DIAGNOSIS — M5459 Other low back pain: Secondary | ICD-10-CM

## 2023-11-04 DIAGNOSIS — M6281 Muscle weakness (generalized): Secondary | ICD-10-CM

## 2023-11-04 DIAGNOSIS — M47816 Spondylosis without myelopathy or radiculopathy, lumbar region: Secondary | ICD-10-CM

## 2023-11-04 DIAGNOSIS — R262 Difficulty in walking, not elsewhere classified: Secondary | ICD-10-CM

## 2023-11-04 NOTE — Therapy (Unsigned)
 OUTPATIENT PHYSICAL THERAPY TREATMENT/GOAL UPDATE  Patient Name: Tyler Glover MRN: [id number] DOB:04/09/1960, 63 y.o., male Today's Date: 11/04/2023  END OF SESSION:  PT End of Session - 11/04/23 1733     Visit Number 18    Number of Visits 18    PT Start Time 1720    PT Stop Time 1800    PT Time Calculation (min) 40 min    Activity Tolerance Patient tolerated treatment well    Behavior During Therapy WFL for tasks assessed/performed           Past Medical History:  Diagnosis Date   Allergy    Arthritis    Rheumatoid   GERD (gastroesophageal reflux disease)    Hypertension    Past Surgical History:  Procedure Laterality Date   COLONOSCOPY     COLONOSCOPY WITH PROPOFOL  N/A 08/21/2019   Procedure: COLONOSCOPY WITH PROPOFOL ;  Surgeon: Jinny Carmine, MD;  Location: [facility];  Service: Endoscopy;  Laterality: N/A;  priority 4   ESOPHAGOGASTRODUODENOSCOPY (EGD) WITH PROPOFOL  N/A 08/21/2019   Procedure: ESOPHAGOGASTRODUODENOSCOPY (EGD) WITH PROPOFOL ;  Surgeon: Jinny Carmine, MD;  Location: [facility];  Service: Endoscopy;  Laterality: N/A;   POLYPECTOMY  08/21/2019   Procedure: POLYPECTOMY;  Surgeon: Jinny Carmine, MD;  Location: [facility];  Service: Endoscopy;;   SHOULDER SURGERY     TESTICLE SURGERY     age 87 or 29   TONSILLECTOMY     Patient Active Problem List   Diagnosis Date Noted   Encounter for screening colonoscopy    Polyp of transverse colon    Heartburn    Pure hypercholesterolemia 01/26/2016   Right lateral epicondylitis 07/07/2015   Vitamin D  deficiency 12/24/2014   Prediabetes 12/24/2014   Allergic rhinitis, seasonal 06/03/2014   Acid reflux 06/03/2014   Essential (primary) hypertension 06/03/2014   Family history of diabetes mellitus 06/03/2014   Family history of cardiac disorder 06/03/2014   Obesity (BMI 30.0-34.9) 06/03/2014    PCP: Joshua Cathryne BROCKS, MD (Inactive)  REFERRING PROVIDER: Mayur Loree Blanch, MD,  PharmD  REFERRING DIAG: [phone] Lumbar spondylosis, M54.9 Back pain  RATIONALE FOR EVALUATION AND TREATMENT: Rehabilitation  THERAPY DIAG: Other low back pain  Lumbar spondylosis  Muscle weakness (generalized)  Difficulty in walking, not elsewhere classified  ONSET DATE: May 2025 (worsening symptoms, acute on chronic flare-up)  FOLLOW-UP APPT SCHEDULED WITH REFERRING PROVIDER: None currently in EMR  PERTINENT HISTORY: Pt is a 63 year old male with Parkinson's disease who ambulates at community-level without AD. Pt has new referral for lumbar spondylosis/LBP. Pt is well known to this clinic and has been attending PT with focus on gait/balance/strengthening.   Patient is s/p ESI at R L4-L5/L5-S1 neuroforamen. Pt reports acute flare-up of back pain in May of this year with worsening pain and R-sided symptoms primarily. Pt reports attempting to care for it on his own. He reports dealing with it most of this year prior to that. He feels that injections have helped. He state she still has pain along R side. Pt reports pain in R flank/R lower lumbar paraspinal region. Pt reports previous episode of L-sided sciatica - not present now. He reports no current paresthesias/numbness. Patient reports intermittent disturbed sleep due to low back.   Previous episode of care focused on gait, balance, and LLE weakness within context of Parkinson's disease. Pt met goals for outcome measures. He would still like to work on improving LLE strength and foot clearance.   PAIN:    Pain Intensity:  Present: 4-6/10, Best: 0/10, Worst: 9/10 Pain location: R lower lumbar paraspinal/R flank Pain Quality: sharp  Radiating: No  Numbness/Tingling: No Focal Weakness: Yes; weakness in LLE due to neurological deficits; no R-sided weakness per pt.  Aggravating factors: leaning over countertop/desktop, bending down to pick up items, prolonged sitting > 1-2 hours Relieving factors: Double knee to chest, heat, ice, Salonpas  patch, Tylenol  24-hour pain behavior: worse late in the day  How long can you sit: painful after 1-2 hours History of prior back injury, pain, surgery, or therapy: Yes; Hx of episodic back pain managed with chiropractic and medical management/injections  Dominant hand: right Imaging: Yes ;  CLINICAL DATA:  Low back pain progressively worsening. Right thigh numbness   EXAM: MRI LUMBAR SPINE WITHOUT CONTRAST   TECHNIQUE: Multiplanar, multisequence MR imaging of the lumbar spine was performed. No intravenous contrast was administered.   COMPARISON:  None Available.   FINDINGS: Segmentation:  Standard.   Alignment:  2 mm retrolisthesis of L2 on L3.   Vertebrae: No acute fracture, evidence of discitis, or aggressive bone lesion.   Conus medullaris and cauda equina: Conus extends to the T12-L1 level. Conus and cauda equina appear normal.   Paraspinal and other soft tissues: No acute paraspinal abnormality.   Disc levels:   Disc spaces: Degenerative disease with disc height loss at L5-S1. Mild disc height loss at L1-2. Disc desiccation at L1-2, L2-3 and L3-4.   T11-12: Mild broad-based disc bulge. No foraminal or central canal stenosis.   T12-L1: No significant disc bulge. No neural foraminal stenosis. No central canal stenosis. Mild bilateral facet arthropathy.   L1-L2: Mild broad-based disc bulge. Mild bilateral facet arthropathy. No foraminal or central canal stenosis.   L2-L3: Broad-based disc bulge. Mild bilateral facet arthropathy. Bilateral lateral recess narrowing. Minimal spinal stenosis. No foraminal stenosis.   L3-L4: Mild broad-based disc bulge. Mild bilateral facet arthropathy. No foraminal or central canal stenosis.   L4-L5: No significant disc bulge. No neural foraminal stenosis. No central canal stenosis. Mild bilateral facet arthropathy.   L5-S1: Broad-based disc osteophyte complex with a focal prominent right far lateral disc osteophyte complex.  No foraminal or central canal stenosis.   IMPRESSION: 1. Lumbar spine spondylosis as described above. 2. No acute osseous injury of the lumbar spine.   Red flags: Negative for bowel/bladder changes, saddle paresthesia, personal history of cancer, h/o spinal tumors, h/o compression fx, h/o abdominal aneurysm, abdominal pain, chills/fever, night sweats, nausea, vomiting, unrelenting pain, first onset of insidious LBP <20 y/o  PRECAUTIONS: None  WEIGHT BEARING RESTRICTIONS: No  FALLS: Has patient fallen in last 6 months? No  Living Environment Lives with: lives with their spouse and mother  Lives in: House/apartment; one step to get into home; stairs to get into home, pt has upstairs bedroom. Pt reports doing fairly well with stairs. 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   Hobbies: Visiting museums, sight-seeing, travel  Patient Goals: Reduced pain, improved movement     OBJECTIVE (data from initial evaluation unless otherwise dated):   Patient Surveys  ODI = 10/50 = 20%  GAIT: Distance walked: 40 ft Assistive device utilized: None Level of assistance: SBA Comments: Forward-flexed posture, dec step length  Posture: Moderate thoracic kyphosis and forward head, slouched posture c Hx of Parkinson's Lumbar lordosis:  WNL Iliac crest height: Equal bilaterally Lumbar lateral shift: Negative  AROM AROM (Normal range in degrees) AROM  08/21/23 AROM 09/26/23 AROM 11/04/23  Lumbar     Flexion (65) 75%* 75% (down to ankles), dull pain R flank unchanged 75%  Extension (30) 75%* 75%* WNL (pain with return to neutral)  Right lateral flexion (25) 75% WNL WNL  Left lateral flexion (25) 75% WNL (tightness R paraspinals) WNL  Right rotation (30) 100% WNL WNL  Left rotation  (30) 50% WNL WNL        Hip Right Left    Flexion (125) WNL WNL    Extension (15)      Abduction (40)      Adduction       Internal Rotation (45)      External Rotation (45)            (* = pain; Blank rows = not tested)  LE MMT: MMT (out of 5) Right 08/21/23 Left 08/21/23  Hip flexion 5 5  Hip extension    Hip abduction    Hip adduction    Hip internal rotation    Hip external rotation    Knee flexion 5 5  Knee extension 5 5-  Ankle dorsiflexion 4 4  Ankle plantarflexion    Ankle inversion    Ankle eversion    (* = pain; Blank rows = not tested)  Sensation Grossly intact to light touch throughout bilateral LEs as determined by testing dermatomes L2-S2. Proprioception, stereognosis, and hot/cold testing deferred on this date.  Reflexes R/L Knee Jerk (L3/4): 2+/2+  Ankle Jerk (S1/2): Unable to obtain  Clonus: Negative bilat   Muscle Length Hamstrings: R: Positive L: Positive [place] (quadriceps): R: Positive L: Positive  Palpation Location Right Left         Lumbar paraspinals 2   Quadratus Lumborum 1   Gluteus Maximus 1   Gluteus Medius 0   Deep hip external rotators 0   PSIS 0   Fortin's Area (SIJ) 0   Greater Trochanter    (Blank rows = not tested) Graded on 0-4 scale (0 = no pain, 1 = pain, 2 = pain with wincing/grimacing/flinching, 3 = pain with withdrawal, 4 = unwilling to allow palpation)  Special Tests Lumbar Radiculopathy and Discogenic: Centralization and Peripheralization (SN 92, -LR 0.12): Not examined.  Slump (SN 83, -LR 0.32): R: Negative L: Negative SLR (SN 92, -LR 0.29): R: Negative L:  Negative   Facet Joint: Extension-Rotation (SN 100, -LR 0.0): R: Negative L: Negative  Lumbar Foraminal Stenosis: Lumbar quadrant (SN 70): R: Negative L: Negative  Hip: FABER (SN 81): R: Negative (pain with release of FABER on R;  L: Negative   Passive Accessory Intervertebral Motion Pain at restriction L3-5 CPA, moderate hypomobility of lower lumbar  spine.      TODAY'S TREATMENT: DATE: 11/04/2023   SUBJECTIVE STATEMENT:   Patient reports doing relatively well at arrival to PT. Patient reports having one of his better weekends so far. Patient reports completing his exercises; he had to back off on how aggressive he was going into extension. Pt reports improved pain in early AM hours. Pt reports 1/10 pain at arrival to PT.    *GOAL UPDATE PERFORMED   Therapeutic Exercise - for improved soft tissue flexibility and extensibility as needed for ROM, improved strength as needed to improve performance of CKC activities/functional movements, MDT/repeated movement for symptom modulation   NuStep; Level 5, x 6 minutes - for improved soft  tissue mobility and increased tissue temperature to improve muscle performance   -subjective gathered during this time, treatment area prepared  LTR with Silver physioball; 1 x 15 alt R/L Bridge; 2 x 10 Dying bug; 2 x 10 alternating R/L, starting from hooklying  PATIENT EDUCATION: Encouraged continued HEP and discussed ongoing POC.     *not today* Open book; 1 x 10 R and L side Lower trunk rotations, hooklying; 1 x 10 alt R/L, 3 sec hold  Hooklying pelvic tilt, anterior>posterior; 2 x 10 Repeated extension in lying, 2 x 10: no effect during, better after  Supine piriformis stretch; 2 x 30 sec  3 way kick, standing; x 10 ea dir, bilat  -Red Tband around distal shins  Lower trunk rotations; with QL bias; x 10 (LLE crossed to stretch R QL today), 3 sec hold Hooklying lumbar traction, 10-sec intermittent holds; x 6 minutes, PT at foot of treatment table    Manual Therapy - for symptom modulation, soft tissue sensitivity and mobility, joint mobility, ROM   STM/DTM and IASTM along R longissimus/iliocostalis lumborum L3-S1 and R gluteal musculature; x 23 minutes Generalized thoracic and rib mobilization in prone lying, gr II; T7-T12  *not today* CPA ; 2 x 30 sec bouts, gr III for joint mobility  L3-L5 General manual lumbar traction in hooklying with Mulligan belt; therapist at foot of patient; 10 sec on, 10 sec off x 5 minutes for nerve root decompression, pain control     PATIENT EDUCATION:  Education details: see above for patient education details Person educated: Patient Education method: Explanation, Demonstration, and Handouts Education comprehension: verbalized understanding and returned demonstration   HOME EXERCISE PROGRAM:  Access Code: [email or web address] URL: [email or web address] Date: 09/12/2023 Prepared by: Venetia Endo  Exercises - Prone Press Up  - 5-6 x daily - 7 x weekly - 1 sets - 10 reps - 1sec hold - Supine Quadratus Lumborum Stretch  - 2 x daily - 7 x weekly - 2 sets - 10 reps - 2-3sec hold - Seated Hamstring Stretch  - 2 x daily - 7 x weekly - 3 sets - 30sec hold - Supine Piriformis Stretch with Foot on Ground  - 2 x daily - 7 x weekly - 3 sets - 30sec hold   ASSESSMENT:  CLINICAL IMPRESSION: Patient has moderately higher NPRS at baseline today due to 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 He spent ample time bend over fenders of vehicles and reports pain in typical R flank area and along thoracic spine as well. We spent more time on manual therapy and focus on symptom modulation versus active exercise; we will plan to continue with progressive exercise and extension-bias drills next visit pending resolution of flare-up from this week.  Pt has current deficits in thoracolumbar AROM, decreased LLE strength, lumbar spine stiffness/hypomobility, posterior chain tightness/dec flexibility. Pt will continue to benefit from skilled PT services to address deficits and improve function.   OBJECTIVE IMPAIRMENTS: Abnormal gait, decreased mobility, decreased ROM, decreased strength, hypomobility, impaired flexibility, postural dysfunction, and pain.   ACTIVITY LIMITATIONS: lifting, bending, sitting, squatting,  sleeping, and transfers  PARTICIPATION LIMITATIONS: cleaning, laundry, driving, shopping, community activity, and occupation (teaches Research scientist (medical) at [employer])  PERSONAL FACTORS: Past/current experiences, Time since onset of injury/illness/exacerbation, and 3+ comorbidities: (HTN, GERD, Parkinson's disease, OA) are also affecting patient's functional outcome.   REHAB POTENTIAL: Good  CLINICAL DECISION MAKING: Evolving/moderate complexity  EVALUATION COMPLEXITY: Moderate   GOALS: Goals reviewed with patient?  Yes  SHORT TERM GOALS: Target date: 09/12/2023  Pt will be independent with HEP in order to improve strength and decrease back pain to improve pain-free function at home and work. Baseline: 08/21/23: Baseline HEP initiated.     09/26/23: Pt mostly compliant with HEP, some missed days or decreased frequency with his schedule.   11/04/23: Pt is compliant with HEP.  Goal status: ACHIEVED   LONG TERM GOALS: Target date: 10/10/2023  Patient will have full thoracolumbar AROM without reproduction of pain as needed for reaching items on ground, household chores, bending Baseline: 08/21/23: Motion loss and pain with flexion, motion loss with extension, bilateral lateral flexion, L rotation.    09/26/23: Motion loss and pain with flexion and extension. No notable motion loss or pain in frontal or transverse plane.  11/04/23: Patient has pain with return to neutral from extension and mild motion loss with flexion  Goal status: IN PROGRESS/MOSTLY MET  2.  Pt will decrease worst back pain by at least 2 points on the NPRS in order to demonstrate clinically significant reduction in back pain. Baseline: 08/21/23: 9/10    09/26/23: 8/10 at worst over previous week 11/04/23: 2/10 at worst in low back.  Goal status: ACHIEVED  3.  Pt will decrease mODI score by at least 13 points in order demonstrate clinically significant reduction in back pain/disability.       Baseline: 08/21/23: 10/50 = 20%       09/26/23: 11/50 = 22% 11/04/23: 6/50 = 12% Goal status: IN PROGRESS   4.  Pt will tolerate sitting for leisure activity and traveling up to 1 hour without reproduction of pain.  Baseline: 08/21/23: Pain with sitting > 1-2 hr.    09/26/23: Up to 2 hours.  Goal status: ACHIEVED    PLAN: PT FREQUENCY: 1-2x/week  PT DURATION: 3-4 weeks  PLANNED INTERVENTIONS: Therapeutic exercises, Therapeutic activity, Neuromuscular re-education, Balance training, Gait training, Patient/Family education, Self Care, Joint mobilization, Joint manipulation, Vestibular training, Canalith repositioning, Orthotic/Fit training, DME instructions, Dry Needling, Electrical stimulation, Spinal manipulation, Spinal mobilization, Cryotherapy, Moist heat, Taping, Traction, Ultrasound, Ionotophoresis 4mg /ml Dexamethasone, Manual therapy, and Re-evaluation.  PLAN FOR NEXT SESSION: Continue with thoracolumbar mobility. Manual techniques/DTM along R QL/iliocostalis lumborum. Dry needling at future visits prn. Postural re-education.    Venetia Endo, PT, DPT #[id number]  Venetia ONEIDA Endo, PT 11/04/2023, 5:33 PM

## 2023-11-06 ENCOUNTER — Ambulatory Visit: Admitting: Physical Therapy

## 2023-11-06 ENCOUNTER — Encounter: Payer: Self-pay | Admitting: Physical Therapy

## 2023-11-06 ENCOUNTER — Encounter: Admitting: Physician Assistant

## 2023-11-11 ENCOUNTER — Ambulatory Visit: Admitting: Physical Therapy

## 2023-11-13 ENCOUNTER — Ambulatory Visit: Attending: Rheumatology | Admitting: Physical Therapy

## 2023-11-13 DIAGNOSIS — M5459 Other low back pain: Secondary | ICD-10-CM | POA: Insufficient documentation

## 2023-11-13 DIAGNOSIS — R262 Difficulty in walking, not elsewhere classified: Secondary | ICD-10-CM | POA: Diagnosis present

## 2023-11-13 DIAGNOSIS — M6281 Muscle weakness (generalized): Secondary | ICD-10-CM | POA: Insufficient documentation

## 2023-11-13 DIAGNOSIS — M47816 Spondylosis without myelopathy or radiculopathy, lumbar region: Secondary | ICD-10-CM | POA: Diagnosis present

## 2023-11-13 NOTE — Therapy (Unsigned)
 OUTPATIENT PHYSICAL THERAPY TREATMENT  Patient Name: Tyler Glover MRN: [id number] DOB:Aug 22, 1960, 63 y.o., male Today's Date: 11/13/2023   END OF SESSION:  PT End of Session - 11/13/23 1727     Visit Number 19    Number of Visits 22    PT Start Time 1722    PT Stop Time 1800    PT Time Calculation (min) 38 min    Activity Tolerance Patient tolerated treatment well    Behavior During Therapy WFL for tasks assessed/performed            Past Medical History:  Diagnosis Date   Allergy    Arthritis    Rheumatoid   GERD (gastroesophageal reflux disease)    Hypertension    Past Surgical History:  Procedure Laterality Date   COLONOSCOPY     COLONOSCOPY WITH PROPOFOL  N/A 08/21/2019   Procedure: COLONOSCOPY WITH PROPOFOL ;  Surgeon: Jinny Carmine, MD;  Location: [facility];  Service: Endoscopy;  Laterality: N/A;  priority 4   ESOPHAGOGASTRODUODENOSCOPY (EGD) WITH PROPOFOL  N/A 08/21/2019   Procedure: ESOPHAGOGASTRODUODENOSCOPY (EGD) WITH PROPOFOL ;  Surgeon: Jinny Carmine, MD;  Location: [facility];  Service: Endoscopy;  Laterality: N/A;   POLYPECTOMY  08/21/2019   Procedure: POLYPECTOMY;  Surgeon: Jinny Carmine, MD;  Location: [facility];  Service: Endoscopy;;   SHOULDER SURGERY     TESTICLE SURGERY     age 72 or 67   TONSILLECTOMY     Patient Active Problem List   Diagnosis Date Noted   Encounter for screening colonoscopy    Polyp of transverse colon    Heartburn    Pure hypercholesterolemia 01/26/2016   Right lateral epicondylitis 07/07/2015   Vitamin D  deficiency 12/24/2014   Prediabetes 12/24/2014   Allergic rhinitis, seasonal 06/03/2014   Acid reflux 06/03/2014   Essential (primary) hypertension 06/03/2014   Family history of diabetes mellitus 06/03/2014   Family history of cardiac disorder 06/03/2014   Obesity (BMI 30.0-34.9) 06/03/2014    PCP: Joshua Cathryne BROCKS, MD (Inactive)  REFERRING PROVIDER: Mayur Loree Blanch, MD,  PharmD  REFERRING DIAG: [phone] Lumbar spondylosis, M54.9 Back pain  RATIONALE FOR EVALUATION AND TREATMENT: Rehabilitation  THERAPY DIAG: Other low back pain  Lumbar spondylosis  Muscle weakness (generalized)  Difficulty in walking, not elsewhere classified  ONSET DATE: May 2025 (worsening symptoms, acute on chronic flare-up)  FOLLOW-UP APPT SCHEDULED WITH REFERRING PROVIDER: None currently in EMR  PERTINENT HISTORY: Pt is a 63 year old male with Parkinson's disease who ambulates at community-level without AD. Pt has new referral for lumbar spondylosis/LBP. Pt is well known to this clinic and has been attending PT with focus on gait/balance/strengthening.   Patient is s/p ESI at R L4-L5/L5-S1 neuroforamen. Pt reports acute flare-up of back pain in May of this year with worsening pain and R-sided symptoms primarily. Pt reports attempting to care for it on his own. He reports dealing with it most of this year prior to that. He feels that injections have helped. He state she still has pain along R side. Pt reports pain in R flank/R lower lumbar paraspinal region. Pt reports previous episode of L-sided sciatica - not present now. He reports no current paresthesias/numbness. Patient reports intermittent disturbed sleep due to low back.   Previous episode of care focused on gait, balance, and LLE weakness within context of Parkinson's disease. Pt met goals for outcome measures. He would still like to work on improving LLE strength and foot clearance.   PAIN:    Pain  Intensity: Present: 4-6/10, Best: 0/10, Worst: 9/10 Pain location: R lower lumbar paraspinal/R flank Pain Quality: sharp  Radiating: No  Numbness/Tingling: No Focal Weakness: Yes; weakness in LLE due to neurological deficits; no R-sided weakness per pt.  Aggravating factors: leaning over countertop/desktop, bending down to pick up items, prolonged sitting > 1-2 hours Relieving factors: Double knee to chest, heat, ice, Salonpas  patch, Tylenol  24-hour pain behavior: worse late in the day  How long can you sit: painful after 1-2 hours History of prior back injury, pain, surgery, or therapy: Yes; Hx of episodic back pain managed with chiropractic and medical management/injections  Dominant hand: right Imaging: Yes ;  CLINICAL DATA:  Low back pain progressively worsening. Right thigh numbness   EXAM: MRI LUMBAR SPINE WITHOUT CONTRAST   TECHNIQUE: Multiplanar, multisequence MR imaging of the lumbar spine was performed. No intravenous contrast was administered.   COMPARISON:  None Available.   FINDINGS: Segmentation:  Standard.   Alignment:  2 mm retrolisthesis of L2 on L3.   Vertebrae: No acute fracture, evidence of discitis, or aggressive bone lesion.   Conus medullaris and cauda equina: Conus extends to the T12-L1 level. Conus and cauda equina appear normal.   Paraspinal and other soft tissues: No acute paraspinal abnormality.   Disc levels:   Disc spaces: Degenerative disease with disc height loss at L5-S1. Mild disc height loss at L1-2. Disc desiccation at L1-2, L2-3 and L3-4.   T11-12: Mild broad-based disc bulge. No foraminal or central canal stenosis.   T12-L1: No significant disc bulge. No neural foraminal stenosis. No central canal stenosis. Mild bilateral facet arthropathy.   L1-L2: Mild broad-based disc bulge. Mild bilateral facet arthropathy. No foraminal or central canal stenosis.   L2-L3: Broad-based disc bulge. Mild bilateral facet arthropathy. Bilateral lateral recess narrowing. Minimal spinal stenosis. No foraminal stenosis.   L3-L4: Mild broad-based disc bulge. Mild bilateral facet arthropathy. No foraminal or central canal stenosis.   L4-L5: No significant disc bulge. No neural foraminal stenosis. No central canal stenosis. Mild bilateral facet arthropathy.   L5-S1: Broad-based disc osteophyte complex with a focal prominent right far lateral disc osteophyte complex.  No foraminal or central canal stenosis.   IMPRESSION: 1. Lumbar spine spondylosis as described above. 2. No acute osseous injury of the lumbar spine.   Red flags: Negative for bowel/bladder changes, saddle paresthesia, personal history of cancer, h/o spinal tumors, h/o compression fx, h/o abdominal aneurysm, abdominal pain, chills/fever, night sweats, nausea, vomiting, unrelenting pain, first onset of insidious LBP <20 y/o  PRECAUTIONS: None  WEIGHT BEARING RESTRICTIONS: No  FALLS: Has patient fallen in last 6 months? No  Living Environment Lives with: lives with their spouse and mother  Lives in: House/apartment; one step to get into home; stairs to get into home, pt has upstairs bedroom. Pt reports doing fairly well with stairs. 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   Hobbies: Visiting museums, sight-seeing, travel  Patient Goals: Reduced pain, improved movement     OBJECTIVE (data from initial evaluation unless otherwise dated):   Patient Surveys  ODI = 10/50 = 20%  GAIT: Distance walked: 40 ft Assistive device utilized: None Level of assistance: SBA Comments: Forward-flexed posture, dec step length  Posture: Moderate thoracic kyphosis and forward head, slouched posture c Hx of Parkinson's Lumbar  lordosis: WNL Iliac crest height: Equal bilaterally Lumbar lateral shift: Negative  AROM AROM (Normal range in degrees) AROM  08/21/23 AROM 09/26/23 AROM 11/04/23  Lumbar     Flexion (65) 75%* 75% (down to ankles), dull pain R flank unchanged 75%  Extension (30) 75%* 75%* WNL (pain with return to neutral)  Right lateral flexion (25) 75% WNL WNL  Left lateral flexion (25) 75% WNL (tightness R paraspinals) WNL  Right rotation (30) 100% WNL WNL  Left rotation  (30) 50% WNL WNL        Hip Right Left    Flexion (125) WNL WNL    Extension (15)      Abduction (40)      Adduction       Internal Rotation (45)      External Rotation (45)            (* = pain; Blank rows = not tested)  LE MMT: MMT (out of 5) Right 08/21/23 Left 08/21/23  Hip flexion 5 5  Hip extension    Hip abduction    Hip adduction    Hip internal rotation    Hip external rotation    Knee flexion 5 5  Knee extension 5 5-  Ankle dorsiflexion 4 4  Ankle plantarflexion    Ankle inversion    Ankle eversion    (* = pain; Blank rows = not tested)  Sensation Grossly intact to light touch throughout bilateral LEs as determined by testing dermatomes L2-S2. Proprioception, stereognosis, and hot/cold testing deferred on this date.  Reflexes R/L Knee Jerk (L3/4): 2+/2+  Ankle Jerk (S1/2): Unable to obtain  Clonus: Negative bilat   Muscle Length Hamstrings: R: Positive L: Positive Ely (quadriceps): R: Positive L: Positive  Palpation Location Right Left         Lumbar paraspinals 2   Quadratus Lumborum 1   Gluteus Maximus 1   Gluteus Medius 0   Deep hip external rotators 0   PSIS 0   Fortin's Area (SIJ) 0   Greater Trochanter    (Blank rows = not tested) Graded on 0-4 scale (0 = no pain, 1 = pain, 2 = pain with wincing/grimacing/flinching, 3 = pain with withdrawal, 4 = unwilling to allow palpation)  Special Tests Lumbar Radiculopathy and Discogenic: Centralization and Peripheralization (SN 92, -LR 0.12): Not examined.  Slump (SN 83, -LR 0.32): R: Negative L: Negative SLR (SN 92, -LR 0.29): R: Negative L:  Negative   Facet Joint: Extension-Rotation (SN 100, -LR 0.0): R: Negative L: Negative  Lumbar Foraminal Stenosis: Lumbar quadrant (SN 70): R: Negative L: Negative  Hip: FABER (SN 81): R: Negative (pain with release of FABER on R;  L: Negative   Passive Accessory Intervertebral Motion Pain at restriction L3-5 CPA, moderate hypomobility of lower lumbar  spine.      TODAY'S TREATMENT: DATE: 11/04/2023   SUBJECTIVE STATEMENT:   Patient reports 0.5/10 NPRS at arrival. He reports improved noctural symptoms. He feels that his back is doing well, but his Parkinson's symptoms have gotten a lot worse. He reports difficulty with initiating movement and more bradykinesia.     Therapeutic Exercise - for improved soft tissue flexibility and extensibility as needed for ROM, improved strength as needed to improve performance of CKC activities/functional movements, MDT/repeated movement for symptom modulation  Repeated extension in lying, 2 x 10: no effect during, better after   LTR with Silver physioball; 1 x 15 alt R/L Bridge; 2 x 10 Dying bug; 1  x 10 alternating R/L, starting from hooklying  PATIENT EDUCATION: Encouraged continued HEP and discussed ongoing POC.    *not today* Open book; 1 x 10 R and L side Lower trunk rotations, hooklying; 1 x 10 alt R/L, 3 sec hold  Hooklying pelvic tilt, anterior>posterior; 2 x 10 Supine piriformis stretch; 2 x 30 sec  3 way kick, standing; x 10 ea dir, bilat  -Red Tband around distal shins  Lower trunk rotations; with QL bias; x 10 (LLE crossed to stretch R QL today), 3 sec hold Hooklying lumbar traction, 10-sec intermittent holds; x 6 minutes, PT at foot of treatment table     Manual Therapy - for symptom modulation, soft tissue sensitivity and mobility, joint mobility, ROM   *not today* STM/DTM and IASTM along R longissimus/iliocostalis lumborum L3-S1 and R gluteal musculature; x 23 minutes Generalized thoracic and rib mobilization in prone lying, gr II; T7-T12 CPA ; 2 x 30 sec bouts, gr III for joint mobility L3-L5 General manual lumbar traction in hooklying with Mulligan belt; therapist at foot of patient; 10 sec on, 10 sec off x 5 minutes for nerve root decompression, pain control     PATIENT EDUCATION:  Education details: see above for patient education details Person educated:  Patient Education method: Explanation, Demonstration, and Handouts Education comprehension: verbalized understanding and returned demonstration   HOME EXERCISE PROGRAM:  Access Code: [email or web address] URL: [email or web address] Date: 09/12/2023 Prepared by: Venetia Endo  Exercises - Prone Press Up  - 5-6 x daily - 7 x weekly - 1 sets - 10 reps - 1sec hold - Supine Quadratus Lumborum Stretch  - 2 x daily - 7 x weekly - 2 sets - 10 reps - 2-3sec hold - Seated Hamstring Stretch  - 2 x daily - 7 x weekly - 3 sets - 30sec hold - Supine Piriformis Stretch with Foot on Ground  - 2 x daily - 7 x weekly - 3 sets - 30sec hold   ASSESSMENT:  CLINICAL IMPRESSION: Patient fortunately has decreased Modified Oswestry, though meeting goal will be challenging due to lower baseline score/floor effect. Pt fortunately has significant decrease in NPRS. He has ongoing pain with sagittal plane ROM, but frontal and transverse plane is normal and pain-free. Pt has notably improved positional tolerance for sitting and standing. Pt has made good progress to date and is approaching end-phase of rehab at this time. Pt will be going out of town for automotive event through the weekend and will report back how he tolerates practicing automotive skills and traveling. Pt has remaining deficits in sagittal plane thoracolumbar AROM, decreased LLE strength, lumbar spine stiffness/hypomobility, posterior chain tightness/dec flexibility. Pt will continue to benefit from skilled PT services to address deficits and improve function.   OBJECTIVE IMPAIRMENTS: Abnormal gait, decreased mobility, decreased ROM, decreased strength, hypomobility, impaired flexibility, postural dysfunction, and pain.   ACTIVITY LIMITATIONS: lifting, bending, sitting, squatting, sleeping, and transfers  PARTICIPATION LIMITATIONS: cleaning, laundry, driving, shopping, community activity, and occupation (teaches Research scientist (medical) at [employer])  PERSONAL FACTORS: Past/current experiences, Time since onset of injury/illness/exacerbation, and 3+ comorbidities: (HTN, GERD, Parkinson's disease, OA) are also affecting patient's functional outcome.   REHAB POTENTIAL: Good  CLINICAL DECISION MAKING: Evolving/moderate complexity  EVALUATION COMPLEXITY: Moderate   GOALS: Goals reviewed with patient? Yes  SHORT TERM GOALS: Target date: 09/12/2023  Pt will be independent with HEP in order to improve strength and decrease back pain to improve pain-free function at home and work. Baseline: 08/21/23: Baseline HEP initiated.  09/26/23: Pt mostly compliant with HEP, some missed days or decreased frequency with his schedule.   11/04/23: Pt is compliant with HEP.  Goal status: ACHIEVED   LONG TERM GOALS: Target date: 10/10/2023  Patient will have full thoracolumbar AROM without reproduction of pain as needed for reaching items on ground, household chores, bending Baseline: 08/21/23: Motion loss and pain with flexion, motion loss with extension, bilateral lateral flexion, L rotation.    09/26/23: Motion loss and pain with flexion and extension. No notable motion loss or pain in frontal or transverse plane.  11/04/23: Patient has pain with return to neutral from extension and mild motion loss with flexion  Goal status: IN PROGRESS/MOSTLY MET  2.  Pt will decrease worst back pain by at least 2 points on the NPRS in order to demonstrate clinically significant reduction in back pain. Baseline: 08/21/23: 9/10    09/26/23: 8/10 at worst over previous week 11/04/23: 2/10 at worst in low back.  Goal status: ACHIEVED  3.  Pt will decrease mODI score by at least 13 points in order demonstrate clinically significant reduction in back pain/disability.       Baseline: 08/21/23: 10/50 = 20%      09/26/23: 11/50 = 22% 11/04/23: 6/50 = 12% Goal status: IN PROGRESS   4.  Pt will tolerate sitting for leisure activity and traveling up to 1 hour without  reproduction of pain.  Baseline: 08/21/23: Pain with sitting > 1-2 hr.    09/26/23: Up to 2 hours.  Goal status: ACHIEVED    PLAN: PT FREQUENCY: 1-2x/week  PT DURATION: 3-4 weeks  PLANNED INTERVENTIONS: Therapeutic exercises, Therapeutic activity, Neuromuscular re-education, Balance training, Gait training, Patient/Family education, Self Care, Joint mobilization, Joint manipulation, Vestibular training, Canalith repositioning, Orthotic/Fit training, DME instructions, Dry Needling, Electrical stimulation, Spinal manipulation, Spinal mobilization, Cryotherapy, Moist heat, Taping, Traction, Ultrasound, Ionotophoresis 4mg /ml Dexamethasone, Manual therapy, and Re-evaluation.  PLAN FOR NEXT SESSION: Continue with thoracolumbar mobility. Manual techniques/DTM along R QL/iliocostalis lumborum. Continue with posterior chain strengthening/gluteal strengthening as tolerated.    Venetia Endo, PT, DPT #[id number]  Venetia ONEIDA Endo, PT 11/13/2023, 5:27 PM

## 2023-11-14 ENCOUNTER — Encounter: Payer: Self-pay | Admitting: Physical Therapy

## 2023-11-27 ENCOUNTER — Encounter: Payer: Self-pay | Admitting: Physical Therapy

## 2023-11-27 ENCOUNTER — Ambulatory Visit: Admitting: Physical Therapy

## 2023-11-27 DIAGNOSIS — M5459 Other low back pain: Secondary | ICD-10-CM | POA: Diagnosis not present

## 2023-11-27 DIAGNOSIS — M47816 Spondylosis without myelopathy or radiculopathy, lumbar region: Secondary | ICD-10-CM

## 2023-11-27 DIAGNOSIS — M6281 Muscle weakness (generalized): Secondary | ICD-10-CM

## 2023-11-27 NOTE — Therapy (Signed)
 OUTPATIENT PHYSICAL THERAPY TREATMENT AND PROGRESS NOTE   Dates of reporting period  11/04/23   to   11/27/23   Patient Name: Tyler Glover MRN: [id number] DOB:[birth year], 63 y.o., male Today's Date: 11/27/2023   END OF SESSION:  PT End of Session - 11/27/23 1245     Visit Number 20    Number of Visits 22    PT Start Time 1245    PT Stop Time 1329    PT Time Calculation (min) 44 min    Activity Tolerance Patient tolerated treatment well    Behavior During Therapy WFL for tasks assessed/performed             Past Medical History:  Diagnosis Date   Allergy    Arthritis    Rheumatoid   GERD (gastroesophageal reflux disease)    Hypertension    Past Surgical History:  Procedure Laterality Date   COLONOSCOPY     COLONOSCOPY WITH PROPOFOL  N/A 08/21/2019   Procedure: COLONOSCOPY WITH PROPOFOL ;  Surgeon: Jinny Carmine, MD;  Location: [facility];  Service: Endoscopy;  Laterality: N/A;  priority 4   ESOPHAGOGASTRODUODENOSCOPY (EGD) WITH PROPOFOL  N/A 08/21/2019   Procedure: ESOPHAGOGASTRODUODENOSCOPY (EGD) WITH PROPOFOL ;  Surgeon: Jinny Carmine, MD;  Location: [facility];  Service: Endoscopy;  Laterality: N/A;   POLYPECTOMY  08/21/2019   Procedure: POLYPECTOMY;  Surgeon: Jinny Carmine, MD;  Location: [facility];  Service: Endoscopy;;   SHOULDER SURGERY     TESTICLE SURGERY     age 89 or 40   TONSILLECTOMY     Patient Active Problem List   Diagnosis Date Noted   Encounter for screening colonoscopy    Polyp of transverse colon    Heartburn    Pure hypercholesterolemia 01/26/2016   Right lateral epicondylitis 07/07/2015   Vitamin D  deficiency 12/24/2014   Prediabetes 12/24/2014   Allergic rhinitis, seasonal 06/03/2014   Acid reflux 06/03/2014   Essential (primary) hypertension 06/03/2014   Family history of diabetes mellitus 06/03/2014   Family history of cardiac disorder 06/03/2014   Obesity (BMI 30.0-34.9) 06/03/2014    PCP: Joshua Cathryne BROCKS, MD (Inactive)  REFERRING PROVIDER: Mayur Loree Blanch, MD, PharmD  REFERRING DIAG: [phone] Lumbar spondylosis, M54.9 Back pain  RATIONALE FOR EVALUATION AND TREATMENT: Rehabilitation  THERAPY DIAG: Other low back pain  Lumbar spondylosis  Muscle weakness (generalized)  ONSET DATE: May 2025 (worsening symptoms, acute on chronic flare-up)  FOLLOW-UP APPT SCHEDULED WITH REFERRING PROVIDER: None currently in EMR  PERTINENT HISTORY: Pt is a 63 year old male with Parkinson's disease who ambulates at community-level without AD. Pt has new referral for lumbar spondylosis/LBP. Pt is well known to this clinic and has been attending PT with focus on gait/balance/strengthening.   Patient is s/p ESI at R L4-L5/L5-S1 neuroforamen. Pt reports acute flare-up of back pain in May of this year with worsening pain and R-sided symptoms primarily. Pt reports attempting to care for it on his own. He reports dealing with it most of this year prior to that. He feels that injections have helped. He state she still has pain along R side. Pt reports pain in R flank/R lower lumbar paraspinal region. Pt reports previous episode of L-sided sciatica - not present now. He reports no current paresthesias/numbness. Patient reports intermittent disturbed sleep due to low back.   Previous episode of care focused on gait, balance, and LLE weakness within context of Parkinson's disease. Pt met goals for outcome measures. He would still like to work on improving  LLE strength and foot clearance.   PAIN:    Pain Intensity: Present: 4-6/10, Best: 0/10, Worst: 9/10 Pain location: R lower lumbar paraspinal/R flank Pain Quality: sharp  Radiating: No  Numbness/Tingling: No Focal Weakness: Yes; weakness in LLE due to neurological deficits; no R-sided weakness per pt.  Aggravating factors: leaning over countertop/desktop, bending down to pick up items, prolonged sitting > 1-2 hours Relieving factors: Double knee to chest, heat,  ice, Salonpas patch, Tylenol  24-hour pain behavior: worse late in the day  How long can you sit: painful after 1-2 hours History of prior back injury, pain, surgery, or therapy: Yes; Hx of episodic back pain managed with chiropractic and medical management/injections  Dominant hand: right Imaging: Yes ;  CLINICAL DATA:  Low back pain progressively worsening. Right thigh numbness   EXAM: MRI LUMBAR SPINE WITHOUT CONTRAST   TECHNIQUE: Multiplanar, multisequence MR imaging of the lumbar spine was performed. No intravenous contrast was administered.   COMPARISON:  None Available.   FINDINGS: Segmentation:  Standard.   Alignment:  2 mm retrolisthesis of L2 on L3.   Vertebrae: No acute fracture, evidence of discitis, or aggressive bone lesion.   Conus medullaris and cauda equina: Conus extends to the T12-L1 level. Conus and cauda equina appear normal.   Paraspinal and other soft tissues: No acute paraspinal abnormality.   Disc levels:   Disc spaces: Degenerative disease with disc height loss at L5-S1. Mild disc height loss at L1-2. Disc desiccation at L1-2, L2-3 and L3-4.   T11-12: Mild broad-based disc bulge. No foraminal or central canal stenosis.   T12-L1: No significant disc bulge. No neural foraminal stenosis. No central canal stenosis. Mild bilateral facet arthropathy.   L1-L2: Mild broad-based disc bulge. Mild bilateral facet arthropathy. No foraminal or central canal stenosis.   L2-L3: Broad-based disc bulge. Mild bilateral facet arthropathy. Bilateral lateral recess narrowing. Minimal spinal stenosis. No foraminal stenosis.   L3-L4: Mild broad-based disc bulge. Mild bilateral facet arthropathy. No foraminal or central canal stenosis.   L4-L5: No significant disc bulge. No neural foraminal stenosis. No central canal stenosis. Mild bilateral facet arthropathy.   L5-S1: Broad-based disc osteophyte complex with a focal prominent right far lateral disc  osteophyte complex. No foraminal or central canal stenosis.   IMPRESSION: 1. Lumbar spine spondylosis as described above. 2. No acute osseous injury of the lumbar spine.   Red flags: Negative for bowel/bladder changes, saddle paresthesia, personal history of cancer, h/o spinal tumors, h/o compression fx, h/o abdominal aneurysm, abdominal pain, chills/fever, night sweats, nausea, vomiting, unrelenting pain, first onset of insidious LBP <20 y/o  PRECAUTIONS: None  WEIGHT BEARING RESTRICTIONS: No  FALLS: Has patient fallen in last 6 months? No  Living Environment Lives with: lives with their spouse and mother  Lives in: House/apartment; one step to get into home; stairs to get into home, pt has upstairs bedroom. Pt reports doing fairly well with stairs. 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   Hobbies: Visiting museums, sight-seeing, travel  Patient Goals: Reduced pain, improved movement     OBJECTIVE (data from initial evaluation unless otherwise dated):   Patient Surveys  ODI = 10/50 = 20%  GAIT: Distance walked: 40 ft Assistive device utilized: None Level of assistance: SBA Comments: Forward-flexed posture, dec step length  Posture: Moderate  thoracic kyphosis and forward head, slouched posture c Hx of Parkinson's Lumbar lordosis: WNL Iliac crest height: Equal bilaterally Lumbar lateral shift: Negative  AROM AROM (Normal range in degrees) AROM  08/21/23 AROM 09/26/23 AROM 11/04/23 AROM 11/27/23  Lumbar      Flexion (65) 75%* 75% (down to ankles), dull pain R flank unchanged 75% WFL (down to ankles), mild discomfort axial low back  Extension (30) 75%* 75%* WNL (pain with return to neutral) WNL (mild discomfort axial low back)  Right lateral flexion (25) 75% WNL  WNL WNL  Left lateral flexion (25) 75% WNL (tightness R paraspinals) WNL WNL  Right rotation (30) 100% WNL WNL WNL  Left rotation (30) 50% WNL WNL WNL         Hip Right Left     Flexion (125) WNL WNL     Extension (15)       Abduction (40)       Adduction        Internal Rotation (45)       External Rotation (45)              (* = pain; Blank rows = not tested)  LE MMT: MMT (out of 5) Right 08/21/23 Left 08/21/23  Hip flexion 5 5  Hip extension    Hip abduction    Hip adduction    Hip internal rotation    Hip external rotation    Knee flexion 5 5  Knee extension 5 5-  Ankle dorsiflexion 4 4  Ankle plantarflexion    Ankle inversion    Ankle eversion    (* = pain; Blank rows = not tested)  Sensation Grossly intact to light touch throughout bilateral LEs as determined by testing dermatomes L2-S2. Proprioception, stereognosis, and hot/cold testing deferred on this date.  Reflexes R/L Knee Jerk (L3/4): 2+/2+  Ankle Jerk (S1/2): Unable to obtain  Clonus: Negative bilat   Muscle Length Hamstrings: R: Positive L: Positive [place] (quadriceps): R: Positive L: Positive  Palpation Location Right Left         Lumbar paraspinals 2   Quadratus Lumborum 1   Gluteus Maximus 1   Gluteus Medius 0   Deep hip external rotators 0   PSIS 0   Fortin's Area (SIJ) 0   Greater Trochanter    (Blank rows = not tested) Graded on 0-4 scale (0 = no pain, 1 = pain, 2 = pain with wincing/grimacing/flinching, 3 = pain with withdrawal, 4 = unwilling to allow palpation)  Special Tests Lumbar Radiculopathy and Discogenic: Centralization and Peripheralization (SN 92, -LR 0.12): Not examined.  Slump (SN 83, -LR 0.32): R: Negative L: Negative SLR (SN 92, -LR 0.29): R: Negative L:  Negative   Facet Joint: Extension-Rotation (SN 100, -LR 0.0): R: Negative L: Negative  Lumbar Foraminal Stenosis: Lumbar quadrant (SN 70): R: Negative L: Negative  Hip: FABER (SN 81): R: Negative (pain with  release of FABER on R;  L: Negative   Passive Accessory Intervertebral Motion Pain at restriction L3-5 CPA, moderate hypomobility of lower lumbar spine.      TODAY'S TREATMENT: DATE: 11/27/2023   SUBJECTIVE STATEMENT:   Patient reports 0.5-1/10 NPRS at arrival. He reports excellent response to intervention to date, and he reports tolerating recent work duties well. 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 Pt reports doing well with exercises added last visit.   *GOAL UPDATE PERFORMED   Therapeutic Exercise -  for improved soft tissue flexibility and extensibility as needed for ROM, improved strength as needed to improve performance of CKC activities/functional movements, MDT/repeated movement for symptom modulation   Lower trunk rotations, hooklying; 1 x 10 alt R/L, 3 sec hold   Repeated extension in lying, 2 x 10: no effect during, better after   Dying bug; 1 x 10 alternating R/L, starting from hooklying  -discussed progression of dying bug to include maintaining LE hovering through movement  PATIENT EDUCATION: Encouraged continued HEP and discussed tapered POC/future discharge plan. New [employer] handout provided.     *not today* Bridge; 2 x 10  Open book; 1 x 10 R and L side LTR with Silver physioball; 1 x 15 alt R/L Hooklying pelvic tilt, anterior>posterior; 2 x 10 Supine piriformis stretch; 2 x 30 sec  3 way kick, standing; x 10 ea dir, bilat  -Red Tband around distal shins  Lower trunk rotations; with QL bias; x 10 (LLE crossed to stretch R QL today), 3 sec hold Hooklying lumbar traction, 10-sec intermittent holds; x 6 minutes, PT at foot of treatment table     Manual Therapy - for symptom modulation, soft tissue sensitivity and mobility, joint mobility, ROM   STM/DTM and IASTM along R longissimus/iliocostalis lumborum L3-S1 and R gluteal musculature; x 10 minutes CPA ; 3 x  30 sec bouts, gr III for joint mobility L3-L5   *not today* General manual lumbar traction in hooklying with Mulligan belt; therapist at foot of patient; 10 sec on, 10 sec off x 5 minutes for nerve root decompression, pain control Generalized thoracic and rib mobilization in prone lying, gr II; T7-T12      PATIENT EDUCATION:  Education details: see above for patient education details Person educated: Patient Education method: Explanation, Demonstration, and Handouts Education comprehension: verbalized understanding and returned demonstration   HOME EXERCISE PROGRAM:  Access Code: [email or web address] URL: [email or web address] Date: 11/27/2023 Prepared by: Venetia Endo  Exercises - Prone Press Up  - 5-6 x daily - 7 x weekly - 1 sets - 10 reps - 1sec hold - Supine Quadratus Lumborum Stretch  - 2 x daily - 7 x weekly - 2 sets - 10 reps - 2-3sec hold - Seated Hamstring Stretch  - 2 x daily - 7 x weekly - 3 sets - 30sec hold - Supine Piriformis Stretch with Foot on Ground  - 2 x daily - 7 x weekly - 3 sets - 30sec hold - Supine Bridge  - 1 x daily - 4 x weekly - 2 sets - 10 reps - Supine Dead Bug with Leg Extension  - 1 x daily - 4 x weekly - 2 sets - 10 reps - Sidelying Thoracic Rotation with Open Book  - 1 x daily - 4 x weekly - 2 sets - 10 reps - Sit to Stand Without Arm Support  - 1 x daily - 4 x weekly - 2 sets - 10 reps   ASSESSMENT:  CLINICAL IMPRESSION: Patient has met 3/4 long-term goals and has low disability per ODI. Per percentage change, pt has met ODI goal versus raw score change. He has significantly reduced NPRS and gives positive appraisal for current progress with PT. He has previously met positional tolerance goals for standing/sitting. 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 Pt is approaching end-phase of this episode of care. We will continue with  tapered plan at this time to prep for  independent HEP.  Pt will continue to benefit from skilled PT services to address deficits and improve function.   OBJECTIVE IMPAIRMENTS: Abnormal gait, decreased mobility, decreased ROM, decreased strength, hypomobility, impaired flexibility, postural dysfunction, and pain.   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   REHAB POTENTIAL: Good  CLINICAL DECISION MAKING: Evolving/moderate complexity  EVALUATION COMPLEXITY: Moderate   GOALS: Goals reviewed with patient? Yes  SHORT TERM GOALS: Target date: 09/12/2023  Pt will be independent with HEP in order to improve strength and decrease back pain to improve pain-free function at home and work. Baseline: 08/21/23: Baseline HEP initiated.     09/26/23: Pt mostly compliant with HEP, some missed days or decreased frequency with his schedule.   11/04/23: Pt is compliant with HEP.  Goal status: ACHIEVED   LONG TERM GOALS: Target date: 10/10/2023  Patient will have full thoracolumbar AROM without reproduction of pain as needed for reaching items on ground, household chores, bending Baseline: 08/21/23: Motion loss and pain with flexion, motion loss with extension, bilateral lateral flexion, L rotation.    09/26/23: Motion loss and pain with flexion and extension. No notable motion loss or pain in frontal or transverse plane.  11/04/23: Patient has pain with return to neutral from extension and mild motion loss with flexion  11/27/23: Mostly WFL ROM, pain with flexion/extension end-range Goal status: IN PROGRESS/MOSTLY MET  2.   Pt will decrease worst back pain by at least 2 points on the NPRS in order to demonstrate clinically significant reduction in back pain. Baseline: 08/21/23: 9/10    09/26/23: 8/10 at worst over previous week 11/04/23: 2/10 at worst in low back.  Goal status: ACHIEVED  3.  Pt will decrease mODI score by at least 13 points in order demonstrate clinically significant reduction in back pain/disability.       Baseline: 08/21/23: 10/50 = 20%      09/26/23: 11/50 = 22% 11/04/23: 6/50 = 12% 11/27/23: 3/50 = 6% Goal status: ACHIEVED   4.  Pt will tolerate sitting for leisure activity and traveling up to 1 hour without reproduction of pain.  Baseline: 08/21/23: Pain with sitting > 1-2 hr.    09/26/23: Up to 2 hours.  Goal status: ACHIEVED    PLAN: PT FREQUENCY: 1x/week to once every other week  PT DURATION: 4 weeks  PLANNED INTERVENTIONS: Therapeutic exercises, Therapeutic activity, Neuromuscular re-education, Balance training, Gait training, Patient/Family education, Self Care, Joint mobilization, Joint manipulation, Vestibular training, Canalith repositioning, Orthotic/Fit training, DME instructions, Dry Needling, Electrical stimulation, Spinal manipulation, Spinal mobilization, Cryotherapy, Moist heat, Taping, Traction, Ultrasound, Ionotophoresis 4mg /ml Dexamethasone, Manual therapy, and Re-evaluation.  PLAN FOR NEXT SESSION: Continue with thoracolumbar mobility. Manual techniques/DTM along R lower lumbar paraspinal prn. Continue with posterior chain strengthening/gluteal strengthening as tolerated. Progress HEP with successive visits.   Venetia Endo, PT, DPT #[id number]  Venetia ONEIDA Endo, PT 11/27/2023, 2:47 PM

## 2023-12-10 ENCOUNTER — Ambulatory Visit: Payer: Self-pay | Admitting: Physical Therapy

## 2023-12-10 DIAGNOSIS — M5459 Other low back pain: Secondary | ICD-10-CM

## 2023-12-10 DIAGNOSIS — M47816 Spondylosis without myelopathy or radiculopathy, lumbar region: Secondary | ICD-10-CM

## 2023-12-10 DIAGNOSIS — M6281 Muscle weakness (generalized): Secondary | ICD-10-CM

## 2023-12-10 NOTE — Therapy (Unsigned)
 OUTPATIENT PHYSICAL THERAPY TREATMENT   Patient Name: Tyler Glover MRN: [id number] DOB:[birth year], 63 y.o., male Today's Date: 12/10/2023   END OF SESSION:  PT End of Session - 12/10/23 1655     Visit Number 21    Number of Visits 22    PT Start Time 1652    PT Stop Time 1730    PT Time Calculation (min) 38 min    Activity Tolerance Patient tolerated treatment well    Behavior During Therapy WFL for tasks assessed/performed          Past Medical History:  Diagnosis Date   Allergy    Arthritis    Rheumatoid   GERD (gastroesophageal reflux disease)    Hypertension    Past Surgical History:  Procedure Laterality Date   COLONOSCOPY     COLONOSCOPY WITH PROPOFOL  N/A 08/21/2019   Procedure: COLONOSCOPY WITH PROPOFOL ;  Surgeon: Jinny Carmine, MD;  Location: [facility];  Service: Endoscopy;  Laterality: N/A;  priority 4   ESOPHAGOGASTRODUODENOSCOPY (EGD) WITH PROPOFOL  N/A 08/21/2019   Procedure: ESOPHAGOGASTRODUODENOSCOPY (EGD) WITH PROPOFOL ;  Surgeon: Jinny Carmine, MD;  Location: [facility];  Service: Endoscopy;  Laterality: N/A;   POLYPECTOMY  08/21/2019   Procedure: POLYPECTOMY;  Surgeon: Jinny Carmine, MD;  Location: [facility];  Service: Endoscopy;;   SHOULDER SURGERY     TESTICLE SURGERY     age 36 or 38   TONSILLECTOMY     Patient Active Problem List   Diagnosis Date Noted   Encounter for screening colonoscopy    Polyp of transverse colon    Heartburn    Pure hypercholesterolemia 01/26/2016   Right lateral epicondylitis 07/07/2015   Vitamin D  deficiency 12/24/2014   Prediabetes 12/24/2014   Allergic rhinitis, seasonal 06/03/2014   Acid reflux 06/03/2014   Essential (primary) hypertension 06/03/2014   Family history of diabetes mellitus 06/03/2014   Family history of cardiac disorder 06/03/2014   Obesity (BMI 30.0-34.9) 06/03/2014    PCP: Joshua Cathryne BROCKS, MD (Inactive)  REFERRING PROVIDER: Mayur Loree Blanch, MD,  PharmD  REFERRING DIAG: [phone] Lumbar spondylosis, M54.9 Back pain  RATIONALE FOR EVALUATION AND TREATMENT: Rehabilitation  THERAPY DIAG: Other low back pain  Lumbar spondylosis  Muscle weakness (generalized)  ONSET DATE: May 2025 (worsening symptoms, acute on chronic flare-up)  FOLLOW-UP APPT SCHEDULED WITH REFERRING PROVIDER: None currently in EMR  PERTINENT HISTORY: Pt is a 63 year old male with Parkinson's disease who ambulates at community-level without AD. Pt has new referral for lumbar spondylosis/LBP. Pt is well known to this clinic and has been attending PT with focus on gait/balance/strengthening.   Patient is s/p ESI at R L4-L5/L5-S1 neuroforamen. Pt reports acute flare-up of back pain in May of this year with worsening pain and R-sided symptoms primarily. Pt reports attempting to care for it on his own. He reports dealing with it most of this year prior to that. He feels that injections have helped. He state she still has pain along R side. Pt reports pain in R flank/R lower lumbar paraspinal region. Pt reports previous episode of L-sided sciatica - not present now. He reports no current paresthesias/numbness. Patient reports intermittent disturbed sleep due to low back.   Previous episode of care focused on gait, balance, and LLE weakness within context of Parkinson's disease. Pt met goals for outcome measures. He would still like to work on improving LLE strength and foot clearance.   PAIN:    Pain Intensity: Present: 4-6/10, Best: 0/10, Worst: 9/10 Pain  location: R lower lumbar paraspinal/R flank Pain Quality: sharp  Radiating: No  Numbness/Tingling: No Focal Weakness: Yes; weakness in LLE due to neurological deficits; no R-sided weakness per pt.  Aggravating factors: leaning over countertop/desktop, bending down to pick up items, prolonged sitting > 1-2 hours Relieving factors: Double knee to chest, heat, ice, Salonpas patch, Tylenol  24-hour pain behavior: worse late  in the day  How long can you sit: painful after 1-2 hours History of prior back injury, pain, surgery, or therapy: Yes; Hx of episodic back pain managed with chiropractic and medical management/injections  Dominant hand: right Imaging: Yes ;  CLINICAL DATA:  Low back pain progressively worsening. Right thigh numbness   EXAM: MRI LUMBAR SPINE WITHOUT CONTRAST   TECHNIQUE: Multiplanar, multisequence MR imaging of the lumbar spine was performed. No intravenous contrast was administered.   COMPARISON:  None Available.   FINDINGS: Segmentation:  Standard.   Alignment:  2 mm retrolisthesis of L2 on L3.   Vertebrae: No acute fracture, evidence of discitis, or aggressive bone lesion.   Conus medullaris and cauda equina: Conus extends to the T12-L1 level. Conus and cauda equina appear normal.   Paraspinal and other soft tissues: No acute paraspinal abnormality.   Disc levels:   Disc spaces: Degenerative disease with disc height loss at L5-S1. Mild disc height loss at L1-2. Disc desiccation at L1-2, L2-3 and L3-4.   T11-12: Mild broad-based disc bulge. No foraminal or central canal stenosis.   T12-L1: No significant disc bulge. No neural foraminal stenosis. No central canal stenosis. Mild bilateral facet arthropathy.   L1-L2: Mild broad-based disc bulge. Mild bilateral facet arthropathy. No foraminal or central canal stenosis.   L2-L3: Broad-based disc bulge. Mild bilateral facet arthropathy. Bilateral lateral recess narrowing. Minimal spinal stenosis. No foraminal stenosis.   L3-L4: Mild broad-based disc bulge. Mild bilateral facet arthropathy. No foraminal or central canal stenosis.   L4-L5: No significant disc bulge. No neural foraminal stenosis. No central canal stenosis. Mild bilateral facet arthropathy.   L5-S1: Broad-based disc osteophyte complex with a focal prominent right far lateral disc osteophyte complex. No foraminal or central canal stenosis.    IMPRESSION: 1. Lumbar spine spondylosis as described above. 2. No acute osseous injury of the lumbar spine.   Red flags: Negative for bowel/bladder changes, saddle paresthesia, personal history of cancer, h/o spinal tumors, h/o compression fx, h/o abdominal aneurysm, abdominal pain, chills/fever, night sweats, nausea, vomiting, unrelenting pain, first onset of insidious LBP <20 y/o  PRECAUTIONS: None  WEIGHT BEARING RESTRICTIONS: No  FALLS: Has patient fallen in last 6 months? No  Living Environment Lives with: lives with their spouse and mother  Lives in: House/apartment; one step to get into home; stairs to get into home, pt has upstairs bedroom. Pt reports doing fairly well with stairs. 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   Hobbies: Visiting museums, sight-seeing, travel  Patient Goals: Reduced pain, improved movement     OBJECTIVE (data from initial evaluation unless otherwise dated):   Patient Surveys  ODI = 10/50 = 20%  GAIT: Distance walked: 40 ft Assistive device utilized: None Level of assistance: SBA Comments: Forward-flexed posture, dec step length  Posture: Moderate thoracic kyphosis and forward head, slouched posture c Hx of Parkinson's Lumbar lordosis: WNL Iliac crest height: Equal bilaterally Lumbar  lateral shift: Negative  AROM AROM (Normal range in degrees) AROM  08/21/23 AROM 09/26/23 AROM 11/04/23 AROM 11/27/23 AROM 12/10/23  Lumbar       Flexion (65) 75%* 75% (down to ankles), dull pain R flank unchanged 75% WFL (down to ankles), mild discomfort axial low back WNL (mild tightness)  Extension (30) 75%* 75%* WNL (pain with return to neutral) WNL (mild discomfort axial low back) WNL (mild discomfort R flank)  Right lateral flexion (25) 75%  WNL WNL WNL WNL  Left lateral flexion (25) 75% WNL (tightness R paraspinals) WNL WNL WNL  Right rotation (30) 100% WNL WNL WNL WNL  Left rotation (30) 50% WNL WNL WNL WNL          Hip Right Left      Flexion (125) WNL WNL      Extension (15)        Abduction (40)        Adduction         Internal Rotation (45)        External Rotation (45)                (* = pain; Blank rows = not tested)  LE MMT: MMT (out of 5) Right 08/21/23 Left 08/21/23  Hip flexion 5 5  Hip extension    Hip abduction    Hip adduction    Hip internal rotation    Hip external rotation    Knee flexion 5 5  Knee extension 5 5-  Ankle dorsiflexion 4 4  Ankle plantarflexion    Ankle inversion    Ankle eversion    (* = pain; Blank rows = not tested)  Sensation Grossly intact to light touch throughout bilateral LEs as determined by testing dermatomes L2-S2. Proprioception, stereognosis, and hot/cold testing deferred on this date.  Reflexes R/L Knee Jerk (L3/4): 2+/2+  Ankle Jerk (S1/2): Unable to obtain  Clonus: Negative bilat   Muscle Length Hamstrings: R: Positive L: Positive [place] (quadriceps): R: Positive L: Positive  Palpation Location Right Left         Lumbar paraspinals 2   Quadratus Lumborum 1   Gluteus Maximus 1   Gluteus Medius 0   Deep hip external rotators 0   PSIS 0   Fortin's Area (SIJ) 0   Greater Trochanter    (Blank rows = not tested) Graded on 0-4 scale (0 = no pain, 1 = pain, 2 = pain with wincing/grimacing/flinching, 3 = pain with withdrawal, 4 = unwilling to allow palpation)  Special Tests Lumbar Radiculopathy and Discogenic: Centralization and Peripheralization (SN 92, -LR 0.12): Not examined.  Slump (SN 83, -LR 0.32): R: Negative L: Negative SLR (SN 92, -LR 0.29): R: Negative L:  Negative   Facet Joint: Extension-Rotation (SN 100, -LR 0.0): R: Negative L: Negative  Lumbar Foraminal Stenosis: Lumbar quadrant (SN 70): R: Negative L: Negative  Hip: FABER (SN 81):  R: Negative (pain with release of FABER on R;  L: Negative   Passive Accessory Intervertebral Motion Pain at restriction L3-5 CPA, moderate hypomobility of lower lumbar spine.      TODAY'S TREATMENT: DATE: 12/10/2023   SUBJECTIVE STATEMENT:   Patient reports minimal pain at arrival. Patient reports more pain at work with bending at waist for too long. 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 Patient reports some discomfort with yawning while completing extension exercise and potentially moving into extension more than he normally would. Pt feels that his pain has  been generally manageable since last follow-up.    Therapeutic Exercise - for improved soft tissue flexibility and extensibility as needed for ROM, improved strength as needed to improve performance of CKC activities/functional movements, MDT/repeated movement for symptom modulation  AROM screen (see chart above)  Hip EXT MMT: R 4+/5, L 4+/5  Lower trunk rotations, hooklying; 1 x 10 alt R/L, 3 sec hold    PATIENT EDUCATION: Discussed current progress made, goals met, and current discharge plan. Pt educated on MDT maintenance phase/modifying frequency following month of minimal to no symptoms.     *not today* Dying bug; 1 x 10 alternating R/L, starting from hooklying  -discussed progression of dying bug to include maintaining LE hovering through movement Bridge; 2 x 10 Repeated extension in lying, 2 x 10: no effect during, better after  Open book; 1 x 10 R and L side LTR with Silver physioball; 1 x 15 alt R/L Hooklying pelvic tilt, anterior>posterior; 2 x 10 Supine piriformis stretch; 2 x 30 sec  3 way kick, standing; x 10 ea dir, bilat  -Red Tband around distal shins  Lower trunk rotations; with QL bias; x 10 (LLE crossed to stretch R QL today), 3 sec hold Hooklying lumbar traction, 10-sec intermittent holds; x 6 minutes, PT at foot of treatment table     Manual Therapy - for  symptom modulation, soft tissue sensitivity and mobility, joint mobility, ROM   STM/DTM and IASTM along R longissimus/iliocostalis lumborum L3-S1 and R gluteal musculature; x 15 minutes CPA ; 3 x 30 sec bouts, gr III for joint mobility L3-L5   *not today* General manual lumbar traction in hooklying with Mulligan belt; therapist at foot of patient; 10 sec on, 10 sec off x 5 minutes for nerve root decompression, pain control Generalized thoracic and rib mobilization in prone lying, gr II; T7-T12      PATIENT EDUCATION:  Education details: see above for patient education details Person educated: Patient Education method: Explanation, Demonstration, and Handouts Education comprehension: verbalized understanding and returned demonstration   HOME EXERCISE PROGRAM:  Access Code: [email or web address] URL: [email or web address] Date: 11/27/2023 Prepared by: Venetia Endo  Exercises - Prone Press Up  - 5-6 x daily - 7 x weekly - 1 sets - 10 reps - 1sec hold - Supine Quadratus Lumborum Stretch  - 2 x daily - 7 x weekly - 2 sets - 10 reps - 2-3sec hold - Seated Hamstring Stretch  - 2 x daily - 7 x weekly - 3 sets - 30sec hold - Supine Piriformis Stretch with Foot on Ground  - 2 x daily - 7 x weekly - 3 sets - 30sec hold - Supine Bridge  - 1 x daily - 4 x weekly - 2 sets - 10 reps - Supine Dead Bug with Leg Extension  - 1 x daily - 4 x weekly - 2 sets - 10 reps - Sidelying Thoracic Rotation with Open Book  - 1 x daily - 4 x weekly - 2 sets - 10 reps - Sit to Stand Without Arm Support  - 1 x daily - 4 x weekly - 2 sets - 10 reps   ASSESSMENT:  CLINICAL IMPRESSION: Patient has met almost all established goals and has markedly improved tolerance to thoracolumbar AROM and only fleeting mild discomfort with end-range flexion/extension. He feels that his pain has remained manageable since last follow-up, though he does have challenges with physical demands of teaching automotive course. Pt  has Parkinson's disease and reports some increasing neurological  symptoms/gait challenges related to this, but no recent falls. Pt may initiate new episode of care focused on gait/balance/strengthening if his neurological condition worsens. We will continue with tapered plan at this time to prep for independent HEP for current case. Pt will continue to benefit from skilled PT services to address deficits and improve function.   OBJECTIVE IMPAIRMENTS: Abnormal gait, decreased mobility, decreased ROM, decreased strength, hypomobility, impaired flexibility, postural dysfunction, and pain.   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   REHAB POTENTIAL: Good  CLINICAL DECISION MAKING: Evolving/moderate complexity  EVALUATION COMPLEXITY: Moderate   GOALS: Goals reviewed with patient? Yes  SHORT TERM GOALS: Target date: 09/12/2023  Pt will be independent with HEP in order to improve strength and decrease back pain to improve pain-free function at home and work. Baseline: 08/21/23: Baseline HEP initiated.     09/26/23: Pt mostly compliant with HEP, some missed days or decreased frequency with his schedule.   11/04/23: Pt is compliant with HEP.  Goal status: ACHIEVED   LONG TERM GOALS: Target date: 10/10/2023  Patient will have full thoracolumbar AROM without reproduction of pain as needed for reaching items on ground, household chores, bending Baseline: 08/21/23: Motion loss and pain with flexion, motion loss with extension, bilateral lateral flexion, L rotation.    09/26/23: Motion loss and pain with flexion and  extension. No notable motion loss or pain in frontal or transverse plane.  11/04/23: Patient has pain with return to neutral from extension and mild motion loss with flexion  11/27/23: Mostly WFL ROM, pain with flexion/extension end-range 12/10/23: Pain with extension end-range.  Goal status: IN PROGRESS/MOSTLY MET  2.  Pt will decrease worst back pain by at least 2 points on the NPRS in order to demonstrate clinically significant reduction in back pain. Baseline: 08/21/23: 9/10    09/26/23: 8/10 at worst over previous week 11/04/23: 2/10 at worst in low back.  Goal status: ACHIEVED  3.  Pt will decrease mODI score by at least 13 points in order demonstrate clinically significant reduction in back pain/disability.       Baseline: 08/21/23: 10/50 = 20%      09/26/23: 11/50 = 22% 11/04/23: 6/50 = 12% 11/27/23: 3/50 = 6% Goal status: ACHIEVED   4.  Pt will tolerate sitting for leisure activity and traveling up to 1 hour without reproduction of pain.  Baseline: 08/21/23: Pain with sitting > 1-2 hr.    09/26/23: Up to 2 hours.  Goal status: ACHIEVED    PLAN: PT FREQUENCY: 1x/week to once every other week  PT DURATION: 4 weeks  PLANNED INTERVENTIONS: Therapeutic exercises, Therapeutic activity, Neuromuscular re-education, Balance training, Gait training, Patient/Family education, Self Care, Joint mobilization, Joint manipulation, Vestibular training, Canalith repositioning, Orthotic/Fit training, DME instructions, Dry Needling, Electrical stimulation, Spinal manipulation, Spinal mobilization, Cryotherapy, Moist heat, Taping, Traction, Ultrasound, Ionotophoresis 4mg /ml Dexamethasone, Manual therapy, and Re-evaluation.  PLAN FOR NEXT SESSION: Continue with thoracolumbar mobility. Manual techniques/DTM along R lower lumbar paraspinal prn. Continue with posterior chain strengthening/gluteal strengthening as tolerated. Progress HEP with successive visits; prep for discharge/independent management.   Venetia Endo, PT, DPT #[id number]  Venetia ONEIDA Endo, PT 12/10/2023, 4:55 PM

## 2023-12-12 ENCOUNTER — Encounter: Payer: Self-pay | Admitting: Physical Therapy

## 2023-12-16 ENCOUNTER — Ambulatory Visit: Payer: Self-pay | Attending: Rheumatology | Admitting: Physical Therapy

## 2023-12-16 ENCOUNTER — Encounter: Payer: Self-pay | Admitting: Physical Therapy

## 2023-12-16 DIAGNOSIS — M47816 Spondylosis without myelopathy or radiculopathy, lumbar region: Secondary | ICD-10-CM | POA: Insufficient documentation

## 2023-12-16 DIAGNOSIS — M6281 Muscle weakness (generalized): Secondary | ICD-10-CM | POA: Insufficient documentation

## 2023-12-16 DIAGNOSIS — M5459 Other low back pain: Secondary | ICD-10-CM | POA: Diagnosis present

## 2023-12-16 NOTE — Therapy (Unsigned)
 OUTPATIENT PHYSICAL THERAPY TREATMENT/DISCHARGE SUMMARY   Patient Name: Tyler Glover MRN: [id number] DOB:06-26-1960, 63 y.o., male Today's Date: 12/16/2023   END OF SESSION:  PT End of Session - 12/16/23 1729     Visit Number 22    Number of Visits 23    PT Start Time 1723    PT Stop Time 1758    PT Time Calculation (min) 35 min    Activity Tolerance Patient tolerated treatment well    Behavior During Therapy WFL for tasks assessed/performed           Past Medical History:  Diagnosis Date   Allergy    Arthritis    Rheumatoid   GERD (gastroesophageal reflux disease)    Hypertension    Past Surgical History:  Procedure Laterality Date   COLONOSCOPY     COLONOSCOPY WITH PROPOFOL  N/A 08/21/2019   Procedure: COLONOSCOPY WITH PROPOFOL ;  Surgeon: Jinny Carmine, MD;  Location: [facility];  Service: Endoscopy;  Laterality: N/A;  priority 4   ESOPHAGOGASTRODUODENOSCOPY (EGD) WITH PROPOFOL  N/A 08/21/2019   Procedure: ESOPHAGOGASTRODUODENOSCOPY (EGD) WITH PROPOFOL ;  Surgeon: Jinny Carmine, MD;  Location: [facility];  Service: Endoscopy;  Laterality: N/A;   POLYPECTOMY  08/21/2019   Procedure: POLYPECTOMY;  Surgeon: Jinny Carmine, MD;  Location: [facility];  Service: Endoscopy;;   SHOULDER SURGERY     TESTICLE SURGERY     age 3 or 39   TONSILLECTOMY     Patient Active Problem List   Diagnosis Date Noted   Encounter for screening colonoscopy    Polyp of transverse colon    Heartburn    Pure hypercholesterolemia 01/26/2016   Right lateral epicondylitis 07/07/2015   Vitamin D  deficiency 12/24/2014   Prediabetes 12/24/2014   Allergic rhinitis, seasonal 06/03/2014   Acid reflux 06/03/2014   Essential (primary) hypertension 06/03/2014   Family history of diabetes mellitus 06/03/2014   Family history of cardiac disorder 06/03/2014   Obesity (BMI 30.0-34.9) 06/03/2014    PCP: Joshua Cathryne BROCKS, MD (Inactive)  REFERRING PROVIDER: Mayur Loree Blanch,  MD, PharmD  REFERRING DIAG: [phone] Lumbar spondylosis, M54.9 Back pain  RATIONALE FOR EVALUATION AND TREATMENT: Rehabilitation  THERAPY DIAG: Other low back pain  Lumbar spondylosis  Muscle weakness (generalized)  ONSET DATE: May 2025 (worsening symptoms, acute on chronic flare-up)  FOLLOW-UP APPT SCHEDULED WITH REFERRING PROVIDER: None currently in EMR  PERTINENT HISTORY: Pt is a 63 year old male with Parkinson's disease who ambulates at community-level without AD. Pt has new referral for lumbar spondylosis/LBP. Pt is well known to this clinic and has been attending PT with focus on gait/balance/strengthening.   Patient is s/p ESI at R L4-L5/L5-S1 neuroforamen. Pt reports acute flare-up of back pain in May of this year with worsening pain and R-sided symptoms primarily. Pt reports attempting to care for it on his own. He reports dealing with it most of this year prior to that. He feels that injections have helped. He state she still has pain along R side. Pt reports pain in R flank/R lower lumbar paraspinal region. Pt reports previous episode of L-sided sciatica - not present now. He reports no current paresthesias/numbness. Patient reports intermittent disturbed sleep due to low back.   Previous episode of care focused on gait, balance, and LLE weakness within context of Parkinson's disease. Pt met goals for outcome measures. He would still like to work on improving LLE strength and foot clearance.   PAIN:    Pain Intensity: Present: 4-6/10, Best: 0/10, Worst:  9/10 Pain location: R lower lumbar paraspinal/R flank Pain Quality: sharp  Radiating: No  Numbness/Tingling: No Focal Weakness: Yes; weakness in LLE due to neurological deficits; no R-sided weakness per pt.  Aggravating factors: leaning over countertop/desktop, bending down to pick up items, prolonged sitting > 1-2 hours Relieving factors: Double knee to chest, heat, ice, Salonpas patch, Tylenol  24-hour pain behavior: worse  late in the day  How long can you sit: painful after 1-2 hours History of prior back injury, pain, surgery, or therapy: Yes; Hx of episodic back pain managed with chiropractic and medical management/injections  Dominant hand: right Imaging: Yes ;  CLINICAL DATA:  Low back pain progressively worsening. Right thigh numbness   EXAM: MRI LUMBAR SPINE WITHOUT CONTRAST   TECHNIQUE: Multiplanar, multisequence MR imaging of the lumbar spine was performed. No intravenous contrast was administered.   COMPARISON:  None Available.   FINDINGS: Segmentation:  Standard.   Alignment:  2 mm retrolisthesis of L2 on L3.   Vertebrae: No acute fracture, evidence of discitis, or aggressive bone lesion.   Conus medullaris and cauda equina: Conus extends to the T12-L1 level. Conus and cauda equina appear normal.   Paraspinal and other soft tissues: No acute paraspinal abnormality.   Disc levels:   Disc spaces: Degenerative disease with disc height loss at L5-S1. Mild disc height loss at L1-2. Disc desiccation at L1-2, L2-3 and L3-4.   T11-12: Mild broad-based disc bulge. No foraminal or central canal stenosis.   T12-L1: No significant disc bulge. No neural foraminal stenosis. No central canal stenosis. Mild bilateral facet arthropathy.   L1-L2: Mild broad-based disc bulge. Mild bilateral facet arthropathy. No foraminal or central canal stenosis.   L2-L3: Broad-based disc bulge. Mild bilateral facet arthropathy. Bilateral lateral recess narrowing. Minimal spinal stenosis. No foraminal stenosis.   L3-L4: Mild broad-based disc bulge. Mild bilateral facet arthropathy. No foraminal or central canal stenosis.   L4-L5: No significant disc bulge. No neural foraminal stenosis. No central canal stenosis. Mild bilateral facet arthropathy.   L5-S1: Broad-based disc osteophyte complex with a focal prominent right far lateral disc osteophyte complex. No foraminal or central canal stenosis.    IMPRESSION: 1. Lumbar spine spondylosis as described above. 2. No acute osseous injury of the lumbar spine.   Red flags: Negative for bowel/bladder changes, saddle paresthesia, personal history of cancer, h/o spinal tumors, h/o compression fx, h/o abdominal aneurysm, abdominal pain, chills/fever, night sweats, nausea, vomiting, unrelenting pain, first onset of insidious LBP <20 y/o  PRECAUTIONS: None  WEIGHT BEARING RESTRICTIONS: No  FALLS: Has patient fallen in last 6 months? No  Living Environment Lives with: lives with their spouse and mother  Lives in: House/apartment; one step to get into home; stairs to get into home, pt has upstairs bedroom. Pt reports doing fairly well with stairs. 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   Hobbies: Visiting museums, sight-seeing, travel  Patient Goals: Reduced pain, improved movement     OBJECTIVE (data from initial evaluation unless otherwise dated):   Patient Surveys  ODI = 10/50 = 20%  GAIT: Distance walked: 40 ft Assistive device utilized: None Level of assistance: SBA Comments: Forward-flexed posture, dec step length  Posture: Moderate thoracic kyphosis and forward head, slouched posture c Hx of Parkinson's Lumbar lordosis: WNL Iliac crest height: Equal  bilaterally Lumbar lateral shift: Negative  AROM AROM (Normal range in degrees) AROM  08/21/23 AROM 09/26/23 AROM 11/04/23 AROM 11/27/23 AROM 12/10/23  Lumbar       Flexion (65) 75%* 75% (down to ankles), dull pain R flank unchanged 75% WFL (down to ankles), mild discomfort axial low back WNL (mild tightness)  Extension (30) 75%* 75%* WNL (pain with return to neutral) WNL (mild discomfort axial low back) WNL (mild discomfort R flank)  Right lateral flexion (25) 75%  WNL WNL WNL WNL  Left lateral flexion (25) 75% WNL (tightness R paraspinals) WNL WNL WNL  Right rotation (30) 100% WNL WNL WNL WNL  Left rotation (30) 50% WNL WNL WNL WNL          Hip Right Left      Flexion (125) WNL WNL      Extension (15)        Abduction (40)        Adduction         Internal Rotation (45)        External Rotation (45)                (* = pain; Blank rows = not tested)  LE MMT: MMT (out of 5) Right 08/21/23 Left 08/21/23  Hip flexion 5 5  Hip extension    Hip abduction    Hip adduction    Hip internal rotation    Hip external rotation    Knee flexion 5 5  Knee extension 5 5-  Ankle dorsiflexion 4 4  Ankle plantarflexion    Ankle inversion    Ankle eversion    (* = pain; Blank rows = not tested)  Sensation Grossly intact to light touch throughout bilateral LEs as determined by testing dermatomes L2-S2. Proprioception, stereognosis, and hot/cold testing deferred on this date.  Reflexes R/L Knee Jerk (L3/4): 2+/2+  Ankle Jerk (S1/2): Unable to obtain  Clonus: Negative bilat   Muscle Length Hamstrings: R: Positive L: Positive Ely (quadriceps): R: Positive L: Positive  Palpation Location Right Left         Lumbar paraspinals 2   Quadratus Lumborum 1   Gluteus Maximus 1   Gluteus Medius 0   Deep hip external rotators 0   PSIS 0   Fortin's Area (SIJ) 0   Greater Trochanter    (Blank rows = not tested) Graded on 0-4 scale (0 = no pain, 1 = pain, 2 = pain with wincing/grimacing/flinching, 3 = pain with withdrawal, 4 = unwilling to allow palpation)  Special Tests Lumbar Radiculopathy and Discogenic: Centralization and Peripheralization (SN 92, -LR 0.12): Not examined.  Slump (SN 83, -LR 0.32): R: Negative L: Negative SLR (SN 92, -LR 0.29): R: Negative L:  Negative   Facet Joint: Extension-Rotation (SN 100, -LR 0.0): R: Negative L: Negative  Lumbar Foraminal Stenosis: Lumbar quadrant (SN 70): R: Negative L: Negative  Hip: FABER (SN 81):  R: Negative (pain with release of FABER on R;  L: Negative   Passive Accessory Intervertebral Motion Pain at restriction L3-5 CPA, moderate hypomobility of lower lumbar spine.      TODAY'S TREATMENT: DATE: 12/16/2023   SUBJECTIVE STATEMENT:   Patient reports that he has been maintaining his progress relatively well. He reports some difficulties with sleeping over last 2 nights. Patient reports more pain in early AM hours 3-5 AM the last few nights. Patient reports nearing readiness for IND HEP/self-management.    Therapeutic Exercise - for improved soft tissue flexibility  and extensibility as needed for ROM, improved strength as needed to improve performance of CKC activities/functional movements, MDT/repeated movement for symptom modulation  NuStep; Level 5, x 5.5 minutes - for improved soft tissue mobility and increased tissue temperature to improve muscle performance   -subjective gathered during this time   AROM Lumbar flexion WNL Lumbar extension WNL (1/10 R flank) Lateral flexion: R WNL , L WNL (1/10 R flank) Thoracolumbar rotation: R WNL , L WNL  Repeated extension in lying, 1 x 10: (mild discomfort right flank during, 1/10), better after   Open book; 1 x 10 R and L side  Dying bug; 1 x 10 alternating R/L, starting from hooklying  Lower trunk rotations, hooklying; 1 x 10 alt R/L, 3 sec hold   PATIENT EDUCATION: Discussed current progress made, goals met, and current discharge plan. Pt educated on MDT maintenance phase/modifying frequency following month of minimal to no symptoms.     *not today* Bridge; 2 x 10 LTR with Silver physioball; 1 x 15 alt R/L Hooklying pelvic tilt, anterior>posterior; 2 x 10 Supine piriformis stretch; 2 x 30 sec  3 way kick, standing; x 10 ea dir, bilat  -Red Tband around distal shins  Lower trunk rotations; with QL bias; x 10 (LLE crossed to stretch R QL today), 3 sec hold Hooklying lumbar traction, 10-sec intermittent holds; x 6  minutes, PT at foot of treatment table     Manual Therapy - for symptom modulation, soft tissue sensitivity and mobility, joint mobility, ROM   STM/DTM and IASTM along R longissimus/iliocostalis lumborum L3-S1 and R gluteal musculature; x 15 minutes CPA ; 3 x 30 sec bouts, gr III for joint mobility L3-L5   *not today* General manual lumbar traction in hooklying with Mulligan belt; therapist at foot of patient; 10 sec on, 10 sec off x 5 minutes for nerve root decompression, pain control Generalized thoracic and rib mobilization in prone lying, gr II; T7-T12      PATIENT EDUCATION:  Education details: see above for patient education details Person educated: Patient Education method: Explanation, Demonstration, and Handouts Education comprehension: verbalized understanding and returned demonstration   HOME EXERCISE PROGRAM:  Access Code: [email or web address] URL: [email or web address] Date: 11/27/2023 Prepared by: Venetia Endo  Exercises - Prone Press Up  - 5-6 x daily - 7 x weekly - 1 sets - 10 reps - 1sec hold - Supine Quadratus Lumborum Stretch  - 2 x daily - 7 x weekly - 2 sets - 10 reps - 2-3sec hold - Seated Hamstring Stretch  - 2 x daily - 7 x weekly - 3 sets - 30sec hold - Supine Piriformis Stretch with Foot on Ground  - 2 x daily - 7 x weekly - 3 sets - 30sec hold - Supine Bridge  - 1 x daily - 4 x weekly - 2 sets - 10 reps - Supine Dead Bug with Leg Extension  - 1 x daily - 4 x weekly - 2 sets - 10 reps - Sidelying Thoracic Rotation with Open Book  - 1 x daily - 4 x weekly - 2 sets - 10 reps - Sit to Stand Without Arm Support  - 1 x daily - 4 x weekly - 2 sets - 10 reps   ASSESSMENT:  CLINICAL IMPRESSION: Patient has met almost all established goals and has markedly improved tolerance to thoracolumbar AROM and only fleeting mild discomfort with end-range flexion/extension. He feels that his pain has remained manageable since last follow-up, though he does  have challenges with physical demands of teaching automotive course. Pt has Parkinson's disease and reports some increasing neurological symptoms/gait challenges related to this, but no recent falls. Pt may initiate new episode of care focused on gait/balance/strengthening if his neurological condition worsens. We will continue with tapered plan at this time to prep for independent HEP for current case. Pt will continue to benefit from skilled PT services to address deficits and improve function.   OBJECTIVE IMPAIRMENTS: Abnormal gait, decreased mobility, decreased ROM, decreased strength, hypomobility, impaired flexibility, postural dysfunction, and pain.   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   REHAB POTENTIAL: Good  CLINICAL DECISION MAKING: Evolving/moderate complexity  EVALUATION COMPLEXITY: Moderate   GOALS: Goals reviewed with patient? Yes  SHORT TERM GOALS: Target date: 09/12/2023  Pt will be independent with HEP in order to improve strength and decrease back pain to improve pain-free function at home and work. Baseline: 08/21/23: Baseline HEP initiated.     09/26/23: Pt mostly compliant with HEP, some missed days or decreased frequency with his schedule.   11/04/23: Pt is compliant with HEP.  Goal status: ACHIEVED   LONG TERM GOALS: Target date: 10/10/2023  Patient will have full thoracolumbar AROM without reproduction of pain as needed for reaching items on ground, household chores, bending Baseline: 08/21/23: Motion loss and pain with flexion, motion loss with extension, bilateral lateral  flexion, L rotation.    09/26/23: Motion loss and pain with flexion and extension. No notable motion loss or pain in frontal or transverse plane.  11/04/23: Patient has pain with return to neutral from extension and mild motion loss with flexion  11/27/23: Mostly WFL ROM, pain with flexion/extension end-range 12/10/23: Pain with extension end-range.  Goal status: IN PROGRESS/MOSTLY MET  2.  Pt will decrease worst back pain by at least 2 points on the NPRS in order to demonstrate clinically significant reduction in back pain. Baseline: 08/21/23: 9/10    09/26/23: 8/10 at worst over previous week 11/04/23: 2/10 at worst in low back.  Goal status: ACHIEVED  3.  Pt will decrease mODI score by at least 13 points in order demonstrate clinically significant reduction in back pain/disability.       Baseline: 08/21/23: 10/50 = 20%      09/26/23: 11/50 = 22% 11/04/23: 6/50 = 12% 11/27/23: 3/50 = 6% Goal status: ACHIEVED   4.  Pt will tolerate sitting for leisure activity and traveling up to 1 hour without reproduction of pain.  Baseline: 08/21/23: Pain with sitting > 1-2 hr.    09/26/23: Up to 2 hours.  Goal status: ACHIEVED    PLAN: PT FREQUENCY: 1x/week to once every other week  PT DURATION: 4 weeks  PLANNED INTERVENTIONS: Therapeutic exercises, Therapeutic activity, Neuromuscular re-education, Balance training, Gait training, Patient/Family education, Self Care, Joint mobilization, Joint manipulation, Vestibular training, Canalith repositioning, Orthotic/Fit training, DME instructions, Dry Needling, Electrical stimulation, Spinal manipulation, Spinal mobilization, Cryotherapy, Moist heat, Taping, Traction, Ultrasound, Ionotophoresis 4mg /ml Dexamethasone, Manual therapy, and Re-evaluation.  PLAN FOR NEXT SESSION: Continue with thoracolumbar mobility. Manual techniques/DTM along R lower lumbar paraspinal prn. Continue with posterior chain strengthening/gluteal strengthening as tolerated. Progress HEP with  successive visits; prep for discharge/independent management.   Venetia Endo, PT, DPT #[id number]  Venetia DASEN  Andra, PT 12/16/2023, 5:29 PM

## 2023-12-26 ENCOUNTER — Encounter: Admitting: Family Medicine

## 2024-01-06 ENCOUNTER — Other Ambulatory Visit: Payer: Self-pay

## 2024-01-06 DIAGNOSIS — H6993 Unspecified Eustachian tube disorder, bilateral: Secondary | ICD-10-CM

## 2024-01-06 MED ORDER — MONTELUKAST SODIUM 10 MG PO TABS
ORAL_TABLET | ORAL | 5 refills | Status: DC
Start: 2024-01-06 — End: 2024-01-07

## 2024-01-07 ENCOUNTER — Encounter: Payer: Self-pay | Admitting: Family Medicine

## 2024-01-07 ENCOUNTER — Ambulatory Visit (INDEPENDENT_AMBULATORY_CARE_PROVIDER_SITE_OTHER): Admitting: Family Medicine

## 2024-01-07 ENCOUNTER — Other Ambulatory Visit: Payer: Self-pay | Admitting: Family Medicine

## 2024-01-07 VITALS — BP 148/90 | HR 81 | Ht 70.0 in | Wt 226.0 lb

## 2024-01-07 DIAGNOSIS — I10 Essential (primary) hypertension: Secondary | ICD-10-CM

## 2024-01-07 DIAGNOSIS — Z23 Encounter for immunization: Secondary | ICD-10-CM | POA: Diagnosis not present

## 2024-01-07 DIAGNOSIS — J301 Allergic rhinitis due to pollen: Secondary | ICD-10-CM

## 2024-01-07 DIAGNOSIS — K219 Gastro-esophageal reflux disease without esophagitis: Secondary | ICD-10-CM | POA: Diagnosis not present

## 2024-01-07 DIAGNOSIS — M0579 Rheumatoid arthritis with rheumatoid factor of multiple sites without organ or systems involvement: Secondary | ICD-10-CM | POA: Diagnosis not present

## 2024-01-07 DIAGNOSIS — M26649 Arthritis of unspecified temporomandibular joint: Secondary | ICD-10-CM

## 2024-01-07 DIAGNOSIS — G20B1 Parkinson's disease with dyskinesia, without mention of fluctuations: Secondary | ICD-10-CM | POA: Diagnosis not present

## 2024-01-07 DIAGNOSIS — E7800 Pure hypercholesterolemia, unspecified: Secondary | ICD-10-CM

## 2024-01-07 DIAGNOSIS — H6993 Unspecified Eustachian tube disorder, bilateral: Secondary | ICD-10-CM

## 2024-01-07 MED ORDER — PANTOPRAZOLE SODIUM 40 MG PO TBEC: DELAYED_RELEASE_TABLET | ORAL | 5 refills | Status: AC

## 2024-01-07 MED ORDER — LISINOPRIL 40 MG PO TABS: ORAL_TABLET | ORAL | 5 refills | Status: AC

## 2024-01-07 MED ORDER — MONTELUKAST SODIUM 10 MG PO TABS: ORAL_TABLET | ORAL | 5 refills | Status: AC

## 2024-01-07 MED ORDER — FLUTICASONE PROPIONATE 50 MCG/ACT NA SUSP: NASAL | 0 refills | Status: DC

## 2024-01-07 MED ORDER — ATORVASTATIN CALCIUM 10 MG PO TABS: ORAL_TABLET | ORAL | 5 refills | Status: AC

## 2024-01-07 MED ORDER — PSEUDOEPHEDRINE HCL ER 120 MG PO TB12: ORAL_TABLET | ORAL | 11 refills | Status: AC

## 2024-01-07 NOTE — Progress Notes (Signed)
 Established Patient Office Visit  Patient ID: Tyler Glover, male    DOB: 09-05-1960  Age: 63 y.o. MRN: [id number] PCP: Ercel Pepitone K, MD  Chief Complaint  Patient presents with   Hypertension    Patient presents today for medication refills on HTN medications.     Subjective:     HPI  Discussed the use of AI scribe software for clinical note transcription with the patient, who gave verbal consent to proceed.  History of Present Illness Tyler Glover is a 63 year old male with hypertension and rheumatoid arthritis who presents for a six-month follow-up for blood pressure management.  His blood pressure is currently 148/90 mmHg, although it typically measures around 130/85 mmHg at home using a wrist monitor. He attributes today's elevated reading to stress.  He has a history of Parkinson's disease, diagnosed a couple of years ago, with symptoms including occasional tremors and a shallower gait. He is undergoing physical therapy and sees a neurologist every six months. His current medication for Parkinson's is amantadine 100 mg twice daily.  His rheumatoid arthritis initially affected his hands and now involves his knees and hips. He is managed by a rheumatologist and is on methotrexate, Arnesia infusions every 28 days, and Plaquenil. His last rheumatology visit was in the middle of summer.  He developed a non-itchy red rash on both arms and a bit on his neck, persisting for a few weeks. He has not started any new medications recently and has an upcoming appointment with a dermatologist to evaluate the rash. He suspects it might be related to his current medications.  His medication regimen also includes cholesterol medication, vitamin D , calcium , B12, Flonase  nasal spray, and pseudoephedrine  for allergies and congestion. He is unsure about his use of hydroxychloroquine (Plaquenil) but recalls it being prescribed for joint aches.  He has a family history of lung  cancer on his father's side and heart disease. He is married with grown children and works at [employer].      Review of Systems  All other systems reviewed and are negative.     Objective:     BP (!) 148/90   Pulse 81   Ht 5' 10 (1.778 m)   Wt 226 lb (102.5 kg)   SpO2 97%   BMI 32.43 kg/m     Physical Exam Vitals and nursing note reviewed.  Constitutional:      Appearance: Normal appearance.  HENT:     Head: Normocephalic.     Right Ear: External ear normal.     Left Ear: External ear normal.  Eyes:     Conjunctiva/sclera: Conjunctivae normal.  Cardiovascular:     Rate and Rhythm: Normal rate.  Pulmonary:     Effort: Pulmonary effort is normal. No respiratory distress.  Abdominal:     Palpations: Abdomen is soft.  Musculoskeletal:        General: Normal range of motion.  Skin:    General: Skin is warm.     Findings: Erythema present.  Neurological:     Mental Status: He is alert and oriented to person, place, and time.  Psychiatric:        Mood and Affect: Mood normal.     Physical Exam SKIN: Erythematous rash on both arms and neck.   No results found for any visits on 01/07/24.     The 10-year ASCVD risk score (Arnett DK, et al., 2019) is: 9.9%    Assessment & Plan:  Problem List Items Addressed This Visit       Cardiovascular and Mediastinum   Essential (primary) hypertension - Primary   Relevant Medications   lisinopril  (ZESTRIL ) 40 MG tablet   atorvastatin  (LIPITOR) 10 MG tablet     Respiratory   Allergic rhinitis, seasonal   Relevant Medications   pseudoephedrine  (WAL-PHED 12 HOUR) 120 MG 12 hr tablet   fluticasone  (FLONASE ) 50 MCG/ACT nasal spray     Digestive   Acid reflux   Relevant Medications   pantoprazole  (PROTONIX ) 40 MG tablet   Other Visit Diagnoses       Parkinson's disease with dyskinesia, unspecified whether manifestations fluctuate (HCC)         Rheumatoid arthritis involving multiple sites  with positive rheumatoid factor (HCC)         Arthritis of temporomandibular joint, unspecified laterality         Eustachian tube dysfunction, bilateral       Relevant Medications   pseudoephedrine  (WAL-PHED 12 HOUR) 120 MG 12 hr tablet   montelukast  (SINGULAIR ) 10 MG tablet     Hypercholesterolemia       Relevant Medications   lisinopril  (ZESTRIL ) 40 MG tablet   atorvastatin  (LIPITOR) 10 MG tablet     Encounter for immunization       Relevant Orders   Flu vaccine trivalent PF, 6mos and older(Flulaval,Afluria,Fluarix,Fluzone) (Completed)   Pneumococcal conjugate vaccine 20-valent (Completed)       Assessment and Plan Assessment & Plan Essential hypertension Blood pressure 148/90 mmHg, elevated but not significantly high. Stress may contribute. Home readings around 130/85 mmHg. No medication changes today. - Continue current blood pressure medications. - Monitor blood pressure at home with an arm cuff. - Scheduled follow-up appointment in March for blood pressure evaluation and potential medication adjustment.  Parkinson's disease Diagnosed a couple of years ago. Occasional tremors and shallower gait. Engages in physical therapy. No falls reported. Neurologist visits every six months. - Continue amantadine 100 mg twice daily. - Continue physical therapy. - Continue follow-up with neurologist every six months.  Rheumatoid arthritis with rheumatoid factor, multiple sites Initially in hands, now affecting knees and hips. Managed with methotrexate, Ornesia infusions, and Plaquenil. Recent rash possibly related to medication. Rheumatologist advised dermatology follow-up. - Continue methotrexate, Arnesia infusions, and Plaquenil. - Follow up with dermatologist for rash evaluation.  Gastroesophageal reflux disease Managed with Protonix . - Continue Protonix .  Allergic rhinitis due to pollen Managed with Flonase  nasal spray and pseudoephedrine  for congestion. - Continue Flonase   nasal spray. - Continue pseudoephedrine  as needed for congestion.  Pure hypercholesterolemia Managed with cholesterol medication. - Continue current cholesterol medication.  General Health Maintenance Requested flu and pneumonia vaccines. - Administered flu shot. - Administered pneumonia vaccine.    No follow-ups on file.    Vinary K Robey Massmann, MD [facility] at [facility]

## 2024-01-08 ENCOUNTER — Ambulatory Visit: Payer: Self-pay | Admitting: Physical Therapy

## 2024-01-08 DIAGNOSIS — M5459 Other low back pain: Secondary | ICD-10-CM | POA: Diagnosis not present

## 2024-01-08 DIAGNOSIS — M6281 Muscle weakness (generalized): Secondary | ICD-10-CM

## 2024-01-08 DIAGNOSIS — M47816 Spondylosis without myelopathy or radiculopathy, lumbar region: Secondary | ICD-10-CM

## 2024-01-08 NOTE — Therapy (Signed)
 OUTPATIENT PHYSICAL THERAPY TREATMENT/DISCHARGE SUMMARY   Patient Name: Tyler Glover MRN: [id number] DOB:1960-07-21, 63 y.o., male Today's Date: 01/08/2024   END OF SESSION:  PT End of Session - 01/08/24 1028     Visit Number 23    Number of Visits 23    PT Start Time 1029    PT Stop Time 1108    PT Time Calculation (min) 39 min    Activity Tolerance Patient tolerated treatment well    Behavior During Therapy WFL for tasks assessed/performed          Past Medical History:  Diagnosis Date   Allergy    Arthritis    Rheumatoid   GERD (gastroesophageal reflux disease)    Hypertension    Past Surgical History:  Procedure Laterality Date   COLONOSCOPY     COLONOSCOPY WITH PROPOFOL  N/A 08/21/2019   Procedure: COLONOSCOPY WITH PROPOFOL ;  Surgeon: Jinny Carmine, MD;  Location: [facility];  Service: Endoscopy;  Laterality: N/A;  priority 4   ESOPHAGOGASTRODUODENOSCOPY (EGD) WITH PROPOFOL  N/A 08/21/2019   Procedure: ESOPHAGOGASTRODUODENOSCOPY (EGD) WITH PROPOFOL ;  Surgeon: Jinny Carmine, MD;  Location: [facility];  Service: Endoscopy;  Laterality: N/A;   POLYPECTOMY  08/21/2019   Procedure: POLYPECTOMY;  Surgeon: Jinny Carmine, MD;  Location: [facility];  Service: Endoscopy;;   SHOULDER SURGERY     TESTICLE SURGERY     age 63 or 60   TONSILLECTOMY     Patient Active Problem List   Diagnosis Date Noted   Encounter for screening colonoscopy    Polyp of transverse colon    Heartburn    Pure hypercholesterolemia 01/26/2016   Right lateral epicondylitis 07/07/2015   Vitamin D  deficiency 12/24/2014   Prediabetes 12/24/2014   Allergic rhinitis, seasonal 06/03/2014   Acid reflux 06/03/2014   Essential (primary) hypertension 06/03/2014   Family history of diabetes mellitus 06/03/2014   Family history of cardiac disorder 06/03/2014   Obesity (BMI 30.0-34.9) 06/03/2014    PCP: Kotturi, Vinay K, MD  REFERRING PROVIDER: Mayur Loree Blanch, MD,  PharmD  REFERRING DIAG: [phone] Lumbar spondylosis, M54.9 Back pain  RATIONALE FOR EVALUATION AND TREATMENT: Rehabilitation  THERAPY DIAG: Other low back pain  Lumbar spondylosis  Muscle weakness (generalized)  ONSET DATE: May 2025 (worsening symptoms, acute on chronic flare-up)  FOLLOW-UP APPT SCHEDULED WITH REFERRING PROVIDER: None currently in EMR  PERTINENT HISTORY: Pt is a 63 year old male with Parkinson's disease who ambulates at community-level without AD. Pt has new referral for lumbar spondylosis/LBP. Pt is well known to this clinic and has been attending PT with focus on gait/balance/strengthening.   Patient is s/p ESI at R L4-L5/L5-S1 neuroforamen. Pt reports acute flare-up of back pain in May of this year with worsening pain and R-sided symptoms primarily. Pt reports attempting to care for it on his own. He reports dealing with it most of this year prior to that. He feels that injections have helped. He state she still has pain along R side. Pt reports pain in R flank/R lower lumbar paraspinal region. Pt reports previous episode of L-sided sciatica - not present now. He reports no current paresthesias/numbness. Patient reports intermittent disturbed sleep due to low back.   Previous episode of care focused on gait, balance, and LLE weakness within context of Parkinson's disease. Pt met goals for outcome measures. He would still like to work on improving LLE strength and foot clearance.   PAIN:    Pain Intensity: Present: 4-6/10, Best: 0/10, Worst: 9/10 Pain  location: R lower lumbar paraspinal/R flank Pain Quality: sharp  Radiating: No  Numbness/Tingling: No Focal Weakness: Yes; weakness in LLE due to neurological deficits; no R-sided weakness per pt.  Aggravating factors: leaning over countertop/desktop, bending down to pick up items, prolonged sitting > 1-2 hours Relieving factors: Double knee to chest, heat, ice, Salonpas patch, Tylenol  24-hour pain behavior: worse late  in the day  How long can you sit: painful after 1-2 hours History of prior back injury, pain, surgery, or therapy: Yes; Hx of episodic back pain managed with chiropractic and medical management/injections  Dominant hand: right Imaging: Yes ;  CLINICAL DATA:  Low back pain progressively worsening. Right thigh numbness   EXAM: MRI LUMBAR SPINE WITHOUT CONTRAST   TECHNIQUE: Multiplanar, multisequence MR imaging of the lumbar spine was performed. No intravenous contrast was administered.   COMPARISON:  None Available.   FINDINGS: Segmentation:  Standard.   Alignment:  2 mm retrolisthesis of L2 on L3.   Vertebrae: No acute fracture, evidence of discitis, or aggressive bone lesion.   Conus medullaris and cauda equina: Conus extends to the T12-L1 level. Conus and cauda equina appear normal.   Paraspinal and other soft tissues: No acute paraspinal abnormality.   Disc levels:   Disc spaces: Degenerative disease with disc height loss at L5-S1. Mild disc height loss at L1-2. Disc desiccation at L1-2, L2-3 and L3-4.   T11-12: Mild broad-based disc bulge. No foraminal or central canal stenosis.   T12-L1: No significant disc bulge. No neural foraminal stenosis. No central canal stenosis. Mild bilateral facet arthropathy.   L1-L2: Mild broad-based disc bulge. Mild bilateral facet arthropathy. No foraminal or central canal stenosis.   L2-L3: Broad-based disc bulge. Mild bilateral facet arthropathy. Bilateral lateral recess narrowing. Minimal spinal stenosis. No foraminal stenosis.   L3-L4: Mild broad-based disc bulge. Mild bilateral facet arthropathy. No foraminal or central canal stenosis.   L4-L5: No significant disc bulge. No neural foraminal stenosis. No central canal stenosis. Mild bilateral facet arthropathy.   L5-S1: Broad-based disc osteophyte complex with a focal prominent right far lateral disc osteophyte complex. No foraminal or central canal stenosis.    IMPRESSION: 1. Lumbar spine spondylosis as described above. 2. No acute osseous injury of the lumbar spine.   Red flags: Negative for bowel/bladder changes, saddle paresthesia, personal history of cancer, h/o spinal tumors, h/o compression fx, h/o abdominal aneurysm, abdominal pain, chills/fever, night sweats, nausea, vomiting, unrelenting pain, first onset of insidious LBP <20 y/o  PRECAUTIONS: None  WEIGHT BEARING RESTRICTIONS: No  FALLS: Has patient fallen in last 6 months? No  Living Environment Lives with: lives with their spouse and mother  Lives in: House/apartment; one step to get into home; stairs to get into home, pt has upstairs bedroom. Pt reports doing fairly well with stairs. 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   Hobbies: Visiting museums, sight-seeing, travel  Patient Goals: Reduced pain, improved movement     OBJECTIVE (data from initial evaluation unless otherwise dated):   Patient Surveys  ODI = 10/50 = 20%  GAIT: Distance walked: 40 ft Assistive device utilized: None Level of assistance: SBA Comments: Forward-flexed posture, dec step length  Posture: Moderate thoracic kyphosis and forward head, slouched posture c Hx of Parkinson's Lumbar lordosis: WNL Iliac crest height: Equal bilaterally Lumbar  lateral shift: Negative  AROM AROM (Normal range in degrees) AROM  08/21/23 AROM 11/27/23 AROM 12/10/23 AROM 01/08/24  Lumbar      Flexion (65) 75%* WFL (down to ankles), mild discomfort axial low back WNL (mild tightness) WNL (tight)  Extension (30) 75%* WNL (mild discomfort axial low back) WNL (mild discomfort R flank) WNL (mild twinge R lower paraspinal)  Right lateral flexion (25) 75% WNL WNL WNL  Left lateral flexion (25) 75% WNL WNL  WNL  Right rotation (30) 100% WNL WNL WNL  Left rotation (30) 50% WNL WNL WNL         Hip Right Left     Flexion (125) WNL WNL     Extension (15)       Abduction (40)       Adduction        Internal Rotation (45)       External Rotation (45)              (* = pain; Blank rows = not tested)  LE MMT: MMT (out of 5) Right 08/21/23 Left 08/21/23  Hip flexion 5 5  Hip extension    Hip abduction    Hip adduction    Hip internal rotation    Hip external rotation    Knee flexion 5 5  Knee extension 5 5-  Ankle dorsiflexion 4 4  Ankle plantarflexion    Ankle inversion    Ankle eversion    (* = pain; Blank rows = not tested)  Sensation Grossly intact to light touch throughout bilateral LEs as determined by testing dermatomes L2-S2. Proprioception, stereognosis, and hot/cold testing deferred on this date.  Reflexes R/L Knee Jerk (L3/4): 2+/2+  Ankle Jerk (S1/2): Unable to obtain  Clonus: Negative bilat   Muscle Length Hamstrings: R: Positive L: Positive E[place] (quadriceps): R: Positive L: Positive  Palpation Location Right Left         Lumbar paraspinals 2   Quadratus Lumborum 1   Gluteus Maximus 1   Gluteus Medius 0   Deep hip external rotators 0   PSIS 0   Fortin's Area (SIJ) 0   Greater Trochanter    (Blank rows = not tested) Graded on 0-4 scale (0 = no pain, 1 = pain, 2 = pain with wincing/grimacing/flinching, 3 = pain with withdrawal, 4 = unwilling to allow palpation)  Special Tests Lumbar Radiculopathy and Discogenic: Centralization and Peripheralization (SN 92, -LR 0.12): Not examined.  Slump (SN 83, -LR 0.32): R: Negative L: Negative SLR (SN 92, -LR 0.29): R: Negative L:  Negative   Facet Joint: Extension-Rotation (SN 100, -LR 0.0): R: Negative L: Negative  Lumbar Foraminal Stenosis: Lumbar quadrant (SN 70): R: Negative L: Negative  Hip: FABER (SN 81): R: Negative (pain with release of FABER on R;  L: Negative   Passive Accessory Intervertebral  Motion Pain at restriction L3-5 CPA, moderate hypomobility of lower lumbar spine.      TODAY'S TREATMENT: DATE: 01/08/2024   SUBJECTIVE STATEMENT:   Patient reports doing better than he had anticipated for doing brakes/tires mini-mesters for his automotive class. He reports minimal pain this AM. He reports 75-80% global rating of function at this time. Patient reports some intermittent pain with repetitive bending and sustained flexion at work it trainer). Pt reports working on press-ups 2x/day and completing other stretches every other day.     Therapeutic Exercise - for improved soft tissue flexibility and extensibility as needed for ROM, improved strength as  needed to improve performance of CKC activities/functional movements, MDT/repeated movement for symptom modulation  NuStep; Level 5, x 5 minutes - for improved soft tissue mobility and increased tissue temperature to improve muscle performance   -subjective gathered during this time  AROM Lumbar flexion WNL Lumbar extension WNL (1/10 R flank) Lateral flexion: R WNL , L WNL (1/10 R flank) Thoracolumbar rotation: R WNL , L WNL  Repeated extension in lying, 1 x 10: (mild discomfort right flank during, 1/10), better after   Lower trunk rotations, hooklying; 1 x 10 alt R/L, 3 sec hold   Dying bug with lower extremities hovering; 2 x 5 alternating R/L Dying bug, starting from hooklying; 1 x 10 alternating R/L  Bridge; 2 x 10, with 3 sec isometric hold at top  Prone hip extension; 1 x 10 alt  PATIENT EDUCATION: Discussed current progress made, goals met, and current discharge plan. We reiterated principles of MDT maintenance phase/modifying frequency following month of minimal to no symptoms.     *not today* Open book; 1 x 10 R and L side LTR with Silver physioball; 1 x 15 alt R/L Hooklying pelvic tilt, anterior>posterior; 2 x 10 Supine piriformis stretch; 2 x 30 sec  3 way kick, standing; x 10  ea dir, bilat  -Red Tband around distal shins  Lower trunk rotations; with QL bias; x 10 (LLE crossed to stretch R QL today), 3 sec hold Hooklying lumbar traction, 10-sec intermittent holds; x 6 minutes, PT at foot of treatment table     PATIENT EDUCATION:  Education details: see above for patient education details Person educated: Patient Education method: Explanation, Demonstration, and Handouts Education comprehension: verbalized understanding and returned demonstration   HOME EXERCISE PROGRAM:  Access Code: [email or web address] URL: [email or web address] Date: 01/08/2024 Prepared by: Venetia Endo  Exercises - Prone Press Up  - 5-6 x daily - 7 x weekly - 1 sets - 10 reps - 1sec hold - Supine Quadratus Lumborum Stretch  - 2 x daily - 7 x weekly - 2 sets - 10 reps - 2-3sec hold - Seated Hamstring Stretch  - 2 x daily - 7 x weekly - 3 sets - 30sec hold - Supine Piriformis Stretch with Foot on Ground  - 2 x daily - 7 x weekly - 3 sets - 30sec hold - Supine Bridge  - 1 x daily - 4 x weekly - 2 sets - 10-15 reps - 3-5sec hold - Supine Dead Bug with Leg Extension  - 1 x daily - 4 x weekly - 2 sets - 10 reps - Sidelying Thoracic Rotation with Open Book  - 1 x daily - 4 x weekly - 2 sets - 10 reps - Prone Hip Extension  - 1 x daily - 4 x weekly - 2 sets - 10 reps   ASSESSMENT:  CLINICAL IMPRESSION: Patient has met or mostly met established PT goals and is ready for discharge to maintenance program at this time. We reviewed maintenance MDT parameters and advised continued HEP. We reviewed home exercise sets/reps and frequency, and pt verbalizes understanding for these factors.  Pt fortunately has maintained progress well and has continued with repeated extension at home as well as trunk stabilization/strengthening drills given to date. Pt is appropriate for transition to home-based program at this time. We will discharge this episode of care following expiration of current  referral/certification. Pt was informed that he may return for re-assessment prn until then.   OBJECTIVE IMPAIRMENTS: Abnormal gait, decreased mobility, decreased ROM, decreased  strength, hypomobility, impaired flexibility, postural dysfunction, and pain.   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   REHAB POTENTIAL: Good  CLINICAL DECISION MAKING: Evolving/moderate complexity  EVALUATION COMPLEXITY: Moderate   GOALS: Goals reviewed with patient? Yes  SHORT TERM GOALS: Target date: 09/12/2023  Pt will be independent with HEP in order to improve strength and decrease back pain to improve pain-free function at home and work. Baseline: 08/21/23: Baseline HEP initiated.     09/26/23: Pt mostly compliant with HEP, some missed days or decreased frequency with his schedule.   11/04/23: Pt is compliant with HEP.  Goal status: ACHIEVED   LONG TERM GOALS: Target date: 10/10/2023  Patient will have full thoracolumbar AROM without reproduction of pain as needed for reaching items on ground, household chores, bending Baseline: 08/21/23: Motion loss and pain with flexion, motion loss with extension, bilateral lateral flexion, L rotation.    09/26/23: Motion loss and pain with flexion and extension. No notable motion loss or pain in frontal or transverse plane.  11/04/23: Patient has pain with return to neutral from extension and mild motion loss with flexion  11/27/23: Mostly WFL ROM, pain with flexion/extension end-range 12/10/23: Pain with extension end-range.  01/08/24: WFL AROM, only intermittent twinge with extension  that improves with successive repetitions.  Goal status: MOSTLY MET   2.  Pt will decrease worst back pain by at least 2 points on the NPRS in order to demonstrate clinically significant reduction in back pain. Baseline: 08/21/23: 9/10    09/26/23: 8/10 at worst over previous week 11/04/23: 2/10 at worst in low back.  Goal status: ACHIEVED  3.  Pt will decrease mODI score by at least 13 points in order demonstrate clinically significant reduction in back pain/disability.       Baseline: 08/21/23: 10/50 = 20%      09/26/23: 11/50 = 22% 11/04/23: 6/50 = 12% 11/27/23: 3/50 = 6% Goal status: ACHIEVED   4.  Pt will tolerate sitting for leisure activity and traveling up to 1 hour without reproduction of pain.  Baseline: 08/21/23: Pain with sitting > 1-2 hr.    09/26/23: Up to 2 hours.  Goal status: ACHIEVED    PLAN: PT FREQUENCY: -  PT DURATION: -  PLANNED INTERVENTIONS: Therapeutic exercises, Therapeutic activity, Neuromuscular re-education, Balance training, Gait training, Patient/Family education, Self Care, Joint mobilization, Joint manipulation, Vestibular training, Canalith repositioning, Orthotic/Fit training, DME instructions, Dry Needling, Electrical stimulation, Spinal manipulation, Spinal mobilization, Cryotherapy, Moist heat, Taping, Traction, Ultrasound, Ionotophoresis 4mg /ml Dexamethasone, Manual therapy, and Re-evaluation.  PLAN FOR NEXT SESSION: Pt to continue with maintenance MDT/advanced home program at this time. Pt may follow-up with clinic as needed if he experiences regression in condition; pt would need new referral following expiration of current PT referral/certification.    Venetia Endo, PT, DPT #[id number]  Venetia ONEIDA Endo, PT 01/08/2024, 10:28 AM

## 2024-01-10 NOTE — Telephone Encounter (Signed)
 Duplicate request.  Requested Prescriptions  Pending Prescriptions Disp Refills   fluticasone  (FLONASE ) 50 MCG/ACT nasal spray [Pharmacy Med Name: FLUTICASONE  NASAL SP (120) RX] 48 g     Sig: SHAKE LIQUID AND USE 1 SPRAY IN EACH NOSTRIL DAILY     Ear, Nose, and Throat: Nasal Preparations - Corticosteroids Passed - 01/10/2024  1:33 PM      Passed - Valid encounter within last 12 months    Recent Outpatient Visits           3 days ago Essential (primary) hypertension   [facility] Kotturi, Vinay K, MD   6 months ago Essential (primary) hypertension   [place] [facility] Joshua Cathryne [name], MD       Future Appointments             In 7 months Franc[place]a, Redell [name], MD [facility] [place]

## 2024-02-07 ENCOUNTER — Other Ambulatory Visit: Payer: Self-pay | Admitting: Family Medicine

## 2024-02-07 DIAGNOSIS — J301 Allergic rhinitis due to pollen: Secondary | ICD-10-CM

## 2024-02-10 NOTE — Telephone Encounter (Signed)
 Requested Prescriptions  Pending Prescriptions Disp Refills   fluticasone  (FLONASE ) 50 MCG/ACT nasal spray [Pharmacy Med Name: FLUTICASONE  NASAL SP (120) RX] 48 g 0    Sig: SHAKE LIQUID AND USE 1 SPRAY IN EACH NOSTRIL DAILY     Ear, Nose, and Throat: Nasal Preparations - Corticosteroids Passed - 02/10/2024 12:05 PM      Passed - Valid encounter within last 12 months    Recent Outpatient Visits           1 month ago Essential (primary) hypertension   [place] [facility] Kotturi, Vinay K, Tyler Glover   7 months ago Essential (primary) hypertension   [place] [facility] Joshua Cathryne [name], Tyler Glover       Future Appointments             In 6 months Tyler Glover, Tyler Glover [name], Tyler Glover Tyler Glover

## 2024-04-14 ENCOUNTER — Ambulatory Visit

## 2024-04-16 ENCOUNTER — Ambulatory Visit

## 2024-04-20 ENCOUNTER — Ambulatory Visit: Admitting: Family Medicine

## 2024-04-22 ENCOUNTER — Ambulatory Visit

## 2024-04-23 ENCOUNTER — Ambulatory Visit

## 2024-04-28 ENCOUNTER — Ambulatory Visit: Attending: Rheumatology

## 2024-04-30 ENCOUNTER — Ambulatory Visit

## 2024-05-05 ENCOUNTER — Ambulatory Visit

## 2024-05-07 ENCOUNTER — Ambulatory Visit

## 2024-05-12 ENCOUNTER — Ambulatory Visit

## 2024-05-14 ENCOUNTER — Ambulatory Visit: Attending: Rheumatology

## 2024-05-19 ENCOUNTER — Ambulatory Visit

## 2024-05-21 ENCOUNTER — Ambulatory Visit

## 2024-05-26 ENCOUNTER — Ambulatory Visit

## 2024-05-28 ENCOUNTER — Ambulatory Visit

## 2024-06-02 ENCOUNTER — Ambulatory Visit

## 2024-06-04 ENCOUNTER — Ambulatory Visit

## 2024-06-09 ENCOUNTER — Ambulatory Visit

## 2024-06-11 ENCOUNTER — Ambulatory Visit

## 2024-06-16 ENCOUNTER — Ambulatory Visit: Attending: Rheumatology

## 2024-06-18 ENCOUNTER — Ambulatory Visit

## 2024-06-23 ENCOUNTER — Ambulatory Visit

## 2024-06-25 ENCOUNTER — Ambulatory Visit

## 2024-08-27 ENCOUNTER — Ambulatory Visit: Admitting: Urology
